# Patient Record
Sex: Male | Born: 1950
Health system: Southern US, Community
[De-identification: ages and names within clinical notes are randomized; demographics above are authoritative.]

## PROBLEM LIST (undated history)

## (undated) DIAGNOSIS — I25110 Atherosclerotic heart disease of native coronary artery with unstable angina pectoris: Secondary | ICD-10-CM

## (undated) DIAGNOSIS — E119 Type 2 diabetes mellitus without complications: Secondary | ICD-10-CM

## (undated) DIAGNOSIS — Z9289 Personal history of other medical treatment: Secondary | ICD-10-CM

## (undated) DIAGNOSIS — E782 Mixed hyperlipidemia: Secondary | ICD-10-CM

## (undated) DIAGNOSIS — E034 Atrophy of thyroid (acquired): Secondary | ICD-10-CM

## (undated) DIAGNOSIS — Z87448 Personal history of other diseases of urinary system: Secondary | ICD-10-CM

## (undated) DIAGNOSIS — F32A Depression, unspecified: Secondary | ICD-10-CM

## (undated) DIAGNOSIS — E039 Hypothyroidism, unspecified: Secondary | ICD-10-CM

## (undated) DIAGNOSIS — M1990 Unspecified osteoarthritis, unspecified site: Secondary | ICD-10-CM

## (undated) DIAGNOSIS — E1122 Type 2 diabetes mellitus with diabetic chronic kidney disease: Secondary | ICD-10-CM

## (undated) DIAGNOSIS — I1311 Hypertensive heart and chronic kidney disease without heart failure, with stage 5 chronic kidney disease, or end stage renal disease: Secondary | ICD-10-CM

## (undated) DIAGNOSIS — Z794 Long term (current) use of insulin: Secondary | ICD-10-CM

## (undated) DIAGNOSIS — C801 Malignant (primary) neoplasm, unspecified: Secondary | ICD-10-CM

## (undated) DIAGNOSIS — Z940 Kidney transplant status: Secondary | ICD-10-CM

## (undated) HISTORY — DX: Long term (current) use of insulin: Z79.4

## (undated) HISTORY — DX: Hypertensive heart and chronic kidney disease without heart failure, with stage 5 chronic kidney disease, or end stage renal disease: I13.11

## (undated) HISTORY — DX: Mixed hyperlipidemia: E78.2

## (undated) HISTORY — PX: KIDNEY TRANSPLANT: SHX239

## (undated) HISTORY — PX: CATARACT EXTRACTION: SUR2

## (undated) HISTORY — DX: Atherosclerotic heart disease of native coronary artery with unstable angina pectoris: I25.110

## (undated) HISTORY — PX: CORONARY ARTERY BYPASS GRAFT: SHX141

---
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
## 2010-10-09 DIAGNOSIS — Z85828 Personal history of other malignant neoplasm of skin: Secondary | ICD-10-CM

## 2010-10-09 HISTORY — DX: Personal history of other malignant neoplasm of skin: Z85.828

## 2010-11-16 DIAGNOSIS — C44221 Squamous cell carcinoma of skin of unspecified ear and external auricular canal: Secondary | ICD-10-CM

## 2010-11-16 HISTORY — DX: Squamous cell carcinoma of skin of unspecified ear and external auricular canal: C44.221

## 2015-05-24 DIAGNOSIS — I1311 Hypertensive heart and chronic kidney disease without heart failure, with stage 5 chronic kidney disease, or end stage renal disease: Secondary | ICD-10-CM | POA: Diagnosis not present

## 2015-05-24 DIAGNOSIS — Z940 Kidney transplant status: Secondary | ICD-10-CM | POA: Diagnosis not present

## 2015-05-24 DIAGNOSIS — E034 Atrophy of thyroid (acquired): Secondary | ICD-10-CM | POA: Diagnosis not present

## 2015-05-24 DIAGNOSIS — E782 Mixed hyperlipidemia: Secondary | ICD-10-CM | POA: Diagnosis not present

## 2015-05-24 DIAGNOSIS — E1122 Type 2 diabetes mellitus with diabetic chronic kidney disease: Secondary | ICD-10-CM | POA: Diagnosis not present

## 2015-05-24 DIAGNOSIS — N186 End stage renal disease: Secondary | ICD-10-CM | POA: Diagnosis not present

## 2015-05-24 DIAGNOSIS — Z794 Long term (current) use of insulin: Secondary | ICD-10-CM | POA: Diagnosis not present

## 2015-05-24 DIAGNOSIS — I25110 Atherosclerotic heart disease of native coronary artery with unstable angina pectoris: Secondary | ICD-10-CM | POA: Diagnosis not present

## 2015-06-22 DIAGNOSIS — N186 End stage renal disease: Secondary | ICD-10-CM | POA: Diagnosis not present

## 2015-06-22 DIAGNOSIS — D649 Anemia, unspecified: Secondary | ICD-10-CM | POA: Diagnosis not present

## 2015-06-22 DIAGNOSIS — Z79899 Other long term (current) drug therapy: Secondary | ICD-10-CM | POA: Diagnosis not present

## 2015-06-22 DIAGNOSIS — N250 Renal osteodystrophy: Secondary | ICD-10-CM | POA: Diagnosis not present

## 2015-06-22 DIAGNOSIS — Z940 Kidney transplant status: Secondary | ICD-10-CM | POA: Diagnosis not present

## 2015-06-22 DIAGNOSIS — R309 Painful micturition, unspecified: Secondary | ICD-10-CM | POA: Diagnosis not present

## 2015-06-22 DIAGNOSIS — E872 Acidosis: Secondary | ICD-10-CM | POA: Diagnosis not present

## 2015-06-22 DIAGNOSIS — E211 Secondary hyperparathyroidism, not elsewhere classified: Secondary | ICD-10-CM | POA: Diagnosis not present

## 2015-06-22 DIAGNOSIS — E785 Hyperlipidemia, unspecified: Secondary | ICD-10-CM | POA: Diagnosis not present

## 2015-06-22 DIAGNOSIS — E1169 Type 2 diabetes mellitus with other specified complication: Secondary | ICD-10-CM | POA: Diagnosis not present

## 2015-06-22 DIAGNOSIS — R809 Proteinuria, unspecified: Secondary | ICD-10-CM | POA: Diagnosis not present

## 2015-10-24 DIAGNOSIS — E1169 Type 2 diabetes mellitus with other specified complication: Secondary | ICD-10-CM | POA: Diagnosis not present

## 2015-10-24 DIAGNOSIS — E211 Secondary hyperparathyroidism, not elsewhere classified: Secondary | ICD-10-CM | POA: Diagnosis not present

## 2015-10-24 DIAGNOSIS — N186 End stage renal disease: Secondary | ICD-10-CM | POA: Diagnosis not present

## 2015-10-24 DIAGNOSIS — E785 Hyperlipidemia, unspecified: Secondary | ICD-10-CM | POA: Diagnosis not present

## 2015-10-24 DIAGNOSIS — D649 Anemia, unspecified: Secondary | ICD-10-CM | POA: Diagnosis not present

## 2015-10-24 DIAGNOSIS — E559 Vitamin D deficiency, unspecified: Secondary | ICD-10-CM | POA: Diagnosis not present

## 2015-10-24 DIAGNOSIS — E872 Acidosis: Secondary | ICD-10-CM | POA: Diagnosis not present

## 2015-10-24 DIAGNOSIS — R809 Proteinuria, unspecified: Secondary | ICD-10-CM | POA: Diagnosis not present

## 2015-10-24 DIAGNOSIS — N189 Chronic kidney disease, unspecified: Secondary | ICD-10-CM | POA: Diagnosis not present

## 2015-10-24 DIAGNOSIS — Z940 Kidney transplant status: Secondary | ICD-10-CM | POA: Diagnosis not present

## 2015-10-24 DIAGNOSIS — E119 Type 2 diabetes mellitus without complications: Secondary | ICD-10-CM | POA: Diagnosis not present

## 2015-10-24 DIAGNOSIS — N250 Renal osteodystrophy: Secondary | ICD-10-CM | POA: Diagnosis not present

## 2015-11-24 DIAGNOSIS — I1311 Hypertensive heart and chronic kidney disease without heart failure, with stage 5 chronic kidney disease, or end stage renal disease: Secondary | ICD-10-CM | POA: Diagnosis not present

## 2015-11-24 DIAGNOSIS — I25110 Atherosclerotic heart disease of native coronary artery with unstable angina pectoris: Secondary | ICD-10-CM | POA: Diagnosis not present

## 2015-11-24 DIAGNOSIS — Z940 Kidney transplant status: Secondary | ICD-10-CM | POA: Diagnosis not present

## 2015-11-24 DIAGNOSIS — N186 End stage renal disease: Secondary | ICD-10-CM | POA: Diagnosis not present

## 2015-11-24 DIAGNOSIS — Z794 Long term (current) use of insulin: Secondary | ICD-10-CM | POA: Diagnosis not present

## 2015-11-24 DIAGNOSIS — E1122 Type 2 diabetes mellitus with diabetic chronic kidney disease: Secondary | ICD-10-CM | POA: Diagnosis not present

## 2015-11-24 DIAGNOSIS — E782 Mixed hyperlipidemia: Secondary | ICD-10-CM | POA: Diagnosis not present

## 2015-11-24 DIAGNOSIS — E034 Atrophy of thyroid (acquired): Secondary | ICD-10-CM | POA: Diagnosis not present

## 2015-12-08 DIAGNOSIS — L821 Other seborrheic keratosis: Secondary | ICD-10-CM | POA: Diagnosis not present

## 2015-12-08 DIAGNOSIS — L578 Other skin changes due to chronic exposure to nonionizing radiation: Secondary | ICD-10-CM | POA: Diagnosis not present

## 2015-12-08 DIAGNOSIS — L570 Actinic keratosis: Secondary | ICD-10-CM | POA: Diagnosis not present

## 2015-12-08 DIAGNOSIS — C44622 Squamous cell carcinoma of skin of right upper limb, including shoulder: Secondary | ICD-10-CM | POA: Diagnosis not present

## 2016-01-31 DIAGNOSIS — Q605 Renal hypoplasia, unspecified: Secondary | ICD-10-CM

## 2016-01-31 HISTORY — DX: Renal hypoplasia, unspecified: Q60.5

## 2016-02-08 DIAGNOSIS — Z951 Presence of aortocoronary bypass graft: Secondary | ICD-10-CM | POA: Insufficient documentation

## 2016-02-08 DIAGNOSIS — F17200 Nicotine dependence, unspecified, uncomplicated: Secondary | ICD-10-CM | POA: Insufficient documentation

## 2016-02-08 DIAGNOSIS — E1169 Type 2 diabetes mellitus with other specified complication: Secondary | ICD-10-CM | POA: Diagnosis not present

## 2016-02-08 DIAGNOSIS — E039 Hypothyroidism, unspecified: Secondary | ICD-10-CM | POA: Diagnosis not present

## 2016-02-08 DIAGNOSIS — Z940 Kidney transplant status: Secondary | ICD-10-CM | POA: Diagnosis not present

## 2016-02-08 DIAGNOSIS — E559 Vitamin D deficiency, unspecified: Secondary | ICD-10-CM

## 2016-02-08 DIAGNOSIS — E099 Drug or chemical induced diabetes mellitus without complications: Secondary | ICD-10-CM | POA: Diagnosis not present

## 2016-02-08 DIAGNOSIS — E785 Hyperlipidemia, unspecified: Secondary | ICD-10-CM | POA: Diagnosis not present

## 2016-02-08 DIAGNOSIS — I2510 Atherosclerotic heart disease of native coronary artery without angina pectoris: Secondary | ICD-10-CM | POA: Insufficient documentation

## 2016-02-08 HISTORY — DX: Presence of aortocoronary bypass graft: Z95.1

## 2016-02-08 HISTORY — DX: Atherosclerotic heart disease of native coronary artery without angina pectoris: I25.10

## 2016-02-08 HISTORY — DX: Vitamin D deficiency, unspecified: E55.9

## 2016-02-27 DIAGNOSIS — E785 Hyperlipidemia, unspecified: Secondary | ICD-10-CM | POA: Diagnosis not present

## 2016-02-27 DIAGNOSIS — E7800 Pure hypercholesterolemia, unspecified: Secondary | ICD-10-CM | POA: Diagnosis not present

## 2016-02-27 DIAGNOSIS — Z940 Kidney transplant status: Secondary | ICD-10-CM | POA: Diagnosis not present

## 2016-02-27 DIAGNOSIS — E1169 Type 2 diabetes mellitus with other specified complication: Secondary | ICD-10-CM | POA: Diagnosis not present

## 2016-02-27 DIAGNOSIS — N186 End stage renal disease: Secondary | ICD-10-CM | POA: Diagnosis not present

## 2016-02-27 DIAGNOSIS — Z79899 Other long term (current) drug therapy: Secondary | ICD-10-CM | POA: Diagnosis not present

## 2016-02-27 DIAGNOSIS — E872 Acidosis: Secondary | ICD-10-CM | POA: Diagnosis not present

## 2016-02-27 DIAGNOSIS — D649 Anemia, unspecified: Secondary | ICD-10-CM | POA: Diagnosis not present

## 2016-02-27 DIAGNOSIS — N250 Renal osteodystrophy: Secondary | ICD-10-CM | POA: Diagnosis not present

## 2016-02-27 DIAGNOSIS — E559 Vitamin D deficiency, unspecified: Secondary | ICD-10-CM | POA: Diagnosis not present

## 2016-02-27 DIAGNOSIS — N189 Chronic kidney disease, unspecified: Secondary | ICD-10-CM | POA: Diagnosis not present

## 2016-02-27 DIAGNOSIS — R809 Proteinuria, unspecified: Secondary | ICD-10-CM | POA: Diagnosis not present

## 2016-02-27 DIAGNOSIS — R309 Painful micturition, unspecified: Secondary | ICD-10-CM | POA: Diagnosis not present

## 2016-03-28 DIAGNOSIS — Z9225 Personal history of immunosupression therapy: Secondary | ICD-10-CM | POA: Diagnosis not present

## 2016-03-28 DIAGNOSIS — Z79899 Other long term (current) drug therapy: Secondary | ICD-10-CM | POA: Diagnosis not present

## 2016-03-28 DIAGNOSIS — L578 Other skin changes due to chronic exposure to nonionizing radiation: Secondary | ICD-10-CM | POA: Diagnosis not present

## 2016-03-28 DIAGNOSIS — L570 Actinic keratosis: Secondary | ICD-10-CM | POA: Diagnosis not present

## 2016-03-28 DIAGNOSIS — L905 Scar conditions and fibrosis of skin: Secondary | ICD-10-CM | POA: Diagnosis not present

## 2016-05-23 DIAGNOSIS — E782 Mixed hyperlipidemia: Secondary | ICD-10-CM | POA: Diagnosis not present

## 2016-05-23 DIAGNOSIS — N186 End stage renal disease: Secondary | ICD-10-CM | POA: Diagnosis not present

## 2016-05-23 DIAGNOSIS — E034 Atrophy of thyroid (acquired): Secondary | ICD-10-CM | POA: Diagnosis not present

## 2016-05-23 DIAGNOSIS — E1122 Type 2 diabetes mellitus with diabetic chronic kidney disease: Secondary | ICD-10-CM | POA: Diagnosis not present

## 2016-05-23 DIAGNOSIS — I1311 Hypertensive heart and chronic kidney disease without heart failure, with stage 5 chronic kidney disease, or end stage renal disease: Secondary | ICD-10-CM | POA: Diagnosis not present

## 2016-05-23 DIAGNOSIS — I25110 Atherosclerotic heart disease of native coronary artery with unstable angina pectoris: Secondary | ICD-10-CM | POA: Diagnosis not present

## 2016-05-23 DIAGNOSIS — Z794 Long term (current) use of insulin: Secondary | ICD-10-CM | POA: Diagnosis not present

## 2016-05-23 DIAGNOSIS — Z940 Kidney transplant status: Secondary | ICD-10-CM | POA: Diagnosis not present

## 2016-06-11 DIAGNOSIS — I2510 Atherosclerotic heart disease of native coronary artery without angina pectoris: Secondary | ICD-10-CM | POA: Diagnosis not present

## 2016-06-11 DIAGNOSIS — E039 Hypothyroidism, unspecified: Secondary | ICD-10-CM | POA: Diagnosis not present

## 2016-06-11 DIAGNOSIS — Z951 Presence of aortocoronary bypass graft: Secondary | ICD-10-CM | POA: Diagnosis not present

## 2016-06-11 DIAGNOSIS — Z940 Kidney transplant status: Secondary | ICD-10-CM | POA: Diagnosis not present

## 2016-06-11 DIAGNOSIS — E785 Hyperlipidemia, unspecified: Secondary | ICD-10-CM | POA: Diagnosis not present

## 2016-06-11 DIAGNOSIS — E1169 Type 2 diabetes mellitus with other specified complication: Secondary | ICD-10-CM | POA: Diagnosis not present

## 2016-06-11 DIAGNOSIS — F17200 Nicotine dependence, unspecified, uncomplicated: Secondary | ICD-10-CM | POA: Diagnosis not present

## 2016-06-11 DIAGNOSIS — E099 Drug or chemical induced diabetes mellitus without complications: Secondary | ICD-10-CM | POA: Diagnosis not present

## 2016-07-02 DIAGNOSIS — R309 Painful micturition, unspecified: Secondary | ICD-10-CM | POA: Diagnosis not present

## 2016-07-02 DIAGNOSIS — Z79899 Other long term (current) drug therapy: Secondary | ICD-10-CM | POA: Diagnosis not present

## 2016-07-02 DIAGNOSIS — E785 Hyperlipidemia, unspecified: Secondary | ICD-10-CM | POA: Diagnosis not present

## 2016-07-02 DIAGNOSIS — R809 Proteinuria, unspecified: Secondary | ICD-10-CM | POA: Diagnosis not present

## 2016-07-02 DIAGNOSIS — N250 Renal osteodystrophy: Secondary | ICD-10-CM | POA: Diagnosis not present

## 2016-07-02 DIAGNOSIS — N186 End stage renal disease: Secondary | ICD-10-CM | POA: Diagnosis not present

## 2016-07-02 DIAGNOSIS — Z940 Kidney transplant status: Secondary | ICD-10-CM | POA: Diagnosis not present

## 2016-07-02 DIAGNOSIS — E559 Vitamin D deficiency, unspecified: Secondary | ICD-10-CM | POA: Diagnosis not present

## 2016-07-02 DIAGNOSIS — E1169 Type 2 diabetes mellitus with other specified complication: Secondary | ICD-10-CM | POA: Diagnosis not present

## 2016-07-02 DIAGNOSIS — D649 Anemia, unspecified: Secondary | ICD-10-CM | POA: Diagnosis not present

## 2016-07-02 DIAGNOSIS — E872 Acidosis: Secondary | ICD-10-CM | POA: Diagnosis not present

## 2016-07-30 DIAGNOSIS — Z9225 Personal history of immunosupression therapy: Secondary | ICD-10-CM | POA: Diagnosis not present

## 2016-07-30 DIAGNOSIS — L578 Other skin changes due to chronic exposure to nonionizing radiation: Secondary | ICD-10-CM | POA: Diagnosis not present

## 2016-07-30 DIAGNOSIS — Z85828 Personal history of other malignant neoplasm of skin: Secondary | ICD-10-CM | POA: Diagnosis not present

## 2016-07-30 DIAGNOSIS — L570 Actinic keratosis: Secondary | ICD-10-CM | POA: Diagnosis not present

## 2016-09-10 DIAGNOSIS — L570 Actinic keratosis: Secondary | ICD-10-CM | POA: Diagnosis not present

## 2016-10-15 DIAGNOSIS — Z85828 Personal history of other malignant neoplasm of skin: Secondary | ICD-10-CM | POA: Diagnosis not present

## 2016-10-15 DIAGNOSIS — Z940 Kidney transplant status: Secondary | ICD-10-CM | POA: Diagnosis not present

## 2016-10-15 DIAGNOSIS — E039 Hypothyroidism, unspecified: Secondary | ICD-10-CM | POA: Diagnosis not present

## 2016-10-15 DIAGNOSIS — L574 Cutis laxa senilis: Secondary | ICD-10-CM | POA: Diagnosis not present

## 2016-10-15 DIAGNOSIS — I2510 Atherosclerotic heart disease of native coronary artery without angina pectoris: Secondary | ICD-10-CM | POA: Diagnosis not present

## 2016-10-15 DIAGNOSIS — F17200 Nicotine dependence, unspecified, uncomplicated: Secondary | ICD-10-CM | POA: Diagnosis not present

## 2016-10-15 DIAGNOSIS — E785 Hyperlipidemia, unspecified: Secondary | ICD-10-CM | POA: Diagnosis not present

## 2016-10-15 DIAGNOSIS — E1169 Type 2 diabetes mellitus with other specified complication: Secondary | ICD-10-CM | POA: Diagnosis not present

## 2016-10-15 DIAGNOSIS — E1165 Type 2 diabetes mellitus with hyperglycemia: Secondary | ICD-10-CM | POA: Diagnosis not present

## 2016-10-15 DIAGNOSIS — Z08 Encounter for follow-up examination after completed treatment for malignant neoplasm: Secondary | ICD-10-CM | POA: Diagnosis not present

## 2016-10-15 DIAGNOSIS — L905 Scar conditions and fibrosis of skin: Secondary | ICD-10-CM | POA: Diagnosis not present

## 2016-11-25 DIAGNOSIS — I1311 Hypertensive heart and chronic kidney disease without heart failure, with stage 5 chronic kidney disease, or end stage renal disease: Secondary | ICD-10-CM | POA: Diagnosis not present

## 2016-11-25 DIAGNOSIS — Z940 Kidney transplant status: Secondary | ICD-10-CM | POA: Diagnosis not present

## 2016-11-25 DIAGNOSIS — I25110 Atherosclerotic heart disease of native coronary artery with unstable angina pectoris: Secondary | ICD-10-CM | POA: Diagnosis not present

## 2016-11-25 DIAGNOSIS — N186 End stage renal disease: Secondary | ICD-10-CM | POA: Diagnosis not present

## 2016-11-25 DIAGNOSIS — Z794 Long term (current) use of insulin: Secondary | ICD-10-CM | POA: Diagnosis not present

## 2016-11-25 DIAGNOSIS — E1122 Type 2 diabetes mellitus with diabetic chronic kidney disease: Secondary | ICD-10-CM | POA: Diagnosis not present

## 2016-11-25 DIAGNOSIS — E034 Atrophy of thyroid (acquired): Secondary | ICD-10-CM | POA: Diagnosis not present

## 2016-11-25 DIAGNOSIS — E782 Mixed hyperlipidemia: Secondary | ICD-10-CM | POA: Diagnosis not present

## 2016-12-17 DIAGNOSIS — E1169 Type 2 diabetes mellitus with other specified complication: Secondary | ICD-10-CM | POA: Diagnosis not present

## 2016-12-17 DIAGNOSIS — Z940 Kidney transplant status: Secondary | ICD-10-CM | POA: Diagnosis not present

## 2016-12-17 DIAGNOSIS — N186 End stage renal disease: Secondary | ICD-10-CM | POA: Diagnosis not present

## 2016-12-17 DIAGNOSIS — E872 Acidosis: Secondary | ICD-10-CM | POA: Diagnosis not present

## 2016-12-17 DIAGNOSIS — N250 Renal osteodystrophy: Secondary | ICD-10-CM | POA: Diagnosis not present

## 2016-12-17 DIAGNOSIS — E785 Hyperlipidemia, unspecified: Secondary | ICD-10-CM | POA: Diagnosis not present

## 2017-01-06 DIAGNOSIS — Z0000 Encounter for general adult medical examination without abnormal findings: Secondary | ICD-10-CM | POA: Diagnosis not present

## 2017-01-06 DIAGNOSIS — Z6828 Body mass index (BMI) 28.0-28.9, adult: Secondary | ICD-10-CM | POA: Diagnosis not present

## 2017-01-13 DIAGNOSIS — Z1211 Encounter for screening for malignant neoplasm of colon: Secondary | ICD-10-CM | POA: Diagnosis not present

## 2017-01-13 DIAGNOSIS — Z1212 Encounter for screening for malignant neoplasm of rectum: Secondary | ICD-10-CM | POA: Diagnosis not present

## 2017-01-14 DIAGNOSIS — F17200 Nicotine dependence, unspecified, uncomplicated: Secondary | ICD-10-CM | POA: Diagnosis not present

## 2017-01-14 DIAGNOSIS — I2510 Atherosclerotic heart disease of native coronary artery without angina pectoris: Secondary | ICD-10-CM | POA: Diagnosis not present

## 2017-01-14 DIAGNOSIS — C44629 Squamous cell carcinoma of skin of left upper limb, including shoulder: Secondary | ICD-10-CM | POA: Diagnosis not present

## 2017-01-14 DIAGNOSIS — L578 Other skin changes due to chronic exposure to nonionizing radiation: Secondary | ICD-10-CM | POA: Diagnosis not present

## 2017-01-14 DIAGNOSIS — E1169 Type 2 diabetes mellitus with other specified complication: Secondary | ICD-10-CM | POA: Diagnosis not present

## 2017-01-14 DIAGNOSIS — Z940 Kidney transplant status: Secondary | ICD-10-CM | POA: Diagnosis not present

## 2017-01-14 DIAGNOSIS — Z951 Presence of aortocoronary bypass graft: Secondary | ICD-10-CM | POA: Diagnosis not present

## 2017-01-14 DIAGNOSIS — E039 Hypothyroidism, unspecified: Secondary | ICD-10-CM | POA: Diagnosis not present

## 2017-01-14 DIAGNOSIS — E1165 Type 2 diabetes mellitus with hyperglycemia: Secondary | ICD-10-CM | POA: Diagnosis not present

## 2017-01-14 DIAGNOSIS — D485 Neoplasm of uncertain behavior of skin: Secondary | ICD-10-CM | POA: Diagnosis not present

## 2017-01-14 DIAGNOSIS — L570 Actinic keratosis: Secondary | ICD-10-CM | POA: Diagnosis not present

## 2017-01-14 DIAGNOSIS — E785 Hyperlipidemia, unspecified: Secondary | ICD-10-CM | POA: Diagnosis not present

## 2017-01-31 DIAGNOSIS — E1165 Type 2 diabetes mellitus with hyperglycemia: Secondary | ICD-10-CM | POA: Diagnosis not present

## 2017-03-12 DIAGNOSIS — E1165 Type 2 diabetes mellitus with hyperglycemia: Secondary | ICD-10-CM | POA: Diagnosis not present

## 2017-04-17 DIAGNOSIS — E1165 Type 2 diabetes mellitus with hyperglycemia: Secondary | ICD-10-CM | POA: Diagnosis not present

## 2017-04-17 DIAGNOSIS — E1169 Type 2 diabetes mellitus with other specified complication: Secondary | ICD-10-CM | POA: Diagnosis not present

## 2017-04-17 DIAGNOSIS — E785 Hyperlipidemia, unspecified: Secondary | ICD-10-CM | POA: Diagnosis not present

## 2017-04-17 DIAGNOSIS — Z940 Kidney transplant status: Secondary | ICD-10-CM | POA: Diagnosis not present

## 2017-04-17 DIAGNOSIS — E039 Hypothyroidism, unspecified: Secondary | ICD-10-CM | POA: Diagnosis not present

## 2017-04-17 DIAGNOSIS — I2510 Atherosclerotic heart disease of native coronary artery without angina pectoris: Secondary | ICD-10-CM | POA: Diagnosis not present

## 2017-04-17 DIAGNOSIS — F17200 Nicotine dependence, unspecified, uncomplicated: Secondary | ICD-10-CM | POA: Diagnosis not present

## 2017-05-26 DIAGNOSIS — E034 Atrophy of thyroid (acquired): Secondary | ICD-10-CM | POA: Diagnosis not present

## 2017-05-26 DIAGNOSIS — Z940 Kidney transplant status: Secondary | ICD-10-CM | POA: Diagnosis not present

## 2017-05-26 DIAGNOSIS — E782 Mixed hyperlipidemia: Secondary | ICD-10-CM | POA: Diagnosis not present

## 2017-05-26 DIAGNOSIS — I1311 Hypertensive heart and chronic kidney disease without heart failure, with stage 5 chronic kidney disease, or end stage renal disease: Secondary | ICD-10-CM | POA: Diagnosis not present

## 2017-05-26 DIAGNOSIS — I25110 Atherosclerotic heart disease of native coronary artery with unstable angina pectoris: Secondary | ICD-10-CM | POA: Diagnosis not present

## 2017-05-26 DIAGNOSIS — E1122 Type 2 diabetes mellitus with diabetic chronic kidney disease: Secondary | ICD-10-CM | POA: Diagnosis not present

## 2017-05-26 DIAGNOSIS — Z125 Encounter for screening for malignant neoplasm of prostate: Secondary | ICD-10-CM | POA: Diagnosis not present

## 2017-05-26 DIAGNOSIS — Z794 Long term (current) use of insulin: Secondary | ICD-10-CM | POA: Diagnosis not present

## 2017-05-26 DIAGNOSIS — N186 End stage renal disease: Secondary | ICD-10-CM | POA: Diagnosis not present

## 2017-07-08 DIAGNOSIS — Z940 Kidney transplant status: Secondary | ICD-10-CM

## 2017-07-08 DIAGNOSIS — R079 Chest pain, unspecified: Secondary | ICD-10-CM | POA: Diagnosis not present

## 2017-07-08 HISTORY — DX: Kidney transplant status: Z94.0

## 2017-07-16 DIAGNOSIS — E211 Secondary hyperparathyroidism, not elsewhere classified: Secondary | ICD-10-CM | POA: Diagnosis not present

## 2017-07-16 DIAGNOSIS — E872 Acidosis: Secondary | ICD-10-CM | POA: Diagnosis not present

## 2017-07-16 DIAGNOSIS — D649 Anemia, unspecified: Secondary | ICD-10-CM | POA: Diagnosis not present

## 2017-07-16 DIAGNOSIS — N189 Chronic kidney disease, unspecified: Secondary | ICD-10-CM | POA: Diagnosis not present

## 2017-07-16 DIAGNOSIS — E785 Hyperlipidemia, unspecified: Secondary | ICD-10-CM | POA: Diagnosis not present

## 2017-07-16 DIAGNOSIS — R309 Painful micturition, unspecified: Secondary | ICD-10-CM | POA: Diagnosis not present

## 2017-07-16 DIAGNOSIS — E1169 Type 2 diabetes mellitus with other specified complication: Secondary | ICD-10-CM | POA: Diagnosis not present

## 2017-07-16 DIAGNOSIS — R809 Proteinuria, unspecified: Secondary | ICD-10-CM | POA: Diagnosis not present

## 2017-07-16 DIAGNOSIS — Z940 Kidney transplant status: Secondary | ICD-10-CM | POA: Diagnosis not present

## 2017-07-16 DIAGNOSIS — N250 Renal osteodystrophy: Secondary | ICD-10-CM | POA: Diagnosis not present

## 2017-07-16 DIAGNOSIS — E559 Vitamin D deficiency, unspecified: Secondary | ICD-10-CM | POA: Diagnosis not present

## 2017-07-16 DIAGNOSIS — N186 End stage renal disease: Secondary | ICD-10-CM | POA: Diagnosis not present

## 2017-07-17 DIAGNOSIS — C44629 Squamous cell carcinoma of skin of left upper limb, including shoulder: Secondary | ICD-10-CM | POA: Diagnosis not present

## 2017-07-17 DIAGNOSIS — L570 Actinic keratosis: Secondary | ICD-10-CM | POA: Diagnosis not present

## 2017-07-17 DIAGNOSIS — Z85828 Personal history of other malignant neoplasm of skin: Secondary | ICD-10-CM | POA: Diagnosis not present

## 2017-07-17 DIAGNOSIS — Z08 Encounter for follow-up examination after completed treatment for malignant neoplasm: Secondary | ICD-10-CM | POA: Diagnosis not present

## 2017-07-17 DIAGNOSIS — L578 Other skin changes due to chronic exposure to nonionizing radiation: Secondary | ICD-10-CM | POA: Diagnosis not present

## 2017-07-17 DIAGNOSIS — D0462 Carcinoma in situ of skin of left upper limb, including shoulder: Secondary | ICD-10-CM | POA: Diagnosis not present

## 2017-07-17 DIAGNOSIS — L905 Scar conditions and fibrosis of skin: Secondary | ICD-10-CM | POA: Diagnosis not present

## 2017-08-19 DIAGNOSIS — F17210 Nicotine dependence, cigarettes, uncomplicated: Secondary | ICD-10-CM | POA: Diagnosis not present

## 2017-08-19 DIAGNOSIS — E039 Hypothyroidism, unspecified: Secondary | ICD-10-CM | POA: Diagnosis not present

## 2017-08-19 DIAGNOSIS — E1122 Type 2 diabetes mellitus with diabetic chronic kidney disease: Secondary | ICD-10-CM | POA: Diagnosis not present

## 2017-08-19 DIAGNOSIS — E785 Hyperlipidemia, unspecified: Secondary | ICD-10-CM | POA: Diagnosis not present

## 2017-08-19 DIAGNOSIS — Z95820 Peripheral vascular angioplasty status with implants and grafts: Secondary | ICD-10-CM | POA: Diagnosis not present

## 2017-08-19 DIAGNOSIS — N184 Chronic kidney disease, stage 4 (severe): Secondary | ICD-10-CM | POA: Diagnosis not present

## 2017-08-19 DIAGNOSIS — Z794 Long term (current) use of insulin: Secondary | ICD-10-CM | POA: Diagnosis not present

## 2017-10-17 DIAGNOSIS — C44629 Squamous cell carcinoma of skin of left upper limb, including shoulder: Secondary | ICD-10-CM | POA: Diagnosis not present

## 2017-10-17 DIAGNOSIS — D485 Neoplasm of uncertain behavior of skin: Secondary | ICD-10-CM | POA: Diagnosis not present

## 2017-10-17 DIAGNOSIS — C44529 Squamous cell carcinoma of skin of other part of trunk: Secondary | ICD-10-CM | POA: Diagnosis not present

## 2017-10-17 DIAGNOSIS — Z08 Encounter for follow-up examination after completed treatment for malignant neoplasm: Secondary | ICD-10-CM | POA: Diagnosis not present

## 2017-10-17 DIAGNOSIS — L820 Inflamed seborrheic keratosis: Secondary | ICD-10-CM | POA: Diagnosis not present

## 2017-10-17 DIAGNOSIS — Z859 Personal history of malignant neoplasm, unspecified: Secondary | ICD-10-CM | POA: Diagnosis not present

## 2017-10-17 DIAGNOSIS — L570 Actinic keratosis: Secondary | ICD-10-CM | POA: Diagnosis not present

## 2017-10-30 DIAGNOSIS — D0462 Carcinoma in situ of skin of left upper limb, including shoulder: Secondary | ICD-10-CM | POA: Diagnosis not present

## 2017-10-30 DIAGNOSIS — D045 Carcinoma in situ of skin of trunk: Secondary | ICD-10-CM | POA: Diagnosis not present

## 2017-10-30 DIAGNOSIS — C44529 Squamous cell carcinoma of skin of other part of trunk: Secondary | ICD-10-CM | POA: Diagnosis not present

## 2017-10-30 DIAGNOSIS — C44629 Squamous cell carcinoma of skin of left upper limb, including shoulder: Secondary | ICD-10-CM | POA: Diagnosis not present

## 2017-11-26 DIAGNOSIS — Z940 Kidney transplant status: Secondary | ICD-10-CM | POA: Diagnosis not present

## 2017-11-26 DIAGNOSIS — E782 Mixed hyperlipidemia: Secondary | ICD-10-CM | POA: Diagnosis not present

## 2017-11-26 DIAGNOSIS — M65352 Trigger finger, left little finger: Secondary | ICD-10-CM | POA: Diagnosis not present

## 2017-11-26 DIAGNOSIS — I1311 Hypertensive heart and chronic kidney disease without heart failure, with stage 5 chronic kidney disease, or end stage renal disease: Secondary | ICD-10-CM | POA: Diagnosis not present

## 2017-11-26 DIAGNOSIS — M65342 Trigger finger, left ring finger: Secondary | ICD-10-CM | POA: Diagnosis not present

## 2017-11-26 DIAGNOSIS — N186 End stage renal disease: Secondary | ICD-10-CM | POA: Diagnosis not present

## 2017-11-26 DIAGNOSIS — Z794 Long term (current) use of insulin: Secondary | ICD-10-CM | POA: Diagnosis not present

## 2017-11-26 DIAGNOSIS — E1122 Type 2 diabetes mellitus with diabetic chronic kidney disease: Secondary | ICD-10-CM | POA: Diagnosis not present

## 2017-11-26 DIAGNOSIS — I25110 Atherosclerotic heart disease of native coronary artery with unstable angina pectoris: Secondary | ICD-10-CM | POA: Diagnosis not present

## 2017-11-26 DIAGNOSIS — E034 Atrophy of thyroid (acquired): Secondary | ICD-10-CM | POA: Diagnosis not present

## 2017-12-17 DIAGNOSIS — Z794 Long term (current) use of insulin: Secondary | ICD-10-CM | POA: Diagnosis not present

## 2017-12-17 DIAGNOSIS — E1122 Type 2 diabetes mellitus with diabetic chronic kidney disease: Secondary | ICD-10-CM | POA: Diagnosis not present

## 2017-12-17 DIAGNOSIS — N184 Chronic kidney disease, stage 4 (severe): Secondary | ICD-10-CM | POA: Diagnosis not present

## 2017-12-17 DIAGNOSIS — E039 Hypothyroidism, unspecified: Secondary | ICD-10-CM | POA: Diagnosis not present

## 2017-12-17 DIAGNOSIS — F17210 Nicotine dependence, cigarettes, uncomplicated: Secondary | ICD-10-CM | POA: Diagnosis not present

## 2017-12-17 DIAGNOSIS — Z951 Presence of aortocoronary bypass graft: Secondary | ICD-10-CM | POA: Diagnosis not present

## 2017-12-17 DIAGNOSIS — E785 Hyperlipidemia, unspecified: Secondary | ICD-10-CM | POA: Diagnosis not present

## 2017-12-31 DIAGNOSIS — M65352 Trigger finger, left little finger: Secondary | ICD-10-CM | POA: Diagnosis not present

## 2018-01-15 DIAGNOSIS — E559 Vitamin D deficiency, unspecified: Secondary | ICD-10-CM | POA: Diagnosis not present

## 2018-01-15 DIAGNOSIS — N250 Renal osteodystrophy: Secondary | ICD-10-CM | POA: Diagnosis not present

## 2018-01-15 DIAGNOSIS — E785 Hyperlipidemia, unspecified: Secondary | ICD-10-CM | POA: Diagnosis not present

## 2018-01-15 DIAGNOSIS — N186 End stage renal disease: Secondary | ICD-10-CM | POA: Diagnosis not present

## 2018-01-15 DIAGNOSIS — E211 Secondary hyperparathyroidism, not elsewhere classified: Secondary | ICD-10-CM | POA: Diagnosis not present

## 2018-01-15 DIAGNOSIS — E1169 Type 2 diabetes mellitus with other specified complication: Secondary | ICD-10-CM | POA: Diagnosis not present

## 2018-01-15 DIAGNOSIS — Z79899 Other long term (current) drug therapy: Secondary | ICD-10-CM | POA: Diagnosis not present

## 2018-01-15 DIAGNOSIS — E872 Acidosis: Secondary | ICD-10-CM | POA: Diagnosis not present

## 2018-01-15 DIAGNOSIS — Z940 Kidney transplant status: Secondary | ICD-10-CM | POA: Diagnosis not present

## 2018-03-13 DIAGNOSIS — Z79899 Other long term (current) drug therapy: Secondary | ICD-10-CM | POA: Diagnosis not present

## 2018-03-13 DIAGNOSIS — N186 End stage renal disease: Secondary | ICD-10-CM | POA: Diagnosis not present

## 2018-03-13 DIAGNOSIS — Z940 Kidney transplant status: Secondary | ICD-10-CM | POA: Diagnosis not present

## 2018-03-23 DIAGNOSIS — N189 Chronic kidney disease, unspecified: Secondary | ICD-10-CM | POA: Diagnosis not present

## 2018-03-23 DIAGNOSIS — Z940 Kidney transplant status: Secondary | ICD-10-CM | POA: Diagnosis not present

## 2018-04-29 DIAGNOSIS — N184 Chronic kidney disease, stage 4 (severe): Secondary | ICD-10-CM | POA: Diagnosis not present

## 2018-04-29 DIAGNOSIS — Z940 Kidney transplant status: Secondary | ICD-10-CM | POA: Diagnosis not present

## 2018-04-29 DIAGNOSIS — E559 Vitamin D deficiency, unspecified: Secondary | ICD-10-CM | POA: Diagnosis not present

## 2018-04-29 DIAGNOSIS — E119 Type 2 diabetes mellitus without complications: Secondary | ICD-10-CM | POA: Diagnosis not present

## 2018-05-26 DIAGNOSIS — I25110 Atherosclerotic heart disease of native coronary artery with unstable angina pectoris: Secondary | ICD-10-CM | POA: Diagnosis not present

## 2018-05-26 DIAGNOSIS — E034 Atrophy of thyroid (acquired): Secondary | ICD-10-CM | POA: Diagnosis not present

## 2018-05-26 DIAGNOSIS — Z794 Long term (current) use of insulin: Secondary | ICD-10-CM | POA: Diagnosis not present

## 2018-05-26 DIAGNOSIS — E782 Mixed hyperlipidemia: Secondary | ICD-10-CM | POA: Diagnosis not present

## 2018-05-26 DIAGNOSIS — Z940 Kidney transplant status: Secondary | ICD-10-CM | POA: Diagnosis not present

## 2018-05-26 DIAGNOSIS — I1311 Hypertensive heart and chronic kidney disease without heart failure, with stage 5 chronic kidney disease, or end stage renal disease: Secondary | ICD-10-CM | POA: Diagnosis not present

## 2018-05-26 DIAGNOSIS — N186 End stage renal disease: Secondary | ICD-10-CM | POA: Diagnosis not present

## 2018-05-26 DIAGNOSIS — E1122 Type 2 diabetes mellitus with diabetic chronic kidney disease: Secondary | ICD-10-CM | POA: Diagnosis not present

## 2018-07-07 DIAGNOSIS — N186 End stage renal disease: Secondary | ICD-10-CM | POA: Diagnosis not present

## 2018-07-07 DIAGNOSIS — N183 Chronic kidney disease, stage 3 (moderate): Secondary | ICD-10-CM | POA: Diagnosis not present

## 2018-07-07 DIAGNOSIS — Z01818 Encounter for other preprocedural examination: Secondary | ICD-10-CM | POA: Diagnosis not present

## 2018-07-07 DIAGNOSIS — Z940 Kidney transplant status: Secondary | ICD-10-CM | POA: Diagnosis not present

## 2018-07-23 DIAGNOSIS — E785 Hyperlipidemia, unspecified: Secondary | ICD-10-CM | POA: Diagnosis not present

## 2018-07-23 DIAGNOSIS — E1169 Type 2 diabetes mellitus with other specified complication: Secondary | ICD-10-CM | POA: Diagnosis not present

## 2018-07-23 DIAGNOSIS — Z79899 Other long term (current) drug therapy: Secondary | ICD-10-CM | POA: Diagnosis not present

## 2018-07-23 DIAGNOSIS — E872 Acidosis: Secondary | ICD-10-CM | POA: Diagnosis not present

## 2018-07-23 DIAGNOSIS — E559 Vitamin D deficiency, unspecified: Secondary | ICD-10-CM | POA: Diagnosis not present

## 2018-07-23 DIAGNOSIS — E211 Secondary hyperparathyroidism, not elsewhere classified: Secondary | ICD-10-CM | POA: Diagnosis not present

## 2018-07-23 DIAGNOSIS — N186 End stage renal disease: Secondary | ICD-10-CM | POA: Diagnosis not present

## 2018-07-23 DIAGNOSIS — Z940 Kidney transplant status: Secondary | ICD-10-CM | POA: Diagnosis not present

## 2018-07-23 DIAGNOSIS — N250 Renal osteodystrophy: Secondary | ICD-10-CM | POA: Diagnosis not present

## 2018-07-23 DIAGNOSIS — D649 Anemia, unspecified: Secondary | ICD-10-CM | POA: Diagnosis not present

## 2018-08-04 DIAGNOSIS — F17200 Nicotine dependence, unspecified, uncomplicated: Secondary | ICD-10-CM | POA: Diagnosis not present

## 2018-08-04 DIAGNOSIS — Z951 Presence of aortocoronary bypass graft: Secondary | ICD-10-CM | POA: Diagnosis not present

## 2018-08-04 DIAGNOSIS — E119 Type 2 diabetes mellitus without complications: Secondary | ICD-10-CM | POA: Diagnosis not present

## 2018-08-04 DIAGNOSIS — E785 Hyperlipidemia, unspecified: Secondary | ICD-10-CM | POA: Diagnosis not present

## 2018-08-04 DIAGNOSIS — Z940 Kidney transplant status: Secondary | ICD-10-CM | POA: Diagnosis not present

## 2018-08-04 DIAGNOSIS — N184 Chronic kidney disease, stage 4 (severe): Secondary | ICD-10-CM | POA: Diagnosis not present

## 2018-08-04 DIAGNOSIS — E1169 Type 2 diabetes mellitus with other specified complication: Secondary | ICD-10-CM | POA: Diagnosis not present

## 2018-08-04 DIAGNOSIS — I2510 Atherosclerotic heart disease of native coronary artery without angina pectoris: Secondary | ICD-10-CM | POA: Diagnosis not present

## 2018-11-26 DIAGNOSIS — E782 Mixed hyperlipidemia: Secondary | ICD-10-CM | POA: Diagnosis not present

## 2018-11-26 DIAGNOSIS — Z940 Kidney transplant status: Secondary | ICD-10-CM | POA: Diagnosis not present

## 2018-11-26 DIAGNOSIS — I1311 Hypertensive heart and chronic kidney disease without heart failure, with stage 5 chronic kidney disease, or end stage renal disease: Secondary | ICD-10-CM | POA: Diagnosis not present

## 2018-11-26 DIAGNOSIS — E1122 Type 2 diabetes mellitus with diabetic chronic kidney disease: Secondary | ICD-10-CM | POA: Diagnosis not present

## 2018-11-26 DIAGNOSIS — Z794 Long term (current) use of insulin: Secondary | ICD-10-CM | POA: Diagnosis not present

## 2018-11-26 DIAGNOSIS — E034 Atrophy of thyroid (acquired): Secondary | ICD-10-CM | POA: Diagnosis not present

## 2018-11-26 DIAGNOSIS — I25110 Atherosclerotic heart disease of native coronary artery with unstable angina pectoris: Secondary | ICD-10-CM | POA: Diagnosis not present

## 2018-11-26 DIAGNOSIS — N186 End stage renal disease: Secondary | ICD-10-CM | POA: Diagnosis not present

## 2019-01-27 DIAGNOSIS — N186 End stage renal disease: Secondary | ICD-10-CM | POA: Diagnosis not present

## 2019-01-27 DIAGNOSIS — E039 Hypothyroidism, unspecified: Secondary | ICD-10-CM | POA: Diagnosis not present

## 2019-01-27 DIAGNOSIS — E559 Vitamin D deficiency, unspecified: Secondary | ICD-10-CM | POA: Diagnosis not present

## 2019-01-27 DIAGNOSIS — Z940 Kidney transplant status: Secondary | ICD-10-CM | POA: Diagnosis not present

## 2019-01-27 DIAGNOSIS — E785 Hyperlipidemia, unspecified: Secondary | ICD-10-CM | POA: Diagnosis not present

## 2019-01-27 DIAGNOSIS — E1169 Type 2 diabetes mellitus with other specified complication: Secondary | ICD-10-CM | POA: Diagnosis not present

## 2019-01-27 DIAGNOSIS — N250 Renal osteodystrophy: Secondary | ICD-10-CM | POA: Diagnosis not present

## 2019-01-27 DIAGNOSIS — R309 Painful micturition, unspecified: Secondary | ICD-10-CM | POA: Diagnosis not present

## 2019-01-27 DIAGNOSIS — E118 Type 2 diabetes mellitus with unspecified complications: Secondary | ICD-10-CM | POA: Diagnosis not present

## 2019-01-27 DIAGNOSIS — E872 Acidosis: Secondary | ICD-10-CM | POA: Diagnosis not present

## 2019-01-27 DIAGNOSIS — D649 Anemia, unspecified: Secondary | ICD-10-CM | POA: Diagnosis not present

## 2019-01-27 DIAGNOSIS — R809 Proteinuria, unspecified: Secondary | ICD-10-CM | POA: Diagnosis not present

## 2019-01-27 DIAGNOSIS — N189 Chronic kidney disease, unspecified: Secondary | ICD-10-CM | POA: Diagnosis not present

## 2019-01-27 DIAGNOSIS — E211 Secondary hyperparathyroidism, not elsewhere classified: Secondary | ICD-10-CM | POA: Diagnosis not present

## 2019-01-27 DIAGNOSIS — N184 Chronic kidney disease, stage 4 (severe): Secondary | ICD-10-CM | POA: Diagnosis not present

## 2019-04-28 DIAGNOSIS — E039 Hypothyroidism, unspecified: Secondary | ICD-10-CM | POA: Diagnosis not present

## 2019-04-28 DIAGNOSIS — E785 Hyperlipidemia, unspecified: Secondary | ICD-10-CM | POA: Diagnosis not present

## 2019-04-28 DIAGNOSIS — E118 Type 2 diabetes mellitus with unspecified complications: Secondary | ICD-10-CM | POA: Diagnosis not present

## 2019-04-28 DIAGNOSIS — N184 Chronic kidney disease, stage 4 (severe): Secondary | ICD-10-CM | POA: Diagnosis not present

## 2019-06-04 DIAGNOSIS — E1122 Type 2 diabetes mellitus with diabetic chronic kidney disease: Secondary | ICD-10-CM | POA: Diagnosis not present

## 2019-06-04 DIAGNOSIS — I1311 Hypertensive heart and chronic kidney disease without heart failure, with stage 5 chronic kidney disease, or end stage renal disease: Secondary | ICD-10-CM | POA: Diagnosis not present

## 2019-06-04 DIAGNOSIS — E782 Mixed hyperlipidemia: Secondary | ICD-10-CM | POA: Diagnosis not present

## 2019-06-04 DIAGNOSIS — E034 Atrophy of thyroid (acquired): Secondary | ICD-10-CM | POA: Diagnosis not present

## 2019-06-04 DIAGNOSIS — Z125 Encounter for screening for malignant neoplasm of prostate: Secondary | ICD-10-CM | POA: Diagnosis not present

## 2019-06-04 DIAGNOSIS — N186 End stage renal disease: Secondary | ICD-10-CM | POA: Diagnosis not present

## 2019-06-04 DIAGNOSIS — Z13820 Encounter for screening for osteoporosis: Secondary | ICD-10-CM | POA: Diagnosis not present

## 2019-06-04 DIAGNOSIS — Z0000 Encounter for general adult medical examination without abnormal findings: Secondary | ICD-10-CM | POA: Diagnosis not present

## 2019-06-04 DIAGNOSIS — Z940 Kidney transplant status: Secondary | ICD-10-CM | POA: Diagnosis not present

## 2019-06-04 DIAGNOSIS — Z6829 Body mass index (BMI) 29.0-29.9, adult: Secondary | ICD-10-CM | POA: Diagnosis not present

## 2019-07-14 DIAGNOSIS — N250 Renal osteodystrophy: Secondary | ICD-10-CM | POA: Diagnosis not present

## 2019-07-14 DIAGNOSIS — R309 Painful micturition, unspecified: Secondary | ICD-10-CM | POA: Diagnosis not present

## 2019-07-14 DIAGNOSIS — E211 Secondary hyperparathyroidism, not elsewhere classified: Secondary | ICD-10-CM | POA: Diagnosis not present

## 2019-07-14 DIAGNOSIS — E559 Vitamin D deficiency, unspecified: Secondary | ICD-10-CM | POA: Diagnosis not present

## 2019-07-14 DIAGNOSIS — N186 End stage renal disease: Secondary | ICD-10-CM | POA: Diagnosis not present

## 2019-07-14 DIAGNOSIS — E785 Hyperlipidemia, unspecified: Secondary | ICD-10-CM | POA: Diagnosis not present

## 2019-07-14 DIAGNOSIS — Z940 Kidney transplant status: Secondary | ICD-10-CM | POA: Diagnosis not present

## 2019-07-14 DIAGNOSIS — E1169 Type 2 diabetes mellitus with other specified complication: Secondary | ICD-10-CM | POA: Diagnosis not present

## 2019-07-14 DIAGNOSIS — D649 Anemia, unspecified: Secondary | ICD-10-CM | POA: Diagnosis not present

## 2019-07-14 DIAGNOSIS — N189 Chronic kidney disease, unspecified: Secondary | ICD-10-CM | POA: Diagnosis not present

## 2019-07-14 DIAGNOSIS — Z79899 Other long term (current) drug therapy: Secondary | ICD-10-CM | POA: Diagnosis not present

## 2019-07-14 DIAGNOSIS — E872 Acidosis: Secondary | ICD-10-CM | POA: Diagnosis not present

## 2019-07-14 DIAGNOSIS — R809 Proteinuria, unspecified: Secondary | ICD-10-CM | POA: Diagnosis not present

## 2019-10-08 HISTORY — PX: EXTERNAL EAR SURGERY: SHX627

## 2019-11-01 DIAGNOSIS — E785 Hyperlipidemia, unspecified: Secondary | ICD-10-CM | POA: Diagnosis not present

## 2019-11-01 DIAGNOSIS — N184 Chronic kidney disease, stage 4 (severe): Secondary | ICD-10-CM | POA: Diagnosis not present

## 2019-11-01 DIAGNOSIS — E039 Hypothyroidism, unspecified: Secondary | ICD-10-CM | POA: Diagnosis not present

## 2019-11-01 DIAGNOSIS — E118 Type 2 diabetes mellitus with unspecified complications: Secondary | ICD-10-CM | POA: Diagnosis not present

## 2019-11-30 ENCOUNTER — Other Ambulatory Visit: Payer: Self-pay

## 2019-11-30 MED ORDER — AZATHIOPRINE 50 MG PO TABS: 125.0000 mg | ORAL_TABLET | Freq: Every day | ORAL | 0 refills | Status: DC

## 2019-12-06 ENCOUNTER — Other Ambulatory Visit: Payer: Self-pay

## 2019-12-06 ENCOUNTER — Ambulatory Visit (INDEPENDENT_AMBULATORY_CARE_PROVIDER_SITE_OTHER): Payer: Medicare Other | Admitting: Nurse Practitioner

## 2019-12-06 ENCOUNTER — Encounter: Payer: Self-pay | Admitting: Nurse Practitioner

## 2019-12-06 VITALS — BP 122/62 | HR 100 | Temp 97.5°F | Resp 18 | Ht 64.0 in | Wt 166.0 lb

## 2019-12-06 DIAGNOSIS — Z940 Kidney transplant status: Secondary | ICD-10-CM | POA: Diagnosis not present

## 2019-12-06 DIAGNOSIS — E782 Mixed hyperlipidemia: Secondary | ICD-10-CM | POA: Diagnosis not present

## 2019-12-06 DIAGNOSIS — K219 Gastro-esophageal reflux disease without esophagitis: Secondary | ICD-10-CM

## 2019-12-06 DIAGNOSIS — E1122 Type 2 diabetes mellitus with diabetic chronic kidney disease: Secondary | ICD-10-CM | POA: Diagnosis not present

## 2019-12-06 DIAGNOSIS — N184 Chronic kidney disease, stage 4 (severe): Secondary | ICD-10-CM | POA: Diagnosis not present

## 2019-12-06 DIAGNOSIS — R10816 Epigastric abdominal tenderness: Secondary | ICD-10-CM

## 2019-12-06 DIAGNOSIS — I1310 Hypertensive heart and chronic kidney disease without heart failure, with stage 1 through stage 4 chronic kidney disease, or unspecified chronic kidney disease: Secondary | ICD-10-CM

## 2019-12-06 HISTORY — DX: Gastro-esophageal reflux disease without esophagitis: K21.9

## 2019-12-06 HISTORY — DX: Type 2 diabetes mellitus with diabetic chronic kidney disease: E11.22

## 2019-12-06 LAB — POCT UA - MICROALBUMIN: Microalbumin Ur, POC: 150 mg/L

## 2019-12-06 MED ORDER — PANTOPRAZOLE SODIUM 40 MG PO TBEC: 40.0000 mg | DELAYED_RELEASE_TABLET | Freq: Every day | ORAL | 0 refills | Status: DC

## 2019-12-06 NOTE — Assessment & Plan Note (Signed)
New complain for patient. PPI started 1 tablet by mouth daily before breakfast.  Apropriate  Labs collected to rule out H. Pylori

## 2019-12-06 NOTE — Progress Notes (Signed)
Established Patient Office Visit  Subjective:  Patient ID: Victor Schultz, male    DOB: 1951/06/24  Age: 69 y.o. MRN: [id number]  CC: Patient is a 69 year old male. He is here for 6 months f/u diabetes, Mixed hyperlipidemia, hypothyroidism hypertensive heart and chronic kidney disease stage 5. The patient's medications were reviewed and reconciled since the patient's last visit.  History details were provided by the patient. The history appears to be reliable.   Chief Complaint  Patient presents with  . Follow-up    6 months fasting    HPI Victor Schultz presents with type 2 diabetes mellitus with diabetic chronic kidney disease. This is type 2, non-insulin requiring diabetes, complicated by nephropathy.  Compliance with treatment has been good; patient takes his medication as directed, maintains his diet and exercise regimen, follows up as directed, and is keeping a glucose diary.  Patient's diabetes was first diagnosed one year ago.  He specifically denies associated symptoms, including blurred vision, fatigue, headache, hypoglycemia, peripheral neuropathy, excessive thirst and frequent urination.  Current medication include insulin/injectable Lantus 40 units at bed time.  He reports home blood glucose with average fasting readings in the 130-140mg /dL range.  Most recent lab results include glycohemoglobin 7.2%.  His most recent systolic blood pressure was 122/62 mmHg. In regard to preventative care, his last ophthalmology exam was in 2019.  Concurrent health problems include hypercholesterolemia and hypothyroidism.     Concerning end stage renal disease, status: Stable CRF.   Victor Schultz was first diagnosed with renal insufficiency > 20 years ago.  Patient has generally felt well without fatigue or other symptoms.  The patient underwent renal transplant.  Patient is taking the immunosuppressants and rednisone.  Compliance has been good according to the patient.  GFR has been generally stable.  Patient now  being followed by [name] [facility] in [facility].   Diet: Victor Schultz notes close adherence to sodium, fluid and potassium restrictions as required by their diet.  Associated clinical conditions include insulin dependent diabetes, hypertension, and liver disease.      Pt presents with hyperlipidemia. Known CAD s/p CABG. Date of first diagnosis 09/2012.  Current treatment includes Lipitor.  Compliance with treatment has been good; he takes his medication as directed, maintains his low cholesterol diet, and follows up as directed.  He denies experiencing any hypercholesterolemia related symptoms.  Most recent lab tests include total cholesterol level (169  triglycerides (275 HDL) , and LDL cholesterol (74 mg/dl ).  Concurrent health problems include diabetes.    GERD: Paitent complains of heartburn. This has been associated with food intake.  He denies vomiting and nausea Symptoms have been present for about 4 months and getting progressively worse,   He currently weights 166 lb.   Medical therapy in the past has included over the counter Tums. patient reports sleeping in His recliner and not eating pass 7 pm.     Past Medical History:  Diagnosis Date  . Atherosclerotic heart disease of native coronary artery with unstable angina pectoris ([name])   . Atrophy of thyroid (acquired)   . History of end stage renal disease   . Hypertensive heart and chronic kidney disease without heart failure, with stage 5 chronic kidney disease, or end stage renal disease ([name])   . Kidney transplant status   . Long term (current) use of insulin ([name])   . Mixed hyperlipidemia   . Type 2 diabetes mellitus with diabetic chronic kidney disease [name])     Past Surgical  History:  Procedure Laterality Date  . CATARACT EXTRACTION    . CORONARY ARTERY BYPASS GRAFT    . ESOPHAGOGASTRODUODENOSCOPY (EGD) WITH PROPOFOL Left 12/23/2019   Procedure: ESOPHAGOGASTRODUODENOSCOPY (EGD) WITH PROPOFOL;  Surgeon: [name], MD;   Location: [place];  Service: Endoscopy;  Laterality: Left;  . KIDNEY TRANSPLANT     1974, 1982    Family History  Problem Relation Age of Onset  . Liver disease Mother   . Alzheimer's disease Father     Social History   Socioeconomic History  . Marital status: Divorced    Spouse name: Not on file  . Number of children: 1  . Years of education: Not on file  . Highest education level: Not on file  Occupational History  . Occupation: retired  Tobacco Use  . Smoking status: Current Every Day Smoker    Packs/day: 0.75    Years: 39.00    Pack years: 29.25    Types: Cigarettes  . Smokeless tobacco: Never Used  Substance and Sexual Activity  . Alcohol use: Never  . Drug use: Never  . Sexual activity: Not on file  Other Topics Concern  . Not on file  Social History Narrative  . Not on file   Social Determinants of Health   Financial Resource Strain:   . Difficulty of Paying Living Expenses:   Food Insecurity:   . Worried About Charity fundraiser in the Last Year:   . Arboriculturist in the Last Year:   Transportation Needs:   . Film/video editor (Medical):   [name] Lack of Transportation (Non-Medical):   Physical Activity:   . Days of Exercise per Week:   . Minutes of Exercise per Session:   Stress:   . Feeling of Stress :   Social Connections:   . Frequency of Communication with Friends and Family:   . Frequency of Social Gatherings with Friends and Family:   . Attends Religious Services:   . Active Member of Clubs or Organizations:   . Attends Archivist Meetings:   [name] Marital Status:   Intimate Partner Violence:   . Fear of Current or Ex-Partner:   . Emotionally Abused:   [name] Physically Abused:   . Sexually Abused:     Outpatient Medications Prior to Visit  Medication Sig Dispense Refill  . azaTHIOprine (IMURAN) 50 MG tablet Take 125 mg by mouth daily. Taking 2.5 tablets    . B-D UF III MINI PEN NEEDLES 31G X 5 MM MISC USE WITH LANTUS AS  DIRECTED    . LANTUS SOLOSTAR 100 UNIT/ML Solostar Pen Inject 40 Units into the skin at bedtime.     [name] levothyroxine (SYNTHROID) 50 MCG tablet Take 50 mcg by mouth daily.    . metoprolol tartrate (LOPRESSOR) 25 MG tablet Take 12.5 mg by mouth 2 (two) times daily.    . predniSONE (DELTASONE) 5 MG tablet Take 5 mg by mouth every other day.    . sodium bicarbonate 650 MG tablet Take 650 mg by mouth 2 (two) times daily.    . Vitamin D, Ergocalciferol, (DRISDOL) 1.25 MG (50000 UNIT) CAPS capsule Take 50,000 Units by mouth once a week.    [name] aspirin 81 MG EC tablet Take 81 mg by mouth daily.    [name] atorvastatin (LIPITOR) 10 MG tablet Take 10 mg by mouth daily.    . fluorouracil (EFUDEX) 5 % cream      No facility-administered medications prior to visit.  No Known Allergies  ROS Review of Systems  Constitutional: Negative for activity change, appetite change, fatigue and fever.  HENT: Negative for congestion, ear pain, sinus pressure and sinus pain.   Eyes: Negative for pain and redness.  Respiratory: Negative for cough, chest tightness and shortness of breath.   Cardiovascular: Negative for chest pain and palpitations.  Gastrointestinal: Positive for abdominal pain. Negative for abdominal distention and constipation.  Genitourinary: Negative for difficulty urinating.  Musculoskeletal: Negative for arthralgias, gait problem and myalgias.  Skin: Negative for rash.  Neurological: Negative for dizziness and weakness.  Psychiatric/Behavioral: Negative for agitation. The patient is not nervous/anxious.        Objective:    Physical Exam  Constitutional: He appears well-developed and well-nourished.  HENT:  Head: Normocephalic.  Right Ear: External ear normal.  Left Ear: External ear normal.  Nose: Nose normal.  Mouth/Throat: Oropharynx is clear and moist.  Eyes: Pupils are equal, round, and reactive to light. EOM are normal. Right eye exhibits no discharge. Left eye exhibits no  discharge.  Cardiovascular: Normal rate, regular rhythm and normal heart sounds.  Pulmonary/Chest: Effort normal and breath sounds normal. No respiratory distress. He exhibits no tenderness.  Abdominal: Soft. Bowel sounds are normal. There is no rebound.  epigastric pain  Musculoskeletal:     Cervical back: Normal range of motion and neck supple.  Skin: Skin is warm. No rash noted.  Psychiatric: He has a normal mood and affect.       BP 122/62 (BP Location: Left Arm, Patient Position: Sitting)   Pulse 100   Temp (!) 97.5 F (36.4 C) (Temporal)   Resp 18   Ht 5\' 4"  (1.626 m)   Wt 166 lb (75.3 kg)   BMI 28.49 kg/m  Wt Readings from Last 3 Encounters:  12/26/19 163 lb 5.8 oz (74.1 kg)  12/06/19 166 lb (75.3 kg)     Health Maintenance Due  Topic Date Due  . Hepatitis C Screening  Never done  . FOOT EXAM  Never done  . OPHTHALMOLOGY EXAM  Never done  . TETANUS/TDAP  Never done  . COLONOSCOPY  Never done  . PNA vac Low Risk Adult (1 of 2 - PCV13) Never done  . INFLUENZA VACCINE  05/08/2019    There are no preventive care reminders to display for this patient.  Lab Results  Component Value Date   TSH 3.310 12/06/2019   Lab Results  Component Value Date   WBC 5.3 12/27/2019   HGB 8.7 (L) 12/27/2019   HCT 26.8 (L) 12/27/2019   MCV 100.8 (H) 12/27/2019   PLT 183 12/27/2019   Lab Results  Component Value Date   NA 141 12/27/2019   K 4.7 12/27/2019   CO2 20 (L) 12/27/2019   GLUCOSE 90 12/27/2019   BUN 63 (H) 12/27/2019   CREATININE 3.66 (H) 12/27/2019   BILITOT 0.7 12/27/2019   ALKPHOS 53 12/27/2019   AST 35 12/27/2019   ALT 31 12/27/2019   PROT 5.0 (L) 12/27/2019   ALBUMIN 1.7 (L) 12/27/2019   CALCIUM 7.9 (L) 12/27/2019   ANIONGAP 9 12/27/2019   Lab Results  Component Value Date   CHOL 196 12/06/2019   Lab Results  Component Value Date   HDL 46 12/06/2019   Lab Results  Component Value Date   LDLCALC 106 (H) 12/06/2019   Lab Results  Component  Value Date   TRIG 258 (H) 12/06/2019   Lab Results  Component Value Date   CHOLHDL  4.3 12/06/2019   Lab Results  Component Value Date   HGBA1C 6.5 (H) 12/06/2019      Assessment & Plan:   Problem List Items Addressed This Visit      Cardiovascular and Mediastinum   Hypertensive heart and renal disease     Digestive   Gastroesophageal reflux disease without esophagitis    New complain for patient. PPI started 1 tablet by mouth daily before breakfast.  Apropriate  Labs collected to rule out H. Pylori       Relevant Orders   H Pylori, IGM, IGG, IGA AB (Completed)     Endocrine   Type 2 diabetes mellitus with chronic kidney disease, without long-term current use of insulin ([place]) - Primary    Patient well managed on current medication, labs collected. Will continue to follow up      Relevant Orders   POCT UA - Microalbumin (Completed)   Comprehensive metabolic panel (Completed)   CBC with Differential/Platelet (Completed)   Hemoglobin A1c (Completed)   Urinalysis - Glucose/Protein   TSH (Completed)     Genitourinary   Chronic kidney disease, stage 4 (severe) ([facility]) - mgmt by nephrology.      Other   Mixed hyperlipidemia    Pt well managed on current medication, apropriate labs collected      Relevant Orders   Lipid Panel (Completed)   Cardiovascular Risk Assessment (Completed)   Epigastric abdominal tenderness without rebound tenderness   Relevant Orders   H Pylori, IGM, IGG, IGA AB (Completed)      Meds ordered this encounter  Medications  . DISCONTD: pantoprazole (PROTONIX) 40 MG tablet    Sig: Take 1 tablet (40 mg total) by mouth daily.    Dispense:  30 tablet    Refill:  0    Order Specific Question:   Supervising Provider    AnswerShelton Silvas    Follow-up: Return in about 6 months (around 06/07/2020).    Rochel Brome, MD

## 2019-12-06 NOTE — Assessment & Plan Note (Signed)
Pt well managed on current medication, apropriate labs collected

## 2019-12-06 NOTE — Assessment & Plan Note (Signed)
Patient well managed on current medication, labs collected. Will continue to follow up

## 2019-12-06 NOTE — Patient Instructions (Addendum)
Patient well managed on current diabetic medication and cholesterol lowering agents. Pt is followed by a Nephrologist for end stage renal disease. CMP, CBC, A1c, lipids and UA (albumin). Follow up in 6 months. Follow up 1 week after starting Protonix    Gastroesophageal Reflux Disease, Adult Gastroesophageal reflux (GER) happens when acid from the stomach flows up into the tube that connects the mouth and the stomach (esophagus). Normally, food travels down the esophagus and stays in the stomach to be digested. With GER, food and stomach acid sometimes move back up into the esophagus. You may have a disease called gastroesophageal reflux disease (GERD) if the reflux:  Happens often.  Causes frequent or very bad symptoms.  Causes problems such as damage to the esophagus. When this happens, the esophagus becomes sore and swollen (inflamed). Over time, GERD can make small holes (ulcers) in the lining of the esophagus. What are the causes? This condition is caused by a problem with the muscle between the esophagus and the stomach. When this muscle is weak or not normal, it does not close properly to keep food and acid from coming back up from the stomach. The muscle can be weak because of:  Tobacco use.  Pregnancy.  Having a certain type of hernia (hiatal hernia).  Alcohol use.  Certain foods and drinks, such as coffee, chocolate, onions, and peppermint. What increases the risk? You are more likely to develop this condition if you:  Are overweight.  Have a disease that affects your connective tissue.  Use NSAID medicines. What are the signs or symptoms? Symptoms of this condition include:  Heartburn.  Difficult or painful swallowing.  The feeling of having a lump in the throat.  A bitter taste in the mouth.  Bad breath.  Having a lot of saliva.  Having an upset or bloated stomach.  Belching.  Chest pain. Different conditions can cause chest pain. Make sure you see your  doctor if you have chest pain.  Shortness of breath or noisy breathing (wheezing).  Ongoing (chronic) cough or a cough at night.  Wearing away of the surface of teeth (tooth enamel).  Weight loss. How is this treated? Treatment will depend on how bad your symptoms are. Your doctor may suggest:  Changes to your diet.  Medicine.  Surgery. Follow these instructions at home: Eating and drinking   Follow a diet as told by your doctor. You may need to avoid foods and drinks such as: ? Coffee and tea (with or without caffeine). ? Drinks that contain alcohol. ? Energy drinks and sports drinks. ? Bubbly (carbonated) drinks or sodas. ? Chocolate and cocoa. ? Peppermint and mint flavorings. ? Garlic and onions. ? Horseradish. ? Spicy and acidic foods. These include peppers, chili powder, curry powder, vinegar, hot sauces, and BBQ sauce. ? Citrus fruit juices and citrus fruits, such as oranges, lemons, and limes. ? Tomato-based foods. These include red sauce, chili, salsa, and pizza with red sauce. ? Fried and fatty foods. These include donuts, french fries, potato chips, and high-fat dressings. ? High-fat meats. These include hot dogs, rib eye steak, sausage, ham, and bacon. ? High-fat dairy items, such as whole milk, butter, and cream cheese.  Eat small meals often. Avoid eating large meals.  Avoid drinking large amounts of liquid with your meals.  Avoid eating meals during the 2-3 hours before bedtime.  Avoid lying down right after you eat.  Do not exercise right after you eat. Lifestyle   Do not use any  products that contain nicotine or tobacco. These include cigarettes, e-cigarettes, and chewing tobacco. If you need help quitting, ask your doctor.  Try to lower your stress. If you need help doing this, ask your doctor.  If you are overweight, lose an amount of weight that is healthy for you. Ask your doctor about a safe weight loss goal. General instructions  Pay  attention to any changes in your symptoms.  Take over-the-counter and prescription medicines only as told by your doctor. Do not take aspirin, ibuprofen, or other NSAIDs unless your doctor says it is okay.  Wear loose clothes. Do not wear anything tight around your waist.  Raise (elevate) the head of your bed about 6 inches (15 cm).  Avoid bending over if this makes your symptoms worse.  Keep all follow-up visits as told by your doctor. This is important. Contact a doctor if:  You have new symptoms.  You lose weight and you do not know why.  You have trouble swallowing or it hurts to swallow.  You have wheezing or a cough that keeps happening.  Your symptoms do not get better with treatment.  You have a hoarse voice. Get help right away if:  You have pain in your arms, neck, jaw, teeth, or back.  You feel sweaty, dizzy, or light-headed.  You have chest pain or shortness of breath.  You throw up (vomit) and your throw-up looks like blood or coffee grounds.  You pass out (faint).  Your poop (stool) is bloody or black.  You cannot swallow, drink, or eat. Summary  If a person has gastroesophageal reflux disease (GERD), food and stomach acid move back up into the esophagus and cause symptoms or problems such as damage to the esophagus.  Treatment will depend on how bad your symptoms are.  Follow a diet as told by your doctor.  Take all medicines only as told by your doctor. This information is not intended to replace advice given to you by your health care provider. Make sure you discuss any questions you have with your health care provider. Document Revised: 04/01/2018 Document Reviewed: 04/01/2018 Elsevier Patient Education  [place].    High Cholesterol  High cholesterol is a condition in which the blood has high levels of a white, waxy, fat-like substance (cholesterol). The human body needs small amounts of cholesterol. The liver makes all the  cholesterol that the body needs. Extra (excess) cholesterol comes from the food that we eat. Cholesterol is carried from the liver by the blood through the blood vessels. If you have high cholesterol, deposits (plaques) may build up on the walls of your blood vessels (arteries). Plaques make the arteries narrower and stiffer. Cholesterol plaques increase your risk for heart attack and stroke. Work with your health care provider to keep your cholesterol levels in a healthy range. What increases the risk? This condition is more likely to develop in people who:  Eat foods that are high in animal fat (saturated fat) or cholesterol.  Are overweight.  Are not getting enough exercise.  Have a family history of high cholesterol. What are the signs or symptoms? There are no symptoms of this condition. How is this diagnosed? This condition may be diagnosed from the results of a blood test.  If you are older than age 14, your health care provider may check your cholesterol every 4-6 years.  You may be checked more often if you already have high cholesterol or other risk factors for heart disease. The  blood test for cholesterol measures:  "Bad" cholesterol (LDL cholesterol). This is the main type of cholesterol that causes heart disease. The desired level for LDL is less than 100.  "Good" cholesterol (HDL cholesterol). This type helps to protect against heart disease by cleaning the arteries and carrying the LDL away. The desired level for HDL is 60 or higher.  Triglycerides. These are fats that the body can store or burn for energy. The desired number for triglycerides is lower than 150.  Total cholesterol. This is a measure of the total amount of cholesterol in your blood, including LDL cholesterol, HDL cholesterol, and triglycerides. A healthy number is less than 200. How is this treated? This condition is treated with diet changes, lifestyle changes, and medicines. Diet changes  This may  include eating more whole grains, fruits, vegetables, nuts, and fish.  This may also include cutting back on red meat and foods that have a lot of added sugar. Lifestyle changes  Changes may include getting at least 40 minutes of aerobic exercise 3 times a week. Aerobic exercises include walking, biking, and swimming. Aerobic exercise along with a healthy diet can help you maintain a healthy weight.  Changes may also include quitting smoking. Medicines  Medicines are usually given if diet and lifestyle changes have failed to reduce your cholesterol to healthy levels.  Your health care provider may prescribe a statin medicine. Statin medicines have been shown to reduce cholesterol, which can reduce the risk of heart disease. Follow these instructions at home: Eating and drinking If told by your health care provider:  Eat chicken (without skin), fish, veal, shellfish, ground [place] breast, and round or loin cuts of red meat.  Do not eat fried foods or fatty meats, such as hot dogs and salami.  Eat plenty of fruits, such as apples.  Eat plenty of vegetables, such as broccoli, potatoes, and carrots.  Eat beans, peas, and lentils.  Eat grains such as barley, rice, couscous, and bulgur wheat.  Eat pasta without cream sauces.  Use skim or nonfat milk, and eat low-fat or nonfat yogurt and cheeses.  Do not eat or drink whole milk, cream, ice cream, egg yolks, or hard cheeses.  Do not eat stick margarine or tub margarines that contain trans fats (also called partially hydrogenated oils).  Do not eat saturated tropical oils, such as coconut oil and palm oil.  Do not eat cakes, cookies, crackers, or other baked goods that contain trans fats.  General instructions  Exercise as directed by your health care provider. Increase your activity level with activities such as gardening, walking, and taking the stairs.  Take over-the-counter and prescription medicines only as told by your  health care provider.  Do not use any products that contain nicotine or tobacco, such as cigarettes and e-cigarettes. If you need help quitting, ask your health care provider.  Keep all follow-up visits as told by your health care provider. This is important. Contact a health care provider if:  You are struggling to maintain a healthy diet or weight.  You need help to start on an exercise program.  You need help to stop smoking. Get help right away if:  You have chest pain.  You have trouble breathing. This information is not intended to replace advice given to you by your health care provider. Make sure you discuss any questions you have with your health care provider. Document Revised: 09/26/2017 Document Reviewed: 03/23/2016 Elsevier Patient Education  [place].

## 2019-12-07 LAB — COMPREHENSIVE METABOLIC PANEL
ALT: 33 IU/L (ref 0–44)
AST: 40 IU/L (ref 0–40)
Albumin/Globulin Ratio: 0.7 — ABNORMAL LOW (ref 1.2–2.2)
Albumin: 2.9 g/dL — ABNORMAL LOW (ref 3.8–4.8)
Alkaline Phosphatase: 91 IU/L (ref 39–117)
BUN/Creatinine Ratio: 14 (ref 10–24)
BUN: 41 mg/dL — ABNORMAL HIGH (ref 8–27)
Bilirubin Total: 0.5 mg/dL (ref 0.0–1.2)
CO2: 15 mmol/L — ABNORMAL LOW (ref 20–29)
Calcium: 8.8 mg/dL (ref 8.6–10.2)
Chloride: 108 mmol/L — ABNORMAL HIGH (ref 96–106)
Creatinine, Ser: 2.97 mg/dL — ABNORMAL HIGH (ref 0.76–1.27)
GFR calc Af Amer: 24 mL/min/{1.73_m2} — ABNORMAL LOW (ref >59)
GFR calc non Af Amer: 21 mL/min/{1.73_m2} — ABNORMAL LOW (ref >59)
Globulin, Total: 3.9 g/dL (ref 1.5–4.5)
Glucose: 121 mg/dL — ABNORMAL HIGH (ref 65–99)
Potassium: 5.5 mmol/L — ABNORMAL HIGH (ref 3.5–5.2)
Sodium: 141 mmol/L (ref 134–144)
Total Protein: 6.8 g/dL (ref 6.0–8.5)

## 2019-12-07 LAB — LIPID PANEL
Chol/HDL Ratio: 4.3 ratio (ref 0.0–5.0)
Cholesterol, Total: 196 mg/dL (ref 100–199)
HDL: 46 mg/dL (ref >39)
LDL Chol Calc (NIH): 106 mg/dL — ABNORMAL HIGH (ref 0–99)
Triglycerides: 258 mg/dL — ABNORMAL HIGH (ref 0–149)
VLDL Cholesterol Cal: 44 mg/dL — ABNORMAL HIGH (ref 5–40)

## 2019-12-07 LAB — CBC WITH DIFFERENTIAL/PLATELET
Basophils Absolute: 0 10*3/uL (ref 0.0–0.2)
Basos: 0 %
EOS (ABSOLUTE): 0.1 10*3/uL (ref 0.0–0.4)
Eos: 1 %
Hematocrit: 33.1 % — ABNORMAL LOW (ref 37.5–51.0)
Hemoglobin: 11.1 g/dL — ABNORMAL LOW (ref 13.0–17.7)
Immature Grans (Abs): 0.1 10*3/uL (ref 0.0–0.1)
Immature Granulocytes: 2 %
Lymphocytes Absolute: 2.7 10*3/uL (ref 0.7–3.1)
Lymphs: 30 %
MCH: 34.8 pg — ABNORMAL HIGH (ref 26.6–33.0)
MCHC: 33.5 g/dL (ref 31.5–35.7)
MCV: 104 fL — ABNORMAL HIGH (ref 79–97)
Monocytes Absolute: 0.8 10*3/uL (ref 0.1–0.9)
Monocytes: 9 %
Neutrophils Absolute: 5.2 10*3/uL (ref 1.4–7.0)
Neutrophils: 58 %
Platelets: 272 10*3/uL (ref 150–450)
RBC: 3.19 x10E6/uL — ABNORMAL LOW (ref 4.14–5.80)
RDW: 13.7 % (ref 11.6–15.4)
WBC: 8.9 10*3/uL (ref 3.4–10.8)

## 2019-12-07 LAB — HEMOGLOBIN A1C
Est. average glucose Bld gHb Est-mCnc: 140 mg/dL
Hgb A1c MFr Bld: 6.5 % — ABNORMAL HIGH (ref 4.8–5.6)

## 2019-12-07 LAB — TSH: TSH: 3.31 u[IU]/mL (ref 0.450–4.500)

## 2019-12-07 LAB — H PYLORI, IGM, IGG, IGA AB
H pylori, IgM Abs: <9 units (ref 0.0–8.9)
H. pylori, IgA Abs: <9 units (ref 0.0–8.9)
H. pylori, IgG AbS: 0.34 Index Value (ref 0.00–0.79)

## 2019-12-07 LAB — CARDIOVASCULAR RISK ASSESSMENT

## 2019-12-08 ENCOUNTER — Other Ambulatory Visit: Payer: Self-pay | Admitting: Nurse Practitioner

## 2019-12-08 ENCOUNTER — Ambulatory Visit: Payer: Medicare Other

## 2019-12-08 ENCOUNTER — Other Ambulatory Visit: Payer: Self-pay

## 2019-12-08 ENCOUNTER — Encounter: Payer: Self-pay | Admitting: Nurse Practitioner

## 2019-12-08 DIAGNOSIS — E1169 Type 2 diabetes mellitus with other specified complication: Secondary | ICD-10-CM

## 2019-12-08 DIAGNOSIS — D582 Other hemoglobinopathies: Secondary | ICD-10-CM

## 2019-12-08 DIAGNOSIS — E875 Hyperkalemia: Secondary | ICD-10-CM

## 2019-12-08 DIAGNOSIS — D539 Nutritional anemia, unspecified: Secondary | ICD-10-CM | POA: Diagnosis not present

## 2019-12-08 DIAGNOSIS — Z23 Encounter for immunization: Secondary | ICD-10-CM | POA: Diagnosis not present

## 2019-12-08 HISTORY — DX: Hyperkalemia: E87.5

## 2019-12-08 HISTORY — DX: Other hemoglobinopathies: D58.2

## 2019-12-08 MED ORDER — ATORVASTATIN CALCIUM 20 MG PO TABS: 20.0000 mg | ORAL_TABLET | Freq: Every day | ORAL | 3 refills | Status: DC

## 2019-12-08 MED ORDER — ICOSAPENT ETHYL 1 G PO CAPS: 2.0000 g | ORAL_CAPSULE | Freq: Two times a day (BID) | ORAL | 0 refills | Status: DC

## 2019-12-09 LAB — COMPREHENSIVE METABOLIC PANEL
ALT: 26 IU/L (ref 0–44)
AST: 29 IU/L (ref 0–40)
Albumin/Globulin Ratio: 0.8 — ABNORMAL LOW (ref 1.2–2.2)
Albumin: 3 g/dL — ABNORMAL LOW (ref 3.8–4.8)
Alkaline Phosphatase: 94 IU/L (ref 39–117)
BUN/Creatinine Ratio: 14 (ref 10–24)
BUN: 36 mg/dL — ABNORMAL HIGH (ref 8–27)
Bilirubin Total: 0.5 mg/dL (ref 0.0–1.2)
CO2: 16 mmol/L — ABNORMAL LOW (ref 20–29)
Calcium: 8.3 mg/dL — ABNORMAL LOW (ref 8.6–10.2)
Chloride: 107 mmol/L — ABNORMAL HIGH (ref 96–106)
Creatinine, Ser: 2.54 mg/dL — ABNORMAL HIGH (ref 0.76–1.27)
GFR calc Af Amer: 29 mL/min/{1.73_m2} — ABNORMAL LOW (ref >59)
GFR calc non Af Amer: 25 mL/min/{1.73_m2} — ABNORMAL LOW (ref >59)
Globulin, Total: 3.9 g/dL (ref 1.5–4.5)
Glucose: 268 mg/dL — ABNORMAL HIGH (ref 65–99)
Potassium: 5.4 mmol/L — ABNORMAL HIGH (ref 3.5–5.2)
Sodium: 138 mmol/L (ref 134–144)
Total Protein: 6.9 g/dL (ref 6.0–8.5)

## 2019-12-09 LAB — B12 AND FOLATE PANEL
Folate: 11 ng/mL (ref >3.0)
Vitamin B-12: 434 pg/mL (ref 232–1245)

## 2019-12-22 ENCOUNTER — Encounter (HOSPITAL_COMMUNITY): Payer: Self-pay | Admitting: Emergency Medicine

## 2019-12-22 ENCOUNTER — Inpatient Hospital Stay (HOSPITAL_COMMUNITY)
Admission: EM | Admit: 2019-12-22 | Discharge: 2019-12-27 | DRG: 381 | Disposition: A | Discharge status: Home / Self Care | Payer: Medicare Other | Attending: Internal Medicine | Admitting: Internal Medicine

## 2019-12-22 ENCOUNTER — Emergency Department (HOSPITAL_COMMUNITY): Payer: Medicare Other

## 2019-12-22 ENCOUNTER — Other Ambulatory Visit: Payer: Self-pay

## 2019-12-22 DIAGNOSIS — T8619 Other complication of kidney transplant: Secondary | ICD-10-CM | POA: Diagnosis present

## 2019-12-22 DIAGNOSIS — Z794 Long term (current) use of insulin: Secondary | ICD-10-CM | POA: Diagnosis not present

## 2019-12-22 DIAGNOSIS — K922 Gastrointestinal hemorrhage, unspecified: Secondary | ICD-10-CM

## 2019-12-22 DIAGNOSIS — K2210 Ulcer of esophagus without bleeding: Secondary | ICD-10-CM | POA: Diagnosis not present

## 2019-12-22 DIAGNOSIS — Z66 Do not resuscitate: Secondary | ICD-10-CM | POA: Diagnosis present

## 2019-12-22 DIAGNOSIS — R0602 Shortness of breath: Secondary | ICD-10-CM | POA: Diagnosis not present

## 2019-12-22 DIAGNOSIS — E034 Atrophy of thyroid (acquired): Secondary | ICD-10-CM | POA: Diagnosis present

## 2019-12-22 DIAGNOSIS — Z940 Kidney transplant status: Secondary | ICD-10-CM | POA: Diagnosis not present

## 2019-12-22 DIAGNOSIS — U071 COVID-19: Secondary | ICD-10-CM | POA: Diagnosis not present

## 2019-12-22 DIAGNOSIS — N179 Acute kidney failure, unspecified: Secondary | ICD-10-CM | POA: Diagnosis present

## 2019-12-22 DIAGNOSIS — Y830 Surgical operation with transplant of whole organ as the cause of abnormal reaction of the patient, or of later complication, without mention of misadventure at the time of the procedure: Secondary | ICD-10-CM | POA: Diagnosis present

## 2019-12-22 DIAGNOSIS — Q604 Renal hypoplasia, bilateral: Secondary | ICD-10-CM | POA: Diagnosis not present

## 2019-12-22 DIAGNOSIS — R1111 Vomiting without nausea: Secondary | ICD-10-CM | POA: Diagnosis not present

## 2019-12-22 DIAGNOSIS — I10 Essential (primary) hypertension: Secondary | ICD-10-CM | POA: Diagnosis present

## 2019-12-22 DIAGNOSIS — R0689 Other abnormalities of breathing: Secondary | ICD-10-CM | POA: Diagnosis not present

## 2019-12-22 DIAGNOSIS — K921 Melena: Secondary | ICD-10-CM | POA: Diagnosis not present

## 2019-12-22 DIAGNOSIS — E875 Hyperkalemia: Secondary | ICD-10-CM | POA: Diagnosis present

## 2019-12-22 DIAGNOSIS — E1129 Type 2 diabetes mellitus with other diabetic kidney complication: Secondary | ICD-10-CM | POA: Diagnosis not present

## 2019-12-22 DIAGNOSIS — Z951 Presence of aortocoronary bypass graft: Secondary | ICD-10-CM

## 2019-12-22 DIAGNOSIS — E782 Mixed hyperlipidemia: Secondary | ICD-10-CM | POA: Diagnosis present

## 2019-12-22 DIAGNOSIS — Z7982 Long term (current) use of aspirin: Secondary | ICD-10-CM | POA: Diagnosis not present

## 2019-12-22 DIAGNOSIS — F17210 Nicotine dependence, cigarettes, uncomplicated: Secondary | ICD-10-CM | POA: Diagnosis present

## 2019-12-22 DIAGNOSIS — I129 Hypertensive chronic kidney disease with stage 1 through stage 4 chronic kidney disease, or unspecified chronic kidney disease: Secondary | ICD-10-CM | POA: Diagnosis not present

## 2019-12-22 DIAGNOSIS — R42 Dizziness and giddiness: Secondary | ICD-10-CM | POA: Diagnosis not present

## 2019-12-22 DIAGNOSIS — D62 Acute posthemorrhagic anemia: Secondary | ICD-10-CM | POA: Diagnosis present

## 2019-12-22 DIAGNOSIS — D649 Anemia, unspecified: Secondary | ICD-10-CM | POA: Diagnosis not present

## 2019-12-22 DIAGNOSIS — Z820 Family history of epilepsy and other diseases of the nervous system: Secondary | ICD-10-CM | POA: Diagnosis not present

## 2019-12-22 DIAGNOSIS — Z20822 Contact with and (suspected) exposure to covid-19: Secondary | ICD-10-CM | POA: Diagnosis present

## 2019-12-22 DIAGNOSIS — K2211 Ulcer of esophagus with bleeding: Principal | ICD-10-CM | POA: Diagnosis present

## 2019-12-22 DIAGNOSIS — Z7952 Long term (current) use of systemic steroids: Secondary | ICD-10-CM | POA: Diagnosis not present

## 2019-12-22 DIAGNOSIS — R112 Nausea with vomiting, unspecified: Secondary | ICD-10-CM | POA: Diagnosis not present

## 2019-12-22 DIAGNOSIS — K920 Hematemesis: Secondary | ICD-10-CM | POA: Diagnosis not present

## 2019-12-22 DIAGNOSIS — N184 Chronic kidney disease, stage 4 (severe): Secondary | ICD-10-CM | POA: Diagnosis present

## 2019-12-22 DIAGNOSIS — I1311 Hypertensive heart and chronic kidney disease without heart failure, with stage 5 chronic kidney disease, or end stage renal disease: Secondary | ICD-10-CM | POA: Diagnosis present

## 2019-12-22 DIAGNOSIS — Z79890 Hormone replacement therapy (postmenopausal): Secondary | ICD-10-CM | POA: Diagnosis not present

## 2019-12-22 DIAGNOSIS — Z79899 Other long term (current) drug therapy: Secondary | ICD-10-CM | POA: Diagnosis not present

## 2019-12-22 DIAGNOSIS — E1122 Type 2 diabetes mellitus with diabetic chronic kidney disease: Secondary | ICD-10-CM | POA: Diagnosis present

## 2019-12-22 DIAGNOSIS — F17200 Nicotine dependence, unspecified, uncomplicated: Secondary | ICD-10-CM | POA: Diagnosis present

## 2019-12-22 DIAGNOSIS — I959 Hypotension, unspecified: Secondary | ICD-10-CM | POA: Diagnosis not present

## 2019-12-22 DIAGNOSIS — I2510 Atherosclerotic heart disease of native coronary artery without angina pectoris: Secondary | ICD-10-CM | POA: Diagnosis present

## 2019-12-22 DIAGNOSIS — E872 Acidosis: Secondary | ICD-10-CM | POA: Diagnosis present

## 2019-12-22 HISTORY — DX: Gastrointestinal hemorrhage, unspecified: K92.2

## 2019-12-22 HISTORY — DX: Essential (primary) hypertension: I10

## 2019-12-22 LAB — CBC
HCT: 26 % — ABNORMAL LOW (ref 39.0–52.0)
HCT: 30.4 % — ABNORMAL LOW (ref 39.0–52.0)
Hemoglobin: 7.9 g/dL — ABNORMAL LOW (ref 13.0–17.0)
Hemoglobin: 10 g/dL — ABNORMAL LOW (ref 13.0–17.0)
MCH: 34.3 pg — ABNORMAL HIGH (ref 26.0–34.0)
MCH: 32.8 pg (ref 26.0–34.0)
MCHC: 30.4 g/dL (ref 30.0–36.0)
MCHC: 32.9 g/dL (ref 30.0–36.0)
MCV: 113 fL — ABNORMAL HIGH (ref 80.0–100.0)
MCV: 99.7 fL (ref 80.0–100.0)
Platelets: 329 10*3/uL (ref 150–400)
Platelets: 238 10*3/uL (ref 150–400)
RBC: 2.3 MIL/uL — ABNORMAL LOW (ref 4.22–5.81)
RBC: 3.05 MIL/uL — ABNORMAL LOW (ref 4.22–5.81)
RDW: 14.9 % (ref 11.5–15.5)
RDW: 20.5 % — ABNORMAL HIGH (ref 11.5–15.5)
WBC: 11.6 10*3/uL — ABNORMAL HIGH (ref 4.0–10.5)
WBC: 12.6 10*3/uL — ABNORMAL HIGH (ref 4.0–10.5)
nRBC: 0.5 % — ABNORMAL HIGH (ref 0.0–0.2)
nRBC: 0 % (ref 0.0–0.2)

## 2019-12-22 LAB — POC OCCULT BLOOD, ED: Fecal Occult Bld: POSITIVE — AB

## 2019-12-22 LAB — BASIC METABOLIC PANEL
Anion gap: 15 (ref 5–15)
BUN: 82 mg/dL — ABNORMAL HIGH (ref 8–23)
CO2: 17 mmol/L — ABNORMAL LOW (ref 22–32)
Calcium: 7.9 mg/dL — ABNORMAL LOW (ref 8.9–10.3)
Chloride: 110 mmol/L (ref 98–111)
Creatinine, Ser: 3.73 mg/dL — ABNORMAL HIGH (ref 0.61–1.24)
GFR calc Af Amer: 18 mL/min — ABNORMAL LOW (ref >60)
GFR calc non Af Amer: 16 mL/min — ABNORMAL LOW (ref >60)
Glucose, Bld: 213 mg/dL — ABNORMAL HIGH (ref 70–99)
Potassium: 4.9 mmol/L (ref 3.5–5.1)
Sodium: 142 mmol/L (ref 135–145)

## 2019-12-22 LAB — COMPREHENSIVE METABOLIC PANEL
ALT: 27 U/L (ref 0–44)
AST: 32 U/L (ref 15–41)
Albumin: 1.7 g/dL — ABNORMAL LOW (ref 3.5–5.0)
Alkaline Phosphatase: 51 U/L (ref 38–126)
Anion gap: 13 (ref 5–15)
BUN: 76 mg/dL — ABNORMAL HIGH (ref 8–23)
CO2: 18 mmol/L — ABNORMAL LOW (ref 22–32)
Calcium: 7.7 mg/dL — ABNORMAL LOW (ref 8.9–10.3)
Chloride: 109 mmol/L (ref 98–111)
Creatinine, Ser: 3.56 mg/dL — ABNORMAL HIGH (ref 0.61–1.24)
GFR calc Af Amer: 19 mL/min — ABNORMAL LOW (ref >60)
GFR calc non Af Amer: 16 mL/min — ABNORMAL LOW (ref >60)
Glucose, Bld: 131 mg/dL — ABNORMAL HIGH (ref 70–99)
Potassium: 6.1 mmol/L — ABNORMAL HIGH (ref 3.5–5.1)
Sodium: 140 mmol/L (ref 135–145)
Total Bilirubin: 0.7 mg/dL (ref 0.3–1.2)
Total Protein: 5.4 g/dL — ABNORMAL LOW (ref 6.5–8.1)

## 2019-12-22 LAB — HEMOGLOBIN AND HEMATOCRIT, BLOOD
HCT: 30.7 % — ABNORMAL LOW (ref 39.0–52.0)
Hemoglobin: 9.9 g/dL — ABNORMAL LOW (ref 13.0–17.0)

## 2019-12-22 LAB — SARS CORONAVIRUS 2 (TAT 6-24 HRS): SARS Coronavirus 2: NEGATIVE

## 2019-12-22 LAB — CBG MONITORING, ED
Glucose-Capillary: 96 mg/dL (ref 70–99)
Glucose-Capillary: 116 mg/dL — ABNORMAL HIGH (ref 70–99)

## 2019-12-22 LAB — ABO/RH: ABO/RH(D): O POS

## 2019-12-22 LAB — PREPARE RBC (CROSSMATCH)

## 2019-12-22 LAB — POC SARS CORONAVIRUS 2 AG -  ED: SARS Coronavirus 2 Ag: POSITIVE — AB

## 2019-12-22 MED ORDER — SODIUM CHLORIDE 0.9% FLUSH
3.0000 mL | Freq: Two times a day (BID) | INTRAVENOUS | Status: DC
  Administered 2019-12-22 – 2019-12-27 (×10): 3 mL via INTRAVENOUS

## 2019-12-22 MED ORDER — DEXTROSE 50 % IV SOLN
1.0000 | Freq: Once | INTRAVENOUS | Status: AC
  Administered 2019-12-22: 50 mL via INTRAVENOUS
  Filled 2019-12-22: qty 50

## 2019-12-22 MED ORDER — ATORVASTATIN CALCIUM 10 MG PO TABS
10.0000 mg | ORAL_TABLET | Freq: Every day | ORAL | Status: DC
  Administered 2019-12-23 – 2019-12-27 (×5): 10 mg via ORAL
  Filled 2019-12-22 (×5): qty 1

## 2019-12-22 MED ORDER — SODIUM ZIRCONIUM CYCLOSILICATE 10 G PO PACK
10.0000 g | PACK | Freq: Once | ORAL | Status: AC
  Administered 2019-12-22: 10 g via ORAL
  Filled 2019-12-22: qty 1

## 2019-12-22 MED ORDER — ONDANSETRON HCL 4 MG/2ML IJ SOLN
4.0000 mg | Freq: Once | INTRAMUSCULAR | Status: AC
  Administered 2019-12-22: 4 mg via INTRAVENOUS
  Filled 2019-12-22: qty 2

## 2019-12-22 MED ORDER — SODIUM CHLORIDE 0.9 % IV SOLN
1.0000 g | Freq: Once | INTRAVENOUS | Status: AC
  Administered 2019-12-22: 1 g via INTRAVENOUS
  Filled 2019-12-22: qty 10

## 2019-12-22 MED ORDER — ACETAMINOPHEN 325 MG PO TABS: 650.0000 mg | ORAL_TABLET | Freq: Four times a day (QID) | ORAL | Status: DC | PRN

## 2019-12-22 MED ORDER — METOPROLOL TARTRATE 12.5 MG HALF TABLET
12.5000 mg | ORAL_TABLET | Freq: Two times a day (BID) | ORAL | Status: DC
  Administered 2019-12-22 – 2019-12-25 (×6): 12.5 mg via ORAL
  Filled 2019-12-22 (×6): qty 1

## 2019-12-22 MED ORDER — ONDANSETRON HCL 4 MG PO TABS: 4.0000 mg | ORAL_TABLET | Freq: Four times a day (QID) | ORAL | Status: DC | PRN

## 2019-12-22 MED ORDER — SODIUM BICARBONATE 650 MG PO TABS
650.0000 mg | ORAL_TABLET | Freq: Two times a day (BID) | ORAL | Status: DC
  Administered 2019-12-22 – 2019-12-27 (×10): 650 mg via ORAL
  Filled 2019-12-22 (×10): qty 1

## 2019-12-22 MED ORDER — INSULIN ASPART 100 UNIT/ML IV SOLN
5.0000 [IU] | Freq: Once | INTRAVENOUS | Status: AC
  Administered 2019-12-22: 5 [IU] via INTRAVENOUS

## 2019-12-22 MED ORDER — LACTATED RINGERS IV SOLN: INTRAVENOUS | Status: DC

## 2019-12-22 MED ORDER — AZATHIOPRINE 50 MG PO TABS
125.0000 mg | ORAL_TABLET | Freq: Every day | ORAL | Status: DC
  Administered 2019-12-23 – 2019-12-27 (×5): 125 mg via ORAL
  Filled 2019-12-22 (×6): qty 3

## 2019-12-22 MED ORDER — INSULIN ASPART 100 UNIT/ML ~~LOC~~ SOLN: 0.0000 [IU] | Freq: Three times a day (TID) | SUBCUTANEOUS | Status: DC

## 2019-12-22 MED ORDER — LEVOTHYROXINE SODIUM 50 MCG PO TABS
50.0000 ug | ORAL_TABLET | Freq: Every day | ORAL | Status: DC
  Administered 2019-12-24 – 2019-12-27 (×4): 50 ug via ORAL
  Filled 2019-12-22 (×4): qty 1

## 2019-12-22 MED ORDER — INSULIN GLARGINE 100 UNIT/ML ~~LOC~~ SOLN
40.0000 [IU] | Freq: Every day | SUBCUTANEOUS | Status: DC
  Administered 2019-12-22: 40 [IU] via SUBCUTANEOUS
  Filled 2019-12-22 (×2): qty 0.4

## 2019-12-22 MED ORDER — PANTOPRAZOLE SODIUM 40 MG IV SOLR
40.0000 mg | Freq: Once | INTRAVENOUS | Status: AC
  Administered 2019-12-22: 40 mg via INTRAVENOUS
  Filled 2019-12-22: qty 40

## 2019-12-22 MED ORDER — INSULIN ASPART 100 UNIT/ML ~~LOC~~ SOLN: 0.0000 [IU] | Freq: Every day | SUBCUTANEOUS | Status: DC

## 2019-12-22 MED ORDER — ONDANSETRON HCL 4 MG/2ML IJ SOLN
4.0000 mg | Freq: Four times a day (QID) | INTRAMUSCULAR | Status: DC | PRN
  Administered 2019-12-23: 4 mg via INTRAVENOUS
  Filled 2019-12-22: qty 2

## 2019-12-22 MED ORDER — PREDNISONE 5 MG PO TABS
5.0000 mg | ORAL_TABLET | ORAL | Status: DC
  Administered 2019-12-23 – 2019-12-27 (×3): 5 mg via ORAL
  Filled 2019-12-22 (×4): qty 1

## 2019-12-22 MED ORDER — NICOTINE 14 MG/24HR TD PT24
14.0000 mg | MEDICATED_PATCH | Freq: Every day | TRANSDERMAL | Status: DC
  Administered 2019-12-22 – 2019-12-27 (×6): 14 mg via TRANSDERMAL
  Filled 2019-12-22 (×6): qty 1

## 2019-12-22 MED ORDER — ACETAMINOPHEN 650 MG RE SUPP: 650.0000 mg | Freq: Four times a day (QID) | RECTAL | Status: DC | PRN

## 2019-12-22 MED ORDER — SODIUM CHLORIDE 0.9 % IV SOLN: 10.0000 mL/h | Freq: Once | INTRAVENOUS | Status: DC

## 2019-12-22 MED ORDER — SODIUM CHLORIDE 0.9 % IV SOLN
8.0000 mg/h | INTRAVENOUS | Status: DC
  Administered 2019-12-22: 8 mg/h via INTRAVENOUS
  Filled 2019-12-22 (×4): qty 80

## 2019-12-22 MED ORDER — PANTOPRAZOLE SODIUM 40 MG IV SOLR: 40.0000 mg | Freq: Two times a day (BID) | INTRAVENOUS | Status: DC

## 2019-12-22 MED ORDER — SODIUM CHLORIDE 0.9 % IV BOLUS
500.0000 mL | Freq: Once | INTRAVENOUS | Status: AC
  Administered 2019-12-22: 500 mL via INTRAVENOUS

## 2019-12-22 NOTE — H&P (View-Only) (Signed)
[facility] Consultation Note  Referring Provider: [facility] Primary Care Physician:  Ivy Lynn, NP  Reason for Consultation:  GI bleeding  HPI: Victor Schultz is a 69 y.o. male with multiple medical problems including diabetes, kidney transplantation.  He presents with couple day history of coffee ground emesis, black emesis, melena.  No abdominal pain.  Aspirin but no other blood thinners.  No abdominal pain or unintentional weight loss.  No prior history of GI bleeding.  Does not recall having previously had endoscopy.   Past Medical History:  Diagnosis Date  . Atherosclerotic heart disease of native coronary artery with unstable angina pectoris ([place])   . Atrophy of thyroid (acquired)   . History of end stage renal disease   . Hypertensive heart and chronic kidney disease without heart failure, with stage 5 chronic kidney disease, or end stage renal disease ([place])   . Kidney transplant status   . Long term (current) use of insulin ([place])   . Mixed hyperlipidemia   . Type 2 diabetes mellitus with diabetic chronic kidney disease [facility])     Past Surgical History:  Procedure Laterality Date  . CATARACT EXTRACTION    . CORONARY ARTERY BYPASS GRAFT    . [place]    Prior to Admission medications   Medication Sig Start Date End Date Taking? Authorizing Provider  aspirin 81 MG EC tablet Take 81 mg by mouth daily.   Yes [provider]  atorvastatin (LIPITOR) 20 MG tablet Take 1 tablet (20 mg total) by mouth daily. Patient taking differently: Take 10 mg by mouth daily.  12/08/19  Yes Ivy Lynn, NP  azaTHIOprine (IMURAN) 50 MG tablet Take 125 mg by mouth daily. Taking 2.5 tablets 11/30/19  Yes [provider]  LANTUS SOLOSTAR 100 UNIT/ML Solostar Pen Inject 40 Units into the skin at bedtime.  10/11/19  Yes [provider]  levothyroxine (SYNTHROID) 50 MCG tablet Take 50 mcg by mouth daily. 10/30/19  Yes [provider]  metoprolol tartrate (LOPRESSOR) 25 MG tablet Take 12.5 mg by mouth 2 (two) times daily. 10/30/19  Yes [provider]  predniSONE (DELTASONE) 5 MG tablet Take 5 mg by mouth every other day. 10/30/19  Yes [provider]  sodium bicarbonate 650 MG tablet Take 650 mg by mouth 2 (two) times daily. 10/05/19  Yes [provider]  Vitamin D, Ergocalciferol, (DRISDOL) 1.25 MG (50000 UNIT) CAPS capsule Take 50,000 Units by mouth once a week. 07/25/19  Yes [provider]  B-D UF III MINI PEN NEEDLES 31G X 5 MM MISC USE WITH LANTUS AS DIRECTED 09/17/19   [provider]  icosapent Ethyl (VASCEPA) 1 g capsule Take 2 capsules (2 g total) by mouth 2 (two) times daily. Patient not taking: Reported on 12/22/2019 12/08/19   Ivy Lynn, NP  pantoprazole (PROTONIX) 40 MG tablet Take 1 tablet (40 mg total) by mouth daily. Patient not taking: Reported on 12/22/2019 12/06/19   Ivy Lynn, NP    No current facility-administered medications for this encounter.   Current Outpatient Medications  Medication Sig Dispense Refill  . aspirin 81 MG EC tablet Take 81 mg by mouth daily.    [name] atorvastatin (LIPITOR) 20 MG tablet Take 1 tablet (20 mg total) by mouth daily. (Patient taking differently: Take 10 mg by mouth daily. ) 90 tablet 3  . azaTHIOprine (IMURAN) 50 MG tablet Take 125 mg by mouth daily. Taking 2.5  tablets    . LANTUS SOLOSTAR 100 UNIT/ML Solostar Pen Inject 40 Units into the skin at bedtime.     [name] levothyroxine (SYNTHROID) 50 MCG tablet Take 50 mcg by mouth daily.    . metoprolol tartrate (LOPRESSOR) 25 MG tablet Take 12.5 mg by mouth 2 (two) times daily.    . predniSONE (DELTASONE) 5 MG tablet Take 5 mg by mouth every other day.    . sodium bicarbonate 650 MG tablet Take 650 mg by mouth 2 (two) times daily.    . Vitamin D, Ergocalciferol, (DRISDOL) 1.25 MG (50000 UNIT) CAPS capsule Take 50,000 Units by mouth once a week.    . B-D UF III MINI  PEN NEEDLES 31G X 5 MM MISC USE WITH LANTUS AS DIRECTED    . icosapent Ethyl (VASCEPA) 1 g capsule Take 2 capsules (2 g total) by mouth 2 (two) times daily. (Patient not taking: Reported on 12/22/2019) 360 capsule 0  . pantoprazole (PROTONIX) 40 MG tablet Take 1 tablet (40 mg total) by mouth daily. (Patient not taking: Reported on 12/22/2019) 30 tablet 0    Allergies as of 12/22/2019  . (No Known Allergies)    Family History  Problem Relation Age of Onset  . Liver disease Mother   . Alzheimer's disease Father     Social History   Socioeconomic History  . Marital status: Divorced    Spouse name: Not on file  . Number of children: 1  . Years of education: Not on file  . Highest education level: Not on file  Occupational History  . Occupation: retired  Tobacco Use  . Smoking status: Current Every Day Smoker    Packs/day: 0.75    Years: 39.00    Pack years: 29.25    Types: Cigarettes  . Smokeless tobacco: Never Used  Substance and Sexual Activity  . Alcohol use: Never  . Drug use: Never  . Sexual activity: Not on file  Other Topics Concern  . Not on file  Social History Narrative  . Not on file   Social Determinants of Health   Financial Resource Strain:   . Difficulty of Paying Living Expenses:   Food Insecurity:   . Worried About Charity fundraiser in the Last Year:   . Arboriculturist in the Last Year:   Transportation Needs:   . Film/video editor (Medical):   [name] Lack of Transportation (Non-Medical):   Physical Activity:   . Days of Exercise per Week:   . Minutes of Exercise per Session:   Stress:   . Feeling of Stress :   Social Connections:   . Frequency of Communication with Friends and Family:   . Frequency of Social Gatherings with Friends and Family:   . Attends Religious Services:   . Active Member of Clubs or Organizations:   . Attends Archivist Meetings:   [name] Marital Status:   Intimate Partner Violence:   . Fear of Current or  Ex-Partner:   . Emotionally Abused:   [name] Physically Abused:   . Sexually Abused:     Review of Systems: As per HPI, all others negative.  Physical Exam: Vital signs in last 24 hours: Temp:  [97.7 F (36.5 C)-97.9 F (36.6 C)] 97.9 F (36.6 C) (03/17 1535) Pulse Rate:  [61-117] 117 (03/17 1535) Resp:  [18-30] 24 (03/17 1535) BP: (93-140)/(44-70) 109/55 (03/17 1535) SpO2:  [96 %-100 %] 100 % (03/17 1535) Weight:  [75 kg] 75 kg (03/17  1236)   General:   Alert,  Well-developed, well-nourished, pleasant and cooperative in NAD, chronically ill-appearing Head:  Normocephalic and atraumatic. Eyes:  Sclera clear, no icterus.   Conjunctiva pink. Ears:  Normal auditory acuity. Nose:  No deformity, discharge,  or lesions. Mouth:  No deformity or lesions.  Oropharynx pink & moist. Neck:  Supple; no masses or thyromegaly. Lungs:  Clear throughout to auscultation.   No wheezes, crackles, or rhonchi. No acute distress. Heart:  Regular rate and rhythm; no murmurs, clicks, rubs,  or gallops. Abdomen:  Soft, nontender and nondistended. No masses, hepatosplenomegaly or hernias noted. Normal bowel sounds, without guarding, and without rebound.     Msk:  Symmetrical without gross deformities. Normal posture. Pulses:  Normal pulses noted. Extremities:  Without clubbing or edema. Neurologic:  Alert and  oriented x4;  grossly normal neurologically. Skin:  Scattered ecchymoses and telangiectasias, otherwise intact without significant lesions or rashes. Psych:  Alert and cooperative. Normal mood and affect.   Lab Results: Recent Labs    12/22/19 1240  WBC 11.6*  HGB 7.9*  HCT 26.0*  PLT 329   BMET Recent Labs    12/22/19 1240  NA 140  K 6.1*  CL 109  CO2 18*  GLUCOSE 131*  BUN 76*  CREATININE 3.56*  CALCIUM 7.7*   LFT Recent Labs    12/22/19 1240  PROT 5.4*  ALBUMIN 1.7*  AST 32  ALT 27  ALKPHOS 51  BILITOT 0.7   PT/INR No results for input(s): LABPROT, INR in the last 72  hours.  Studies/Results: DG Chest Port 1 View  Result Date: 12/22/2019 CLINICAL DATA:  Weakness, shortness of breath EXAM: PORTABLE CHEST 1 VIEW COMPARISON:  None. FINDINGS: Prior CABG. Cardiomegaly. Low lung volumes. Bibasilar scarring or atelectasis. No effusions or acute bony abnormality. IMPRESSION: Cardiomegaly.  Bibasilar scarring or atelectasis. Electronically Signed   By: Rolm Baptise M.D.   On: 12/22/2019 14:33    Impression:  1.  Melena and coffee ground emesis. 2.  Acute on chronic anemia, with some acute blood loss component. 3.  Multiple medical problems including diabetes and kidney transplantation. 4.  Hyperkalemia (6.1).  Plan:  1.  Serial CBCs, transfuse as needed, IV fluids. 2.  PPI. 3.  Correct hyperkalemia. 4.  Endoscopy tomorrow. 5.  Next step in management pending endoscopy findings. 6.  Risks (bleeding, infection, bowel perforation that could require surgery, sedation-related changes in cardiopulmonary systems), benefits (identification and possible treatment of source of symptoms, exclusion of certain causes of symptoms), and alternatives (watchful waiting, radiographic imaging studies, empiric medical treatment) of upper endoscopy (EGD) were explained to patient/family in detail and patient wishes to proceed.   LOS: 0 days   Danaiya Steadman M  12/22/2019, 4:03 PM  Cell [phone] If no answer or after 5 PM call [phone]

## 2019-12-22 NOTE — ED Notes (Signed)
Spoke with attending on call, Baltazar Najjar, aware of pts repeat H&H. Hold 2nd transfusion at this time. If pt has more episodes of bloody BM/vomiting repage. Aware of 120-130 afib HR. Metoprolol to be administered tonight. Will make floor nurse aware.

## 2019-12-22 NOTE — ED Provider Notes (Addendum)
[facility] Provider Note   CSN: [id number] Arrival date & time: 12/22/19  1229     History Chief Complaint  Patient presents with  . Hematemesis    Victor Schultz is a 69 y.o. male.  69 year old male with prior medical history as detailed below presents for evaluation of suspected GI bleed.  Patient ports dark tarry stool over the last 48 hours.  Patient reports 2 emesis episodes this morning with production of dark blood.  He reports feeling dizzy and lightheaded this afternoon.  Patient denies associated abdominal pain, chest pain, shortness of breath, fevers.  He denies prior history of GI bleed.  The history is provided by the patient and medical records.  Illness Location:  Suspected GI bleed Severity:  Moderate Onset quality:  Gradual Duration:  2 days Timing:  Rare Progression:  Unchanged Chronicity:  New Associated symptoms: no abdominal pain, no chest pain and no shortness of breath        Past Medical History:  Diagnosis Date  . Atherosclerotic heart disease of native coronary artery with unstable angina pectoris ([place])   . Atrophy of thyroid (acquired)   . History of end stage renal disease   . Hypertensive heart and chronic kidney disease without heart failure, with stage 5 chronic kidney disease, or end stage renal disease ([place])   . Kidney transplant status   . Long term (current) use of insulin ([place])   . Mixed hyperlipidemia   . Type 2 diabetes mellitus with diabetic chronic kidney disease [facility])     Patient Active Problem List   Diagnosis Date Noted  . Abnormal hemoglobin (Hgb) ([place]) 12/08/2019  . Serum potassium elevated 12/08/2019  . Type 2 diabetes mellitus with chronic kidney disease, without long-term current use of insulin ([place]) 12/06/2019  . Mixed hyperlipidemia 12/06/2019  . Gastroesophageal reflux disease without esophagitis 12/06/2019  . CAD in native artery 02/08/2016  . Current every day smoker  02/08/2016  . S/P CABG x 3 02/08/2016  . Vitamin D deficiency 02/08/2016  . Hypoplastic kidney 01/31/2016  . Squamous cell carcinoma of skin of ear and external auditory canal 11/16/2010  . Personal history of malignant neoplasm of skin 10/09/2010    Past Surgical History:  Procedure Laterality Date  . CATARACT EXTRACTION    . CORONARY ARTERY BYPASS GRAFT    . KIDNEY TRANSPLANT         Family History  Problem Relation Age of Onset  . Liver disease Mother   . Alzheimer's disease Father     Social History   Tobacco Use  . Smoking status: Former Smoker    Types: Cigarettes    Quit date: 06/2012    Years since quitting: 7.5  . Smokeless tobacco: Never Used  Substance Use Topics  . Alcohol use: Never  . Drug use: Never    Home Medications Prior to Admission medications   Medication Sig Start Date End Date Taking? Authorizing Provider  aspirin 81 MG EC tablet Take 81 mg by mouth daily.   Yes [provider]  atorvastatin (LIPITOR) 20 MG tablet Take 1 tablet (20 mg total) by mouth daily. Patient taking differently: Take 10 mg by mouth daily.  12/08/19  Yes Ivy Lynn, NP  azaTHIOprine (IMURAN) 50 MG tablet Take 125 mg by mouth daily. Taking 2.5 tablets 11/30/19  Yes [provider]  LANTUS SOLOSTAR 100 UNIT/ML Solostar Pen Inject 40 Units into the skin at bedtime.  10/11/19  Yes [provider]  levothyroxine (SYNTHROID) 50 MCG tablet Take 50 mcg by mouth daily. 10/30/19  Yes [provider]  metoprolol tartrate (LOPRESSOR) 25 MG tablet Take 12.5 mg by mouth 2 (two) times daily. 10/30/19  Yes [provider]  predniSONE (DELTASONE) 5 MG tablet Take 5 mg by mouth every other day. 10/30/19  Yes [provider]  sodium bicarbonate 650 MG tablet Take 650 mg by mouth 2 (two) times daily. 10/05/19  Yes [provider]  Vitamin D, Ergocalciferol, (DRISDOL) 1.25 MG (50000 UNIT) CAPS capsule Take 50,000 Units by mouth once a  week. 07/25/19  Yes [provider]  B-D UF III MINI PEN NEEDLES 31G X 5 MM MISC USE WITH LANTUS AS DIRECTED 09/17/19   [provider]  icosapent Ethyl (VASCEPA) 1 g capsule Take 2 capsules (2 g total) by mouth 2 (two) times daily. Patient not taking: Reported on 12/22/2019 12/08/19   Ivy Lynn, NP  pantoprazole (PROTONIX) 40 MG tablet Take 1 tablet (40 mg total) by mouth daily. Patient not taking: Reported on 12/22/2019 12/06/19   Ivy Lynn, NP    Allergies    Patient has no known allergies.  Review of Systems   Review of Systems  Respiratory: Negative for shortness of breath.   Cardiovascular: Negative for chest pain.  Gastrointestinal: Negative for abdominal pain.  All other systems reviewed and are negative.   Physical Exam Updated Vital Signs BP (!) 117/52   Pulse (!) 115   Temp 97.7 F (36.5 C) (Oral)   Resp (!) 27   Ht 5\' 4"  (1.626 m)   Wt 75 kg   SpO2 100%   BMI 28.38 kg/m   Physical Exam Vitals and nursing note reviewed.  Constitutional:      General: He is not in acute distress.    Appearance: Normal appearance. He is well-developed.  HENT:     Head: Normocephalic and atraumatic.  Eyes:     Conjunctiva/sclera: Conjunctivae normal.     Pupils: Pupils are equal, round, and reactive to light.  Cardiovascular:     Rate and Rhythm: Normal rate and regular rhythm.     Heart sounds: Normal heart sounds.  Pulmonary:     Effort: Pulmonary effort is normal. No respiratory distress.     Breath sounds: Normal breath sounds.  Abdominal:     General: There is no distension.     Palpations: Abdomen is soft.     Tenderness: There is no abdominal tenderness.  Genitourinary:    Comments: Dark melanotic stool present on exam Musculoskeletal:        General: No deformity. Normal range of motion.     Cervical back: Normal range of motion and neck supple.  Skin:    General: Skin is warm and dry.  Neurological:     Mental Status: He is alert  and oriented to person, place, and time.     ED Results / Procedures / Treatments   Labs (all labs ordered are listed, but only abnormal results are displayed) Labs Reviewed  COMPREHENSIVE METABOLIC PANEL - Abnormal; Notable for the following components:      Result Value   Potassium 6.1 (*)    CO2 18 (*)    Glucose, Bld 131 (*)    BUN 76 (*)    Creatinine, Ser 3.56 (*)    Calcium 7.7 (*)    Total Protein 5.4 (*)    Albumin 1.7 (*)    GFR calc non Af Victor Schultz  16 (*)    GFR calc Af Amer 19 (*)    All other components within normal limits  CBC - Abnormal; Notable for the following components:   WBC 11.6 (*)    RBC 2.30 (*)    Hemoglobin 7.9 (*)    HCT 26.0 (*)    MCV 113.0 (*)    MCH 34.3 (*)    nRBC 0.5 (*)    All other components within normal limits  POC OCCULT BLOOD, ED - Abnormal; Notable for the following components:   Fecal Occult Bld POSITIVE (*)    All other components within normal limits  POC SARS CORONAVIRUS 2 AG -  ED - Abnormal; Notable for the following components:   SARS Coronavirus 2 Ag POSITIVE (*)    All other components within normal limits  CBG MONITORING, ED  TYPE AND SCREEN  ABO/RH  PREPARE RBC (CROSSMATCH)    EKG EKG Interpretation  Date/Time:  Wednesday December 22 2019 12:34:26 EDT Ventricular Rate:  110 PR Interval:    QRS Duration: 76 QT Interval:  338 QTC Calculation: 458 R Axis:   12 Text Interpretation: Sinus tachycardia Probable LVH with secondary repol abnrm Anterior Q waves, possibly due to LVH Confirmed by Victor Schultz [phone]) on 12/22/2019 12:56:42 PM   Radiology DG Chest Port 1 View  Result Date: 12/22/2019 CLINICAL DATA:  Weakness, shortness of breath EXAM: PORTABLE CHEST 1 VIEW COMPARISON:  None. FINDINGS: Prior CABG. Cardiomegaly. Low lung volumes. Bibasilar scarring or atelectasis. No effusions or acute bony abnormality. IMPRESSION: Cardiomegaly.  Bibasilar scarring or atelectasis. Electronically Signed   By: Rolm Baptise M.D.    On: 12/22/2019 14:33    Procedures Procedures (including critical care time) CRITICAL CARE Performed by: Valarie Merino   Total critical care time: 30  minutes  Critical care time was exclusive of separately billable procedures and treating other patients.  Critical care was necessary to treat or prevent imminent or life-threatening deterioration.  Critical care was time spent personally by me on the following activities: development of treatment plan with patient and/or surrogate as well as nursing, discussions with consultants, evaluation of patient's response to treatment, examination of patient, obtaining history from patient or surrogate, ordering and performing treatments and interventions, ordering and review of laboratory studies, ordering and review of radiographic studies, pulse oximetry and re-evaluation of patient's condition.    Medications Ordered in ED Medications  pantoprazole (PROTONIX) injection 40 mg (40 mg Intravenous Given 12/22/19 1317)  ondansetron (ZOFRAN) injection 4 mg (4 mg Intravenous Given 12/22/19 1318)  sodium chloride 0.9 % bolus 500 mL (0 mLs Intravenous Stopped 12/22/19 1451)  cefTRIAXone (ROCEPHIN) 1 g in sodium chloride 0.9 % 100 mL IVPB (0 g Intravenous Stopped 12/22/19 1439)  insulin aspart (novoLOG) injection 5 Units (5 Units Intravenous Given 12/22/19 1458)    And  dextrose 50 % solution 50 mL (50 mLs Intravenous Given 12/22/19 1458)    ED Course  I have reviewed the triage vital signs and the nursing notes.  Pertinent labs & imaging results that were available during my care of the patient were reviewed by me and considered in my medical decision making (see chart for details).    MDM Rules/Calculators/A&P                      MDM  Screen complete  Nachman Sundt was evaluated in Emergency Department on 12/22/2019 for the symptoms described in the history of present illness. He was evaluated  in the context of the global COVID-19 pandemic,  which necessitated consideration that the patient might be at risk for infection with the SARS-CoV-2 virus that causes COVID-19. Institutional protocols and algorithms that pertain to the evaluation of patients at risk for COVID-19 are in a state of rapid change based on information released by regulatory bodies including the CDC and federal and state organizations. These policies and algorithms were followed during the patient's care in the ED.  69 year old male is presenting with suspected GI bleed.  Labs new anemia and melanotic stool that is guaiac positive found on exam.  Hemoglobin appears to be 7.9.  Patient given Protonix, antibiotics, and transfusion initiated.  Dr. Paulita Schultz from GI will evaluate patient.  Hospitalist service is aware of case and will evaluate for admission.     Final Clinical Impression(s) / ED Diagnoses Final diagnoses:  Gastrointestinal hemorrhage, unspecified gastrointestinal hemorrhage type    Rx / DC Orders ED Discharge Orders    None       Valarie Merino, MD 12/22/19 1511    Valarie Merino, MD 12/22/19 1616

## 2019-12-22 NOTE — Plan of Care (Signed)

## 2019-12-22 NOTE — ED Notes (Signed)
Page out to admitting regarding repeat H&H and determine if pt needs 2nd unit of blood

## 2019-12-22 NOTE — ED Notes (Signed)
One episode of very small amount of black loose stool.

## 2019-12-22 NOTE — ED Notes (Signed)
Covid positive result from mini lab was an error. Pt was not POC covid swabbed.

## 2019-12-22 NOTE — Consult Note (Signed)
[facility] Consultation Note  Referring Provider: [facility] Primary Care Physician:  Ivy Lynn, NP  Reason for Consultation:  GI bleeding  HPI: Victor Schultz is a 69 y.o. male with multiple medical problems including diabetes, kidney transplantation.  He presents with couple day history of coffee ground emesis, black emesis, melena.  No abdominal pain.  Aspirin but no other blood thinners.  No abdominal pain or unintentional weight loss.  No prior history of GI bleeding.  Does not recall having previously had endoscopy.   Past Medical History:  Diagnosis Date  . Atherosclerotic heart disease of native coronary artery with unstable angina pectoris ([place])   . Atrophy of thyroid (acquired)   . History of end stage renal disease   . Hypertensive heart and chronic kidney disease without heart failure, with stage 5 chronic kidney disease, or end stage renal disease ([place])   . Kidney transplant status   . Long term (current) use of insulin ([place])   . Mixed hyperlipidemia   . Type 2 diabetes mellitus with diabetic chronic kidney disease [facility])     Past Surgical History:  Procedure Laterality Date  . CATARACT EXTRACTION    . CORONARY ARTERY BYPASS GRAFT    . [place]    Prior to Admission medications   Medication Sig Start Date End Date Taking? Authorizing Provider  aspirin 81 MG EC tablet Take 81 mg by mouth daily.   Yes [provider]  atorvastatin (LIPITOR) 20 MG tablet Take 1 tablet (20 mg total) by mouth daily. Patient taking differently: Take 10 mg by mouth daily.  12/08/19  Yes Ivy Lynn, NP  azaTHIOprine (IMURAN) 50 MG tablet Take 125 mg by mouth daily. Taking 2.5 tablets 11/30/19  Yes [provider]  LANTUS SOLOSTAR 100 UNIT/ML Solostar Pen Inject 40 Units into the skin at bedtime.  10/11/19  Yes [provider]  levothyroxine (SYNTHROID) 50 MCG tablet Take 50 mcg by mouth daily. 10/30/19  Yes [provider]  metoprolol tartrate (LOPRESSOR) 25 MG tablet Take 12.5 mg by mouth 2 (two) times daily. 10/30/19  Yes [provider]  predniSONE (DELTASONE) 5 MG tablet Take 5 mg by mouth every other day. 10/30/19  Yes [provider]  sodium bicarbonate 650 MG tablet Take 650 mg by mouth 2 (two) times daily. 10/05/19  Yes [provider]  Vitamin D, Ergocalciferol, (DRISDOL) 1.25 MG (50000 UNIT) CAPS capsule Take 50,000 Units by mouth once a week. 07/25/19  Yes [provider]  B-D UF III MINI PEN NEEDLES 31G X 5 MM MISC USE WITH LANTUS AS DIRECTED 09/17/19   [provider]  icosapent Ethyl (VASCEPA) 1 g capsule Take 2 capsules (2 g total) by mouth 2 (two) times daily. Patient not taking: Reported on 12/22/2019 12/08/19   Ivy Lynn, NP  pantoprazole (PROTONIX) 40 MG tablet Take 1 tablet (40 mg total) by mouth daily. Patient not taking: Reported on 12/22/2019 12/06/19   Ivy Lynn, NP    No current facility-administered medications for this encounter.   Current Outpatient Medications  Medication Sig Dispense Refill  . aspirin 81 MG EC tablet Take 81 mg by mouth daily.    [name] atorvastatin (LIPITOR) 20 MG tablet Take 1 tablet (20 mg total) by mouth daily. (Patient taking differently: Take 10 mg by mouth daily. ) 90 tablet 3  . azaTHIOprine (IMURAN) 50 MG tablet Take 125 mg by mouth daily. Taking 2.5  tablets    . LANTUS SOLOSTAR 100 UNIT/ML Solostar Pen Inject 40 Units into the skin at bedtime.     [name] levothyroxine (SYNTHROID) 50 MCG tablet Take 50 mcg by mouth daily.    . metoprolol tartrate (LOPRESSOR) 25 MG tablet Take 12.5 mg by mouth 2 (two) times daily.    . predniSONE (DELTASONE) 5 MG tablet Take 5 mg by mouth every other day.    . sodium bicarbonate 650 MG tablet Take 650 mg by mouth 2 (two) times daily.    . Vitamin D, Ergocalciferol, (DRISDOL) 1.25 MG (50000 UNIT) CAPS capsule Take 50,000 Units by mouth once a week.    . B-D UF III MINI  PEN NEEDLES 31G X 5 MM MISC USE WITH LANTUS AS DIRECTED    . icosapent Ethyl (VASCEPA) 1 g capsule Take 2 capsules (2 g total) by mouth 2 (two) times daily. (Patient not taking: Reported on 12/22/2019) 360 capsule 0  . pantoprazole (PROTONIX) 40 MG tablet Take 1 tablet (40 mg total) by mouth daily. (Patient not taking: Reported on 12/22/2019) 30 tablet 0    Allergies as of 12/22/2019  . (No Known Allergies)    Family History  Problem Relation Age of Onset  . Liver disease Mother   . Alzheimer's disease Father     Social History   Socioeconomic History  . Marital status: Divorced    Spouse name: Not on file  . Number of children: 1  . Years of education: Not on file  . Highest education level: Not on file  Occupational History  . Occupation: retired  Tobacco Use  . Smoking status: Current Every Day Smoker    Packs/day: 0.75    Years: 39.00    Pack years: 29.25    Types: Cigarettes  . Smokeless tobacco: Never Used  Substance and Sexual Activity  . Alcohol use: Never  . Drug use: Never  . Sexual activity: Not on file  Other Topics Concern  . Not on file  Social History Narrative  . Not on file   Social Determinants of Health   Financial Resource Strain:   . Difficulty of Paying Living Expenses:   Food Insecurity:   . Worried About Charity fundraiser in the Last Year:   . Arboriculturist in the Last Year:   Transportation Needs:   . Film/video editor (Medical):   [name] Lack of Transportation (Non-Medical):   Physical Activity:   . Days of Exercise per Week:   . Minutes of Exercise per Session:   Stress:   . Feeling of Stress :   Social Connections:   . Frequency of Communication with Friends and Family:   . Frequency of Social Gatherings with Friends and Family:   . Attends Religious Services:   . Active Member of Clubs or Organizations:   . Attends Archivist Meetings:   [name] Marital Status:   Intimate Partner Violence:   . Fear of Current or  Ex-Partner:   . Emotionally Abused:   [name] Physically Abused:   . Sexually Abused:     Review of Systems: As per HPI, all others negative.  Physical Exam: Vital signs in last 24 hours: Temp:  [97.7 F (36.5 C)-97.9 F (36.6 C)] 97.9 F (36.6 C) (03/17 1535) Pulse Rate:  [61-117] 117 (03/17 1535) Resp:  [18-30] 24 (03/17 1535) BP: (93-140)/(44-70) 109/55 (03/17 1535) SpO2:  [96 %-100 %] 100 % (03/17 1535) Weight:  [75 kg] 75 kg (03/17  1236)   General:   Alert,  Well-developed, well-nourished, pleasant and cooperative in NAD, chronically ill-appearing Head:  Normocephalic and atraumatic. Eyes:  Sclera clear, no icterus.   Conjunctiva pink. Ears:  Normal auditory acuity. Nose:  No deformity, discharge,  or lesions. Mouth:  No deformity or lesions.  Oropharynx pink & moist. Neck:  Supple; no masses or thyromegaly. Lungs:  Clear throughout to auscultation.   No wheezes, crackles, or rhonchi. No acute distress. Heart:  Regular rate and rhythm; no murmurs, clicks, rubs,  or gallops. Abdomen:  Soft, nontender and nondistended. No masses, hepatosplenomegaly or hernias noted. Normal bowel sounds, without guarding, and without rebound.     Msk:  Symmetrical without gross deformities. Normal posture. Pulses:  Normal pulses noted. Extremities:  Without clubbing or edema. Neurologic:  Alert and  oriented x4;  grossly normal neurologically. Skin:  Scattered ecchymoses and telangiectasias, otherwise intact without significant lesions or rashes. Psych:  Alert and cooperative. Normal mood and affect.   Lab Results: Recent Labs    12/22/19 1240  WBC 11.6*  HGB 7.9*  HCT 26.0*  PLT 329   BMET Recent Labs    12/22/19 1240  NA 140  K 6.1*  CL 109  CO2 18*  GLUCOSE 131*  BUN 76*  CREATININE 3.56*  CALCIUM 7.7*   LFT Recent Labs    12/22/19 1240  PROT 5.4*  ALBUMIN 1.7*  AST 32  ALT 27  ALKPHOS 51  BILITOT 0.7   PT/INR No results for input(s): LABPROT, INR in the last 72  hours.  Studies/Results: DG Chest Port 1 View  Result Date: 12/22/2019 CLINICAL DATA:  Weakness, shortness of breath EXAM: PORTABLE CHEST 1 VIEW COMPARISON:  None. FINDINGS: Prior CABG. Cardiomegaly. Low lung volumes. Bibasilar scarring or atelectasis. No effusions or acute bony abnormality. IMPRESSION: Cardiomegaly.  Bibasilar scarring or atelectasis. Electronically Signed   By: Rolm Baptise M.D.   On: 12/22/2019 14:33    Impression:  1.  Melena and coffee ground emesis. 2.  Acute on chronic anemia, with some acute blood loss component. 3.  Multiple medical problems including diabetes and kidney transplantation. 4.  Hyperkalemia (6.1).  Plan:  1.  Serial CBCs, transfuse as needed, IV fluids. 2.  PPI. 3.  Correct hyperkalemia. 4.  Endoscopy tomorrow. 5.  Next step in management pending endoscopy findings. 6.  Risks (bleeding, infection, bowel perforation that could require surgery, sedation-related changes in cardiopulmonary systems), benefits (identification and possible treatment of source of symptoms, exclusion of certain causes of symptoms), and alternatives (watchful waiting, radiographic imaging studies, empiric medical treatment) of upper endoscopy (EGD) were explained to patient/family in detail and patient wishes to proceed.   LOS: 0 days   Dameka Younker M  12/22/2019, 4:03 PM  Cell [phone] If no answer or after 5 PM call [phone]

## 2019-12-22 NOTE — ED Triage Notes (Signed)
Pt arrives via EMS from home with reports of coffee ground emesis today. Endorses lightheadedness when standing. Per EMS, BP in recliner was 110/62 and 72/40 in truck. Takes 81 mg ASA daily.

## 2019-12-22 NOTE — H&P (Signed)
History and Physical    Takuya Lariccia DJM:[id number] DOB: 1951-01-02 DOA: 12/22/2019  PCP: Ivy Lynn, NP Consultants:  A[facility] - nephrology; Donnetta Hutching - cardiology Patient coming from:  Home - lives alone; NOK: Step-mother, Elfego Giammarino, [phone]  Chief Complaint: Coffee ground emesis  HPI: Victor Schultz is a 69 y.o. male with medical history significant of DM; HLD: renal transplant with recurrent advanced CKD; HTN; and CAD presenting with coffee ground emesis.  He reports that he was fine yesterday.  He awoke overnight, but fell against the wall.  He was able to make it to the bathroom but had to crawl back to bed.  He was unable to go back to sleep after about 0130.  About 0700. He up to go to the living room.  He called his sister and she came to check on him.  He is too weak to stand.  BP was 72/40 with EMS.  He had 3 dark BMs last night.  Then he had dark emesis x 2 this AM.  He has never had a colonoscopy or EGD in his life.  Renal transplant in 1974 after childhood renal problems.  He started on HD in 1980 and then got his second transplant in 1982.  He has done great until DM and CABG and his kidneys have been failing since then.  He chronically lacks energy, which he thinks may be related to his CKD.   ED Course:   GI bleeding - dark tarry stools x 48 hours, 2 episodes of dark vomiting.  Hgb 7.9, down from 11.1 2 weeks ago.  BUN 76.  Renal transplant, not yet on HD.  Heme positive.  COVID NOT positive - falsely entered.  Given Protonix, Rocephin, IVF.  Transfusing 2 units.  Gi is aware.  Review of Systems: As per HPI; otherwise review of systems reviewed and negative.   Ambulatory Status:  Ambulates without assistance  Past Medical History:  Diagnosis Date  . Atherosclerotic heart disease of native coronary artery with unstable angina pectoris ([place])   . Atrophy of thyroid (acquired)   . History of end stage renal disease   . Hypertensive heart and chronic kidney disease without  heart failure, with stage 5 chronic kidney disease, or end stage renal disease ([place])   . Kidney transplant status   . Long term (current) use of insulin ([place])   . Mixed hyperlipidemia   . Type 2 diabetes mellitus with diabetic chronic kidney disease [facility])     Past Surgical History:  Procedure Laterality Date  . CATARACT EXTRACTION    . CORONARY ARTERY BYPASS GRAFT    . KIDNEY TRANSPLANT     1974, 1982    Social History   Socioeconomic History  . Marital status: Divorced    Spouse name: Not on file  . Number of children: 1  . Years of education: Not on file  . Highest education level: Not on file  Occupational History  . Occupation: retired  Tobacco Use  . Smoking status: Current Every Day Smoker    Packs/day: 0.75    Years: 39.00    Pack years: 29.25    Types: Cigarettes  . Smokeless tobacco: Never Used  Substance and Sexual Activity  . Alcohol use: Never  . Drug use: Never  . Sexual activity: Not on file  Other Topics Concern  . Not on file  Social History Narrative  . Not on file   Social Determinants of Health   Financial Resource Strain:   .  Difficulty of Paying Living Expenses:   Food Insecurity:   . Worried About Charity fundraiser in the Last Year:   . Arboriculturist in the Last Year:   Transportation Needs:   . Film/video editor (Medical):   [name] Lack of Transportation (Non-Medical):   Physical Activity:   . Days of Exercise per Week:   . Minutes of Exercise per Session:   Stress:   . Feeling of Stress :   Social Connections:   . Frequency of Communication with Friends and Family:   . Frequency of Social Gatherings with Friends and Family:   . Attends Religious Services:   . Active Member of Clubs or Organizations:   . Attends Archivist Meetings:   [name] Marital Status:   Intimate Partner Violence:   . Fear of Current or Ex-Partner:   . Emotionally Abused:   [name] Physically Abused:   . Sexually Abused:     No Known  Allergies  Family History  Problem Relation Age of Onset  . Liver disease Mother   . Alzheimer's disease Father     Prior to Admission medications   Medication Sig Start Date End Date Taking? Authorizing Provider  aspirin 81 MG EC tablet Take 81 mg by mouth daily.    [provider]  atorvastatin (LIPITOR) 20 MG tablet Take 1 tablet (20 mg total) by mouth daily. 12/08/19   Ivy Lynn, NP  azaTHIOprine (IMURAN) 50 MG tablet Take 125 mg by mouth daily.  11/30/19   [provider]  B-D UF III MINI PEN NEEDLES 31G X 5 MM MISC USE WITH LANTUS AS DIRECTED 09/17/19   [provider]  icosapent Ethyl (VASCEPA) 1 g capsule Take 2 capsules (2 g total) by mouth 2 (two) times daily. 12/08/19   Ivy Lynn, NP  LANTUS SOLOSTAR 100 UNIT/ML Solostar Pen Inject 40 Units into the skin at bedtime.  10/11/19   [provider]  levothyroxine (SYNTHROID) 50 MCG tablet Take 50 mcg by mouth daily. 10/30/19   [provider]  metoprolol tartrate (LOPRESSOR) 25 MG tablet Take 12.5 mg by mouth 2 (two) times daily. 10/30/19   [provider]  pantoprazole (PROTONIX) 40 MG tablet Take 1 tablet (40 mg total) by mouth daily. 12/06/19   Ivy Lynn, NP  predniSONE (DELTASONE) 5 MG tablet Take 5 mg by mouth every other day. 10/30/19   [provider]  sodium bicarbonate 650 MG tablet Take 650 mg by mouth 2 (two) times daily. 10/05/19   [provider]  Vitamin D, Ergocalciferol, (DRISDOL) 1.25 MG (50000 UNIT) CAPS capsule Take 50,000 Units by mouth once a week. 07/25/19   [provider]    Physical Exam: Vitals:   12/22/19 1530 12/22/19 1535 12/22/19 1545 12/22/19 1600  BP: 120/61 (!) 109/55 (!) 128/54 (!) 154/58  Pulse: (!) 115 (!) 117 (!) 123 (!) 123  Resp: (!) 25 (!) 24 (!) 29 (!) 32  Temp:  97.9 F (36.6 C)    TempSrc:  Oral    SpO2: 100% 100% 100% 100%  Weight:      Height:         . General:  Appears calm and  comfortable and is NAD . Eyes:  PERRL, EOMI, normal lids, iris . ENT:  grossly normal hearing, lips & tongue, mmm . Neck:  no LAD, masses or thyromegaly . Cardiovascular:  RR with tachycardia, hyperdynamic heart sounds. No LE edema.  [name]  Respiratory:   CTA bilaterally with no wheezes/rales/rhonchi.  Normal respiratory effort. . Abdomen:  soft, NT, ND, NABS . Skin:  no rash or induration seen on limited exam . Musculoskeletal:  grossly normal tone BUE/BLE, good ROM, no bony abnormality . Psychiatric:  grossly normal mood and affect, speech fluent and appropriate, AOx3 . Neurologic:  CN 2-12 grossly intact, moves all extremities in coordinated fashion    Radiological Exams on Admission: DG Chest Port 1 View  Result Date: 12/22/2019 CLINICAL DATA:  Weakness, shortness of breath EXAM: PORTABLE CHEST 1 VIEW COMPARISON:  None. FINDINGS: Prior CABG. Cardiomegaly. Low lung volumes. Bibasilar scarring or atelectasis. No effusions or acute bony abnormality. IMPRESSION: Cardiomegaly.  Bibasilar scarring or atelectasis. Electronically Signed   By: Rolm Baptise M.D.   On: 12/22/2019 14:33    EKG: Independently reviewed.  Sinus tachcyardia with rate 110; LVH with no evidence of acute ischemia   Labs on Admission: I have personally reviewed the available labs and imaging studies at the time of the admission.  Pertinent labs:   K+ 6.1 CO2 18 Glucose 131 BUN 76/Creatinine 3.56/GFR 16; 36/2.54/25 on 3/3 Calcium 7.7 Albumin 1.7 WBC 11.6 Hgb 7.9; 11.1 on 3/1 BD COVID POSITIVE Heme positive A1c 6.5 on 3/1 TSH 3.310 on 3/1   Assessment/Plan Principal Problem:   Acute upper GI bleeding Active Problems:   CAD in native artery   Current every day smoker   Type 2 diabetes mellitus with chronic kidney disease, without long-term current use of insulin (HCC)   Mixed hyperlipidemia   Acute kidney injury superimposed on CKD ([place])   Renal transplant recipient   Essential hypertension    Acute  upper GI bleeding -Patient's overnight lightheadedness and fatigue are most likely caused by anemia secondary to upper GI bleeding.  -Patient has no prior h/o GI bleeding and has not had abdominal pain but reports dark tarry stools x 3 yesterday and 2 episodes of dark hematemesis today - likely a duodenal ulcer or less likely gastritis or a gastric ulcer as the cause -His Hgb decreased from 11.1 on 3/1 to 7.9 today. -Type and screen were done in ED.  -Two units of blood were ordered by ED.  -will admit to tele bed -GI consulted by ED, will follow up recommendations -NPO for EGD tomorrow, clear liquids tonight -LR at 75 mL/hr when not receiving blood products -Start IV pantoprazole drip -Zofran IV for nausea -Avoid NSAIDs and SQ heparin -Maintain IV access (2 large bore IVs if possible). -Monitor closely and follow cbc tonight and again in AM, transfuse as necessary for Hgb <7  AKI on stage 4 CKD -Patient with prior h/o transplantation but stable stage 4 CKD -Currently with AKI on stage 4 CKD -IVF repletion as above -Anticipate improvement once hemodynamically stable and Gi bleeding is controlled -Continue bicarbonate -Hyperkalemia is likely related to this issue and is anticipated to improve with IVF; will also give Lokelma -Will recheck BMP with CBC tonight and in AM  S/p renal transplantation -Continue Imuran and prednisone -Current issues appear to be unrelated to this problem  CAD -Hold ASA in the setting of GI bleed  HTN -Continue Lopressor  DM -Recent A1c showed good control -Continue Lantus -Cover with moderate-scale SSI  HLD -Continue Lipitor  Tobacco dependence -Encourage cessation.   -This was discussed with the patient and should be reviewed on an ongoing basis.   -Patch ordered   LAB ERROR -Patient is currently listed as having had a POSITIVE POC test  for COVID-19 -However, the patient DOES NOT currently have a positive COVID test -This lab intended to  enter a positive hemoccult and inadvertently recorded it as a BD COVID test -The patient has not actually been tested for COVID at this time -Patient Placement has been notified -Will perform Hologic testing in anticipation of his procedure tomorrow    Note: This patient has been tested and is pending for the novel coronavirus COVID-19; BD COVID testing was NOT performed in this patient and the positive result is an entry error.  DVT prophylaxis:  SCDs Code Status:  DNR - confirmed with patient Family Communication: None present; he did not request that I speak with his family at the time of admission. Disposition Plan: He is anticipated to d/c to home without [facility] services once his GI issues have been resolved. Consults called: GI Admission status: Admit - It is my clinical opinion that admission to INPATIENT is reasonable and necessary because of the expectation that this patient will require hospital care that crosses at least 2 midnights to treat this condition based on the medical complexity of the problems presented.  Given the aforementioned information, the predictability of an adverse outcome is felt to be significant.     Karmen Bongo MD [facility]   How to contact the [facility] Attending or Consulting provider [place] or covering provider during after hours [place], for this patient?  1. Check the care team in [facility] and look for a) attending/consulting [facility] provider listed and b) the [facility] team listed 2. Log into [email or web address] and use [place]'s universal password to access. If you do not have the password, please contact the hospital operator. 3. Locate the [facility] provider you are looking for under [facility] and page to a number that you can be directly reached. 4. If you still have difficulty reaching the provider, please page the [facility] (Director on Call) for the Hospitalists listed on amion for assistance.   12/22/2019, 4:34 PM

## 2019-12-23 ENCOUNTER — Inpatient Hospital Stay (HOSPITAL_COMMUNITY): Payer: Medicare Other | Admitting: Anesthesiology

## 2019-12-23 ENCOUNTER — Inpatient Hospital Stay (HOSPITAL_COMMUNITY): Payer: Medicare Other

## 2019-12-23 ENCOUNTER — Encounter (HOSPITAL_COMMUNITY): Payer: Self-pay | Admitting: Internal Medicine

## 2019-12-23 ENCOUNTER — Encounter (HOSPITAL_COMMUNITY)
Admission: EM | Disposition: A | Discharge status: Home / Self Care | Payer: Self-pay | Source: Home / Self Care | Attending: Internal Medicine

## 2019-12-23 HISTORY — PX: ESOPHAGOGASTRODUODENOSCOPY (EGD) WITH PROPOFOL: SHX5813

## 2019-12-23 LAB — BASIC METABOLIC PANEL WITH GFR
Anion gap: 13 (ref 5–15)
BUN: 80 mg/dL — ABNORMAL HIGH (ref 8–23)
CO2: 20 mmol/L — ABNORMAL LOW (ref 22–32)
Calcium: 8 mg/dL — ABNORMAL LOW (ref 8.9–10.3)
Chloride: 111 mmol/L (ref 98–111)
Creatinine, Ser: 3.68 mg/dL — ABNORMAL HIGH (ref 0.61–1.24)
GFR calc Af Amer: 18 mL/min — ABNORMAL LOW
GFR calc non Af Amer: 16 mL/min — ABNORMAL LOW
Glucose, Bld: 77 mg/dL (ref 70–99)
Potassium: 4.8 mmol/L (ref 3.5–5.1)
Sodium: 144 mmol/L (ref 135–145)

## 2019-12-23 LAB — GLUCOSE, CAPILLARY
Glucose-Capillary: 167 mg/dL — ABNORMAL HIGH (ref 70–99)
Glucose-Capillary: 91 mg/dL (ref 70–99)
Glucose-Capillary: 63 mg/dL — ABNORMAL LOW (ref 70–99)
Glucose-Capillary: 92 mg/dL (ref 70–99)
Glucose-Capillary: 107 mg/dL — ABNORMAL HIGH (ref 70–99)
Glucose-Capillary: 122 mg/dL — ABNORMAL HIGH (ref 70–99)
Glucose-Capillary: 273 mg/dL — ABNORMAL HIGH (ref 70–99)
Glucose-Capillary: 99 mg/dL (ref 70–99)

## 2019-12-23 LAB — CBC
HCT: 27.7 % — ABNORMAL LOW (ref 39.0–52.0)
HCT: 27.6 % — ABNORMAL LOW (ref 39.0–52.0)
Hemoglobin: 9.2 g/dL — ABNORMAL LOW (ref 13.0–17.0)
Hemoglobin: 9.1 g/dL — ABNORMAL LOW (ref 13.0–17.0)
MCH: 33 pg (ref 26.0–34.0)
MCH: 32.9 pg (ref 26.0–34.0)
MCHC: 33.2 g/dL (ref 30.0–36.0)
MCHC: 33 g/dL (ref 30.0–36.0)
MCV: 99.3 fL (ref 80.0–100.0)
MCV: 99.6 fL (ref 80.0–100.0)
Platelets: 212 10*3/uL (ref 150–400)
Platelets: 193 10*3/uL (ref 150–400)
RBC: 2.79 MIL/uL — ABNORMAL LOW (ref 4.22–5.81)
RBC: 2.77 MIL/uL — ABNORMAL LOW (ref 4.22–5.81)
RDW: 21.4 % — ABNORMAL HIGH (ref 11.5–15.5)
RDW: 21.6 % — ABNORMAL HIGH (ref 11.5–15.5)
WBC: 9.1 10*3/uL (ref 4.0–10.5)
WBC: 7.6 10*3/uL (ref 4.0–10.5)
nRBC: 0.2 % (ref 0.0–0.2)
nRBC: 0.3 % — ABNORMAL HIGH (ref 0.0–0.2)

## 2019-12-23 LAB — URINALYSIS, ROUTINE W REFLEX MICROSCOPIC
Bilirubin Urine: NEGATIVE
Glucose, UA: 150 mg/dL — AB
Ketones, ur: NEGATIVE mg/dL
Leukocytes,Ua: NEGATIVE
Nitrite: NEGATIVE
Protein, ur: >=300 mg/dL — AB
Specific Gravity, Urine: 1.012 (ref 1.005–1.030)
pH: 6 (ref 5.0–8.0)

## 2019-12-23 LAB — PROTIME-INR
INR: 1.2 (ref 0.8–1.2)
Prothrombin Time: 14.8 s (ref 11.4–15.2)

## 2019-12-23 LAB — BRAIN NATRIURETIC PEPTIDE: B Natriuretic Peptide: 65.8 pg/mL (ref 0.0–100.0)

## 2019-12-23 LAB — MAGNESIUM: Magnesium: 1.7 mg/dL (ref 1.7–2.4)

## 2019-12-23 LAB — HIV ANTIBODY (ROUTINE TESTING W REFLEX): HIV Screen 4th Generation wRfx: NONREACTIVE

## 2019-12-23 LAB — POC SARS CORONAVIRUS 2 AG

## 2019-12-23 SURGERY — ESOPHAGOGASTRODUODENOSCOPY (EGD) WITH PROPOFOL
Anesthesia: Monitor Anesthesia Care | Laterality: Left

## 2019-12-23 MED ORDER — DEXTROSE-NACL 5-0.45 % IV SOLN: INTRAVENOUS | Status: AC

## 2019-12-23 MED ORDER — DEXTROSE-NACL 5-0.45 % IV SOLN: INTRAVENOUS | Status: DC

## 2019-12-23 MED ORDER — PROPOFOL 500 MG/50ML IV EMUL
INTRAVENOUS | Status: DC | PRN
  Administered 2019-12-23: 120 ug/kg/min via INTRAVENOUS

## 2019-12-23 MED ORDER — PROPOFOL 10 MG/ML IV BOLUS
INTRAVENOUS | Status: DC | PRN
  Administered 2019-12-23 (×2): 30 mg via INTRAVENOUS

## 2019-12-23 MED ORDER — LIDOCAINE 2% (20 MG/ML) 5 ML SYRINGE
INTRAMUSCULAR | Status: DC | PRN
  Administered 2019-12-23: 30 mg via INTRAVENOUS

## 2019-12-23 MED ORDER — PANTOPRAZOLE SODIUM 40 MG IV SOLR
40.0000 mg | Freq: Two times a day (BID) | INTRAVENOUS | Status: DC
  Administered 2019-12-23 – 2019-12-27 (×8): 40 mg via INTRAVENOUS
  Filled 2019-12-23 (×8): qty 40

## 2019-12-23 MED ORDER — SODIUM CHLORIDE 0.9 % IV SOLN: INTRAVENOUS | Status: DC

## 2019-12-23 MED ORDER — ALBUMIN HUMAN 5 % IV SOLN
INTRAVENOUS | Status: AC
  Filled 2019-12-23: qty 250

## 2019-12-23 MED ORDER — CHLORHEXIDINE GLUCONATE CLOTH 2 % EX PADS
6.0000 | MEDICATED_PAD | Freq: Every day | CUTANEOUS | Status: DC
  Administered 2019-12-23 – 2019-12-26 (×4): 6 via TOPICAL

## 2019-12-23 MED ORDER — PHENYLEPHRINE HCL (PRESSORS) 10 MG/ML IV SOLN
INTRAVENOUS | Status: DC | PRN
  Administered 2019-12-23 (×2): 120 ug via INTRAVENOUS

## 2019-12-23 MED ORDER — ALBUMIN HUMAN 5 % IV SOLN
12.5000 g | Freq: Once | INTRAVENOUS | Status: AC
  Administered 2019-12-23: 12.5 g via INTRAVENOUS

## 2019-12-23 MED ORDER — TAMSULOSIN HCL 0.4 MG PO CAPS
0.4000 mg | ORAL_CAPSULE | Freq: Every day | ORAL | Status: DC
  Administered 2019-12-23 – 2019-12-27 (×5): 0.4 mg via ORAL
  Filled 2019-12-23 (×5): qty 1

## 2019-12-23 MED ORDER — LACTATED RINGERS IV SOLN: INTRAVENOUS | Status: DC | PRN

## 2019-12-23 MED ORDER — INSULIN GLARGINE 100 UNIT/ML ~~LOC~~ SOLN
20.0000 [IU] | Freq: Every day | SUBCUTANEOUS | Status: DC
  Administered 2019-12-23: 20 [IU] via SUBCUTANEOUS
  Filled 2019-12-23 (×3): qty 0.2

## 2019-12-23 MED ORDER — FERROUS SULFATE 325 (65 FE) MG PO TABS
325.0000 mg | ORAL_TABLET | Freq: Three times a day (TID) | ORAL | Status: DC
  Administered 2019-12-23 – 2019-12-27 (×11): 325 mg via ORAL
  Filled 2019-12-23 (×12): qty 1

## 2019-12-23 MED ORDER — LACTATED RINGERS IV SOLN: INTRAVENOUS | Status: DC

## 2019-12-23 MED ORDER — INSULIN ASPART 100 UNIT/ML ~~LOC~~ SOLN
0.0000 [IU] | Freq: Four times a day (QID) | SUBCUTANEOUS | Status: DC
  Administered 2019-12-23: 5 [IU] via SUBCUTANEOUS
  Administered 2019-12-24: 2 [IU] via SUBCUTANEOUS
  Administered 2019-12-24: 1 [IU] via SUBCUTANEOUS
  Administered 2019-12-25 (×2): 3 [IU] via SUBCUTANEOUS
  Administered 2019-12-26: 2 [IU] via SUBCUTANEOUS
  Administered 2019-12-26: 1 [IU] via SUBCUTANEOUS
  Administered 2019-12-26 – 2019-12-27 (×2): 2 [IU] via SUBCUTANEOUS

## 2019-12-23 SURGICAL SUPPLY — 15 items

## 2019-12-23 NOTE — Evaluation (Signed)
Physical Therapy Evaluation Patient Details Name: Victor Schultz MRN: [id number] DOB: [birth year] Today's Date: 12/23/2019   History of Present Illness  69 y.o. male with medical history significant of DM; HLD: renal transplant with recurrent advanced CKD; HTN; and CAD presenting with coffee ground emesis. able to make it to the bathroom but had to crawl back to bed. Admitted 12/22/19 for Acute upper GI bleeding. and AKI on stage 4CKD  Clinical Impression  PTA pt living alone in single story home with 3 steps to enter. Pt reports independence with ambulation and iADLs. Pt currently is limited in safe mobility, by fatigue (which pt reports is due to low Creatine) as well as decreased strength and endurance. Pt is mod I for bed mobility, and min guard for transfers and ambulation of 100 feet without AD, however pt fatigues with distance and increasing instability. PT recommending HHPT to improve strength and endurance. PT will continue to follow acutely.     Follow Up Recommendations Home health PT;Supervision - Intermittent    Equipment Recommendations  None recommended by PT       Precautions / Restrictions Precautions Precautions: Fall Restrictions Weight Bearing Restrictions: No      Mobility  Bed Mobility Overal bed mobility: Modified Independent             General bed mobility comments: With flat bed, pt Modified Independent using bed rails   Transfers Overall transfer level: Needs assistance Equipment used: None Transfers: Sit to/from [employer] Transfers Sit to Stand: Supervision Stand pivot transfers: Supervision;Min guard       General transfer comment: Supervision for toilet transfer, min guard for pivot to recliner without AD  Ambulation/Gait Ambulation/Gait assistance: Min guard Gait Distance (Feet): 100 Feet Assistive device: None Gait Pattern/deviations: Step-through pattern;Decreased step length - right;Decreased step length - left;Shuffle Gait  velocity: slowed Gait velocity interpretation: <1.8 ft/sec, indicate of risk for recurrent falls General Gait Details: min guard for safety with slow, shuffling gait, progressing from stable to mild instability as he fatigues         Balance Overall balance assessment: Needs assistance Sitting-balance support: No upper extremity supported Sitting balance-Leahy Scale: Good     Standing balance support: No upper extremity supported;During functional activity Standing balance-Leahy Scale: Fair                               Pertinent Vitals/Pain Pain Assessment: No/denies pain    Home Living Family/patient expects to be discharged to:: Private residence Living Arrangements: Alone Available Help at Discharge: Family(sister ) Type of Home: House Home Access: Stairs to enter Entrance Stairs-Rails: Can reach both Entrance Stairs-Number of Steps: 3 Home Layout: One level Home Equipment: Shower seat - built in(pt reports built in shower seat too far to reach water)      Prior Function Level of Independence: Independent               Hand Dominance   Dominant Hand: Left    Extremity/Trunk Assessment   Upper Extremity Assessment Upper Extremity Assessment: Defer to OT evaluation    Lower Extremity Assessment Lower Extremity Assessment: Overall WFL for tasks assessed    Cervical / Trunk Assessment Cervical / Trunk Assessment: Normal  Communication   Communication: No difficulties  Cognition Arousal/Alertness: Awake/alert Behavior During Therapy: WFL for tasks assessed/performed Overall Cognitive Status: Within Functional Limits for tasks assessed  General Comments General comments (skin integrity, edema, etc.): BP WFL at 139/72        Assessment/Plan    PT Assessment Patient needs continued PT services  PT Problem List Decreased strength;Decreased activity tolerance;Decreased  balance;Decreased mobility       PT Treatment Interventions DME instruction;Gait training;Stair training;Functional mobility training;Therapeutic activities;Therapeutic exercise;Balance training;Cognitive remediation;Patient/family education    PT Goals (Current goals can be found in the Care Plan section)  Acute Rehab PT Goals Patient Stated Goal: go home PT Goal Formulation: With patient Time For Goal Achievement: 01/06/20 Potential to Achieve Goals: Good    Frequency Min 3X/week   Barriers to discharge Decreased caregiver support      Co-evaluation PT/OT/SLP Co-Evaluation/Treatment: Yes Reason for Co-Treatment: For patient/therapist safety PT goals addressed during session: Mobility/safety with mobility         AM-PAC PT "6 Clicks" Mobility  Outcome Measure Help needed turning from your back to your side while in a flat bed without using bedrails?: None Help needed moving from lying on your back to sitting on the side of a flat bed without using bedrails?: None Help needed moving to and from a bed to a chair (including a wheelchair)?: None Help needed standing up from a chair using your arms (e.g., wheelchair or bedside chair)?: None Help needed to walk in hospital room?: A Little Help needed climbing 3-5 steps with a railing? : A Little 6 Click Score: 22    End of Session Equipment Utilized During Treatment: Gait belt Activity Tolerance: Patient tolerated treatment well Patient left: in chair;with call bell/phone within reach;with chair alarm set Nurse Communication: Mobility status PT Visit Diagnosis: Unsteadiness on feet (R26.81);Other abnormalities of gait and mobility (R26.89);Muscle weakness (generalized) (M62.81);Difficulty in walking, not elsewhere classified (R26.2)    Time: 8938-1017 PT Time Calculation (min) (ACUTE ONLY): 26 min   Charges:   PT Evaluation $PT Eval Moderate Complexity: 1 Mod          Talon Regala B. Migdalia Dk PT, DPT Acute  Rehabilitation Services Pager [phone] Office [phone]    [place] 12/23/2019, 5:26 PM

## 2019-12-23 NOTE — Transfer of Care (Signed)
Immediate Anesthesia Transfer of Care Note  Patient: Victor Schultz  Procedure(s) Performed: ESOPHAGOGASTRODUODENOSCOPY (EGD) WITH PROPOFOL (Left )  Patient Location: Endoscopy Unit  Anesthesia Type:MAC  Level of Consciousness: drowsy  Airway & Oxygen Therapy: Patient Spontanous Breathing and Patient connected to nasal cannula oxygen  Post-op Assessment: Report given to RN and Post -op Vital signs reviewed and stable  Post vital signs: Reviewed and stable  Last Vitals:  Vitals Value Taken Time  BP 77/37 12/23/19 1236  Temp    Pulse 91 12/23/19 1236  Resp 20 12/23/19 1236  SpO2 97 % 12/23/19 1236  Vitals shown include unvalidated device data.  Last Pain:  Vitals:   12/23/19 1158  TempSrc: Temporal  PainSc: 0-No pain         Complications: No apparent anesthesia complications

## 2019-12-23 NOTE — Progress Notes (Signed)
PT Cancellation Note  Patient Details Name: Victor Schultz MRN: [id number] DOB: 1950-10-11   Cancelled Treatment:    Reason Eval/Treat Not Completed: (P) Patient at procedure or test/unavailable PT will follow back this afternoon for Evaluation as able.   Glendell Fouse B. Migdalia Dk PT, DPT Acute Rehabilitation Services Pager [phone] Office [phone]   [place] 12/23/2019, 12:02 PM

## 2019-12-23 NOTE — Anesthesia Preprocedure Evaluation (Addendum)
Anesthesia Evaluation  Patient identified by MRN, date of birth, ID band Patient awake    Reviewed: Allergy & Precautions, H&P , NPO status , Patient's Chart, lab work & pertinent test results, reviewed documented beta blocker date and time   Airway Mallampati: III  TM Distance: >3 FB Neck ROM: Full    Dental no notable dental hx. (+) Edentulous Upper, Edentulous Lower, Dental Advisory Given   Pulmonary Current Smoker and Patient abstained from smoking.,    Pulmonary exam normal breath sounds clear to auscultation       Cardiovascular hypertension, Pt. on medications and Pt. on home beta blockers + CAD   Rhythm:Regular Rate:Normal     Neuro/Psych negative neurological ROS  negative psych ROS   GI/Hepatic Neg liver ROS, GERD  Medicated,  Endo/Other  diabetes, Insulin Dependent  Renal/GU ESRF and DialysisRenal disease  negative genitourinary   Musculoskeletal   Abdominal   Peds  Hematology negative hematology ROS (+)   Anesthesia Other Findings   Reproductive/Obstetrics negative OB ROS                            Anesthesia Physical Anesthesia Plan  ASA: III  Anesthesia Plan: MAC   Post-op Pain Management:    Induction: Intravenous  PONV Risk Score and Plan: 0 and Propofol infusion  Airway Management Planned: Nasal Cannula  Additional Equipment:   Intra-op Plan:   Post-operative Plan:   Informed Consent: I have reviewed the patients History and Physical, chart, labs and discussed the procedure including the risks, benefits and alternatives for the proposed anesthesia with the patient or authorized representative who has indicated his/her understanding and acceptance.   Patient has DNR.  Discussed DNR with patient and Continue DNR.   Dental advisory given  Plan Discussed with: CRNA  Anesthesia Plan Comments:        Anesthesia Quick Evaluation

## 2019-12-23 NOTE — Progress Notes (Signed)
Dr. Therisa Doyne at bedside in endo recovery to see patient. Reviewed blood pressures post procedure. Verbal order receive for albumin 5% IV X1 now. Patient may return to inpatient room follow administration.

## 2019-12-23 NOTE — Interval H&P Note (Signed)
History and Physical Interval Note:  12/23/2019 12:07 PM  Victor Schultz  has presented today for surgery, with the diagnosis of melena, hematemesis, anemia.  The various methods of treatment have been discussed with the patient and family. After consideration of risks, benefits and other options for treatment, the patient has consented to  Procedure(s): ESOPHAGOGASTRODUODENOSCOPY (EGD) WITH PROPOFOL (Left) as a surgical intervention.  The patient's history has been reviewed, patient examined, no change in status, stable for surgery.  I have reviewed the patient's chart and labs.  Questions were answered to the patient's satisfaction.     Landry Dyke

## 2019-12-23 NOTE — Progress Notes (Signed)
PROGRESS NOTE                                                                                                                                                                                                             Patient Demographics:    Victor Schultz, is a 69 y.o. male, DOB - 07-25-1951, LPF:[id number]  Admit date - 12/22/2019   Admitting Physician Karmen Bongo, MD  Outpatient Primary MD for the patient is Ivy Lynn, NP, [facility]; Donnetta Hutching - cardiology  LOS - 1  Chief Complaint  Patient presents with  . Hematemesis       Brief Narrative    Victor Schultz is a 69 y.o. male with medical history significant of DM; HLD: renal transplant with recurrent advanced CKD; HTN; and CAD presenting with coffee ground emesis.  Was diagnosed with active upper GI bleed, ordered 1 unit of packed RBC on IV PPI, GI was consulted and he was admitted for further treatment.  Of note patient never has had EGD or colonoscopy before this admission.   Subjective:    Victor Schultz today has, No headache, No chest pain, No abdominal pain - No Nausea, No new weakness tingling or numbness, No Cough - SOB.     Assessment  & Plan :     1.  Acute blood loss anemia caused by acute upper GI bleed.  Received 1 unit of packed RBC on 12/23/2019, n.p.o. on IV PPI, GI on board going for EGD soon.  EGD for now seems to have stabilized will continue to monitor.  Oral iron supplementation for now.  2.  History of CKD 4 status post 2 kidney transplants.  Follow back [facility], baseline creatinine in chart review appears to be around 2.2, 1-year ago, he certainly could have progressed since then, for now could have had some AKI due to anemia, H&H currently stable, gentle hydration, will monitor bladder scans make sure he is not retaining, will also check UA and renal ultrasound and monitor renal function if needed nephrology will be involved his home medications which are steroid and  azathioprine will be continued.  Home dose sodium bicarbonate and vitamin D supplementation will also be continued.  3.  Dyslipidemia.  On statin.  4.  Hypothyroidism .  Home dose Synthroid.  5.  Hypertension.  Continue home dose beta-blocker.  6.  CAD.  Chest pain-free on combination of aspirin beta-blocker and statin for secondary prevention, aspirin on hold for GI bleed.  7.  Smoking.  Counseled to quit.   8. DM type II.  On Lantus and sliding scale.  Dose adjusted (reduced).  Gentle D5 while he is n.p.o. to avoid hypoglycemia, will monitor closely.  Lab Results  Component Value Date   HGBA1C 6.5 (H) 12/06/2019   CBG (last 3)  Recent Labs    12/23/19 0430 12/23/19 0831 12/23/19 1137  GLUCAP 91 63* 92     Family Communication  :  Judieth Keens - [phone] message left on cell phone 12/23/2019 at 11:52 AM  Code Status :  Full  Disposition Plan  : Stay in the hospital, getting treatment for upper GI bleed requiring blood transfusion, being monitored closely.  Still on IV PPI and H&H being monitored closely.  Consults  :  GI  Procedures  :    EGD -   DVT Prophylaxis  :   SCDs    Lab Results  Component Value Date   PLT 212 12/23/2019    Diet :  Diet Order            Diet NPO time specified Except for: Sips with Meds  Diet effective now        Diet NPO time specified Except for: Sips with Meds  Diet effective now               Inpatient Medications Scheduled Meds: . [MAR Hold] atorvastatin  10 mg Oral Daily  . [MAR Hold] azaTHIOprine  125 mg Oral Daily  . [MAR Hold] ferrous sulfate  325 mg Oral TID WC  . [MAR Hold] insulin aspart  0-9 Units Subcutaneous Q6H  . [MAR Hold] insulin glargine  20 Units Subcutaneous QHS  . [MAR Hold] levothyroxine  50 mcg Oral Q0600  . [MAR Hold] metoprolol tartrate  12.5 mg Oral BID  . [MAR Hold] nicotine  14 mg Transdermal Daily  . [MAR Hold] pantoprazole  40 mg Intravenous Q12H  . [MAR Hold] predniSONE  5 mg Oral QODAY   . [MAR Hold] sodium bicarbonate  650 mg Oral BID  . [MAR Hold] sodium chloride flush  3 mL Intravenous Q12H   Continuous Infusions: . lactated ringers    . pantoprozole (PROTONIX) infusion 8 mg/hr (12/22/19 1809)   PRN Meds:.[MAR Hold] acetaminophen **OR** [DISCONTINUED] acetaminophen, [DISCONTINUED] ondansetron **OR** [MAR Hold] ondansetron (ZOFRAN) IV  Antibiotics  :   Anti-infectives (From admission, onward)   Start     Dose/Rate Route Frequency Ordered Stop   12/22/19 1330  cefTRIAXone (ROCEPHIN) 1 g in sodium chloride 0.9 % 100 mL IVPB     1 g 200 mL/hr over 30 Minutes Intravenous  Once 12/22/19 1320 12/22/19 1439          Objective:   Vitals:   12/22/19 2222 12/22/19 2225 12/23/19 0800 12/23/19 1158  BP:   (!) 154/76 (!) 159/73  Pulse: (!) 101 100 (!) 106   Resp: (!) 23 (!) 23 (!) 22 (!) 25  Temp:    (!) 97.3 F (36.3 C)  TempSrc:    Temporal  SpO2: 100% 100% 100% 100%  Weight:    75.3 kg  Height:    5\' 4"  (1.626 m)    SpO2: 100 %  Wt Readings from Last 3 Encounters:  12/23/19 75.3 kg  12/06/19 75.3 kg     Intake/Output Summary (Last 24 hours) at 12/23/2019 1202 Last data filed at 12/23/2019 0300 Gross per 24 hour  Intake 1586.67 ml  Output --  Net 1586.67 ml  Physical Exam  Awake Alert, No new F.N deficits, Normal affect [place].AT,PERRAL Supple Neck,No JVD, No cervical lymphadenopathy appriciated.  Symmetrical Chest wall movement, Good air movement bilaterally, CTAB RRR,No Gallops,Rubs or new Murmurs, No Parasternal Heave +ve B.Sounds, Abd Soft, No tenderness, No organomegaly appriciated, No rebound - guarding or rigidity. No Cyanosis, Clubbing or edema, No new Rash or bruise       Data Review:    Recent Labs  Lab 12/22/19 1240 12/22/19 1924 12/22/19 2200 12/23/19 0712  WBC 11.6*  --  12.6* 9.1  HGB 7.9* 9.9* 10.0* 9.2*  HCT 26.0* 30.7* 30.4* 27.7*  PLT 329  --  238 212  MCV 113.0*  --  99.7 99.3  MCH 34.3*  --  32.8 33.0  MCHC 30.4   --  32.9 33.2  RDW 14.9  --  20.5* 21.4*    Recent Labs  Lab 12/22/19 1240 12/22/19 2200 12/23/19 0712 12/23/19 0816  NA 140 142 144  --   K 6.1* 4.9 4.8  --   CL 109 110 111  --   CO2 18* 17* 20*  --   GLUCOSE 131* 213* 77  --   BUN 76* 82* 80*  --   CREATININE 3.56* 3.73* 3.68*  --   CALCIUM 7.7* 7.9* 8.0*  --   AST 32  --   --   --   ALT 27  --   --   --   ALKPHOS 51  --   --   --   BILITOT 0.7  --   --   --   ALBUMIN 1.7*  --   --   --   MG  --   --  1.7  --   INR  --   --   --  1.2  BNP  --   --  65.8  --     Recent Labs  Lab 12/22/19 1640 12/23/19 0712  BNP  --  65.8  SARSCOV2NAA NEGATIVE  --     ------------------------------------------------------------------------------------------------------------------ No results for input(s): CHOL, HDL, LDLCALC, TRIG, CHOLHDL, LDLDIRECT in the last 72 hours.  Lab Results  Component Value Date   HGBA1C 6.5 (H) 12/06/2019   ------------------------------------------------------------------------------------------------------------------ No results for input(s): TSH, T4TOTAL, T3FREE, THYROIDAB in the last 72 hours.  Invalid input(s): FREET3 ------------------------------------------------------------------------------------------------------------------ No results for input(s): VITAMINB12, FOLATE, FERRITIN, TIBC, IRON, RETICCTPCT in the last 72 hours.  Coagulation profile Recent Labs  Lab 12/23/19 0816  INR 1.2    No results for input(s): DDIMER in the last 72 hours.  Cardiac Enzymes No results for input(s): CKMB, TROPONINI, MYOGLOBIN in the last 168 hours.  Invalid input(s): CK ------------------------------------------------------------------------------------------------------------------    Component Value Date/Time   BNP 65.8 12/23/2019 6294    Micro Results Recent Results (from the past 240 hour(s))  SARS CORONAVIRUS 2 (TAT 6-24 HRS) Nasopharyngeal Nasopharyngeal Swab     Status: None    Collection Time: 12/22/19  4:40 PM   Specimen: Nasopharyngeal Swab  Result Value Ref Range Status   SARS Coronavirus 2 NEGATIVE NEGATIVE Final    Comment: (NOTE) SARS-CoV-2 target nucleic acids are NOT DETECTED. The SARS-CoV-2 RNA is generally detectable in upper and lower respiratory specimens during the acute phase of infection. Negative results do not preclude SARS-CoV-2 infection, do not rule out co-infections with other pathogens, and should not be used as the sole basis for treatment or other patient management decisions. Negative results must be combined with clinical observations, patient history, and epidemiological information. The  expected result is Negative. Fact Sheet for Patients: SugarRoll.be Fact Sheet for Healthcare Providers: https://www.woods-mathews.com/ This test is not yet approved or cleared by the [place] FDA and  has been authorized for detection and/or diagnosis of SARS-CoV-2 by FDA under an Emergency Use Authorization (EUA). This EUA will remain  in effect (meaning this test can be used) for the duration of the COVID-19 declaration under Section 56 4(b)(1) of the Act, 21 U.S.C. section 360bbb-3(b)(1), unless the authorization is terminated or revoked sooner. Performed at [facility], [place] [address] [place], [place] [address]     Radiology Reports DG Chest [place] 1 View  Result Date: 12/22/2019 CLINICAL DATA:  Weakness, shortness of breath EXAM: PORTABLE CHEST 1 VIEW COMPARISON:  None. FINDINGS: Prior CABG. Cardiomegaly. Low lung volumes. Bibasilar scarring or atelectasis. No effusions or acute bony abnormality. IMPRESSION: Cardiomegaly.  Bibasilar scarring or atelectasis. Electronically Signed   By: Rolm Baptise M.D.   On: 12/22/2019 14:33    Time Spent in minutes  30   Lala Lund M.D on 12/23/2019 at 12:02 PM  To page go to [email or web address] - password [email or web address]

## 2019-12-23 NOTE — Op Note (Signed)
[facility] Patient Name: Victor Schultz Procedure Date : 12/23/2019 MRN: [id number] Attending MD: [name] , MD Date of Birth: [birth year] CSN: [id number] Age: 69 Admit Type: Inpatient Procedure:                Upper GI endoscopy Indications:              Acute post hemorrhagic anemia, Hematemesis, Melena Providers:                [name], MD, Josie Dixon, RN, Marguerita Merles, Technician Referring MD:              Medicines:                Monitored Anesthesia Care Complications:            No immediate complications. Estimated Blood Loss:     Estimated blood loss: none. Procedure:                Pre-Anesthesia Assessment:                           - Prior to the procedure, a History and Physical                            was performed, and patient medications and                            allergies were reviewed. The patient's tolerance of                            previous anesthesia was also reviewed. The risks                            and benefits of the procedure and the sedation                            options and risks were discussed with the patient.                            All questions were answered, and informed consent                            was obtained. Prior Anticoagulants: The patient has                            taken no previous anticoagulant or antiplatelet                            agents except for aspirin. ASA Grade Assessment:                            III - A patient with severe systemic disease. After  reviewing the risks and benefits, the patient was                            deemed in satisfactory condition to undergo the                            procedure.                           After obtaining informed consent, the endoscope was                            passed under direct vision. Throughout the                            procedure, the patient's  blood pressure, pulse, and                            oxygen saturations were monitored continuously. The                            GIF-H190 ([id number] Olympus gastroscope was                            introduced through the mouth, and advanced to the                            second part of duodenum. The upper GI endoscopy was                            accomplished without difficulty. The patient                            tolerated the procedure well. Scope In: Scope Out: Findings:      LA Grade D (one or more mucosal breaks involving at least 75% of       esophageal circumference) esophagitis was found.      Two cratered esophageal ulcers were found 1-2 cm proximal from GE       junction. No stigmata of recent bleeding.      Salmon-colored mucosa was present, suspected Barrett's, in majority of       patient's esophagus.      The exam of the esophagus was otherwise normal.      The entire examined stomach was normal.      The duodenal bulb, first portion of the duodenum and second portion of       the duodenum were normal.      No old or fresh blood seen to the extent of our examination. Impression:               - LA Grade D esophagitis.                           - Esophageal ulcers.                           - Salmon-colored mucosa  suspicious for long-segment                            Barrett's esophagus.                           - Normal stomach.                           - Normal duodenal bulb, first portion of the                            duodenum and second portion of the duodenum.                           - No specimens collected. Moderate Sedation:      None Recommendation:           - Return patient to hospital ward for ongoing care.                           - Clear liquid diet today.                           - Continue present medications.                           [facility] will follow. Procedure Code(s):        --- Professional ---                            [phone], Esophagogastroduodenoscopy, flexible,                            transoral; diagnostic, including collection of                            specimen(s) by brushing or washing, when performed                            (separate procedure) Diagnosis Code(s):        --- Professional ---                           K20.90, Esophagitis, unspecified without bleeding                           K22.10, Ulcer of esophagus without bleeding                           K22.8, Other specified diseases of esophagus                           D62, Acute posthemorrhagic anemia                           K92.0, Hematemesis  K92.1, Melena (includes Hematochezia) CPT copyright 2019 [employer]. All rights reserved. The codes documented in this report are preliminary and upon coder review may  be revised to meet current compliance requirements. [name], MD 12/23/2019 12:47:00 PM This report has been signed electronically. Number of Addenda: 0

## 2019-12-23 NOTE — Anesthesia Postprocedure Evaluation (Signed)
Anesthesia Post Note  Patient: [place] Ndiaye  Procedure(s) Performed: ESOPHAGOGASTRODUODENOSCOPY (EGD) WITH PROPOFOL (Left )     Patient location during evaluation: Endoscopy Anesthesia Type: MAC Level of consciousness: awake and alert Pain management: pain level controlled Vital Signs Assessment: post-procedure vital signs reviewed and stable Respiratory status: spontaneous breathing, nonlabored ventilation and respiratory function stable Cardiovascular status: stable and blood pressure returned to baseline Postop Assessment: no apparent nausea or vomiting Anesthetic complications: no    Last Vitals:  Vitals:   12/23/19 1315 12/23/19 1330  BP: 137/67 (!) 158/70  Pulse: 87 87  Resp: 20 (!) 21  Temp:    SpO2: 100% 99%    Last Pain:  Vitals:   12/23/19 1330  TempSrc:   PainSc: 0-No pain                 Legrand Lasser,W. EDMOND

## 2019-12-23 NOTE — Progress Notes (Signed)
Occupational Therapy Evaluation Patient Details Name: Victor Schultz MRN: [id number] DOB: [birth year] Today's Date: 12/23/2019    History of Present Illness 69 y.o. male with medical history significant of DM; HLD: renal transplant with recurrent advanced CKD; HTN; and CAD presenting with coffee ground emesis. able to make it to the bathroom but had to crawl back to bed. Admitted 12/22/19 for Acute upper GI bleeding. and AKI on stage 4CKD   Clinical Impression   PTA, pt lives alone and was Independent in ADLs, IADLs, and mobility without AD. Pt Modified Independent for bed mobility to sit EOB with HOB lowered. Pt used bed rails to advance trunk, though advised not to use bed rails for best home environment simulation. Pt Supervision for initial sit to stand without AD and toilet transfer. Pt Supervision for hand hygiene at sink and donning sock seated in recliner chair. Pt min guard with progression of mobility hallway distances without AD with increasing unsteadiness and fatigue. Recommend 3 in 1 commode to use in walk-in shower due to pt reporting hx of fatigue after showering tasks. Pt also reports hx of walking to mailbox and back to house (about 244ft), requiring rest breaks. Due to these deficits and living home alone, recommend HHOT to follow up at DC to ensure safety within the home. Will continue to follow acutely.     Follow Up Recommendations  Home health OT;Supervision - Intermittent    Equipment Recommendations  3 in 1 bedside commode    Recommendations for Other Services       Precautions / Restrictions Precautions Precautions: Fall Restrictions Weight Bearing Restrictions: No      Mobility Bed Mobility Overal bed mobility: Modified Independent             General bed mobility comments: With flat bed, pt Modified Independent using bed rails   Transfers Overall transfer level: Needs assistance Equipment used: None Transfers: Sit to/from [employer]  Transfers Sit to Stand: Supervision Stand pivot transfers: Supervision;Min guard       General transfer comment: Supervision for toilet transfer, min guard for pivot to recliner without AD    Balance Overall balance assessment: Needs assistance Sitting-balance support: No upper extremity supported Sitting balance-Leahy Scale: Good     Standing balance support: No upper extremity supported;During functional activity Standing balance-Leahy Scale: Fair                             ADL either performed or assessed with clinical judgement   ADL Overall ADL's : Needs assistance/impaired Eating/Feeding: Independent;Sitting   Grooming: Supervision/safety;Standing;Wash/dry hands Grooming Details (indicate cue type and reason): Supervision for hand hygiene at sink  Upper Body Bathing: Supervision/ safety;Sitting;Standing   Lower Body Bathing: Min guard;Sit to/from stand   Upper Body Dressing : Set up;Sitting   Lower Body Dressing: Min guard;Sit to/from stand;Supervision/safety;Sitting/lateral leans Lower Body Dressing Details (indicate cue type and reason): Supervision to don socks sitting in recliner with increased time Toilet Transfer: Supervision/safety;Min guard;Ambulation;Regular Toilet;Cueing for safety Toilet Transfer Details (indicate cue type and reason): without RW Toileting- Clothing Manipulation and Hygiene: Supervision/safety;Sitting/lateral lean;Sit to/from stand       Functional mobility during ADLs: Min guard General ADL Comments: min guard due to progression of fatigue and unsteadiness      Vision         Perception     Praxis      Pertinent Vitals/Pain Pain Assessment: No/denies pain     Hand  Dominance Left   Extremity/Trunk Assessment Upper Extremity Assessment Upper Extremity Assessment: Overall WFL for tasks assessed   Lower Extremity Assessment Lower Extremity Assessment: Defer to PT evaluation   Cervical / Trunk  Assessment Cervical / Trunk Assessment: Normal   Communication Communication Communication: No difficulties   Cognition Arousal/Alertness: Awake/alert Behavior During Therapy: WFL for tasks assessed/performed Overall Cognitive Status: Within Functional Limits for tasks assessed                                     General Comments  BP WFL at 139/72    Exercises     Shoulder Instructions      Home Living Family/patient expects to be discharged to:: Private residence Living Arrangements: Alone Available Help at Discharge: Family(sister ) Type of Home: House Home Access: Stairs to enter [employer] of Steps: 3 Entrance Stairs-Rails: Can reach both Home Layout: One level     Bathroom Shower/Tub: Occupational psychologist: Standard     Home Equipment: Shower seat - built in(pt reports built in shower seat too far to reach water)          Prior Functioning/Environment Level of Independence: Independent                 OT Problem List: Impaired balance (sitting and/or standing);Decreased activity tolerance;Decreased safety awareness;Decreased knowledge of use of DME or AE      OT Treatment/Interventions: Self-care/ADL training;Therapeutic exercise;Energy conservation;DME and/or AE instruction;Therapeutic activities;Patient/family education    OT Goals(Current goals can be found in the care plan section) Acute Rehab OT Goals Patient Stated Goal: go home OT Goal Formulation: With patient Time For Goal Achievement: 01/06/20 Potential to Achieve Goals: Good  OT Frequency: Min 3X/week   Barriers to D/C:            Co-evaluation PT/OT/SLP Co-Evaluation/Treatment: Yes Reason for Co-Treatment: For patient/therapist safety;To address functional/ADL transfers          AM-PAC OT "6 Clicks" Daily Activity     Outcome Measure Help from another person eating meals?: None Help from another person taking care of personal grooming?:  A Little Help from another person toileting, which includes using toliet, bedpan, or urinal?: A Little Help from another person bathing (including washing, rinsing, drying)?: A Little Help from another person to put on and taking off regular upper body clothing?: A Little Help from another person to put on and taking off regular lower body clothing?: A Little 6 Click Score: 19   End of Session Equipment Utilized During Treatment: Gait belt Nurse Communication: Mobility status  Activity Tolerance: Patient tolerated treatment well Patient left: in chair;with call bell/phone within reach  OT Visit Diagnosis: Unsteadiness on feet (R26.81);Other abnormalities of gait and mobility (R26.89)                Time: 9924-2683 OT Time Calculation (min): 26 min Charges:  OT General Charges $OT Visit: 1 Visit OT Evaluation $OT Eval Moderate Complexity: 1 Mod  Layla Maw, OTR/L  Layla Maw 12/23/2019, 4:32 PM

## 2019-12-24 LAB — CBC WITH DIFFERENTIAL/PLATELET
Abs Immature Granulocytes: 0.06 10*3/uL (ref 0.00–0.07)
Basophils Absolute: 0 10*3/uL (ref 0.0–0.1)
Basophils Relative: 0 %
Eosinophils Absolute: 0.1 10*3/uL (ref 0.0–0.5)
Eosinophils Relative: 2 %
HCT: 21.4 % — ABNORMAL LOW (ref 39.0–52.0)
Hemoglobin: 7.1 g/dL — ABNORMAL LOW (ref 13.0–17.0)
Immature Granulocytes: 1 %
Lymphocytes Relative: 29 %
Lymphs Abs: 1.5 10*3/uL (ref 0.7–4.0)
MCH: 33.8 pg (ref 26.0–34.0)
MCHC: 33.2 g/dL (ref 30.0–36.0)
MCV: 101.9 fL — ABNORMAL HIGH (ref 80.0–100.0)
Monocytes Absolute: 0.5 10*3/uL (ref 0.1–1.0)
Monocytes Relative: 10 %
Neutro Abs: 3 10*3/uL (ref 1.7–7.7)
Neutrophils Relative %: 58 %
Platelets: 163 10*3/uL (ref 150–400)
RBC: 2.1 MIL/uL — ABNORMAL LOW (ref 4.22–5.81)
RDW: 20.3 % — ABNORMAL HIGH (ref 11.5–15.5)
WBC: 5.3 10*3/uL (ref 4.0–10.5)
nRBC: 0.4 % — ABNORMAL HIGH (ref 0.0–0.2)

## 2019-12-24 LAB — GLUCOSE, CAPILLARY
Glucose-Capillary: 75 mg/dL (ref 70–99)
Glucose-Capillary: 89 mg/dL (ref 70–99)
Glucose-Capillary: 73 mg/dL (ref 70–99)
Glucose-Capillary: 171 mg/dL — ABNORMAL HIGH (ref 70–99)
Glucose-Capillary: 144 mg/dL — ABNORMAL HIGH (ref 70–99)
Glucose-Capillary: 246 mg/dL — ABNORMAL HIGH (ref 70–99)

## 2019-12-24 LAB — COMPREHENSIVE METABOLIC PANEL
ALT: 31 U/L (ref 0–44)
AST: 45 U/L — ABNORMAL HIGH (ref 15–41)
Albumin: 1.8 g/dL — ABNORMAL LOW (ref 3.5–5.0)
Alkaline Phosphatase: 43 U/L (ref 38–126)
Anion gap: 10 (ref 5–15)
BUN: 69 mg/dL — ABNORMAL HIGH (ref 8–23)
CO2: 20 mmol/L — ABNORMAL LOW (ref 22–32)
Calcium: 7.9 mg/dL — ABNORMAL LOW (ref 8.9–10.3)
Chloride: 112 mmol/L — ABNORMAL HIGH (ref 98–111)
Creatinine, Ser: 3.62 mg/dL — ABNORMAL HIGH (ref 0.61–1.24)
GFR calc Af Amer: 19 mL/min — ABNORMAL LOW (ref >60)
GFR calc non Af Amer: 16 mL/min — ABNORMAL LOW (ref >60)
Glucose, Bld: 78 mg/dL (ref 70–99)
Potassium: 4.2 mmol/L (ref 3.5–5.1)
Sodium: 142 mmol/L (ref 135–145)
Total Bilirubin: 0.5 mg/dL (ref 0.3–1.2)
Total Protein: 4.6 g/dL — ABNORMAL LOW (ref 6.5–8.1)

## 2019-12-24 LAB — HEMOGLOBIN AND HEMATOCRIT, BLOOD
HCT: 30 % — ABNORMAL LOW (ref 39.0–52.0)
Hemoglobin: 9.8 g/dL — ABNORMAL LOW (ref 13.0–17.0)

## 2019-12-24 LAB — PREPARE RBC (CROSSMATCH)

## 2019-12-24 LAB — CBC
HCT: 21.7 % — ABNORMAL LOW (ref 39.0–52.0)
Hemoglobin: 7.1 g/dL — ABNORMAL LOW (ref 13.0–17.0)
MCH: 32.9 pg (ref 26.0–34.0)
MCHC: 32.7 g/dL (ref 30.0–36.0)
MCV: 100.5 fL — ABNORMAL HIGH (ref 80.0–100.0)
Platelets: 166 10*3/uL (ref 150–400)
RBC: 2.16 MIL/uL — ABNORMAL LOW (ref 4.22–5.81)
RDW: 19.8 % — ABNORMAL HIGH (ref 11.5–15.5)
WBC: 4.7 10*3/uL (ref 4.0–10.5)
nRBC: 0.4 % — ABNORMAL HIGH (ref 0.0–0.2)

## 2019-12-24 LAB — MAGNESIUM: Magnesium: 1.7 mg/dL (ref 1.7–2.4)

## 2019-12-24 LAB — BRAIN NATRIURETIC PEPTIDE: B Natriuretic Peptide: 118.1 pg/mL — ABNORMAL HIGH (ref 0.0–100.0)

## 2019-12-24 MED ORDER — FUROSEMIDE 10 MG/ML IJ SOLN
20.0000 mg | Freq: Once | INTRAMUSCULAR | Status: AC
  Administered 2019-12-24: 20 mg via INTRAVENOUS
  Filled 2019-12-24: qty 2

## 2019-12-24 MED ORDER — SODIUM CHLORIDE 0.9% IV SOLUTION: Freq: Once | INTRAVENOUS | Status: AC

## 2019-12-24 MED ORDER — INSULIN GLARGINE 100 UNIT/ML ~~LOC~~ SOLN
14.0000 [IU] | Freq: Every day | SUBCUTANEOUS | Status: DC
  Administered 2019-12-24 – 2019-12-26 (×3): 14 [IU] via SUBCUTANEOUS
  Filled 2019-12-24 (×4): qty 0.14

## 2019-12-24 MED ORDER — HYDRALAZINE HCL 20 MG/ML IJ SOLN
5.0000 mg | Freq: Once | INTRAMUSCULAR | Status: AC
  Administered 2019-12-24: 5 mg via INTRAVENOUS
  Filled 2019-12-24: qty 1

## 2019-12-24 NOTE — TOC Initial Note (Signed)
Transition of Care [facility]) - Initial/Assessment Note    Patient Details  Name: Victor Schultz MRN: [id number] Date of Birth: 23-Nov-1950  [facility] CM/SW Contact:    Maryclare Labrador, RN Phone Number: 12/24/2019, 4:25 PM  Clinical Narrative:   CM spoke with pt via phone.  Pt informed CM that he is independent from home alone however his sister will come and stay with him at discharge.  Pt declined HH and DME as recommended.  Pt informed CM that he has a PCP and denied barriers with paying for medications                Expected Discharge Plan: [place] Barriers to Discharge: Continued Medical Work up   Patient Goals and CMS Choice        Expected Discharge Plan and Services Expected Discharge Plan: [place]       Living arrangements for the past 2 months: Single Family Home                           HH Arranged: Refused HH          Prior Living Arrangements/Services Living arrangements for the past 2 months: Single Family Home Lives with:: Self   Do you feel safe going back to the place where you live?: Yes      Need for Family Participation in Patient Care: Yes (Comment) Care giver support system in place?: Yes (comment)   Criminal Activity/Legal Involvement Pertinent to Current Situation/Hospitalization: No - Comment as needed  Activities of Daily Living Home Assistive Devices/Equipment: None ADL Screening (condition at time of admission) Patient's cognitive ability adequate to safely complete daily activities?: Yes Is the patient deaf or have difficulty hearing?: No Does the patient have difficulty seeing, even when wearing glasses/contacts?: No Does the patient have difficulty concentrating, remembering, or making decisions?: No Patient able to express need for assistance with ADLs?: Yes Does the patient have difficulty dressing or bathing?: No Independently performs ADLs?: Yes (appropriate for  developmental age) Does the patient have difficulty walking or climbing stairs?: No Weakness of Legs: Both Weakness of Arms/Hands: None  Permission Sought/Granted                  Emotional Assessment   Attitude/Demeanor/Rapport: Engaged, Apprehensive   Orientation: : Oriented to Self, Oriented to Place, Oriented to  Time, Oriented to Situation   Psych Involvement: No (comment)  Admission diagnosis:  Acute upper GI bleeding [K92.2] Gastrointestinal hemorrhage, unspecified gastrointestinal hemorrhage type [K92.2] Patient Active Problem List   Diagnosis Date Noted  . Acute upper GI bleeding 12/22/2019  . Acute kidney injury superimposed on CKD ([place]) 12/22/2019  . Essential hypertension 12/22/2019  . Abnormal hemoglobin (Hgb) ([place]) 12/08/2019  . Serum potassium elevated 12/08/2019  . Type 2 diabetes mellitus with chronic kidney disease, without long-term current use of insulin ([place]) 12/06/2019  . Mixed hyperlipidemia 12/06/2019  . Gastroesophageal reflux disease without esophagitis 12/06/2019  . Kidney transplant status, cadaveric 07/08/2017  . CAD in native artery 02/08/2016  . Current every day smoker 02/08/2016  . S/P CABG x 3 02/08/2016  . Vitamin D deficiency 02/08/2016  . Hypoplastic kidney 01/31/2016  . Squamous cell carcinoma of skin of ear and external auditory canal 11/16/2010  . Personal history of malignant neoplasm of skin 10/09/2010   PCP:  Ivy Lynn, NP Pharmacy:  No Pharmacies Listed  Social Determinants of Health (SDOH) Interventions    Readmission Risk Interventions No flowsheet data found.

## 2019-12-24 NOTE — Consult Note (Signed)
Victor Schultz  HISTORY AND PHYSICAL  Victor Schultz is an 69 y.o. male.    Chief Complaint: coffee ground emesis and melena   HPI: pt is a 42M with a PMH for HTN, HLD, DM II and ESRD s/p kidney transplantation (once in 1974, next in 1982) with functioning graft, baseline Cr 2.5 (12/2019) followed by Victor Schultz in [facility] who is now seen in consultation at the request of Dr. Candiss Norse for eval and recs re: AKI on CKD.  Pt is maintained on prednisone and Imuran.  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    Presented to the hospital 3/17 with coffee ground emesis. hgb 10.0 on admission and then nadired at 7.1.  He underwent an EGD showing erosive gastritis and gastric ulcers along with Barrett's esophagus.  Placed on PPI BID.  Noted also some urinary retention- Foley placed, Flomax started.  He had a transfusion today and Hgb is now 9.8.  Cr was 3.5 on admission 3/17 and has stayed around there.  In this setting we are asked to see.   Currently pt has no complaints.  Was feeling very dizzy and lightheaded when he stood up today prior to the blood transfusion; now he is feeling much stronger.  No f/c, n/v, SOB, CP, LE edema.  He is adherent to his transplant meds.   PMH: Past Medical History:  Diagnosis Date  . Atherosclerotic heart disease of native coronary artery with unstable angina pectoris ([place])   . Atrophy of thyroid (acquired)   . History of end stage renal disease   . Hypertensive heart and chronic kidney disease without heart failure, with stage 5 chronic kidney disease, or end stage renal disease ([place])   . Kidney transplant status   . Long term (current) use of insulin ([place])   . Mixed hyperlipidemia   . Type 2 diabetes mellitus with diabetic chronic kidney disease (HCC)    PSH: Past Surgical History:  Procedure Laterality Date  . CATARACT EXTRACTION    . CORONARY ARTERY BYPASS GRAFT    . KIDNEY TRANSPLANT     1974, 1982    Past Medical History:  Diagnosis Date   . Atherosclerotic heart disease of native coronary artery with unstable angina pectoris ([place])   . Atrophy of thyroid (acquired)   . History of end stage renal disease   . Hypertensive heart and chronic kidney disease without heart failure, with stage 5 chronic kidney disease, or end stage renal disease ([place])   . Kidney transplant status   . Long term (current) use of insulin ([place])   . Mixed hyperlipidemia   . Type 2 diabetes mellitus with diabetic chronic kidney disease (HCC)     Medications:   Scheduled: . atorvastatin  10 mg Oral Daily  . azaTHIOprine  125 mg Oral Daily  . Chlorhexidine Gluconate Cloth  6 each Topical Daily  . ferrous sulfate  325 mg Oral TID WC  . insulin aspart  0-9 Units Subcutaneous Q6H  . insulin glargine  14 Units Subcutaneous QHS  . levothyroxine  50 mcg Oral Q0600  . metoprolol tartrate  12.5 mg Oral BID  . nicotine  14 mg Transdermal Daily  . pantoprazole (PROTONIX) IV  40 mg Intravenous Q12H  . predniSONE  5 mg Oral QODAY  . sodium bicarbonate  650 mg Oral BID  . sodium chloride flush  3 mL Intravenous Q12H  . tamsulosin  0.4 mg Oral Daily    Medications Prior to Admission  Medication Sig Dispense  Refill  . aspirin 81 MG EC tablet Take 81 mg by mouth daily.    [name] atorvastatin (LIPITOR) 20 MG tablet Take 1 tablet (20 mg total) by mouth daily. (Patient taking differently: Take 10 mg by mouth daily. ) 90 tablet 3  . azaTHIOprine (IMURAN) 50 MG tablet Take 125 mg by mouth daily. Taking 2.5 tablets    . LANTUS SOLOSTAR 100 UNIT/ML Solostar Pen Inject 40 Units into the skin at bedtime.     [name] levothyroxine (SYNTHROID) 50 MCG tablet Take 50 mcg by mouth daily.    . metoprolol tartrate (LOPRESSOR) 25 MG tablet Take 12.5 mg by mouth 2 (two) times daily.    . predniSONE (DELTASONE) 5 MG tablet Take 5 mg by mouth every other day.    . sodium bicarbonate 650 MG tablet Take 650 mg by mouth 2 (two) times daily.    . Vitamin D, Ergocalciferol, (DRISDOL) 1.25 MG  (50000 UNIT) CAPS capsule Take 50,000 Units by mouth once a week.    . B-D UF III MINI PEN NEEDLES 31G X 5 MM MISC USE WITH LANTUS AS DIRECTED      ALLERGIES:  No Known Allergies  FAM HX: Family History  Problem Relation Age of Onset  . Liver disease Mother   . Alzheimer's disease Father     Social History:   reports that he has been smoking cigarettes. He has a 29.25 pack-year smoking history. He has never used smokeless tobacco. He reports that he does not drink alcohol or use drugs.  ROS: ROS: all other systems reviewed and are negative except as per HPI  Blood pressure (!) 156/70, pulse 85, temperature (!) 97.5 F (36.4 C), temperature source Oral, resp. rate 18, height 5\' 4"  (1.626 m), weight 75.3 kg, SpO2 100 %. PHYSICAL EXAM: Physical Exam  GEN NAD, lying flat in bed HEENT EOMI PERRL NECK no JVD PULM clear bilaterally CV RRR ABD obese, allograft without tenderness EXT no LE edema NEURO AAO x 3 SKIN multiple AK MSK no effusions    Results for orders placed or performed during the hospital encounter of 12/22/19 (from the past 48 hour(s))  Hemoglobin and hematocrit, blood     Status: Abnormal   Collection Time: 12/22/19  7:24 PM  Result Value Ref Range   Hemoglobin 9.9 (L) 13.0 - 17.0 g/dL    Comment: REPEATED TO VERIFY POST TRANSFUSION SPECIMEN    HCT 30.7 (L) 39.0 - 52.0 %    Comment: Performed at [facility], [place] [address] [place], [place] [address]  CBC     Status: Abnormal   Collection Time: 12/22/19 10:00 PM  Result Value Ref Range   WBC 12.6 (H) 4.0 - 10.5 K/uL   RBC 3.05 (L) 4.22 - 5.81 MIL/uL   Hemoglobin 10.0 (L) 13.0 - 17.0 g/dL   HCT 30.4 (L) 39.0 - 52.0 %   MCV 99.7 80.0 - 100.0 fL    Comment: POST TRANSFUSION SPECIMEN REPEATED TO VERIFY DELTA CHECK NOTED    MCH 32.8 26.0 - 34.0 pg   MCHC 32.9 30.0 - 36.0 g/dL   RDW 20.5 (H) 11.5 - 15.5 %   Platelets 238 150 - 400 K/uL   nRBC 0.0 0.0 - 0.2 %    Comment: Performed at [facility], [place] [address] [place],  [address]  Basic metabolic panel     Status: Abnormal   Collection Time: 12/22/19 10:00 PM  Result Value Ref Range   Sodium  142 135 - 145 mmol/L   Potassium 4.9 3.5 - 5.1 mmol/L   Chloride 110 98 - 111 mmol/L   CO2 17 (L) 22 - 32 mmol/L   Glucose, Bld 213 (H) 70 - 99 mg/dL    Comment: Glucose reference range applies only to samples taken after fasting for at least 8 hours.   BUN 82 (H) 8 - 23 mg/dL   Creatinine, Ser 3.73 (H) 0.61 - 1.24 mg/dL   Calcium 7.9 (L) 8.9 - 10.3 mg/dL   GFR calc non Af Amer 16 (L) >60 mL/min   GFR calc Af Amer 18 (L) >60 mL/min   Anion gap 15 5 - 15    Comment: Performed at [facility] [address], [place], [place] [address]  Glucose, capillary     Status: Abnormal   Collection Time: 12/22/19 10:17 PM  Result Value Ref Range   Glucose-Capillary 167 (H) 70 - 99 mg/dL    Comment: Glucose reference range applies only to samples taken after fasting for at least 8 hours.  Glucose, capillary     Status: None   Collection Time: 12/23/19  4:30 AM  Result Value Ref Range   Glucose-Capillary 91 70 - 99 mg/dL    Comment: Glucose reference range applies only to samples taken after fasting for at least 8 hours.  Basic metabolic panel     Status: Abnormal   Collection Time: 12/23/19  7:12 AM  Result Value Ref Range   Sodium 144 135 - 145 mmol/L   Potassium 4.8 3.5 - 5.1 mmol/L   Chloride 111 98 - 111 mmol/L   CO2 20 (L) 22 - 32 mmol/L   Glucose, Bld 77 70 - 99 mg/dL    Comment: Glucose reference range applies only to samples taken after fasting for at least 8 hours.   BUN 80 (H) 8 - 23 mg/dL   Creatinine, Ser 3.68 (H) 0.61 - 1.24 mg/dL   Calcium 8.0 (L) 8.9 - 10.3 mg/dL   GFR calc non Af Amer 16 (L) >60 mL/min   GFR calc Af Amer 18 (L) >60 mL/min   Anion gap 13 5 - 15    Comment: Performed at [facility] [address], [place], [place] [address]  CBC     Status: Abnormal   Collection Time: 12/23/19   7:12 AM  Result Value Ref Range   WBC 9.1 4.0 - 10.5 K/uL   RBC 2.79 (L) 4.22 - 5.81 MIL/uL   Hemoglobin 9.2 (L) 13.0 - 17.0 g/dL   HCT 27.7 (L) 39.0 - 52.0 %   MCV 99.3 80.0 - 100.0 fL   MCH 33.0 26.0 - 34.0 pg   MCHC 33.2 30.0 - 36.0 g/dL   RDW 21.4 (H) 11.5 - 15.5 %   Platelets 212 150 - 400 K/uL   nRBC 0.2 0.0 - 0.2 %    Comment: Performed at [facility], [place] [address], [place], [place] [address]  HIV Antibody (routine testing w rflx)     Status: None   Collection Time: 12/23/19  7:12 AM  Result Value Ref Range   HIV Screen 4th Generation wRfx NON REACTIVE NON REACTIVE    Comment: Performed at [facility] [address] [place], [place] [address]  Brain natriuretic peptide     Status: None   Collection Time: 12/23/19  7:12 AM  Result Value Ref Range   B Natriuretic Peptide 65.8 0.0 - 100.0 pg/mL  Comment: Performed at [facility], [place] [address], [place], [place] [address]  Magnesium     Status: None   Collection Time: 12/23/19  7:12 AM  Result Value Ref Range   Magnesium 1.7 1.7 - 2.4 mg/dL    Comment: Performed at [facility] [address], [place], [place] [address]  Protime-INR     Status: None   Collection Time: 12/23/19  8:16 AM  Result Value Ref Range   Prothrombin Time 14.8 11.4 - 15.2 seconds   INR 1.2 0.8 - 1.2    Comment: (NOTE) INR goal varies based on device and disease states. Performed at [facility], [place] [address], [place], [place] [address]   Glucose, capillary     Status: Abnormal   Collection Time: 12/23/19  8:31 AM  Result Value Ref Range   Glucose-Capillary 63 (L) 70 - 99 mg/dL    Comment: Glucose reference range applies only to samples taken after fasting for at least 8 hours.  Glucose, capillary     Status: None   Collection Time: 12/23/19 11:37 AM  Result Value Ref Range   Glucose-Capillary 92 70 - 99 mg/dL    Comment: Glucose reference range applies only to samples taken after fasting for at least 8  hours.  Glucose, capillary     Status: Abnormal   Collection Time: 12/23/19  1:16 PM  Result Value Ref Range   Glucose-Capillary 107 (H) 70 - 99 mg/dL    Comment: Glucose reference range applies only to samples taken after fasting for at least 8 hours.  Glucose, capillary     Status: Abnormal   Collection Time: 12/23/19  2:29 PM  Result Value Ref Range   Glucose-Capillary 122 (H) 70 - 99 mg/dL    Comment: Glucose reference range applies only to samples taken after fasting for at least 8 hours.  CBC     Status: Abnormal   Collection Time: 12/23/19  2:31 PM  Result Value Ref Range   WBC 7.6 4.0 - 10.5 K/uL   RBC 2.77 (L) 4.22 - 5.81 MIL/uL   Hemoglobin 9.1 (L) 13.0 - 17.0 g/dL   HCT 27.6 (L) 39.0 - 52.0 %   MCV 99.6 80.0 - 100.0 fL   MCH 32.9 26.0 - 34.0 pg   MCHC 33.0 30.0 - 36.0 g/dL   RDW 21.6 (H) 11.5 - 15.5 %   Platelets 193 150 - 400 K/uL   nRBC 0.3 (H) 0.0 - 0.2 %    Comment: Performed at [facility] [address], [place], [place] [address]  Glucose, capillary     Status: Abnormal   Collection Time: 12/23/19  4:29 PM  Result Value Ref Range   Glucose-Capillary 273 (H) 70 - 99 mg/dL    Comment: Glucose reference range applies only to samples taken after fasting for at least 8 hours.  Urinalysis, Routine w reflex microscopic     Status: Abnormal   Collection Time: 12/23/19  5:40 PM  Result Value Ref Range   Color, Urine YELLOW YELLOW   APPearance CLEAR CLEAR   Specific Gravity, Urine 1.012 1.005 - 1.030   pH 6.0 5.0 - 8.0   Glucose, UA 150 (A) NEGATIVE mg/dL   Hgb urine dipstick SMALL (A) NEGATIVE   Bilirubin Urine NEGATIVE NEGATIVE   Ketones, ur NEGATIVE NEGATIVE mg/dL   Protein, ur >=300 (A) NEGATIVE mg/dL   Nitrite NEGATIVE NEGATIVE   Leukocytes,Ua NEGATIVE NEGATIVE   WBC, UA  0-5 0 - 5 WBC/hpf   Bacteria, UA RARE (A) NONE SEEN   Squamous Epithelial / LPF 0-5 0 - 5    Comment: Performed at [facility], [place] [address], [place], [place] [address]   Glucose, capillary     Status: None   Collection Time: 12/23/19  9:58 PM  Result Value Ref Range   Glucose-Capillary 99 70 - 99 mg/dL    Comment: Glucose reference range applies only to samples taken after fasting for at least 8 hours.  Glucose, capillary     Status: None   Collection Time: 12/24/19 12:15 AM  Result Value Ref Range   Glucose-Capillary 75 70 - 99 mg/dL    Comment: Glucose reference range applies only to samples taken after fasting for at least 8 hours.  Glucose, capillary     Status: None   Collection Time: 12/24/19  2:21 AM  Result Value Ref Range   Glucose-Capillary 89 70 - 99 mg/dL    Comment: Glucose reference range applies only to samples taken after fasting for at least 8 hours.  CBC with Differential/Platelet     Status: Abnormal   Collection Time: 12/24/19  4:30 AM  Result Value Ref Range   WBC 5.3 4.0 - 10.5 K/uL   RBC 2.10 (L) 4.22 - 5.81 MIL/uL   Hemoglobin 7.1 (L) 13.0 - 17.0 g/dL    Comment: REPEATED TO VERIFY   HCT 21.4 (L) 39.0 - 52.0 %   MCV 101.9 (H) 80.0 - 100.0 fL   MCH 33.8 26.0 - 34.0 pg   MCHC 33.2 30.0 - 36.0 g/dL   RDW 20.3 (H) 11.5 - 15.5 %   Platelets 163 150 - 400 K/uL   nRBC 0.4 (H) 0.0 - 0.2 %   Neutrophils Relative % 58 %   Neutro Abs 3.0 1.7 - 7.7 K/uL   Lymphocytes Relative 29 %   Lymphs Abs 1.5 0.7 - 4.0 K/uL   Monocytes Relative 10 %   Monocytes Absolute 0.5 0.1 - 1.0 K/uL   Eosinophils Relative 2 %   Eosinophils Absolute 0.1 0.0 - 0.5 K/uL   Basophils Relative 0 %   Basophils Absolute 0.0 0.0 - 0.1 K/uL   Immature Granulocytes 1 %   Abs Immature Granulocytes 0.06 0.00 - 0.07 K/uL    Comment: Performed at [facility], [address], [place], [place] [address]  Brain natriuretic peptide     Status: Abnormal   Collection Time: 12/24/19  4:30 AM  Result Value Ref Range   B Natriuretic Peptide 118.1 (H) 0.0 - 100.0 pg/mL    Comment: Performed at [facility] [address], [place],  [address]   Comprehensive metabolic panel     Status: Abnormal   Collection Time: 12/24/19  4:30 AM  Result Value Ref Range   Sodium 142 135 - 145 mmol/L   Potassium 4.2 3.5 - 5.1 mmol/L   Chloride 112 (H) 98 - 111 mmol/L   CO2 20 (L) 22 - 32 mmol/L   Glucose, Bld 78 70 - 99 mg/dL    Comment: Glucose reference range applies only to samples taken after fasting for at least 8 hours.   BUN 69 (H) 8 - 23 mg/dL   Creatinine, Ser 3.62 (H) 0.61 - 1.24 mg/dL   Calcium 7.9 (L) 8.9 - 10.3 mg/dL   Total Protein 4.6 (L) 6.5 - 8.1 g/dL   Albumin 1.8 (L) 3.5 - 5.0 g/dL   AST 45 (  H) 15 - 41 U/L   ALT 31 0 - 44 U/L   Alkaline Phosphatase 43 38 - 126 U/L   Total Bilirubin 0.5 0.3 - 1.2 mg/dL   GFR calc non Af Amer 16 (L) >60 mL/min   GFR calc Af Amer 19 (L) >60 mL/min   Anion gap 10 5 - 15    Comment: Performed at [facility] [address], [place], [place] [address]  Magnesium     Status: None   Collection Time: 12/24/19  4:30 AM  Result Value Ref Range   Magnesium 1.7 1.7 - 2.4 mg/dL    Comment: Performed at [facility] [address] [place], [place] [address]  Glucose, capillary     Status: None   Collection Time: 12/24/19  5:17 AM  Result Value Ref Range   Glucose-Capillary 73 70 - 99 mg/dL    Comment: Glucose reference range applies only to samples taken after fasting for at least 8 hours.  CBC     Status: Abnormal   Collection Time: 12/24/19  8:13 AM  Result Value Ref Range   WBC 4.7 4.0 - 10.5 K/uL   RBC 2.16 (L) 4.22 - 5.81 MIL/uL   Hemoglobin 7.1 (L) 13.0 - 17.0 g/dL   HCT 21.7 (L) 39.0 - 52.0 %   MCV 100.5 (H) 80.0 - 100.0 fL   MCH 32.9 26.0 - 34.0 pg   MCHC 32.7 30.0 - 36.0 g/dL   RDW 19.8 (H) 11.5 - 15.5 %   Platelets 166 150 - 400 K/uL   nRBC 0.4 (H) 0.0 - 0.2 %    Comment: Performed at [facility] [address], [place], [place] [address]  Prepare RBC (crossmatch)     Status: None   Collection Time: 12/24/19 11:30 AM  Result Value Ref Range   Order  Confirmation      ORDER PROCESSED BY BLOOD BANK Performed at [facility], [place] [address], [place], [place] [address]   Glucose, capillary     Status: Abnormal   Collection Time: 12/24/19 12:32 PM  Result Value Ref Range   Glucose-Capillary 171 (H) 70 - 99 mg/dL    Comment: Glucose reference range applies only to samples taken after fasting for at least 8 hours.  Hemoglobin and hematocrit, blood     Status: Abnormal   Collection Time: 12/24/19  4:00 PM  Result Value Ref Range   Hemoglobin 9.8 (L) 13.0 - 17.0 g/dL    Comment: REPEATED TO VERIFY POST TRANSFUSION SPECIMEN    HCT 30.0 (L) 39.0 - 52.0 %    Comment: Performed at [facility] [address], [place],  [address]    US RENAL  Result Date: 12/23/2019 CLINICAL DATA:  Acute kidney injury history of bilateral renal transplants EXAM: RENAL / URINARY TRACT ULTRASOUND COMPLETE COMPARISON:  03/23/2018 FINDINGS: Right Kidney: Native right renal measurements: 8.9 x 6.1 x 5.3 cm. = volume: 145 mL Increased cortical echogenicity with decreased corticomedullary differentiation. Diffuse cortical thinning. No hydronephrosis. Cyst at the lower pole measuring 2 x 1.6 x 2.4 cm. Right lower quadrant renal transplant measurements: 12.4 x 6.4 x 6.8 cm = volume: 280.1 mL. Cortical echogenicity is normal. No hydronephrosis. No perinephric fluid collections. Left Kidney: Native left renal measurements: 9.2 by 4.9 x 5.1 cm = volume: 119 mL. Increased cortical echogenicity with decreased corticomedullary differentiation. Diffuse cortical thinning. Central anechoic area either representing parapelvic cyst or mild hydronephrosis. Targeted  ultrasound of the left lower quadrant was performed. The left lower quadrant transplant kidney could not be identified. Bladder: Appears normal for degree of bladder distention. Other: None. IMPRESSION: 1. Status post bilateral lower quadrant renal transplants. The right lower quadrant renal transplant is  within normal limits with no evidence for hydronephrosis or perinephric fluid collection. The left lower quadrant transplant could not be visualized. 2. Native kidneys are echogenic and atrophic. There is a cyst in the native right kidney. There is a parapelvic cysts or mild hydronephrosis of the left native kidney. Electronically Signed   By: Donavan Foil M.D.   On: 12/23/2019 18:19    Assessment/Plan  1. AKI on CKD IV T: transplant since 1982.  Continue current IS.  AKI likely multifactorial and is hemodynamically mediated with possible obstructive uropathy too.  Given vintage of renal transplant would expect ischemic insults to take longer to recover and I think blood transfusion will help. Don't suspect rejection.  UA bland except > 300 mg protein, will get UP/C  2.  GI bleed: EGD showing erosive esophagitis and gastric ulcers, Barrett's esophagus.  On PPI.  Will follow with GI as OP. S/p pRBCs today  3.  DM II: per primary  4.  HTN: low dose metoprolol  5.  Metabolic acidosis: sodium bicarb  6. Dispo: if Cr better in the AM could go with OP followup  Adore Kithcart, [place] 12/24/2019, 5:28 PM

## 2019-12-24 NOTE — Plan of Care (Signed)
  Problem: Education: Goal: Knowledge of General Education information will improve Description: Including pain rating scale, medication(s)/side effects and non-pharmacologic comfort measures Outcome: Progressing   Problem: Health Behavior/Discharge Planning: Goal: Ability to manage health-related needs will improve Outcome: Progressing   Problem: Clinical Measurements: Goal: Ability to maintain clinical measurements within normal limits will improve Outcome: Progressing Goal: Will remain free from infection Outcome: Progressing Goal: Diagnostic test results will improve Outcome: Progressing Goal: Respiratory complications will improve Outcome: Progressing Goal: Cardiovascular complication will be avoided Outcome: Progressing   Problem: Activity: Goal: Risk for activity intolerance will decrease Outcome: Progressing   Problem: Nutrition: Goal: Adequate nutrition will be maintained Outcome: Progressing   Problem: Elimination: Goal: Will not experience complications related to bowel motility Outcome: Progressing Goal: Will not experience complications related to urinary retention Outcome: Progressing   Problem: Pain Managment: Goal: General experience of comfort will improve Outcome: Progressing   Problem: Safety: Goal: Ability to remain free from injury will improve Outcome: Progressing   Problem: Education: Goal: Ability to identify signs and symptoms of gastrointestinal bleeding will improve Outcome: Progressing   Problem: Bowel/Gastric: Goal: Will show no signs and symptoms of gastrointestinal bleeding Outcome: Progressing   Problem: Fluid Volume: Goal: Will show no signs and symptoms of excessive bleeding Outcome: Progressing   Problem: Clinical Measurements: Goal: Complications related to the disease process, condition or treatment will be avoided or minimized Outcome: Progressing

## 2019-12-24 NOTE — Progress Notes (Signed)
Subjective: No hematemesis or melena. No abdominal pain.  Objective: Vital signs in last 24 hours: Temp:  [97.8 F (36.6 C)-98.3 F (36.8 C)] 97.8 F (36.6 C) (03/19 1127) Pulse Rate:  [81-105] 81 (03/19 1127) Resp:  [19-25] 21 (03/19 1127) BP: (115-164)/(56-74) 140/71 (03/19 1127) SpO2:  [97 %-100 %] 97 % (03/19 0513) Weight change: 0.297 kg Last BM Date: 12/23/19  PE: GEN:  NAD ABD:  Soft, protuberant, non-tender  Lab Results: CBC    Component Value Date/Time   WBC 4.7 12/24/2019 0813   RBC 2.16 (L) 12/24/2019 0813   HGB 7.1 (L) 12/24/2019 0813   HGB 11.1 (L) 12/06/2019 0917   HCT 21.7 (L) 12/24/2019 0813   HCT 33.1 (L) 12/06/2019 0917   PLT 166 12/24/2019 0813   PLT 272 12/06/2019 0917   MCV 100.5 (H) 12/24/2019 0813   MCV 104 (H) 12/06/2019 0917   MCH 32.9 12/24/2019 0813   MCHC 32.7 12/24/2019 0813   RDW 19.8 (H) 12/24/2019 0813   RDW 13.7 12/06/2019 0917   LYMPHSABS 1.5 12/24/2019 0430   LYMPHSABS 2.7 12/06/2019 0917   MONOABS 0.5 12/24/2019 0430   EOSABS 0.1 12/24/2019 0430   EOSABS 0.1 12/06/2019 0917   BASOSABS 0.0 12/24/2019 0430   BASOSABS 0.0 12/06/2019 0917   CMP     Component Value Date/Time   NA 142 12/24/2019 0430   NA 138 12/08/2019 1446   K 4.2 12/24/2019 0430   CL 112 (H) 12/24/2019 0430   CO2 20 (L) 12/24/2019 0430   GLUCOSE 78 12/24/2019 0430   BUN 69 (H) 12/24/2019 0430   BUN 36 (H) 12/08/2019 1446   CREATININE 3.62 (H) 12/24/2019 0430   CALCIUM 7.9 (L) 12/24/2019 0430   PROT 4.6 (L) 12/24/2019 0430   PROT 6.9 12/08/2019 1446   ALBUMIN 1.8 (L) 12/24/2019 0430   ALBUMIN 3.0 (L) 12/08/2019 1446   AST 45 (H) 12/24/2019 0430   ALT 31 12/24/2019 0430   ALKPHOS 43 12/24/2019 0430   BILITOT 0.5 12/24/2019 0430   BILITOT 0.5 12/08/2019 1446   GFRNONAA 16 (L) 12/24/2019 0430   GFRAA 19 (L) 12/24/2019 0430   Assessment:  1.  Melena and coffee ground emesis.  Likely from severe esophagitis and esophageal ulcers seen yesterday. 2.   Acute on chronic anemia, with some acute blood loss component. 3.  Multiple medical problems including diabetes and kidney transplantation.  Plan:  1.  Given patient's recent bleeding and severe esophagitis, I did not elect to biopsy the patient's esophageal ulcers (which, mindful of patient's immunosuppression, did not have typical appearance, and were not nearly as widespread as that is typically seen with either candida, CMV or HSV process. 2.  Would transition to oral PPI, pantoprazole 40 mg po bid, now and indefinitely upon discharge. 3.  Advance diet as tolerated. 4.  If no further bleeding, ok to discharge tomorrow from GI perspective. 5.  We will arrange outpatient follow-up; patient will also need repeat endoscopy in 3 months to reassess for esophageal healing. 6.  [facility] will sign-off; please call with questions; thank you for the consultation.   Landry Dyke 12/24/2019, 12:55 PM   Cell [phone] If no answer or after 5 PM call [phone]

## 2019-12-24 NOTE — Progress Notes (Signed)
Pt BP 170/75. Notified Jeannette Corpus, NP. Will continue to monitor the patient

## 2019-12-24 NOTE — Progress Notes (Signed)
   Vital Signs MEWS/VS Documentation      12/23/2019 1348 12/23/2019 2020 12/23/2019 2155 12/24/2019 0016   MEWS Score:  1  3  2   0   MEWS Score Color:  Green  Yellow  Yellow  Green   Resp:  --  --  (!) 23  20   Pulse:  95  --  (!) 105  --   BP:  (!) 164/74  --  139/68  138/70   Temp:  --  --  98.2 F (36.8 C)  98 F (36.7 C)   O2 Device:  --  --  Room Air  --   Level of Consciousness:  --  --  Alert  --           Harl Bowie 12/24/2019,3:22 AM

## 2019-12-24 NOTE — Progress Notes (Signed)
PROGRESS NOTE                                                                                                                                                                                                             Patient Demographics:    Victor Schultz, is a 69 y.o. male, DOB - 1951-05-09, YHC:[id number]  Admit date - 12/22/2019   Admitting Physician Karmen Bongo, MD  Outpatient Primary MD for the patient is Ivy Lynn, NP, [place] [facility]; Donnetta Hutching - cardiology  LOS - 2  Chief Complaint  Patient presents with  . Hematemesis       Brief Narrative    Victor Schultz is a 69 y.o. male with medical history significant of DM; HLD: renal transplant with recurrent advanced CKD; HTN; and CAD presenting with coffee ground emesis.  Was diagnosed with active upper GI bleed, ordered 1 unit of packed RBC on IV PPI, GI was consulted and he was admitted for further treatment.  Of note patient never has had EGD or colonoscopy before this admission.   Subjective:    [name] today has, No headache, No chest pain, No abdominal pain - No Nausea, No new weakness tingling or numbness, No Cough - SOB.     Assessment  & Plan :     1.  Acute blood loss anemia caused by acute upper GI bleed.  Received 1 unit of packed RBC on 12/23/2019, on IV PPI with clear liquids, underwent EGD on 12/23/2019 by [facility] showing esophagitis and a nonbleeding esophageal ulcer, unfortunately H&H has dropped again on 12/24/2019, no nausea or upper GI discomfort, continue IV PPI and clear liquids, transfuse another unit on 12/24/2019 and monitor.  2.  History of CKD 4 status post 2 kidney transplants.  Follow back [facility], baseline creatinine in chart review appears to be around 2.2, 1-year ago, he certainly could have progressed since then, for now could have had some AKI due to anemia, H&H currently stable, will transfuse and try to keep his hemoglobin around 8, avoid nephrotoxins, he did have  some urinary retention on 12/23/2019 with about 400 cc of residual urine in the bladder hence he was started on Flomax and Foley was placed. However his creatinine seems to have plateaued around 3.5, will request nephrology to evaluate considering his underlying 2 kidney transplants, renal ultrasound for the transplant kidneys does not show any edema or signs of rejection. Will defer further management to nephrology.  3.  Dyslipidemia.  On statin.  4.  Hypothyroidism .  Home dose Synthroid.  5.  Hypertension.  Continue home dose beta-blocker.  6.  CAD.  Chest pain-free on combination of aspirin beta-blocker and statin for secondary prevention, aspirin on hold for GI bleed.   7.  Smoking.  Counseled to quit.   8. DM type II.  On Lantus and sliding scale.  Dose adjusted (reduced).  Gentle D5 while he is n.p.o. to avoid hypoglycemia, will monitor closely.  Lab Results  Component Value Date   HGBA1C 6.5 (H) 12/06/2019   CBG (last 3)  Recent Labs    12/24/19 0015 12/24/19 0221 12/24/19 0517  GLUCAP 75 89 73     Family Communication  :  Judieth Keens - [phone] message left on cell phone 12/23/2019 at 11:52 AM, 12/24/19 @ 10.51  Code Status :  Full  Disposition Plan  : Stay in the hospital, getting treatment for upper GI bleed requiring blood transfusion, being monitored closely.  Still on IV PPI and H&H being monitored closely.  Consults  :  GI  Procedures  :    EGD -   DVT Prophylaxis  :   SCDs    Lab Results  Component Value Date   PLT 166 12/24/2019    Diet :  Diet Order            Diet clear liquid Room service appropriate? Yes; Fluid consistency: Thin  Diet effective now               Inpatient Medications Scheduled Meds: . sodium chloride   Intravenous Once  . atorvastatin  10 mg Oral Daily  . azaTHIOprine  125 mg Oral Daily  . Chlorhexidine Gluconate Cloth  6 each Topical Daily  . ferrous sulfate  325 mg Oral TID WC  . furosemide  20 mg Intravenous  Once  . insulin aspart  0-9 Units Subcutaneous Q6H  . insulin glargine  20 Units Subcutaneous QHS  . levothyroxine  50 mcg Oral Q0600  . metoprolol tartrate  12.5 mg Oral BID  . nicotine  14 mg Transdermal Daily  . pantoprazole (PROTONIX) IV  40 mg Intravenous Q12H  . predniSONE  5 mg Oral QODAY  . sodium bicarbonate  650 mg Oral BID  . sodium chloride flush  3 mL Intravenous Q12H  . tamsulosin  0.4 mg Oral Daily   Continuous Infusions:  PRN Meds:.acetaminophen **OR** [DISCONTINUED] acetaminophen, [DISCONTINUED] ondansetron **OR** ondansetron (ZOFRAN) IV  Antibiotics  :   Anti-infectives (From admission, onward)   Start     Dose/Rate Route Frequency Ordered Stop   12/22/19 1330  cefTRIAXone (ROCEPHIN) 1 g in sodium chloride 0.9 % 100 mL IVPB     1 g 200 mL/hr over 30 Minutes Intravenous  Once 12/22/19 1320 12/22/19 1439          Objective:   Vitals:   12/23/19 2155 12/24/19 0016 12/24/19 0350 12/24/19 0513  BP: 139/68 138/70 131/70 (!) 124/59  Pulse: (!) 105  (!) 103 97  Resp: (!) 23 20 19 19   Temp: 98.2 F (36.8 C) 98 F (36.7 C) 98.3 F (36.8 C) 98.2 F (36.8 C)  TempSrc: Oral Oral Oral Oral  SpO2:   100% 97%  Weight:      Height:        SpO2: 97 % O2 Flow Rate (L/min): 2 L/min  Wt Readings from Last 3 Encounters:  12/23/19 75.3 kg  12/06/19 75.3 kg     Intake/Output Summary (  Last 24 hours) at 12/24/2019 1044 Last data filed at 12/24/2019 0406 Gross per 24 hour  Intake 931.13 ml  Output 1000 ml  Net -68.87 ml     Physical Exam  Awake Alert, No new F.N deficits, Normal affect [place].AT,PERRAL Supple Neck,No JVD, No cervical lymphadenopathy appriciated.  Symmetrical Chest wall movement, Good air movement bilaterally, CTAB RRR,No Gallops, Rubs or new Murmurs, No Parasternal Heave +ve B.Sounds, Abd Soft, No tenderness, No organomegaly appriciated, No rebound - guarding or rigidity. No Cyanosis, Clubbing or edema, No new Rash or bruise     Data  Review:    Recent Labs  Lab 12/22/19 2200 12/23/19 0712 12/23/19 1431 12/24/19 0430 12/24/19 0813  WBC 12.6* 9.1 7.6 5.3 4.7  HGB 10.0* 9.2* 9.1* 7.1* 7.1*  HCT 30.4* 27.7* 27.6* 21.4* 21.7*  PLT 238 212 193 163 166  MCV 99.7 99.3 99.6 101.9* 100.5*  MCH 32.8 33.0 32.9 33.8 32.9  MCHC 32.9 33.2 33.0 33.2 32.7  RDW 20.5* 21.4* 21.6* 20.3* 19.8*  LYMPHSABS  --   --   --  1.5  --   MONOABS  --   --   --  0.5  --   EOSABS  --   --   --  0.1  --   BASOSABS  --   --   --  0.0  --     Recent Labs  Lab 12/22/19 1240 12/22/19 2200 12/23/19 0712 12/23/19 0816 12/24/19 0430  NA 140 142 144  --  142  K 6.1* 4.9 4.8  --  4.2  CL 109 110 111  --  112*  CO2 18* 17* 20*  --  20*  GLUCOSE 131* 213* 77  --  78  BUN 76* 82* 80*  --  69*  CREATININE 3.56* 3.73* 3.68*  --  3.62*  CALCIUM 7.7* 7.9* 8.0*  --  7.9*  AST 32  --   --   --  45*  ALT 27  --   --   --  31  ALKPHOS 51  --   --   --  43  BILITOT 0.7  --   --   --  0.5  ALBUMIN 1.7*  --   --   --  1.8*  MG  --   --  1.7  --  1.7  INR  --   --   --  1.2  --   BNP  --   --  65.8  --  118.1*    Recent Labs  Lab 12/22/19 1640 12/23/19 0712 12/24/19 0430  BNP  --  65.8 118.1*  SARSCOV2NAA NEGATIVE  --   --     ------------------------------------------------------------------------------------------------------------------ No results for input(s): CHOL, HDL, LDLCALC, TRIG, CHOLHDL, LDLDIRECT in the last 72 hours.  Lab Results  Component Value Date   HGBA1C 6.5 (H) 12/06/2019   ------------------------------------------------------------------------------------------------------------------ No results for input(s): TSH, T4TOTAL, T3FREE, THYROIDAB in the last 72 hours.  Invalid input(s): FREET3 ------------------------------------------------------------------------------------------------------------------ No results for input(s): VITAMINB12, FOLATE, FERRITIN, TIBC, IRON, RETICCTPCT in the last 72 hours.  Coagulation  profile Recent Labs  Lab 12/23/19 0816  INR 1.2    No results for input(s): DDIMER in the last 72 hours.  Cardiac Enzymes No results for input(s): CKMB, TROPONINI, MYOGLOBIN in the last 168 hours.  Invalid input(s): CK ------------------------------------------------------------------------------------------------------------------    Component Value Date/Time   BNP 118.1 (H) 12/24/2019 0430    Micro Results Recent Results (from the past 240 hour(s))  SARS  CORONAVIRUS 2 (TAT 6-24 HRS) Nasopharyngeal Nasopharyngeal Swab     Status: None   Collection Time: 12/22/19  4:40 PM   Specimen: Nasopharyngeal Swab  Result Value Ref Range Status   SARS Coronavirus 2 NEGATIVE NEGATIVE Final    Comment: (NOTE) SARS-CoV-2 target nucleic acids are NOT DETECTED. The SARS-CoV-2 RNA is generally detectable in upper and lower respiratory specimens during the acute phase of infection. Negative results do not preclude SARS-CoV-2 infection, do not rule out co-infections with other pathogens, and should not be used as the sole basis for treatment or other patient management decisions. Negative results must be combined with clinical observations, patient history, and epidemiological information. The expected result is Negative. Fact Sheet for Patients: SugarRoll.be Fact Sheet for Healthcare Providers: https://www.woods-mathews.com/ This test is not yet approved or cleared by the [place] FDA and  has been authorized for detection and/or diagnosis of SARS-CoV-2 by FDA under an Emergency Use Authorization (EUA). This EUA will remain  in effect (meaning this test can be used) for the duration of the COVID-19 declaration under Section 56 4(b)(1) of the Act, 21 U.S.C. section 360bbb-3(b)(1), unless the authorization is terminated or revoked sooner. Performed at [facility], [place] [address], [place], [place] [address]     Radiology Reports US  RENAL  Result Date: 12/23/2019 CLINICAL DATA:  Acute kidney injury history of bilateral renal transplants EXAM: RENAL / URINARY TRACT ULTRASOUND COMPLETE COMPARISON:  03/23/2018 FINDINGS: Right Kidney: Native right renal measurements: 8.9 x 6.1 x 5.3 cm. = volume: 145 mL Increased cortical echogenicity with decreased corticomedullary differentiation. Diffuse cortical thinning. No hydronephrosis. Cyst at the lower pole measuring 2 x 1.6 x 2.4 cm. Right lower quadrant renal transplant measurements: 12.4 x 6.4 x 6.8 cm = volume: 280.1 mL. Cortical echogenicity is normal. No hydronephrosis. No perinephric fluid collections. Left Kidney: Native left renal measurements: 9.2 by 4.9 x 5.1 cm = volume: 119 mL. Increased cortical echogenicity with decreased corticomedullary differentiation. Diffuse cortical thinning. Central anechoic area either representing parapelvic cyst or mild hydronephrosis. Targeted ultrasound of the left lower quadrant was performed. The left lower quadrant transplant kidney could not be identified. Bladder: Appears normal for degree of bladder distention. Other: None. IMPRESSION: 1. Status post bilateral lower quadrant renal transplants. The right lower quadrant renal transplant is within normal limits with no evidence for hydronephrosis or perinephric fluid collection. The left lower quadrant transplant could not be visualized. 2. Native kidneys are echogenic and atrophic. There is a cyst in the native right kidney. There is a parapelvic cysts or mild hydronephrosis of the left native kidney. Electronically Signed   By: Donavan Foil M.D.   On: 12/23/2019 18:19   DG Chest Port 1 View  Result Date: 12/22/2019 CLINICAL DATA:  Weakness, shortness of breath EXAM: PORTABLE CHEST 1 VIEW COMPARISON:  None. FINDINGS: Prior CABG. Cardiomegaly. Low lung volumes. Bibasilar scarring or atelectasis. No effusions or acute bony abnormality. IMPRESSION: Cardiomegaly.  Bibasilar scarring or atelectasis.  Electronically Signed   By: Rolm Baptise M.D.   On: 12/22/2019 14:33    Time Spent in minutes  30   Lala Lund M.D on 12/24/2019 at 10:44 AM  To page go to [email or web address] - password [facility]

## 2019-12-25 LAB — TYPE AND SCREEN
ABO/RH(D): O POS
Antibody Screen: NEGATIVE
Unit division: 0
Unit division: 0

## 2019-12-25 LAB — BPAM RBC
Blood Product Expiration Date: 202103242359
Blood Product Expiration Date: 202104232359
ISSUE DATE / TIME: 202103171515
ISSUE DATE / TIME: 202103191105
Unit Type and Rh: 5100
Unit Type and Rh: 5100

## 2019-12-25 LAB — CBC WITH DIFFERENTIAL/PLATELET
Abs Immature Granulocytes: 0.04 10*3/uL (ref 0.00–0.07)
Basophils Absolute: 0 10*3/uL (ref 0.0–0.1)
Basophils Relative: 0 %
Eosinophils Absolute: 0.1 10*3/uL (ref 0.0–0.5)
Eosinophils Relative: 2 %
HCT: 26.8 % — ABNORMAL LOW (ref 39.0–52.0)
Hemoglobin: 8.8 g/dL — ABNORMAL LOW (ref 13.0–17.0)
Immature Granulocytes: 1 %
Lymphocytes Relative: 32 %
Lymphs Abs: 1.5 10*3/uL (ref 0.7–4.0)
MCH: 32.2 pg (ref 26.0–34.0)
MCHC: 32.8 g/dL (ref 30.0–36.0)
MCV: 98.2 fL (ref 80.0–100.0)
Monocytes Absolute: 0.5 10*3/uL (ref 0.1–1.0)
Monocytes Relative: 12 %
Neutro Abs: 2.5 10*3/uL (ref 1.7–7.7)
Neutrophils Relative %: 53 %
Platelets: 158 10*3/uL (ref 150–400)
RBC: 2.73 MIL/uL — ABNORMAL LOW (ref 4.22–5.81)
RDW: 17.6 % — ABNORMAL HIGH (ref 11.5–15.5)
WBC: 4.7 10*3/uL (ref 4.0–10.5)
nRBC: 0 % (ref 0.0–0.2)

## 2019-12-25 LAB — GLUCOSE, CAPILLARY
Glucose-Capillary: 119 mg/dL — ABNORMAL HIGH (ref 70–99)
Glucose-Capillary: 198 mg/dL — ABNORMAL HIGH (ref 70–99)
Glucose-Capillary: 201 mg/dL — ABNORMAL HIGH (ref 70–99)
Glucose-Capillary: 214 mg/dL — ABNORMAL HIGH (ref 70–99)
Glucose-Capillary: 215 mg/dL — ABNORMAL HIGH (ref 70–99)
Glucose-Capillary: 80 mg/dL (ref 70–99)

## 2019-12-25 LAB — COMPREHENSIVE METABOLIC PANEL
ALT: 32 U/L (ref 0–44)
AST: 41 U/L (ref 15–41)
Albumin: 1.8 g/dL — ABNORMAL LOW (ref 3.5–5.0)
Alkaline Phosphatase: 51 U/L (ref 38–126)
Anion gap: 10 (ref 5–15)
BUN: 53 mg/dL — ABNORMAL HIGH (ref 8–23)
CO2: 22 mmol/L (ref 22–32)
Calcium: 8 mg/dL — ABNORMAL LOW (ref 8.9–10.3)
Chloride: 109 mmol/L (ref 98–111)
Creatinine, Ser: 3.4 mg/dL — ABNORMAL HIGH (ref 0.61–1.24)
GFR calc Af Amer: 20 mL/min — ABNORMAL LOW (ref >60)
GFR calc non Af Amer: 17 mL/min — ABNORMAL LOW (ref >60)
Glucose, Bld: 80 mg/dL (ref 70–99)
Potassium: 4.3 mmol/L (ref 3.5–5.1)
Sodium: 141 mmol/L (ref 135–145)
Total Bilirubin: 0.9 mg/dL (ref 0.3–1.2)
Total Protein: 4.7 g/dL — ABNORMAL LOW (ref 6.5–8.1)

## 2019-12-25 LAB — BRAIN NATRIURETIC PEPTIDE: B Natriuretic Peptide: 165.9 pg/mL — ABNORMAL HIGH (ref 0.0–100.0)

## 2019-12-25 LAB — MAGNESIUM: Magnesium: 1.5 mg/dL — ABNORMAL LOW (ref 1.7–2.4)

## 2019-12-25 MED ORDER — MAGNESIUM SULFATE 2 GM/50ML IV SOLN
2.0000 g | Freq: Once | INTRAVENOUS | Status: AC
  Administered 2019-12-25: 2 g via INTRAVENOUS
  Filled 2019-12-25: qty 50

## 2019-12-25 MED ORDER — HYDRALAZINE HCL 20 MG/ML IJ SOLN
5.0000 mg | Freq: Once | INTRAMUSCULAR | Status: AC
  Administered 2019-12-25: 5 mg via INTRAVENOUS
  Filled 2019-12-25: qty 1

## 2019-12-25 MED ORDER — METOPROLOL TARTRATE 25 MG PO TABS
25.0000 mg | ORAL_TABLET | Freq: Two times a day (BID) | ORAL | Status: DC
  Administered 2019-12-25 – 2019-12-27 (×4): 25 mg via ORAL
  Filled 2019-12-25 (×4): qty 1

## 2019-12-25 MED ORDER — CARVEDILOL 3.125 MG PO TABS: 3.1250 mg | ORAL_TABLET | Freq: Two times a day (BID) | ORAL | Status: DC

## 2019-12-25 NOTE — Progress Notes (Signed)
[facility] Progress Note    Assessment/ Plan:   1. AKI on CKD IV T: transplant since 1982.  Continue current IS.  AKI likely multifactorial and is hemodynamically mediated with possible obstructive uropathy too--> has Foley and Flomax.  Given vintage of renal transplant would expect ischemic insults to take longer to recover and I think blood transfusion will help. Don't suspect rejection.  UA bland except > 300 mg protein, will get UP/C, pending, this can be followed as an OP.      2.  GI bleed: EGD showing erosive esophagitis and gastric ulcers, Barrett's esophagus.  On PPI.  Will follow with GI as OP. S/p pRBCs 3/19.    3.  DM II: per primary  4.  HTN: low dose metoprolol  5.  Metabolic acidosis: sodium bicarb, improving  6. Dispo: Nothing further to add.  Will need close followup with primary nephrologist on discharge, [name]     Subjective:    Cr slightly better at 3.4 after blood yesterday.     Objective:   BP (!) 152/70 (BP Location: Right Arm)   Pulse 84   Temp 98.4 F (36.9 C) (Oral)   Resp 20   Ht 5\' 4"  (1.626 m)   Wt 75.3 kg   SpO2 100%   BMI 28.49 kg/m   Intake/Output Summary (Last 24 hours) at 12/25/2019 1108 Last data filed at 12/25/2019 0175 Gross per 24 hour  Intake 933 ml  Output 3101 ml  Net -2168 ml   Weight change:   Physical Exam: GEN NAD, lying flat in bed HEENT EOMI PERRL NECK no JVD PULM clear bilaterally CV RRR ABD obese, allograft without tenderness EXT no LE edema NEURO AAO x 3 SKIN multiple AK MSK no effusions  Imaging: US RENAL  Result Date: 12/23/2019 CLINICAL DATA:  Acute kidney injury history of bilateral renal transplants EXAM: RENAL / URINARY TRACT ULTRASOUND COMPLETE COMPARISON:  03/23/2018 FINDINGS: Right Kidney: Native right renal measurements: 8.9 x 6.1 x 5.3 cm. = volume: 145 mL Increased cortical echogenicity with decreased corticomedullary differentiation. Diffuse cortical thinning. No  hydronephrosis. Cyst at the lower pole measuring 2 x 1.6 x 2.4 cm. Right lower quadrant renal transplant measurements: 12.4 x 6.4 x 6.8 cm = volume: 280.1 mL. Cortical echogenicity is normal. No hydronephrosis. No perinephric fluid collections. Left Kidney: Native left renal measurements: 9.2 by 4.9 x 5.1 cm = volume: 119 mL. Increased cortical echogenicity with decreased corticomedullary differentiation. Diffuse cortical thinning. Central anechoic area either representing parapelvic cyst or mild hydronephrosis. Targeted ultrasound of the left lower quadrant was performed. The left lower quadrant transplant kidney could not be identified. Bladder: Appears normal for degree of bladder distention. Other: None. IMPRESSION: 1. Status post bilateral lower quadrant renal transplants. The right lower quadrant renal transplant is within normal limits with no evidence for hydronephrosis or perinephric fluid collection. The left lower quadrant transplant could not be visualized. 2. Native kidneys are echogenic and atrophic. There is a cyst in the native right kidney. There is a parapelvic cysts or mild hydronephrosis of the left native kidney. Electronically Signed   By: Donavan Foil M.D.   On: 12/23/2019 18:19    Labs: BMET Recent Labs  Lab 12/22/19 1240 12/22/19 2200 12/23/19 0712 12/24/19 0430 12/25/19 0503  NA 140 142 144 142 141  K 6.1* 4.9 4.8 4.2 4.3  CL 109 110 111 112* 109  CO2 18* 17* 20* 20* 22  GLUCOSE 131* 213* 77 78 80  BUN 76* 82* 80* 69* 53*  CREATININE 3.56* 3.73* 3.68* 3.62* 3.40*  CALCIUM 7.7* 7.9* 8.0* 7.9* 8.0*   CBC Recent Labs  Lab 12/23/19 1431 12/23/19 1431 12/24/19 0430 12/24/19 0813 12/24/19 1600 12/25/19 0503  WBC 7.6  --  5.3 4.7  --  4.7  NEUTROABS  --   --  3.0  --   --  2.5  HGB 9.1*   < > 7.1* 7.1* 9.8* 8.8*  HCT 27.6*   < > 21.4* 21.7* 30.0* 26.8*  MCV 99.6  --  101.9* 100.5*  --  98.2  PLT 193  --  163 166  --  158   < > = values in this interval not  displayed.    Medications:    . atorvastatin  10 mg Oral Daily  . azaTHIOprine  125 mg Oral Daily  . Chlorhexidine Gluconate Cloth  6 each Topical Daily  . ferrous sulfate  325 mg Oral TID WC  . insulin aspart  0-9 Units Subcutaneous Q6H  . insulin glargine  14 Units Subcutaneous QHS  . levothyroxine  50 mcg Oral Q0600  . metoprolol tartrate  25 mg Oral BID  . nicotine  14 mg Transdermal Daily  . pantoprazole (PROTONIX) IV  40 mg Intravenous Q12H  . predniSONE  5 mg Oral QODAY  . sodium bicarbonate  650 mg Oral BID  . sodium chloride flush  3 mL Intravenous Q12H  . tamsulosin  0.4 mg Oral Daily      Madelon Lips MD 12/25/2019, 11:08 AM

## 2019-12-25 NOTE — Progress Notes (Signed)
Patient BP 180/77. Notified Jeannette Corpus, NP.  Will continue to monitor the patient and notify as needed

## 2019-12-25 NOTE — Progress Notes (Signed)
Patient voided 200 mL post removal of foley catheter.  Bladder scanned patient afterwards and found 90 mL.  Will continue to monitor the patient and notify as needed

## 2019-12-25 NOTE — Progress Notes (Signed)
PROGRESS NOTE                                                                                                                                                                                                             Patient Demographics:    Victor Schultz, is a 69 y.o. male, DOB - [birth year], GYK:[id number]  Admit date - 12/22/2019   Admitting Physician Karmen Bongo, MD  Outpatient Primary MD for the patient is Ivy Lynn, NP, [place] - nephrology; Donnetta Hutching - cardiology  LOS - 3  Chief Complaint  Patient presents with  . Hematemesis       Brief Narrative    Victor Schultz is a 69 y.o. male with medical history significant of DM; HLD: renal transplant with recurrent advanced CKD; HTN; and CAD presenting with coffee ground emesis.  Was diagnosed with active upper GI bleed, ordered 1 unit of packed RBC on IV PPI, GI was consulted and he was admitted for further treatment.  Of note patient never has had EGD or colonoscopy before this admission.   Subjective:    Victor Schultz today has, No headache, No chest pain, No abdominal pain - No Nausea, No new weakness tingling or numbness, No Cough - SOB.     Assessment  & Plan :     1.  Acute blood loss anemia caused by acute upper GI bleed.  Received 1 unit of packed RBC on 12/23/2019, and another unit on December 24, 2019, H&H now stable, continue on IV PPI with clear liquids, underwent EGD on 12/23/2019 by [facility] showing esophagitis and a nonbleeding esophageal ulcer, continue to monitor H&H, advance diet and monitor.  2.  History of CKD 4 status post 2 kidney transplants.  Follow back [facility], baseline creatinine in chart review appears to be around 2.2 to 2.5, 1-year ago, he certainly could have progressed since then, for now could have had some AKI due to anemia, H&H currently stable, will transfuse PRN and try to keep his hemoglobin around 8, avoid nephrotoxins, he did have some urinary retention on 12/23/2019 with  about 400 cc of residual urine in the bladder hence he was started on Flomax and Foley was placed. However his creatinine seems to have plateaued around 3.5, nephrology following.  Renal ultrasound nonacute.  Continue to monitor.  3.  Dyslipidemia.  On statin.  4.  Hypothyroidism .  Home dose Synthroid.  5.  Hypertension.  Continue home dose beta-blocker.  6.  CAD.  Chest pain-free on combination of aspirin beta-blocker and statin for secondary prevention, aspirin on hold for GI bleed.   7.  Smoking.  Counseled to quit.   8. DM type II.  On Lantus and sliding scale.  Dose adjusted (reduced).  Gentle D5 while he is n.p.o. to avoid hypoglycemia, will monitor closely.  Lab Results  Component Value Date   HGBA1C 6.5 (H) 12/06/2019   CBG (last 3)  Recent Labs    12/24/19 2158 12/25/19 0055 12/25/19 0521  GLUCAP 246* 119* 80     Family Communication  :  Judieth Keens - [phone] message left on cell phone 12/23/2019 at 11:52 AM, 12/24/19 @ 10.51  Code Status :  Full  Disposition Plan  : Stay in the hospital, getting treatment for upper GI bleed requiring blood transfusion, being monitored closely.  Still on IV PPI and H&H being monitored closely.  Consults  :  GI  Procedures  :    EGD -   DVT Prophylaxis  :   SCDs    Lab Results  Component Value Date   PLT 158 12/25/2019    Diet :  Diet Order            DIET SOFT Room service appropriate? Yes; Fluid consistency: Thin  Diet effective now               Inpatient Medications Scheduled Meds: . atorvastatin  10 mg Oral Daily  . azaTHIOprine  125 mg Oral Daily  . Chlorhexidine Gluconate Cloth  6 each Topical Daily  . ferrous sulfate  325 mg Oral TID WC  . insulin aspart  0-9 Units Subcutaneous Q6H  . insulin glargine  14 Units Subcutaneous QHS  . levothyroxine  50 mcg Oral Q0600  . metoprolol tartrate  12.5 mg Oral BID  . nicotine  14 mg Transdermal Daily  . pantoprazole (PROTONIX) IV  40 mg Intravenous Q12H  .  predniSONE  5 mg Oral QODAY  . sodium bicarbonate  650 mg Oral BID  . sodium chloride flush  3 mL Intravenous Q12H  . tamsulosin  0.4 mg Oral Daily   Continuous Infusions: . magnesium sulfate bolus IVPB 2 g (12/25/19 0901)   PRN Meds:.acetaminophen **OR** [DISCONTINUED] acetaminophen, [DISCONTINUED] ondansetron **OR** ondansetron (ZOFRAN) IV  Antibiotics  :   Anti-infectives (From admission, onward)   Start     Dose/Rate Route Frequency Ordered Stop   12/22/19 1330  cefTRIAXone (ROCEPHIN) 1 g in sodium chloride 0.9 % 100 mL IVPB     1 g 200 mL/hr over 30 Minutes Intravenous  Once 12/22/19 1320 12/22/19 1439          Objective:   Vitals:   12/24/19 2126 12/25/19 0000 12/25/19 0523 12/25/19 0800  BP: (!) 168/77 (!) 141/72 (!) 153/67 (!) 152/70  Pulse: 95  89 84  Resp: 20     Temp: 98.4 F (36.9 C)  98.4 F (36.9 C)   TempSrc: Oral  Oral   SpO2: 100%  100% 100%  Weight:      Height:        SpO2: 100 % O2 Flow Rate (L/min): 2 L/min  Wt Readings from Last 3 Encounters:  12/23/19 75.3 kg  12/06/19 75.3 kg     Intake/Output Summary (Last 24 hours) at 12/25/2019 0921 Last data filed at 12/25/2019 0905 Gross per 24 hour  Intake 933 ml  Output 3101 ml  Net -2168 ml     Physical Exam  Awake Alert, No  new F.N deficits, Normal affect [place].AT,PERRAL Supple Neck,No JVD, No cervical lymphadenopathy appriciated.  Symmetrical Chest wall movement, Good air movement bilaterally, CTAB RRR,No Gallops, Rubs or new Murmurs, No Parasternal Heave +ve B.Sounds, Abd Soft, No tenderness, No organomegaly appriciated, No rebound - guarding or rigidity. No Cyanosis, Clubbing or edema, No new Rash or bruise    Data Review:    Recent Labs  Lab 12/23/19 0712 12/23/19 0712 12/23/19 1431 12/24/19 0430 12/24/19 0813 12/24/19 1600 12/25/19 0503  WBC 9.1  --  7.6 5.3 4.7  --  4.7  HGB 9.2*   < > 9.1* 7.1* 7.1* 9.8* 8.8*  HCT 27.7*   < > 27.6* 21.4* 21.7* 30.0* 26.8*  PLT 212  --   193 163 166  --  158  MCV 99.3  --  99.6 101.9* 100.5*  --  98.2  MCH 33.0  --  32.9 33.8 32.9  --  32.2  MCHC 33.2  --  33.0 33.2 32.7  --  32.8  RDW 21.4*  --  21.6* 20.3* 19.8*  --  17.6*  LYMPHSABS  --   --   --  1.5  --   --  1.5  MONOABS  --   --   --  0.5  --   --  0.5  EOSABS  --   --   --  0.1  --   --  0.1  BASOSABS  --   --   --  0.0  --   --  0.0   < > = values in this interval not displayed.    Recent Labs  Lab 12/22/19 1240 12/22/19 2200 12/23/19 0712 12/23/19 0816 12/24/19 0430 12/25/19 0503  NA 140 142 144  --  142 141  K 6.1* 4.9 4.8  --  4.2 4.3  CL 109 110 111  --  112* 109  CO2 18* 17* 20*  --  20* 22  GLUCOSE 131* 213* 77  --  78 80  BUN 76* 82* 80*  --  69* 53*  CREATININE 3.56* 3.73* 3.68*  --  3.62* 3.40*  CALCIUM 7.7* 7.9* 8.0*  --  7.9* 8.0*  AST 32  --   --   --  45* 41  ALT 27  --   --   --  31 32  ALKPHOS 51  --   --   --  43 51  BILITOT 0.7  --   --   --  0.5 0.9  ALBUMIN 1.7*  --   --   --  1.8* 1.8*  MG  --   --  1.7  --  1.7 1.5*  INR  --   --   --  1.2  --   --   BNP  --   --  65.8  --  118.1* 165.9*    Recent Labs  Lab 12/22/19 1640 12/23/19 0712 12/24/19 0430 12/25/19 0503  BNP  --  65.8 118.1* 165.9*  SARSCOV2NAA NEGATIVE  --   --   --     ------------------------------------------------------------------------------------------------------------------ No results for input(s): CHOL, HDL, LDLCALC, TRIG, CHOLHDL, LDLDIRECT in the last 72 hours.  Lab Results  Component Value Date   HGBA1C 6.5 (H) 12/06/2019   ------------------------------------------------------------------------------------------------------------------ No results for input(s): TSH, T4TOTAL, T3FREE, THYROIDAB in the last 72 hours.  Invalid input(s): FREET3 ------------------------------------------------------------------------------------------------------------------ No results for input(s): VITAMINB12, FOLATE, FERRITIN, TIBC, IRON, RETICCTPCT in the  last 72 hours.  Coagulation profile Recent Labs  Lab 12/23/19  5631  INR 1.2    No results for input(s): DDIMER in the last 72 hours.  Cardiac Enzymes No results for input(s): CKMB, TROPONINI, MYOGLOBIN in the last 168 hours.  Invalid input(s): CK ------------------------------------------------------------------------------------------------------------------    Component Value Date/Time   BNP 165.9 (H) 12/25/2019 0503    Micro Results Recent Results (from the past 240 hour(s))  SARS CORONAVIRUS 2 (TAT 6-24 HRS) Nasopharyngeal Nasopharyngeal Swab     Status: None   Collection Time: 12/22/19  4:40 PM   Specimen: Nasopharyngeal Swab  Result Value Ref Range Status   SARS Coronavirus 2 NEGATIVE NEGATIVE Final    Comment: (NOTE) SARS-CoV-2 target nucleic acids are NOT DETECTED. The SARS-CoV-2 RNA is generally detectable in upper and lower respiratory specimens during the acute phase of infection. Negative results do not preclude SARS-CoV-2 infection, do not rule out co-infections with other pathogens, and should not be used as the sole basis for treatment or other patient management decisions. Negative results must be combined with clinical observations, patient history, and epidemiological information. The expected result is Negative. Fact Sheet for Patients: SugarRoll.be Fact Sheet for Healthcare Providers: https://www.woods-mathews.com/ This test is not yet approved or cleared by the [place] FDA and  has been authorized for detection and/or diagnosis of SARS-CoV-2 by FDA under an Emergency Use Authorization (EUA). This EUA will remain  in effect (meaning this test can be used) for the duration of the COVID-19 declaration under Section 56 4(b)(1) of the Act, 21 U.S.C. section 360bbb-3(b)(1), unless the authorization is terminated or revoked sooner. Performed at [facility], [place] [address] [place], [place] [address]      Radiology Reports US RENAL  Result Date: 12/23/2019 CLINICAL DATA:  Acute kidney injury history of bilateral renal transplants EXAM: RENAL / URINARY TRACT ULTRASOUND COMPLETE COMPARISON:  03/23/2018 FINDINGS: Right Kidney: Native right renal measurements: 8.9 x 6.1 x 5.3 cm. = volume: 145 mL Increased cortical echogenicity with decreased corticomedullary differentiation. Diffuse cortical thinning. No hydronephrosis. Cyst at the lower pole measuring 2 x 1.6 x 2.4 cm. Right lower quadrant renal transplant measurements: 12.4 x 6.4 x 6.8 cm = volume: 280.1 mL. Cortical echogenicity is normal. No hydronephrosis. No perinephric fluid collections. Left Kidney: Native left renal measurements: 9.2 by 4.9 x 5.1 cm = volume: 119 mL. Increased cortical echogenicity with decreased corticomedullary differentiation. Diffuse cortical thinning. Central anechoic area either representing parapelvic cyst or mild hydronephrosis. Targeted ultrasound of the left lower quadrant was performed. The left lower quadrant transplant kidney could not be identified. Bladder: Appears normal for degree of bladder distention. Other: None. IMPRESSION: 1. Status post bilateral lower quadrant renal transplants. The right lower quadrant renal transplant is within normal limits with no evidence for hydronephrosis or perinephric fluid collection. The left lower quadrant transplant could not be visualized. 2. Native kidneys are echogenic and atrophic. There is a cyst in the native right kidney. There is a parapelvic cysts or mild hydronephrosis of the left native kidney. Electronically Signed   By: Donavan Foil M.D.   On: 12/23/2019 18:19   DG Chest Port 1 View  Result Date: 12/22/2019 CLINICAL DATA:  Weakness, shortness of breath EXAM: PORTABLE CHEST 1 VIEW COMPARISON:  None. FINDINGS: Prior CABG. Cardiomegaly. Low lung volumes. Bibasilar scarring or atelectasis. No effusions or acute bony abnormality. IMPRESSION: Cardiomegaly.  Bibasilar  scarring or atelectasis. Electronically Signed   By: Rolm Baptise M.D.   On: 12/22/2019 14:33    Time Spent in minutes  30  Lala Lund M.D on 12/25/2019 at 9:21 AM  To page go to [email or web address] - password [email or web address]

## 2019-12-26 LAB — CBC WITH DIFFERENTIAL/PLATELET
Abs Immature Granulocytes: 0.03 10*3/uL (ref 0.00–0.07)
Basophils Absolute: 0 10*3/uL (ref 0.0–0.1)
Basophils Relative: 0 %
Eosinophils Absolute: 0.1 10*3/uL (ref 0.0–0.5)
Eosinophils Relative: 1 %
HCT: 26.3 % — ABNORMAL LOW (ref 39.0–52.0)
Hemoglobin: 8.5 g/dL — ABNORMAL LOW (ref 13.0–17.0)
Immature Granulocytes: 1 %
Lymphocytes Relative: 35 %
Lymphs Abs: 1.8 10*3/uL (ref 0.7–4.0)
MCH: 32.1 pg (ref 26.0–34.0)
MCHC: 32.3 g/dL (ref 30.0–36.0)
MCV: 99.2 fL (ref 80.0–100.0)
Monocytes Absolute: 0.6 10*3/uL (ref 0.1–1.0)
Monocytes Relative: 11 %
Neutro Abs: 2.7 10*3/uL (ref 1.7–7.7)
Neutrophils Relative %: 52 %
Platelets: 161 10*3/uL (ref 150–400)
RBC: 2.65 MIL/uL — ABNORMAL LOW (ref 4.22–5.81)
RDW: 17.2 % — ABNORMAL HIGH (ref 11.5–15.5)
WBC: 5.1 10*3/uL (ref 4.0–10.5)
nRBC: 0 % (ref 0.0–0.2)

## 2019-12-26 LAB — MAGNESIUM: Magnesium: 2.2 mg/dL (ref 1.7–2.4)

## 2019-12-26 LAB — COMPREHENSIVE METABOLIC PANEL
ALT: 31 U/L (ref 0–44)
AST: 33 U/L (ref 15–41)
Albumin: 1.8 g/dL — ABNORMAL LOW (ref 3.5–5.0)
Alkaline Phosphatase: 56 U/L (ref 38–126)
Anion gap: 9 (ref 5–15)
BUN: 61 mg/dL — ABNORMAL HIGH (ref 8–23)
CO2: 20 mmol/L — ABNORMAL LOW (ref 22–32)
Calcium: 7.9 mg/dL — ABNORMAL LOW (ref 8.9–10.3)
Chloride: 110 mmol/L (ref 98–111)
Creatinine, Ser: 4.09 mg/dL — ABNORMAL HIGH (ref 0.61–1.24)
GFR calc Af Amer: 16 mL/min — ABNORMAL LOW (ref >60)
GFR calc non Af Amer: 14 mL/min — ABNORMAL LOW (ref >60)
Glucose, Bld: 139 mg/dL — ABNORMAL HIGH (ref 70–99)
Potassium: 4.6 mmol/L (ref 3.5–5.1)
Sodium: 139 mmol/L (ref 135–145)
Total Bilirubin: 0.7 mg/dL (ref 0.3–1.2)
Total Protein: 5.1 g/dL — ABNORMAL LOW (ref 6.5–8.1)

## 2019-12-26 LAB — GLUCOSE, CAPILLARY
Glucose-Capillary: 101 mg/dL — ABNORMAL HIGH (ref 70–99)
Glucose-Capillary: 104 mg/dL — ABNORMAL HIGH (ref 70–99)
Glucose-Capillary: 142 mg/dL — ABNORMAL HIGH (ref 70–99)
Glucose-Capillary: 182 mg/dL — ABNORMAL HIGH (ref 70–99)
Glucose-Capillary: 200 mg/dL — ABNORMAL HIGH (ref 70–99)
Glucose-Capillary: 218 mg/dL — ABNORMAL HIGH (ref 70–99)

## 2019-12-26 LAB — BRAIN NATRIURETIC PEPTIDE: B Natriuretic Peptide: 153.3 pg/mL — ABNORMAL HIGH (ref 0.0–100.0)

## 2019-12-26 MED ORDER — HYDRALAZINE HCL 20 MG/ML IJ SOLN
5.0000 mg | Freq: Four times a day (QID) | INTRAMUSCULAR | Status: DC | PRN
  Administered 2019-12-26: 5 mg via INTRAVENOUS
  Filled 2019-12-26: qty 1

## 2019-12-26 NOTE — Progress Notes (Signed)
PROGRESS NOTE                                                                                                                                                                                                             Patient Demographics:    Victor Schultz, is a 69 y.o. male, DOB - 01-05-1951, KGM:[id number]  Admit date - 12/22/2019   Admitting Physician Karmen Bongo, MD  Outpatient Primary MD for the patient is Ivy Lynn, NP, [place] - nephrology; Donnetta Hutching - cardiology  LOS - 4  Chief Complaint  Patient presents with  . Hematemesis       Brief Narrative    Victor Schultz is a 69 y.o. male with medical history significant of DM; HLD: renal transplant with recurrent advanced CKD; HTN; and CAD presenting with coffee ground emesis.  Was diagnosed with active upper GI bleed, ordered 1 unit of packed RBC on IV PPI, GI was consulted and he was admitted for further treatment.  Of note patient never has had EGD or colonoscopy before this admission.   Subjective:   Patient in bed, appears comfortable, denies any headache, no fever, no chest pain or pressure, no shortness of breath , no abdominal pain. No focal weakness.   Assessment  & Plan :     1.  Acute blood loss anemia caused by acute upper GI bleed.  Received 1 unit of packed RBC on 12/23/2019, and another unit on December 24, 2019, H&H now stable, continue on IV PPI with clear liquids, underwent EGD on 12/23/2019 by [facility] showing esophagitis and a nonbleeding esophageal ulcer, continue to monitor H&H, advance diet and monitor.  2.  History of CKD 4 status post 2 kidney transplants.  Follow back [facility], baseline creatinine in chart review appears to be around 2.2 to 2.5, 1-year ago, he certainly could have progressed since then, for now could have had some AKI due to anemia, H&H currently stable, will transfuse PRN and try to keep his hemoglobin around 8, avoid nephrotoxins, he did have some urinary retention on  12/23/2019 with about 400 cc of residual urine in the bladder hence he was started on Flomax and Foley was placed.  Foley was removed post void residuals around 200 cc however with worsening renal function will replace Foley and leave it in this admission, nephrology on board will continue to monitor.  Currently no uremic symptoms with good urine output and stable potassium.  3.  Dyslipidemia.  On statin.  4.  Hypothyroidism .  Home dose Synthroid.  5.  Hypertension.  Continue home dose beta-blocker.  6.  CAD.  Chest pain-free on combination of aspirin beta-blocker and statin for secondary prevention, aspirin on hold for GI bleed.   7.  Smoking.  Counseled to quit.   8. DM type II.  On Lantus and sliding scale.  Dose adjusted (reduced).  Gentle D5 while he is n.p.o. to avoid hypoglycemia, will monitor closely.  Lab Results  Component Value Date   HGBA1C 6.5 (H) 12/06/2019   CBG (last 3)  Recent Labs    12/25/19 2305 12/26/19 0540 12/26/19 0804  GLUCAP 198* 104* 101*     Family Communication  :  Judieth Keens - [phone] message left on cell phone 12/23/2019 at 11:52 AM, 12/24/19 @ 10.51, 12/26/19 at 9:49 AM message left.  Code Status :  Full  Disposition Plan  : Stay in the hospital, getting treatment for upper GI bleed requiring blood transfusion, being monitored closely.  Still on IV PPI and H&H being monitored closely.  Consults  :  GI  Procedures  :    EGD -   DVT Prophylaxis  :   SCDs    Lab Results  Component Value Date   PLT 161 12/26/2019    Diet :  Diet Order            DIET SOFT Room service appropriate? Yes; Fluid consistency: Thin  Diet effective now               Inpatient Medications Scheduled Meds: . atorvastatin  10 mg Oral Daily  . azaTHIOprine  125 mg Oral Daily  . Chlorhexidine Gluconate Cloth  6 each Topical Daily  . ferrous sulfate  325 mg Oral TID WC  . insulin aspart  0-9 Units Subcutaneous Q6H  . insulin glargine  14 Units  Subcutaneous QHS  . levothyroxine  50 mcg Oral Q0600  . metoprolol tartrate  25 mg Oral BID  . nicotine  14 mg Transdermal Daily  . pantoprazole (PROTONIX) IV  40 mg Intravenous Q12H  . predniSONE  5 mg Oral QODAY  . sodium bicarbonate  650 mg Oral BID  . sodium chloride flush  3 mL Intravenous Q12H  . tamsulosin  0.4 mg Oral Daily   Continuous Infusions:  PRN Meds:.acetaminophen **OR** [DISCONTINUED] acetaminophen, [DISCONTINUED] ondansetron **OR** ondansetron (ZOFRAN) IV  Antibiotics  :   Anti-infectives (From admission, onward)   Start     Dose/Rate Route Frequency Ordered Stop   12/22/19 1330  cefTRIAXone (ROCEPHIN) 1 g in sodium chloride 0.9 % 100 mL IVPB     1 g 200 mL/hr over 30 Minutes Intravenous  Once 12/22/19 1320 12/22/19 1439          Objective:   Vitals:   12/25/19 2155 12/25/19 2303 12/26/19 0012 12/26/19 0538  BP: (!) 174/78 (!) 177/80 (!) 158/68 (!) 162/78  Pulse:  95  96  Resp:    15  Temp:    98 F (36.7 C)  TempSrc:    Oral  SpO2:    97%  Weight:    74.1 kg  Height:        SpO2: 97 % O2 Flow Rate (L/min): 2 L/min  Wt Readings from Last 3 Encounters:  12/26/19 74.1 kg  12/06/19 75.3 kg     Intake/Output Summary (Last 24 hours) at 12/26/2019 0946 Last data filed at 12/26/2019 0900 Gross per 24 hour  Intake 1850 ml  Output  1700 ml  Net 150 ml     Physical Exam  Awake Alert, No new F.N deficits, Normal affect [place].AT,PERRAL Supple Neck,No JVD, No cervical lymphadenopathy appriciated.  Symmetrical Chest wall movement, Good air movement bilaterally, CTAB RRR,No Gallops, Rubs or new Murmurs, No Parasternal Heave +ve B.Sounds, Abd Soft, No tenderness, No organomegaly appriciated, No rebound - guarding or rigidity. No Cyanosis, Clubbing or edema, No new Rash or bruise     Data Review:    Recent Labs  Lab 12/23/19 1431 12/23/19 1431 12/24/19 0430 12/24/19 0813 12/24/19 1600 12/25/19 0503 12/26/19 0210  WBC 7.6  --  5.3 4.7  --   4.7 5.1  HGB 9.1*   < > 7.1* 7.1* 9.8* 8.8* 8.5*  HCT 27.6*   < > 21.4* 21.7* 30.0* 26.8* 26.3*  PLT 193  --  163 166  --  158 161  MCV 99.6  --  101.9* 100.5*  --  98.2 99.2  MCH 32.9  --  33.8 32.9  --  32.2 32.1  MCHC 33.0  --  33.2 32.7  --  32.8 32.3  RDW 21.6*  --  20.3* 19.8*  --  17.6* 17.2*  LYMPHSABS  --   --  1.5  --   --  1.5 1.8  MONOABS  --   --  0.5  --   --  0.5 0.6  EOSABS  --   --  0.1  --   --  0.1 0.1  BASOSABS  --   --  0.0  --   --  0.0 0.0   < > = values in this interval not displayed.    Recent Labs  Lab 12/22/19 1240 12/22/19 1240 12/22/19 2200 12/23/19 0712 12/23/19 0816 12/24/19 0430 12/25/19 0503 12/26/19 0210  NA 140   < > 142 144  --  142 141 139  K 6.1*   < > 4.9 4.8  --  4.2 4.3 4.6  CL 109   < > 110 111  --  112* 109 110  CO2 18*   < > 17* 20*  --  20* 22 20*  GLUCOSE 131*   < > 213* 77  --  78 80 139*  BUN 76*   < > 82* 80*  --  69* 53* 61*  CREATININE 3.56*   < > 3.73* 3.68*  --  3.62* 3.40* 4.09*  CALCIUM 7.7*   < > 7.9* 8.0*  --  7.9* 8.0* 7.9*  AST 32  --   --   --   --  45* 41 33  ALT 27  --   --   --   --  31 32 31  ALKPHOS 51  --   --   --   --  43 51 56  BILITOT 0.7  --   --   --   --  0.5 0.9 0.7  ALBUMIN 1.7*  --   --   --   --  1.8* 1.8* 1.8*  MG  --   --   --  1.7  --  1.7 1.5* 2.2  INR  --   --   --   --  1.2  --   --   --   BNP  --   --   --  65.8  --  118.1* 165.9* 153.3*   < > = values in this interval not displayed.    Recent Labs  Lab 12/22/19 1640 12/23/19 0712 12/24/19 0430 12/25/19  0503 12/26/19 0210  BNP  --  65.8 118.1* 165.9* 153.3*  SARSCOV2NAA NEGATIVE  --   --   --   --     ------------------------------------------------------------------------------------------------------------------ No results for input(s): CHOL, HDL, LDLCALC, TRIG, CHOLHDL, LDLDIRECT in the last 72 hours.  Lab Results  Component Value Date   HGBA1C 6.5 (H) 12/06/2019    ------------------------------------------------------------------------------------------------------------------ No results for input(s): TSH, T4TOTAL, T3FREE, THYROIDAB in the last 72 hours.  Invalid input(s): FREET3 ------------------------------------------------------------------------------------------------------------------ No results for input(s): VITAMINB12, FOLATE, FERRITIN, TIBC, IRON, RETICCTPCT in the last 72 hours.  Coagulation profile Recent Labs  Lab 12/23/19 0816  INR 1.2    No results for input(s): DDIMER in the last 72 hours.  Cardiac Enzymes No results for input(s): CKMB, TROPONINI, MYOGLOBIN in the last 168 hours.  Invalid input(s): CK ------------------------------------------------------------------------------------------------------------------    Component Value Date/Time   BNP 153.3 (H) 12/26/2019 0210    Micro Results Recent Results (from the past 240 hour(s))  SARS CORONAVIRUS 2 (TAT 6-24 HRS) Nasopharyngeal Nasopharyngeal Swab     Status: None   Collection Time: 12/22/19  4:40 PM   Specimen: Nasopharyngeal Swab  Result Value Ref Range Status   SARS Coronavirus 2 NEGATIVE NEGATIVE Final    Comment: (NOTE) SARS-CoV-2 target nucleic acids are NOT DETECTED. The SARS-CoV-2 RNA is generally detectable in upper and lower respiratory specimens during the acute phase of infection. Negative results do not preclude SARS-CoV-2 infection, do not rule out co-infections with other pathogens, and should not be used as the sole basis for treatment or other patient management decisions. Negative results must be combined with clinical observations, patient history, and epidemiological information. The expected result is Negative. Fact Sheet for Patients: SugarRoll.be Fact Sheet for Healthcare Providers: https://www.woods-mathews.com/ This test is not yet approved or cleared by the [place] FDA and  has been  authorized for detection and/or diagnosis of SARS-CoV-2 by FDA under an Emergency Use Authorization (EUA). This EUA will remain  in effect (meaning this test can be used) for the duration of the COVID-19 declaration under Section 56 4(b)(1) of the Act, 21 U.S.C. section 360bbb-3(b)(1), unless the authorization is terminated or revoked sooner. Performed at [facility], [place] [address], [place], [place] [address]     Radiology Reports US RENAL  Result Date: 12/23/2019 CLINICAL DATA:  Acute kidney injury history of bilateral renal transplants EXAM: RENAL / URINARY TRACT ULTRASOUND COMPLETE COMPARISON:  03/23/2018 FINDINGS: Right Kidney: Native right renal measurements: 8.9 x 6.1 x 5.3 cm. = volume: 145 mL Increased cortical echogenicity with decreased corticomedullary differentiation. Diffuse cortical thinning. No hydronephrosis. Cyst at the lower pole measuring 2 x 1.6 x 2.4 cm. Right lower quadrant renal transplant measurements: 12.4 x 6.4 x 6.8 cm = volume: 280.1 mL. Cortical echogenicity is normal. No hydronephrosis. No perinephric fluid collections. Left Kidney: Native left renal measurements: 9.2 by 4.9 x 5.1 cm = volume: 119 mL. Increased cortical echogenicity with decreased corticomedullary differentiation. Diffuse cortical thinning. Central anechoic area either representing parapelvic cyst or mild hydronephrosis. Targeted ultrasound of the left lower quadrant was performed. The left lower quadrant transplant kidney could not be identified. Bladder: Appears normal for degree of bladder distention. Other: None. IMPRESSION: 1. Status post bilateral lower quadrant renal transplants. The right lower quadrant renal transplant is within normal limits with no evidence for hydronephrosis or perinephric fluid collection. The left lower quadrant transplant could not be visualized. 2. Native kidneys are echogenic and atrophic. There is a cyst in the native  right kidney. There is a parapelvic cysts or  mild hydronephrosis of the left native kidney. Electronically Signed   By: Donavan Foil M.D.   On: 12/23/2019 18:19   DG Chest Port 1 View  Result Date: 12/22/2019 CLINICAL DATA:  Weakness, shortness of breath EXAM: PORTABLE CHEST 1 VIEW COMPARISON:  None. FINDINGS: Prior CABG. Cardiomegaly. Low lung volumes. Bibasilar scarring or atelectasis. No effusions or acute bony abnormality. IMPRESSION: Cardiomegaly.  Bibasilar scarring or atelectasis. Electronically Signed   By: Rolm Baptise M.D.   On: 12/22/2019 14:33    Time Spent in minutes  30   Lala Lund M.D on 12/26/2019 at 9:46 AM  To page go to [email or web address] - password [email or web address]

## 2019-12-26 NOTE — Progress Notes (Signed)
Patient voided 400 mL.  Bladder scanned the patient and found 20 mL.  Will continue to monitor the patient and notify as needed

## 2019-12-26 NOTE — Progress Notes (Signed)
Patient BP 162/78 .  Notified Jeannette Corpus, NP. Will continue to monitor the patient

## 2019-12-26 NOTE — Progress Notes (Signed)
Patient BP 182/75. No PRN orders. Notified Jeannette Corpus, NP.  Will continue to monitor the patient

## 2019-12-27 LAB — CBC WITH DIFFERENTIAL/PLATELET
Abs Immature Granulocytes: 0.04 10*3/uL (ref 0.00–0.07)
Basophils Absolute: 0 10*3/uL (ref 0.0–0.1)
Basophils Relative: 0 %
Eosinophils Absolute: 0.1 10*3/uL (ref 0.0–0.5)
Eosinophils Relative: 2 %
HCT: 26.8 % — ABNORMAL LOW (ref 39.0–52.0)
Hemoglobin: 8.7 g/dL — ABNORMAL LOW (ref 13.0–17.0)
Immature Granulocytes: 1 %
Lymphocytes Relative: 36 %
Lymphs Abs: 1.9 10*3/uL (ref 0.7–4.0)
MCH: 32.7 pg (ref 26.0–34.0)
MCHC: 32.5 g/dL (ref 30.0–36.0)
MCV: 100.8 fL — ABNORMAL HIGH (ref 80.0–100.0)
Monocytes Absolute: 0.7 10*3/uL (ref 0.1–1.0)
Monocytes Relative: 13 %
Neutro Abs: 2.6 10*3/uL (ref 1.7–7.7)
Neutrophils Relative %: 48 %
Platelets: 183 10*3/uL (ref 150–400)
RBC: 2.66 MIL/uL — ABNORMAL LOW (ref 4.22–5.81)
RDW: 17.2 % — ABNORMAL HIGH (ref 11.5–15.5)
WBC: 5.3 10*3/uL (ref 4.0–10.5)
nRBC: 0 % (ref 0.0–0.2)

## 2019-12-27 LAB — COMPREHENSIVE METABOLIC PANEL
ALT: 31 U/L (ref 0–44)
AST: 35 U/L (ref 15–41)
Albumin: 1.7 g/dL — ABNORMAL LOW (ref 3.5–5.0)
Alkaline Phosphatase: 53 U/L (ref 38–126)
Anion gap: 9 (ref 5–15)
BUN: 63 mg/dL — ABNORMAL HIGH (ref 8–23)
CO2: 20 mmol/L — ABNORMAL LOW (ref 22–32)
Calcium: 7.9 mg/dL — ABNORMAL LOW (ref 8.9–10.3)
Chloride: 112 mmol/L — ABNORMAL HIGH (ref 98–111)
Creatinine, Ser: 3.66 mg/dL — ABNORMAL HIGH (ref 0.61–1.24)
GFR calc Af Amer: 18 mL/min — ABNORMAL LOW (ref >60)
GFR calc non Af Amer: 16 mL/min — ABNORMAL LOW (ref >60)
Glucose, Bld: 90 mg/dL (ref 70–99)
Potassium: 4.7 mmol/L (ref 3.5–5.1)
Sodium: 141 mmol/L (ref 135–145)
Total Bilirubin: 0.7 mg/dL (ref 0.3–1.2)
Total Protein: 5 g/dL — ABNORMAL LOW (ref 6.5–8.1)

## 2019-12-27 LAB — GLUCOSE, CAPILLARY
Glucose-Capillary: 88 mg/dL (ref 70–99)
Glucose-Capillary: 78 mg/dL (ref 70–99)

## 2019-12-27 LAB — MAGNESIUM: Magnesium: 2 mg/dL (ref 1.7–2.4)

## 2019-12-27 LAB — BRAIN NATRIURETIC PEPTIDE: B Natriuretic Peptide: 187.4 pg/mL — ABNORMAL HIGH (ref 0.0–100.0)

## 2019-12-27 MED ORDER — ASPIRIN 81 MG PO TBEC: 81.0000 mg | DELAYED_RELEASE_TABLET | Freq: Every day | ORAL | 12 refills | Status: DC

## 2019-12-27 MED ORDER — TAMSULOSIN HCL 0.4 MG PO CAPS: 0.4000 mg | ORAL_CAPSULE | Freq: Every day | ORAL | 0 refills | Status: DC

## 2019-12-27 MED ORDER — PANTOPRAZOLE SODIUM 40 MG PO TBEC: 40.0000 mg | DELAYED_RELEASE_TABLET | Freq: Two times a day (BID) | ORAL | 2 refills | Status: DC

## 2019-12-27 NOTE — Care Management Important Message (Signed)
Important Message  Patient Details  Name: Victor Schultz MRN: [id number] Date of Birth: 04-16-1951   Medicare Important Message Given:  Yes - Important Message mailed due to current National Emergency  Verbal consent obtained due to current National Emergency  Relationship to patient: Self Contact Name: Andre Swander Call Date: 12/27/19  Time: 9432 Phone: [phone] Outcome: Spoke with contact Important Message mailed to: Patient address on file    Delorse Lek 12/27/2019, 8:57 AM

## 2019-12-27 NOTE — Progress Notes (Signed)
Victor Schultz to be D/C'd Home per MD order.  Discussed with the patient and all questions fully answered.  VSS, Skin clean, dry and intact without evidence of skin break down, no evidence of skin tears noted. IV catheter discontinued intact. Site without signs and symptoms of complications. Dressing and pressure applied.  An After Visit Summary was printed and given to the patient. Patient received prescription.  D/c education completed with patient/family including follow up instructions, medication list, d/c activities limitations if indicated, with other d/c instructions as indicated by MD - patient able to verbalize understanding, all questions fully answered.   Patient instructed to return to ED, call 911, or call MD for any changes in condition.   Patient escorted via [place], and D/C home via private auto.  [name] 12/27/2019 11:38 AM

## 2019-12-27 NOTE — Discharge Instructions (Signed)
Follow with Primary MD Ivy Lynn, NP and your nephrologist in 7 days   Get CBC, CMP  checked next visit within 1 week by Primary MD   Activity: As tolerated with Full fall precautions use walker/cane & assistance as needed  Disposition Home    Diet: Heart Healthy  Low Carb  Special Instructions: If you have smoked or chewed Tobacco  in the last 2 yrs please stop smoking, stop any regular Alcohol  and or any Recreational drug use.  On your next visit with your primary care physician please Get Medicines reviewed and adjusted.  Please request your Prim.MD to go over all Hospital Tests and Procedure/Radiological results at the follow up, please get all Hospital records sent to your Prim MD by signing hospital release before you go home.  If you experience worsening of your admission symptoms, develop shortness of breath, life threatening emergency, suicidal or homicidal thoughts you must seek medical attention immediately by calling 911 or calling your MD immediately  if symptoms less severe.  You Must read complete instructions/literature along with all the possible adverse reactions/side effects for all the Medicines you take and that have been prescribed to you. Take any new Medicines after you have completely understood and accpet all the possible adverse reactions/side effects.

## 2019-12-27 NOTE — Discharge Summary (Signed)
Victor Schultz NOI:[id number] DOB: August 02, 1951 DOA: 12/22/2019  PCP: Ivy Lynn, NP  Admit date: 12/22/2019  Discharge date: 12/27/2019  Admitted From:  Home   Disposition:  Home   Recommendations for Outpatient Follow-up:   Follow up with PCP in 1-2 weeks  PCP Please obtain BMP/CBC, 2 view CXR in 1week,  (see Discharge instructions)   PCP Please follow up on the following pending results: Follow CBC and BMP closely, needs outpatient GI, urology and nephrology follow-up within 1 to 2 weeks.   Home Health: None Equipment/Devices: None Consultations: GI, renal Discharge Condition: Stable    CODE STATUS: Full    Diet Recommendation: Heart Healthy Low Carb    Chief Complaint  Patient presents with  . Hematemesis     Brief history of present illness from the day of admission and additional interim summary    Victor Schultz a 68 y.o.malewith medical history significant ofDM; HLD: renal transplant with recurrentadvanced CKD; HTN; and CAD presenting with coffee ground emesis. Was diagnosed with active upper GI bleed, ordered 1 unit of packed RBC on IV PPI, GI was consulted and he was admitted for further treatment.  Of note patient never has had EGD or colonoscopy before this admission.                                                                 Hospital Course       1.  Acute blood loss anemia caused by acute upper GI bleed.  Received 1 unit of packed RBC on 12/23/2019, and another unit on December 24, 2019, H&H now stable, continue on IV PPI with clear liquids, underwent EGD on 12/23/2019 by [facility] showing esophagitis and a nonbleeding esophageal ulcer, H&H is now stable tolerating soft diet, will be discharged home on twice daily PPI with outpatient GI and PCP follow-up, aspirin being held for 10 more  days.  2.  History of CKD 4 status post 2 kidney transplants.  Follow back [facility], baseline creatinine in chart review appears to be around 2.2 to 2.5, 1-year ago, he certainly could have progressed since then, there was also a question of urinary retention as his postvoid residuals were around 300 cc, he was seen by nephrology, renal ultrasound was nonacute, he underwent placement of Foley catheter and initiation of Flomax with good improvement in his renal function, his creatinine peaked around 4.2, he will be discharged on Foley Flomax with outpatient nephrology, PCP and urology follow-up..  3.  Dyslipidemia.  On statin.  4.  Hypothyroidism .  Home dose Synthroid.  5.  Hypertension.  Continue home dose beta-blocker.  6.  CAD.  Chest pain-free on combination of aspirin beta-blocker and statin for secondary prevention, aspirin on hold for 10 more days due to GI bleed.  7.  Smoking.  Counseled to quit.   8. DM type II.    Follow home regimen and follow with PCP for glycemic control and A1c monitoring.   Discharge diagnosis     Principal Problem:   Acute upper GI bleeding Active Problems:   CAD in native artery   Current every day smoker   Type 2 diabetes mellitus with chronic kidney disease, without long-term current use of insulin (HCC)   Mixed hyperlipidemia   Acute kidney injury superimposed on CKD [facility])   Kidney transplant status, cadaveric   Essential hypertension    Discharge instructions    Discharge Instructions    Discharge instructions   Complete by: As directed    Follow with Primary MD Ivy Lynn, NP and your nephrologist in 7 days   Get CBC, CMP  checked next visit within 1 week by Primary MD   Activity: As tolerated with Full fall precautions use walker/cane & assistance as needed  Disposition Home    Diet: Heart Healthy  Low Carb  Special Instructions: If you have smoked or chewed Tobacco  in the last 2 yrs please stop smoking,  stop any regular Alcohol  and or any Recreational drug use.  On your next visit with your primary care physician please Get Medicines reviewed and adjusted.  Please request your Prim.MD to go over all Hospital Tests and Procedure/Radiological results at the follow up, please get all Hospital records sent to your Prim MD by signing hospital release before you go home.  If you experience worsening of your admission symptoms, develop shortness of breath, life threatening emergency, suicidal or homicidal thoughts you must seek medical attention immediately by calling 911 or calling your MD immediately  if symptoms less severe.  You Must read complete instructions/literature along with all the possible adverse reactions/side effects for all the Medicines you take and that have been prescribed to you. Take any new Medicines after you have completely understood and accpet all the possible adverse reactions/side effects.   Increase activity slowly   Complete by: As directed       Discharge Medications   Allergies as of 12/27/2019   No Known Allergies     Medication List    TAKE these medications   aspirin 81 MG EC tablet Take 1 tablet (81 mg total) by mouth daily. Start taking on: January 04, 2020 What changed: These instructions start on January 04, 2020. If you are unsure what to do until then, ask your doctor or other care provider.   atorvastatin 20 MG tablet Commonly known as: LIPITOR Take 1 tablet (20 mg total) by mouth daily. What changed: how much to take   azaTHIOprine 50 MG tablet Commonly known as: IMURAN Take 125 mg by mouth daily. Taking 2.5 tablets   B-D UF III MINI PEN NEEDLES 31G X 5 MM Misc Generic drug: Insulin Pen Needle USE WITH LANTUS AS DIRECTED   Lantus SoloStar 100 UNIT/ML Solostar Pen Generic drug: insulin glargine Inject 40 Units into the skin at bedtime.   levothyroxine 50 MCG tablet Commonly known as: SYNTHROID Take 50 mcg by mouth daily.   metoprolol  tartrate 25 MG tablet Commonly known as: LOPRESSOR Take 12.5 mg by mouth 2 (two) times daily.   pantoprazole 40 MG tablet Commonly known as: Protonix Take 1 tablet (40 mg total) by mouth 2 (two) times daily.   predniSONE 5 MG tablet Commonly known as: DELTASONE Take 5 mg by mouth every other day.  sodium bicarbonate 650 MG tablet Take 650 mg by mouth 2 (two) times daily.   tamsulosin 0.4 MG Caps capsule Commonly known as: Flomax Take 1 capsule (0.4 mg total) by mouth daily after supper.   Vitamin D (Ergocalciferol) 1.25 MG (50000 UNIT) Caps capsule Commonly known as: DRISDOL Take 50,000 Units by mouth once a week.       Follow-up Information    Ivy Lynn, NP. Schedule an appointment as soon as possible for a visit in 1 week(s).   Specialty: Nurse Practitioner Why: and your nephrologist in 1 week Contact information: [place] [address] [phone]        [name], MD. Schedule an appointment as soon as possible for a visit in 1 week(s).   Specialty: Gastroenterology Contact information: [address] [place] [address] [phone]        Alexis Frock, MD. Schedule an appointment as soon as possible for a visit in 1 week(s).   Specialty: Urology Contact information: [place] [address] [phone]           Major procedures and Radiology Reports - PLEASE review detailed and final reports thoroughly  -       US RENAL  Result Date: 12/23/2019 CLINICAL DATA:  Acute kidney injury history of bilateral renal transplants EXAM: RENAL / URINARY TRACT ULTRASOUND COMPLETE COMPARISON:  03/23/2018 FINDINGS: Right Kidney: Native right renal measurements: 8.9 x 6.1 x 5.3 cm. = volume: 145 mL Increased cortical echogenicity with decreased corticomedullary differentiation. Diffuse cortical thinning. No hydronephrosis. Cyst at the lower pole measuring 2 x 1.6 x 2.4 cm. Right lower quadrant renal  transplant measurements: 12.4 x 6.4 x 6.8 cm = volume: 280.1 mL. Cortical echogenicity is normal. No hydronephrosis. No perinephric fluid collections. Left Kidney: Native left renal measurements: 9.2 by 4.9 x 5.1 cm = volume: 119 mL. Increased cortical echogenicity with decreased corticomedullary differentiation. Diffuse cortical thinning. Central anechoic area either representing parapelvic cyst or mild hydronephrosis. Targeted ultrasound of the left lower quadrant was performed. The left lower quadrant transplant kidney could not be identified. Bladder: Appears normal for degree of bladder distention. Other: None. IMPRESSION: 1. Status post bilateral lower quadrant renal transplants. The right lower quadrant renal transplant is within normal limits with no evidence for hydronephrosis or perinephric fluid collection. The left lower quadrant transplant could not be visualized. 2. Native kidneys are echogenic and atrophic. There is a cyst in the native right kidney. There is a parapelvic cysts or mild hydronephrosis of the left native kidney. Electronically Signed   By: Donavan Foil M.D.   On: 12/23/2019 18:19   DG Chest Port 1 View  Result Date: 12/22/2019 CLINICAL DATA:  Weakness, shortness of breath EXAM: PORTABLE CHEST 1 VIEW COMPARISON:  None. FINDINGS: Prior CABG. Cardiomegaly. Low lung volumes. Bibasilar scarring or atelectasis. No effusions or acute bony abnormality. IMPRESSION: Cardiomegaly.  Bibasilar scarring or atelectasis. Electronically Signed   By: Rolm Baptise M.D.   On: 12/22/2019 14:33    Micro Results     Recent Results (from the past 240 hour(s))  SARS CORONAVIRUS 2 (TAT 6-24 HRS) Nasopharyngeal Nasopharyngeal Swab     Status: None   Collection Time: 12/22/19  4:40 PM   Specimen: Nasopharyngeal Swab  Result Value Ref Range Status   SARS Coronavirus 2 NEGATIVE NEGATIVE Final    Comment: (NOTE) SARS-CoV-2 target nucleic acids are NOT DETECTED. The SARS-CoV-2 RNA is generally  detectable in upper  and lower respiratory specimens during the acute phase of infection. Negative results do not preclude SARS-CoV-2 infection, do not rule out co-infections with other pathogens, and should not be used as the sole basis for treatment or other patient management decisions. Negative results must be combined with clinical observations, patient history, and epidemiological information. The expected result is Negative. Fact Sheet for Patients: SugarRoll.be Fact Sheet for Healthcare Providers: https://www.woods-mathews.com/ This test is not yet approved or cleared by the [place] FDA and  has been authorized for detection and/or diagnosis of SARS-CoV-2 by FDA under an Emergency Use Authorization (EUA). This EUA will remain  in effect (meaning this test can be used) for the duration of the COVID-19 declaration under Section 56 4(b)(1) of the Act, 21 U.S.C. section 360bbb-3(b)(1), unless the authorization is terminated or revoked sooner. Performed at [facility], [place] [address], [place], [place] [address]     Today   Subjective    Victor Schultz today has no headache,no chest abdominal pain,no new weakness tingling or numbness, feels much better wants to go home today.     Objective   Blood pressure (!) 148/62, pulse 90, temperature 98.2 F (36.8 C), temperature source Oral, resp. rate 16, height 5\' 4"  (1.626 m), weight 74.1 kg, SpO2 98 %.   Intake/Output Summary (Last 24 hours) at 12/27/2019 0904 Last data filed at 12/27/2019 0646 Gross per 24 hour  Intake 723 ml  Output 2200 ml  Net -1477 ml    Exam  Awake Alert, Oriented x 3, No new F.N deficits, Normal affect [place].AT,PERRAL Supple Neck,No JVD, No cervical lymphadenopathy appriciated.  Symmetrical Chest wall movement, Good air movement bilaterally, CTAB RRR,No Gallops,Rubs or new Murmurs, No Parasternal Heave +ve B.Sounds, Abd Soft, Non tender, No  organomegaly appriciated, No rebound -guarding or rigidity. No Cyanosis, Clubbing or edema, No new Rash or bruise   Data Review   CBC w Diff:  Lab Results  Component Value Date   WBC 5.3 12/27/2019   HGB 8.7 (L) 12/27/2019   HGB 11.1 (L) 12/06/2019   HCT 26.8 (L) 12/27/2019   HCT 33.1 (L) 12/06/2019   PLT 183 12/27/2019   PLT 272 12/06/2019   LYMPHOPCT 36 12/27/2019   MONOPCT 13 12/27/2019   EOSPCT 2 12/27/2019   BASOPCT 0 12/27/2019    CMP:  Lab Results  Component Value Date   NA 141 12/27/2019   NA 138 12/08/2019   K 4.7 12/27/2019   CL 112 (H) 12/27/2019   CO2 20 (L) 12/27/2019   BUN 63 (H) 12/27/2019   BUN 36 (H) 12/08/2019   CREATININE 3.66 (H) 12/27/2019   PROT 5.0 (L) 12/27/2019   PROT 6.9 12/08/2019   ALBUMIN 1.7 (L) 12/27/2019   ALBUMIN 3.0 (L) 12/08/2019   BILITOT 0.7 12/27/2019   BILITOT 0.5 12/08/2019   ALKPHOS 53 12/27/2019   AST 35 12/27/2019   ALT 31 12/27/2019  .   Total Time in preparing paper work, data evaluation and todays exam - 28 minutes  Lala Lund M.D on 12/27/2019 at 9:04 AM  [facility]   Office  [phone]

## 2019-12-28 ENCOUNTER — Telehealth: Payer: Self-pay

## 2019-12-28 NOTE — Telephone Encounter (Signed)
Transition Care Management Follow-up Telephone Call  Jayren Cease 1951-04-17  Admit Date: 12/22/19 Discharge Date: 12/27/19 Diagnoses: Active upper GI bleed   2 day post discharge: 12/29/19 7 day post discharge: 01/03/20 14 day post discharge: 01/10/20  Jeren Dufrane was discharged from [facility] on 12/27/19 with the diagnoses listed above.  He was contacted today via telephone in regards to transition of care.    -While admitted he was given one unit of packed RBC on 3/18 then again on 3/19.  His HGB on discharge was 8.7. -Monitor kidney function -EGD done 12/23/19 showing nonbleeding esophageal ulcer--patient is to follow-up with GI -Taking Pantoprazole 40mg  BID  Discharge Instructions:  -Hold aspirin x 10 days -Recheck CBC & CMP    Items Reviewed:  Medications reviewed: yes  Allergies reviewed: yes  Dietary changes reviewed: yes  Referrals reviewed: yes   Functional Questionnaire:    Any patient concerns? no   Confirmed importance and date/time of follow-up visits scheduled yes  Provider Appointment booked with Je March 29  Confirmed with patient if condition begins to worsen call PCP or go to the ER.  Patient was given the office number and encouraged to call back with question or concerns.  : yes

## 2019-12-30 ENCOUNTER — Other Ambulatory Visit: Payer: Self-pay

## 2019-12-30 NOTE — Patient Outreach (Signed)
Red Emmi: Alert reason:  Questions about discharge: yes  Placed call to patient and reviewed reason for call.  Patient reports he was discharged home with catheter and wants to know when he can have it taken out.  Reviewed discharge plan with patient and encouraged patient to call his nephrologist as directed.   Patient reports he has follow up scheduled with primary MD.  Patient denied any bleeding since discharge. Reviewed importance of patient taking his protonix and avoiding aspirin as directed.   PLAN: no additional questions noted.   Tomasa Rand, RN, BSN, CEN [facility] [employer] [phone]

## 2020-01-02 ENCOUNTER — Encounter: Payer: Self-pay | Admitting: Nurse Practitioner

## 2020-01-02 DIAGNOSIS — N184 Chronic kidney disease, stage 4 (severe): Secondary | ICD-10-CM | POA: Insufficient documentation

## 2020-01-02 DIAGNOSIS — I1310 Hypertensive heart and chronic kidney disease without heart failure, with stage 1 through stage 4 chronic kidney disease, or unspecified chronic kidney disease: Secondary | ICD-10-CM

## 2020-01-02 DIAGNOSIS — R10816 Epigastric abdominal tenderness: Secondary | ICD-10-CM | POA: Insufficient documentation

## 2020-01-02 HISTORY — DX: Chronic kidney disease, stage 4 (severe): N18.4

## 2020-01-02 HISTORY — DX: Epigastric abdominal tenderness: R10.816

## 2020-01-02 HISTORY — DX: Hypertensive heart and chronic kidney disease without heart failure, with stage 1 through stage 4 chronic kidney disease, or unspecified chronic kidney disease: I13.10

## 2020-01-02 LAB — POCT UA - GLUCOSE/PROTEIN

## 2020-01-03 ENCOUNTER — Other Ambulatory Visit: Payer: Self-pay

## 2020-01-03 ENCOUNTER — Ambulatory Visit (INDEPENDENT_AMBULATORY_CARE_PROVIDER_SITE_OTHER): Payer: Medicare Other | Admitting: Legal Medicine

## 2020-01-03 ENCOUNTER — Encounter: Payer: Self-pay | Admitting: Legal Medicine

## 2020-01-03 VITALS — BP 154/70 | HR 96 | Temp 98.0°F | Resp 16 | Ht 64.0 in | Wt 169.0 lb

## 2020-01-03 DIAGNOSIS — K922 Gastrointestinal hemorrhage, unspecified: Secondary | ICD-10-CM

## 2020-01-03 DIAGNOSIS — E1169 Type 2 diabetes mellitus with other specified complication: Secondary | ICD-10-CM

## 2020-01-03 DIAGNOSIS — N189 Chronic kidney disease, unspecified: Secondary | ICD-10-CM

## 2020-01-03 DIAGNOSIS — N179 Acute kidney failure, unspecified: Secondary | ICD-10-CM | POA: Diagnosis not present

## 2020-01-03 LAB — CBC WITH DIFFERENTIAL/PLATELET
Basophils Absolute: 0 10*3/uL (ref 0.0–0.2)
Basos: 1 %
EOS (ABSOLUTE): 0.1 10*3/uL (ref 0.0–0.4)
Eos: 1 %
Hematocrit: 29.7 % — ABNORMAL LOW (ref 37.5–51.0)
Hemoglobin: 10 g/dL — ABNORMAL LOW (ref 13.0–17.7)
Immature Grans (Abs): 0.1 10*3/uL (ref 0.0–0.1)
Immature Granulocytes: 1 %
Lymphocytes Absolute: 1.3 10*3/uL (ref 0.7–3.1)
Lymphs: 15 %
MCH: 33.9 pg — ABNORMAL HIGH (ref 26.6–33.0)
MCHC: 33.7 g/dL (ref 31.5–35.7)
MCV: 101 fL — ABNORMAL HIGH (ref 79–97)
Monocytes Absolute: 0.6 10*3/uL (ref 0.1–0.9)
Monocytes: 7 %
Neutrophils Absolute: 6.7 10*3/uL (ref 1.4–7.0)
Neutrophils: 75 %
Platelets: 287 10*3/uL (ref 150–450)
RBC: 2.95 x10E6/uL — ABNORMAL LOW (ref 4.14–5.80)
RDW: 14.8 % (ref 11.6–15.4)
WBC: 8.8 10*3/uL (ref 3.4–10.8)

## 2020-01-03 LAB — BASIC METABOLIC PANEL
BUN/Creatinine Ratio: 11 (ref 10–24)
BUN: 36 mg/dL — ABNORMAL HIGH (ref 8–27)
CO2: 19 mmol/L — ABNORMAL LOW (ref 20–29)
Calcium: 8.5 mg/dL — ABNORMAL LOW (ref 8.6–10.2)
Chloride: 110 mmol/L — ABNORMAL HIGH (ref 96–106)
Creatinine, Ser: 3.19 mg/dL — ABNORMAL HIGH (ref 0.76–1.27)
GFR calc Af Amer: 22 mL/min/{1.73_m2} — ABNORMAL LOW (ref >59)
GFR calc non Af Amer: 19 mL/min/{1.73_m2} — ABNORMAL LOW (ref >59)
Glucose: 235 mg/dL — ABNORMAL HIGH (ref 65–99)
Potassium: 5.6 mmol/L — ABNORMAL HIGH (ref 3.5–5.2)
Sodium: 141 mmol/L (ref 134–144)

## 2020-01-03 MED ORDER — LEVOTHYROXINE SODIUM 50 MCG PO TABS: 50.0000 ug | ORAL_TABLET | Freq: Every day | ORAL | 0 refills | Status: DC

## 2020-01-03 MED ORDER — ATORVASTATIN CALCIUM 10 MG PO TABS: 10.0000 mg | ORAL_TABLET | Freq: Every day | ORAL | 0 refills | Status: DC

## 2020-01-03 MED ORDER — METOPROLOL TARTRATE 25 MG PO TABS: 12.5000 mg | ORAL_TABLET | Freq: Two times a day (BID) | ORAL | 0 refills | Status: DC

## 2020-01-03 NOTE — Progress Notes (Signed)
Established Patient Office Visit  Subjective:  Patient ID: Victor Schultz, male    DOB: 10-06-1951  Age: 69 y.o. MRN: [id number]  CC:  Chief Complaint  Patient presents with  . Transitions Of Care    [place] from 12/22/2019 to 12/27/2019  . Acute upper gastrointestinal bleeding  TOC with reconciliation of medicines.  HPI Victor Schultz presents for TOC.  He was admitted at [facility] on 12/22/2019 for Upper gi blees, transfused 2 units PRCS and feeling well.  He had EGD showing esophageal ulcer.  He put on pantoprazole and doing well. No melena.  Past Medical History:  Diagnosis Date  . Atherosclerotic heart disease of native coronary artery with unstable angina pectoris ([place])   . Atrophy of thyroid (acquired)   . History of end stage renal disease   . Hypertensive heart and chronic kidney disease without heart failure, with stage 5 chronic kidney disease, or end stage renal disease ([place])   . Kidney transplant status   . Long term (current) use of insulin ([place])   . Mixed hyperlipidemia   . Type 2 diabetes mellitus with diabetic chronic kidney disease [facility])     Past Surgical History:  Procedure Laterality Date  . CATARACT EXTRACTION    . CORONARY ARTERY BYPASS GRAFT    . ESOPHAGOGASTRODUODENOSCOPY (EGD) WITH PROPOFOL Left 12/23/2019   Procedure: ESOPHAGOGASTRODUODENOSCOPY (EGD) WITH PROPOFOL;  Surgeon: [name], MD;  Location: [place];  Service: Endoscopy;  Laterality: Left;  . KIDNEY TRANSPLANT     1974, 1982    Family History  Problem Relation Age of Onset  . Liver disease Mother   . Alzheimer's disease Father     Social History   Socioeconomic History  . Marital status: Divorced    Spouse name: Not on file  . Number of children: 1  . Years of education: Not on file  . Highest education level: Not on file  Occupational History  . Occupation: retired  Tobacco Use  . Smoking status: Current Every Day Smoker    Packs/day: 0.50    Years: 39.00     Pack years: 19.50    Types: Cigarettes  . Smokeless tobacco: Never Used  Substance and Sexual Activity  . Alcohol use: Never  . Drug use: Never  . Sexual activity: Not Currently  Other Topics Concern  . Not on file  Social History Narrative  . Not on file   Social Determinants of Health   Financial Resource Strain:   . Difficulty of Paying Living Expenses:   Food Insecurity:   . Worried About Charity fundraiser in the Last Year:   . Arboriculturist in the Last Year:   Transportation Needs:   . Film/video editor (Medical):   [name] Lack of Transportation (Non-Medical):   Physical Activity:   . Days of Exercise per Week:   . Minutes of Exercise per Session:   Stress:   . Feeling of Stress :   Social Connections:   . Frequency of Communication with Friends and Family:   . Frequency of Social Gatherings with Friends and Family:   . Attends Religious Services:   . Active Member of Clubs or Organizations:   . Attends Archivist Meetings:   [name] Marital Status:   Intimate Partner Violence:   . Fear of Current or Ex-Partner:   . Emotionally Abused:   [name] Physically Abused:   . Sexually Abused:     Outpatient Medications Prior to  Visit  Medication Sig Dispense Refill  . [START ON 01/04/2020] aspirin 81 MG EC tablet Take 1 tablet (81 mg total) by mouth daily. 30 tablet 12  . azaTHIOprine (IMURAN) 50 MG tablet Take 125 mg by mouth daily. Taking 2.5 tablets    . B-D UF III MINI PEN NEEDLES 31G X 5 MM MISC USE WITH LANTUS AS DIRECTED    . LANTUS SOLOSTAR 100 UNIT/ML Solostar Pen Inject 40 Units into the skin at bedtime.     . pantoprazole (PROTONIX) 40 MG tablet Take 1 tablet (40 mg total) by mouth 2 (two) times daily. 60 tablet 2  . predniSONE (DELTASONE) 5 MG tablet Take 5 mg by mouth every other day.    . sodium bicarbonate 650 MG tablet Take 650 mg by mouth 2 (two) times daily.     . tamsulosin (FLOMAX) 0.4 MG CAPS capsule Take 1 capsule (0.4 mg total) by mouth daily  after supper. 30 capsule 0  . Vitamin D, Ergocalciferol, (DRISDOL) 1.25 MG (50000 UNIT) CAPS capsule Take 50,000 Units by mouth once a week.    [name] atorvastatin (LIPITOR) 20 MG tablet Take 1 tablet (20 mg total) by mouth daily. (Patient taking differently: Take 10 mg by mouth daily. ) 90 tablet 3  . levothyroxine (SYNTHROID) 50 MCG tablet Take 50 mcg by mouth daily.    . metoprolol tartrate (LOPRESSOR) 25 MG tablet Take 12.5 mg by mouth 2 (two) times daily.     No facility-administered medications prior to visit.    No Known Allergies  ROS Review of Systems  Constitutional: Negative.   HENT: Negative.   Eyes: Negative.   Respiratory: Negative.   Cardiovascular: Negative.   Gastrointestinal: Negative.   Endocrine: Negative.   Genitourinary: Negative.   Musculoskeletal: Negative.   Skin: Negative.   Neurological: Negative.   Psychiatric/Behavioral: Negative.       Objective:    Physical Exam  Constitutional: He is oriented to person, place, and time. He appears well-developed and well-nourished.  HENT:  Head: Normocephalic and atraumatic.  Eyes: Pupils are equal, round, and reactive to light. Conjunctivae and EOM are normal.  Cardiovascular: Normal rate, regular rhythm and normal heart sounds.  Pulmonary/Chest: Effort normal and breath sounds normal.  Abdominal: Soft. Bowel sounds are normal.  Musculoskeletal:        General: Normal range of motion.     Cervical back: Normal range of motion and neck supple.  Neurological: He is alert and oriented to person, place, and time. He has normal reflexes.  Skin: Skin is warm.  Psychiatric: He has a normal mood and affect.  Vitals reviewed.   BP (!) 154/70 (BP Location: Right Arm, Patient Position: Sitting)   Pulse 96   Temp 98 F (36.7 C)   Resp 16   Ht 5\' 4"  (1.626 m)   Wt 169 lb (76.7 kg)   SpO2 97%   BMI 29.01 kg/m  Wt Readings from Last 3 Encounters:  01/03/20 169 lb (76.7 kg)  12/26/19 163 lb 5.8 oz (74.1 kg)   12/06/19 166 lb (75.3 kg)     Health Maintenance Due  Topic Date Due  . Hepatitis C Screening  Never done  . FOOT EXAM  Never done  . OPHTHALMOLOGY EXAM  Never done  . TETANUS/TDAP  Never done  . COLONOSCOPY  Never done  . PNA vac Low Risk Adult (1 of 2 - PCV13) Never done  . INFLUENZA VACCINE  05/08/2019    There are no  preventive care reminders to display for this patient.  Lab Results  Component Value Date   TSH 3.310 12/06/2019   Lab Results  Component Value Date   WBC 5.3 12/27/2019   HGB 8.7 (L) 12/27/2019   HCT 26.8 (L) 12/27/2019   MCV 100.8 (H) 12/27/2019   PLT 183 12/27/2019   Lab Results  Component Value Date   NA 141 12/27/2019   K 4.7 12/27/2019   CO2 20 (L) 12/27/2019   GLUCOSE 90 12/27/2019   BUN 63 (H) 12/27/2019   CREATININE 3.66 (H) 12/27/2019   BILITOT 0.7 12/27/2019   ALKPHOS 53 12/27/2019   AST 35 12/27/2019   ALT 31 12/27/2019   PROT 5.0 (L) 12/27/2019   ALBUMIN 1.7 (L) 12/27/2019   CALCIUM 7.9 (L) 12/27/2019   ANIONGAP 9 12/27/2019   Lab Results  Component Value Date   CHOL 196 12/06/2019   Lab Results  Component Value Date   HDL 46 12/06/2019   Lab Results  Component Value Date   LDLCALC 106 (H) 12/06/2019   Lab Results  Component Value Date   TRIG 258 (H) 12/06/2019   Lab Results  Component Value Date   CHOLHDL 4.3 12/06/2019   Lab Results  Component Value Date   HGBA1C 6.5 (H) 12/06/2019      Assessment & Plan:   Problem List Items Addressed This Visit      Digestive   Acute upper GI bleeding - Primary    He has no further bleeding on medicines.  He will see GI this week for follow up. He is to follow up at routine chronic visit.  CBC and CMP drawn.      Relevant Orders   Basic Metabolic Panel   CBC with Differential     Genitourinary   Acute kidney injury superimposed on CKD [facility])    Patient had rising creatinine levels with GI bleed.  He needs repeat to see if they are resolving.      Relevant  Orders   Basic Metabolic Panel      No orders of the defined types were placed in this encounter.   Follow-up: Return in about 1 month (around 02/03/2020).    Reinaldo Meeker, MD

## 2020-01-03 NOTE — Assessment & Plan Note (Signed)
He has no further bleeding on medicines.  He will see GI this week for follow up. He is to follow up at routine chronic visit.  CBC and CMP drawn.

## 2020-01-03 NOTE — Assessment & Plan Note (Signed)
Patient had rising creatinine levels with GI bleed.  He needs repeat to see if they are resolving.

## 2020-01-04 ENCOUNTER — Other Ambulatory Visit: Payer: Self-pay

## 2020-01-04 ENCOUNTER — Other Ambulatory Visit: Payer: Self-pay | Admitting: Legal Medicine

## 2020-01-04 DIAGNOSIS — E875 Hyperkalemia: Secondary | ICD-10-CM

## 2020-01-04 NOTE — Progress Notes (Signed)
Bun and creatninine are lower, potassium high at 5.6 repeat BMP in one week, hemoglobin increased to 10.0 lp

## 2020-01-05 ENCOUNTER — Telehealth: Payer: Self-pay | Admitting: Nurse Practitioner

## 2020-01-05 NOTE — Progress Notes (Signed)
  Chronic Care Management   Note  01/05/2020 Name: Bentlie Catanzaro MRN: [id number] DOB: [birth year]  Sayge Salvato is a 69 y.o. year old male who is a primary care patient of Ivy Lynn, NP. I reached out to [name] by phone today in response to a referral sent by Mr. Jaziah Nidiffer's PCP, Ivy Lynn, NP.   Mr. Tippen was given information about Chronic Care Management services today including:  1. CCM service includes personalized support from designated clinical staff supervised by his physician, including individualized plan of care and coordination with other care providers 2. 24/7 contact phone numbers for assistance for urgent and routine care needs. 3. Service will only be billed when office clinical staff spend 20 minutes or more in a month to coordinate care. 4. Only one practitioner may furnish and bill the service in a calendar month. 5. The patient may stop CCM services at any time (effective at the end of the month) by phone call to the office staff.   Patient agreed to services and verbal consent obtained.   Follow up plan:   Earney Hamburg Upstream Scheduler

## 2020-01-10 DIAGNOSIS — Z23 Encounter for immunization: Secondary | ICD-10-CM | POA: Diagnosis not present

## 2020-01-11 ENCOUNTER — Other Ambulatory Visit: Payer: Self-pay

## 2020-01-11 ENCOUNTER — Other Ambulatory Visit: Payer: Medicare Other

## 2020-01-11 DIAGNOSIS — R809 Proteinuria, unspecified: Secondary | ICD-10-CM | POA: Diagnosis not present

## 2020-01-11 DIAGNOSIS — E875 Hyperkalemia: Secondary | ICD-10-CM | POA: Diagnosis not present

## 2020-01-11 DIAGNOSIS — E1169 Type 2 diabetes mellitus with other specified complication: Secondary | ICD-10-CM | POA: Diagnosis not present

## 2020-01-11 DIAGNOSIS — R309 Painful micturition, unspecified: Secondary | ICD-10-CM | POA: Diagnosis not present

## 2020-01-11 DIAGNOSIS — Z940 Kidney transplant status: Secondary | ICD-10-CM | POA: Diagnosis not present

## 2020-01-11 DIAGNOSIS — I10 Essential (primary) hypertension: Secondary | ICD-10-CM | POA: Diagnosis not present

## 2020-01-11 DIAGNOSIS — N186 End stage renal disease: Secondary | ICD-10-CM | POA: Diagnosis not present

## 2020-01-11 DIAGNOSIS — N250 Renal osteodystrophy: Secondary | ICD-10-CM | POA: Diagnosis not present

## 2020-01-11 DIAGNOSIS — E872 Acidosis: Secondary | ICD-10-CM | POA: Diagnosis not present

## 2020-01-11 DIAGNOSIS — E785 Hyperlipidemia, unspecified: Secondary | ICD-10-CM | POA: Diagnosis not present

## 2020-01-11 NOTE — Chronic Care Management (AMB) (Signed)
Chronic Care Management Pharmacy  Name: Victor Schultz  MRN: [id number] DOB: December 28, 1950  Chief Complaint/ HPI  Victor Schultz,  69 y.o. , male presents for their Initial CCM visit with the clinical pharmacist via telephone due to COVID-19 Pandemic.  PCP : Ivy Lynn, NP  Their chronic conditions include: CAD, HTN, GERD, DM2, CKD, HLD.  Office Visits: 01/03/2020 - Transition of care visit. Acute GI bleed. Scr elevated.   12/06/2019 - Seen for chronic visit. Complaints of acid reflux. Patient has been on pantoprazole x21month. Lifestyle modifications.    Consult Visit: 12/23/2019 - EGD performed 12/22/2019 - admitted to hospital for vomiting blood and blood in stools. Given abx, blood transfusion and pantoprazole.  11/01/2019 - Endocrinology visit 07/14/2019 - Nephrology visit.   Medications: Outpatient Encounter Medications as of 01/12/2020  Medication Sig Note  . aspirin 81 MG EC tablet Take 1 tablet (81 mg total) by mouth daily.   [name] atorvastatin (LIPITOR) 10 MG tablet Take 1 tablet (10 mg total) by mouth daily. (Patient taking differently: Take 5 mg by mouth daily. )   . azaTHIOprine (IMURAN) 50 MG tablet Take 125 mg by mouth daily. Taking 1.5 tablets in the am and 1 tablets in the evening.   . B-D UF III MINI PEN NEEDLES 31G X 5 MM MISC USE WITH LANTUS AS DIRECTED   . LANTUS SOLOSTAR 100 UNIT/ML Solostar Pen Inject 40 Units into the skin at bedtime.    [name] levothyroxine (SYNTHROID) 50 MCG tablet Take 1 tablet (50 mcg total) by mouth daily.   . metoprolol tartrate (LOPRESSOR) 25 MG tablet Take 0.5 tablets (12.5 mg total) by mouth 2 (two) times daily. (Patient taking differently: Take 25 mg by mouth 2 (two) times daily. )   . pantoprazole (PROTONIX) 40 MG tablet Take 1 tablet (40 mg total) by mouth 2 (two) times daily.   . predniSONE (DELTASONE) 5 MG tablet Take 5 mg by mouth every other day.   . sodium bicarbonate 650 MG tablet Take 1,300 mg by mouth 2 (two) times daily.    .  tamsulosin (FLOMAX) 0.4 MG CAPS capsule Take 1 capsule (0.4 mg total) by mouth daily after supper.   . Vitamin D, Ergocalciferol, (DRISDOL) 1.25 MG (50000 UNIT) CAPS capsule Take 50,000 Units by mouth once a week. 12/22/2019: Sunday   No facility-administered encounter medications on file as of 01/12/2020.     Current Diagnosis/Assessment:  Goals Addressed            This Visit's Progress   . Pharmacy Care Plan       CARE PLAN ENTRY  Current Barriers:  . Chronic Disease Management support, education, and care coordination needs related to HTN, HLD, and DMII  Pharmacist Clinical Goal(s):  . Ensure safety, efficacy, and affordability of medications . Recommend maintaining BP <140/90 mmHg . Goal Cholesterol: TC <200, LDL <100, TG <150 HDL >50 mg/dL . Goal Hemoglobin A1C <7%.    Interventions: . Comprehensive medication review performed.   Patient Self Care Activities:  . Recommend engaging in 30 minutes of moderate intensity exercise 5 times each week.  . Recommend following the Dash diet guidance of incorporating fruits, vegetables, whole-grains, low-fat dairy products, lean meats, legumes and nuts for optimal health. . Recommend keeping sodium intake less than 1500 mg/day.  . Recommend receiving annual flu shot each Fall. . Recommend a Pneumonia shot at next visit with Dr. Henrene Pastor.  Initial goal documentation        Diabetes  Recent Relevant Labs: Lab Results  Component Value Date/Time   HGBA1C 6.5 (H) 12/06/2019 09:17 AM   MICROALBUR 150 12/06/2019 09:03 AM     Checking BG: Never   Patient has failed these meds in past: none listed Patient is currently controlled on the following medications:lantus solostar, bd pen needles  We discussed: smoking cessation and diet. Patient is limited on diet due to low potassium diet per Nephrology. Patient says he tries to do well and watch sugar. He does not check blood sugar but knows when it is low and how to treat.    Plan  Continue current medications,  Hypertension   BP today is:  <140/90  Office blood pressures are  BP Readings from Last 3 Encounters:  01/03/20 (!) 154/70  12/27/19 (!) 148/62  12/06/19 122/62    Patient has failed these meds in the past: none listed Patient is currently controlled on the following medications: metoprolol tartrate 25 mg bid  Patient checks BP at home: never  Patient home BP readings are ranging: have always been good. Says they have increased since recent GI bleed. His metoprolol has been increased. Patient is still weak and low HGB after 3 units of blood.   We discussed smoking cessation and the improvements he has made by decreasing to 1/2 pack/day. Patient will let PharmD know if he wants medication support to stop smoking.   Plan  Continue current medications   Hyperlipidemia/CAD   Lipid Panel     Component Value Date/Time   CHOL 196 12/06/2019 0917   TRIG 258 (H) 12/06/2019 0917   HDL 46 12/06/2019 0917   CHOLHDL 4.3 12/06/2019 0917   LDLCALC 106 (H) 12/06/2019 0917   LABVLDL 44 (H) 12/06/2019 0917     The 10-year ASCVD risk score Mikey Bussing DC Jr., et al., 2013) is: 53.4%   Values used to calculate the score:     Age: 28 years     Sex: Male     Is Non-Hispanic African American: No     Diabetic: Yes     Tobacco smoker: Yes     Systolic Blood Pressure: 793 mmHg     Is BP treated: Yes     HDL Cholesterol: 46 mg/dL     Total Cholesterol: 196 mg/dL   Patient has failed these meds in past: vascepa Patient is currently controlled on the following medications: aspirin ec 81 mg, atorvastatin 10 mg  We discussed:  diet and exercise extensively. Patient is still weak from blood loss. He is working to build up stamina by walking around his house and walking to his mailbox.  Plan  Continue current medications     and  GERD/GI bleed/Ulcer    Patient has failed these meds in past: Tums Patient is currently controlled on the following  medications:  pantoprazole 40 mg bid  We discussed:  Continuing current regimen. Following up with GI doctor. Patient denies any additional aspirin or NSAID use that could have contributed. Patient does not drink alcohol.   Plan  Continue current medications   Hypothyroidism   TSH  Date Value Ref Range Status  12/06/2019 3.310 0.450 - 4.500 uIU/mL Final     Patient has failed these meds in past: n/a Patient is currently controlled on the following medications: levothyroxine  We discussed:  Continue current medications and the proper administration of levothyroxine 30 mins before food and all other meds.   Plan  Continue current medications   S/P Kidney transplant/CKD stage 4  Lab Results  Component Value Date   CREATININE 3.20 (H) 01/11/2020   CREATININE 3.19 (H) 01/03/2020   CREATININE 3.66 (H) 12/27/2019    Patient has failed these meds in past: none  Patient is currently controlled on the following medications: Azathiprine 50 mg tablet, Prednisone 5, tamsulosin   We discussed:  Following up with Nephrology as recommended. Continuing to monitor diet as directed. Avoidign potatoes and potassium rich foods. We educated on why and what he takes for anti-rejection.   Plan  Continue current medications    Vaccines   Reviewed and discussed patient's vaccination history. Has completed both COVID vaccines.   Immunization History  Administered Date(s) Administered  . Influenza-Unspecified 10/07/2018    Plan  Recommended patient receive Pneumonia vaccine in office at next visit.   Health Maintenance   Patient is currently controlled on the following medications:  Vitamin D 50,000 unit - low Vitamin D Sodium Bicarbonate - metabolic acidosis  We discussed:  Patient says that he does not take any OTC without approval from his doctor due to kidneys.   Plan  Continue current medications  Medication Management   Pt uses [facility] for all  medications Does use pill box to keep organized.  Pt endorses good compliance - may have missed 5 evening med doses in his life.   We discussed: continuing good medication compliance.   Plan  Patient will reach out with any questions or concerns.      Follow up: 3 months.

## 2020-01-12 ENCOUNTER — Ambulatory Visit: Payer: Medicare Other

## 2020-01-12 ENCOUNTER — Other Ambulatory Visit: Payer: Self-pay

## 2020-01-12 ENCOUNTER — Other Ambulatory Visit: Payer: Self-pay | Admitting: Legal Medicine

## 2020-01-12 DIAGNOSIS — E782 Mixed hyperlipidemia: Secondary | ICD-10-CM

## 2020-01-12 DIAGNOSIS — I10 Essential (primary) hypertension: Secondary | ICD-10-CM

## 2020-01-12 DIAGNOSIS — E1122 Type 2 diabetes mellitus with diabetic chronic kidney disease: Secondary | ICD-10-CM

## 2020-01-12 LAB — COMPREHENSIVE METABOLIC PANEL
ALT: 24 IU/L (ref 0–44)
AST: 24 IU/L (ref 0–40)
Albumin/Globulin Ratio: 0.7 — ABNORMAL LOW (ref 1.2–2.2)
Albumin: 2.6 g/dL — ABNORMAL LOW (ref 3.8–4.8)
Alkaline Phosphatase: 89 IU/L (ref 39–117)
BUN/Creatinine Ratio: 13 (ref 10–24)
BUN: 40 mg/dL — ABNORMAL HIGH (ref 8–27)
Bilirubin Total: 0.5 mg/dL (ref 0.0–1.2)
CO2: 18 mmol/L — ABNORMAL LOW (ref 20–29)
Calcium: 8.3 mg/dL — ABNORMAL LOW (ref 8.6–10.2)
Chloride: 111 mmol/L — ABNORMAL HIGH (ref 96–106)
Creatinine, Ser: 3.2 mg/dL — ABNORMAL HIGH (ref 0.76–1.27)
GFR calc Af Amer: 22 mL/min/{1.73_m2} — ABNORMAL LOW (ref >59)
GFR calc non Af Amer: 19 mL/min/{1.73_m2} — ABNORMAL LOW (ref >59)
Globulin, Total: 3.7 g/dL (ref 1.5–4.5)
Glucose: 198 mg/dL — ABNORMAL HIGH (ref 65–99)
Potassium: 5.2 mmol/L (ref 3.5–5.2)
Sodium: 143 mmol/L (ref 134–144)
Total Protein: 6.3 g/dL (ref 6.0–8.5)

## 2020-01-12 NOTE — Progress Notes (Signed)
Glucose 198, kidney tests no changes, low albumin- take protein/calorie supplement to boost nutrition. Liver test normal, potassium normal lp

## 2020-01-12 NOTE — Patient Instructions (Signed)
Visit Information  Thank you for your time discussing your medications. I look forward to working with you to achieve your health care goals. Below is a summary of what we talked about during our visit.   Goals Addressed            This Visit's Progress   . Pharmacy Care Plan       CARE PLAN ENTRY  Current Barriers:  . Chronic Disease Management support, education, and care coordination needs related to HTN, HLD, and DMII  Pharmacist Clinical Goal(s):  . Ensure safety, efficacy, and affordability of medications . Recommend maintaining BP <140/90 mmHg . Goal Cholesterol: TC <200, LDL <100, TG <150 HDL >50 mg/dL . Goal Hemoglobin A1C <7%.    Interventions: . Comprehensive medication review performed.   Patient Self Care Activities:  . Recommend engaging in 30 minutes of moderate intensity exercise 5 times each week.  . Recommend following the Dash diet guidance of incorporating fruits, vegetables, whole-grains, low-fat dairy products, lean meats, legumes and nuts for optimal health. . Recommend keeping sodium intake less than 1500 mg/day.  . Recommend receiving annual flu shot each Fall. . Recommend a Pneumonia shot at next visit with Dr. Henrene Pastor.  Initial goal documentation        Mr. Faul was given information about Chronic Care Management services today including:  1. CCM service includes personalized support from designated clinical staff supervised by his physician, including individualized plan of care and coordination with other care providers 2. 24/7 contact phone numbers for assistance for urgent and routine care needs. 3. Standard insurance, coinsurance, copays and deductibles apply for chronic care management only during months in which we provide at least 20 minutes of these services. Most insurances cover these services at 100%, however patients may be responsible for any copay, coinsurance and/or deductible if applicable. This service may help you avoid the need  for more expensive face-to-face services. 4. Only one practitioner may furnish and bill the service in a calendar month. 5. The patient may stop CCM services at any time (effective at the end of the month) by phone call to the office staff.  Patient agreed to services and verbal consent obtained.   Print copy of patient instructions provided.  Telephone follow up appointment with pharmacy team member scheduled for: 04/07/2020 at 46 am  Sherre Poot, PharmD Clinical Pharmacist [facility] [phone]  DASH Eating Plan DASH stands for "Dietary Approaches to Stop Hypertension." The DASH eating plan is a healthy eating plan that has been shown to reduce high blood pressure (hypertension). It may also reduce your risk for type 2 diabetes, heart disease, and stroke. The DASH eating plan may also help with weight loss. What are tips for following this plan?  General guidelines  Avoid eating more than 2,300 mg (milligrams) of salt (sodium) a day. If you have hypertension, you may need to reduce your sodium intake to 1,500 mg a day.  Limit alcohol intake to no more than 1 drink a day for nonpregnant women and 2 drinks a day for men. One drink equals 12 oz of beer, 5 oz of wine, or 1 oz of hard liquor.  Work with your health care provider to maintain a healthy body weight or to lose weight. Ask what an ideal weight is for you.  Get at least 30 minutes of exercise that causes your heart to beat faster (aerobic exercise) most days of the week. Activities may include walking, swimming, or biking.  Work with your health care provider or diet and nutrition specialist (dietitian) to adjust your eating plan to your individual calorie needs. Reading food labels   Check food labels for the amount of sodium per serving. Choose foods with less than 5 percent of the Daily Value of sodium. Generally, foods with less than 300 mg of sodium per serving fit into this eating plan.  To find whole  grains, look for the word "whole" as the first word in the ingredient list. Shopping  Buy products labeled as "low-sodium" or "no salt added."  Buy fresh foods. Avoid canned foods and premade or frozen meals. Cooking  Avoid adding salt when cooking. Use salt-free seasonings or herbs instead of table salt or sea salt. Check with your health care provider or pharmacist before using salt substitutes.  Do not fry foods. Cook foods using healthy methods such as baking, boiling, grilling, and broiling instead.  Cook with heart-healthy oils, such as olive, canola, soybean, or sunflower oil. Meal planning  Eat a balanced diet that includes: ? 5 or more servings of fruits and vegetables each day. At each meal, try to fill half of your plate with fruits and vegetables. ? Up to 6-8 servings of whole grains each day. ? Less than 6 oz of lean meat, poultry, or fish each day. A 3-oz serving of meat is about the same size as a deck of cards. One egg equals 1 oz. ? 2 servings of low-fat dairy each day. ? A serving of nuts, seeds, or beans 5 times each week. ? Heart-healthy fats. Healthy fats called Omega-3 fatty acids are found in foods such as flaxseeds and coldwater fish, like sardines, salmon, and mackerel.  Limit how much you eat of the following: ? Canned or prepackaged foods. ? Food that is high in trans fat, such as fried foods. ? Food that is high in saturated fat, such as fatty meat. ? Sweets, desserts, sugary drinks, and other foods with added sugar. ? Full-fat dairy products.  Do not salt foods before eating.  Try to eat at least 2 vegetarian meals each week.  Eat more home-cooked food and less restaurant, buffet, and fast food.  When eating at a restaurant, ask that your food be prepared with less salt or no salt, if possible. What foods are recommended? The items listed may not be a complete list. Talk with your dietitian about what dietary choices are best for  you. Grains Whole-grain or whole-wheat bread. Whole-grain or whole-wheat pasta. Gaylin Bulthuis rice. Modena Morrow. Bulgur. Whole-grain and low-sodium cereals. Pita bread. Low-fat, low-sodium crackers. Whole-wheat flour tortillas. Vegetables Fresh or frozen vegetables (raw, steamed, roasted, or grilled). Low-sodium or reduced-sodium tomato and vegetable juice. Low-sodium or reduced-sodium tomato sauce and tomato paste. Low-sodium or reduced-sodium canned vegetables. Fruits All fresh, dried, or frozen fruit. Canned fruit in natural juice (without added sugar). Meat and other protein foods Skinless chicken or [place]. Ground chicken or [place]. Pork with fat trimmed off. Fish and seafood. Egg whites. Dried beans, peas, or lentils. Unsalted nuts, nut butters, and seeds. Unsalted canned beans. Lean cuts of beef with fat trimmed off. Low-sodium, lean deli meat. Dairy Low-fat (1%) or fat-free (skim) milk. Fat-free, low-fat, or reduced-fat cheeses. Nonfat, low-sodium ricotta or cottage cheese. Low-fat or nonfat yogurt. Low-fat, low-sodium cheese. Fats and oils Soft margarine without trans fats. Vegetable oil. Low-fat, reduced-fat, or light mayonnaise and salad dressings (reduced-sodium). Canola, safflower, olive, soybean, and sunflower oils. Avocado. Seasoning and other foods Herbs. Spices. Seasoning mixes  without salt. Unsalted popcorn and pretzels. Fat-free sweets. What foods are not recommended? The items listed may not be a complete list. Talk with your dietitian about what dietary choices are best for you. Grains Baked goods made with fat, such as croissants, muffins, or some breads. Dry pasta or rice meal packs. Vegetables Creamed or fried vegetables. Vegetables in a cheese sauce. Regular canned vegetables (not low-sodium or reduced-sodium). Regular canned tomato sauce and paste (not low-sodium or reduced-sodium). Regular tomato and vegetable juice (not low-sodium or reduced-sodium). Angie Fava.  Olives. Fruits Canned fruit in a light or heavy syrup. Fried fruit. Fruit in cream or butter sauce. Meat and other protein foods Fatty cuts of meat. Ribs. Fried meat. Berniece Salines. Sausage. Bologna and other processed lunch meats. Salami. Fatback. Hotdogs. Bratwurst. Salted nuts and seeds. Canned beans with added salt. Canned or smoked fish. Whole eggs or egg yolks. Chicken or [place] with skin. Dairy Whole or 2% milk, cream, and half-and-half. Whole or full-fat cream cheese. Whole-fat or sweetened yogurt. Full-fat cheese. Nondairy creamers. Whipped toppings. Processed cheese and cheese spreads. Fats and oils Butter. Stick margarine. Lard. Shortening. Ghee. Bacon fat. Tropical oils, such as coconut, palm kernel, or palm oil. Seasoning and other foods Salted popcorn and pretzels. Onion salt, garlic salt, seasoned salt, table salt, and sea salt. Worcestershire sauce. Tartar sauce. Barbecue sauce. Teriyaki sauce. Soy sauce, including reduced-sodium. Steak sauce. Canned and packaged gravies. Fish sauce. Oyster sauce. Cocktail sauce. Horseradish that you find on the shelf. Ketchup. Mustard. Meat flavorings and tenderizers. Bouillon cubes. Hot sauce and Tabasco sauce. Premade or packaged marinades. Premade or packaged taco seasonings. Relishes. Regular salad dressings. Where to find more information:  National Heart, Lung, and [place]: https://wilson-eaton.com/  American Heart Association: www.heart.org Summary  The DASH eating plan is a healthy eating plan that has been shown to reduce high blood pressure (hypertension). It may also reduce your risk for type 2 diabetes, heart disease, and stroke.  With the DASH eating plan, you should limit salt (sodium) intake to 2,300 mg a day. If you have hypertension, you may need to reduce your sodium intake to 1,500 mg a day.  When on the DASH eating plan, aim to eat more fresh fruits and vegetables, whole grains, lean proteins, low-fat dairy, and heart-healthy  fats.  Work with your health care provider or diet and nutrition specialist (dietitian) to adjust your eating plan to your individual calorie needs. This information is not intended to replace advice given to you by your health care provider. Make sure you discuss any questions you have with your health care provider. Document Revised: 09/05/2017 Document Reviewed: 09/16/2016 Elsevier Patient Education  2020 [employer].

## 2020-01-14 ENCOUNTER — Other Ambulatory Visit: Payer: Self-pay

## 2020-01-14 DIAGNOSIS — I10 Essential (primary) hypertension: Secondary | ICD-10-CM

## 2020-01-14 DIAGNOSIS — E782 Mixed hyperlipidemia: Secondary | ICD-10-CM

## 2020-01-14 DIAGNOSIS — E1122 Type 2 diabetes mellitus with diabetic chronic kidney disease: Secondary | ICD-10-CM

## 2020-01-14 NOTE — Progress Notes (Signed)
Appt was on 01/12/2020 with Sherre Poot, PharmD.

## 2020-01-18 LAB — COLOGUARD: Cologuard: NEGATIVE

## 2020-01-24 DIAGNOSIS — Z940 Kidney transplant status: Secondary | ICD-10-CM | POA: Diagnosis not present

## 2020-01-24 DIAGNOSIS — K209 Esophagitis, unspecified without bleeding: Secondary | ICD-10-CM | POA: Diagnosis not present

## 2020-01-24 DIAGNOSIS — Z1211 Encounter for screening for malignant neoplasm of colon: Secondary | ICD-10-CM | POA: Diagnosis not present

## 2020-02-01 ENCOUNTER — Encounter: Payer: Self-pay | Admitting: Family Medicine

## 2020-02-03 ENCOUNTER — Ambulatory Visit (INDEPENDENT_AMBULATORY_CARE_PROVIDER_SITE_OTHER): Payer: Medicare Other | Admitting: Legal Medicine

## 2020-02-03 ENCOUNTER — Other Ambulatory Visit: Payer: Self-pay

## 2020-02-03 ENCOUNTER — Encounter: Payer: Self-pay | Admitting: Legal Medicine

## 2020-02-03 VITALS — BP 130/78 | HR 101 | Temp 97.7°F | Resp 16 | Ht 64.0 in | Wt 168.8 lb

## 2020-02-03 DIAGNOSIS — N189 Chronic kidney disease, unspecified: Secondary | ICD-10-CM

## 2020-02-03 DIAGNOSIS — N179 Acute kidney failure, unspecified: Secondary | ICD-10-CM

## 2020-02-03 DIAGNOSIS — K922 Gastrointestinal hemorrhage, unspecified: Secondary | ICD-10-CM

## 2020-02-03 LAB — CBC WITH DIFFERENTIAL/PLATELET
Basophils Absolute: 0 10*3/uL (ref 0.0–0.2)
Basos: 0 %
EOS (ABSOLUTE): 0.1 10*3/uL (ref 0.0–0.4)
Eos: 1 %
Hematocrit: 29.5 % — ABNORMAL LOW (ref 37.5–51.0)
Hemoglobin: 9.9 g/dL — ABNORMAL LOW (ref 13.0–17.7)
Immature Grans (Abs): 0 10*3/uL (ref 0.0–0.1)
Immature Granulocytes: 0 %
Lymphocytes Absolute: 2.3 10*3/uL (ref 0.7–3.1)
Lymphs: 31 %
MCH: 33.4 pg — ABNORMAL HIGH (ref 26.6–33.0)
MCHC: 33.6 g/dL (ref 31.5–35.7)
MCV: 100 fL — ABNORMAL HIGH (ref 79–97)
Monocytes Absolute: 0.6 10*3/uL (ref 0.1–0.9)
Monocytes: 8 %
Neutrophils Absolute: 4.5 10*3/uL (ref 1.4–7.0)
Neutrophils: 60 %
Platelets: 214 10*3/uL (ref 150–450)
RBC: 2.96 x10E6/uL — ABNORMAL LOW (ref 4.14–5.80)
RDW: 13.8 % (ref 11.6–15.4)
WBC: 7.5 10*3/uL (ref 3.4–10.8)

## 2020-02-03 NOTE — Assessment & Plan Note (Signed)
Upper GI bleeding is stopped.  He had 2 esophageal ulcers that Gi is following.

## 2020-02-03 NOTE — Assessment & Plan Note (Signed)
AN INDIVIDUAL CARE PLAN was established and reinforced today.  The patient's status was assessed using clinical findings on exam, labs, and other diagnostic testing. Patient's success at meeting treatment goals based on disease specific evidence-bassed guidelines and found to be in fair control. RECOMMENDATIONS include maintaining present medicines and treatment. 

## 2020-02-03 NOTE — Progress Notes (Signed)
Established Patient Office Visit  Subjective:  Patient ID: Victor Schultz, male    DOB: 01-Jun-1951  Age: 69 y.o. MRN: [id number]  CC:  Chief Complaint  Patient presents with  . Upper GI bleeding    HPI Victor Schultz presents for GI bleed- he has seen Dr. Priscille Heidelberg and egd showed 2 esophageal ulcers not bleeding.  No further bleeding, he is on pantoprazole bid for one month , then decrease to one a day.  Past Medical History:  Diagnosis Date  . Atherosclerotic heart disease of native coronary artery with unstable angina pectoris ([place])   . Atrophy of thyroid (acquired)   . History of end stage renal disease   . Hypertensive heart and chronic kidney disease without heart failure, with stage 5 chronic kidney disease, or end stage renal disease ([place])   . Kidney transplant status   . Long term (current) use of insulin ([place])   . Mixed hyperlipidemia   . Type 2 diabetes mellitus with diabetic chronic kidney disease [facility])     Past Surgical History:  Procedure Laterality Date  . CATARACT EXTRACTION    . CORONARY ARTERY BYPASS GRAFT    . ESOPHAGOGASTRODUODENOSCOPY (EGD) WITH PROPOFOL Left 12/23/2019   Procedure: ESOPHAGOGASTRODUODENOSCOPY (EGD) WITH PROPOFOL;  Surgeon: [name], MD;  Location: [place];  Service: Endoscopy;  Laterality: Left;  . KIDNEY TRANSPLANT     1974, 1982    Family History  Problem Relation Age of Onset  . Liver disease Mother   . Alzheimer's disease Father     Social History   Socioeconomic History  . Marital status: Divorced    Spouse name: Not on file  . Number of children: 1  . Years of education: Not on file  . Highest education level: Not on file  Occupational History  . Occupation: retired  Tobacco Use  . Smoking status: Current Every Day Smoker    Packs/day: 0.50    Years: 39.00    Pack years: 19.50    Types: Cigarettes  . Smokeless tobacco: Never Used  Substance and Sexual Activity  . Alcohol use: Never  . Drug use: Never  .  Sexual activity: Not Currently  Other Topics Concern  . Not on file  Social History Narrative  . Not on file   Social Determinants of Health   Financial Resource Strain:   . Difficulty of Paying Living Expenses:   Food Insecurity: No Food Insecurity  . Worried About Charity fundraiser in the Last Year: Never true  . Ran Out of Food in the Last Year: Never true  Transportation Needs: No Transportation Needs  . Lack of Transportation (Medical): No  . Lack of Transportation (Non-Medical): No  Physical Activity:   . Days of Exercise per Week:   . Minutes of Exercise per Session:   Stress:   . Feeling of Stress :   Social Connections: Moderately Isolated  . Frequency of Communication with Friends and Family: More than three times a week  . Frequency of Social Gatherings with Friends and Family: Once a week  . Attends Religious Services: Never  . Active Member of Clubs or Organizations: No  . Attends Archivist Meetings: Never  . Marital Status: Divorced  Human resources officer Violence:   . Fear of Current or Ex-Partner:   . Emotionally Abused:   [name] Physically Abused:   . Sexually Abused:     Outpatient Medications Prior to Visit  Medication Sig Dispense Refill  . aspirin 81  MG EC tablet Take 1 tablet (81 mg total) by mouth daily. 30 tablet 12  . atorvastatin (LIPITOR) 10 MG tablet Take 1 tablet (10 mg total) by mouth daily. (Patient taking differently: Take 5 mg by mouth daily. ) 90 tablet 0  . azaTHIOprine (IMURAN) 50 MG tablet Take 125 mg by mouth daily. Taking 1.5 tablets in the am and 1 tablets in the evening.    . B-D UF III MINI PEN NEEDLES 31G X 5 MM MISC USE WITH LANTUS AS DIRECTED    . LANTUS SOLOSTAR 100 UNIT/ML Solostar Pen Inject 40 Units into the skin at bedtime.     [name] levothyroxine (SYNTHROID) 50 MCG tablet Take 1 tablet (50 mcg total) by mouth daily. 90 tablet 0  . metoprolol tartrate (LOPRESSOR) 25 MG tablet Take 0.5 tablets (12.5 mg total) by mouth 2 (two)  times daily. (Patient taking differently: Take 25 mg by mouth 2 (two) times daily. ) 90 tablet 0  . pantoprazole (PROTONIX) 40 MG tablet Take 1 tablet (40 mg total) by mouth 2 (two) times daily. 60 tablet 2  . predniSONE (DELTASONE) 5 MG tablet Take 5 mg by mouth every other day.    . sodium bicarbonate 650 MG tablet Take 1,300 mg by mouth 2 (two) times daily.     . tamsulosin (FLOMAX) 0.4 MG CAPS capsule Take 1 capsule (0.4 mg total) by mouth daily after supper. 30 capsule 0  . Vitamin D, Ergocalciferol, (DRISDOL) 1.25 MG (50000 UNIT) CAPS capsule Take 50,000 Units by mouth once a week.     No facility-administered medications prior to visit.    No Known Allergies  ROS Review of Systems  Constitutional: Negative.   HENT: Negative.   Eyes: Negative.   Respiratory: Negative.   Cardiovascular: Negative.   Gastrointestinal: Negative.   Endocrine: Negative.   Genitourinary: Negative.   Skin: Negative.   Neurological: Negative.   Hematological: Negative.   Psychiatric/Behavioral: Negative.       Objective:    Physical Exam  Constitutional: He is oriented to person, place, and time. He appears well-developed and well-nourished.  HENT:  Head: Normocephalic and atraumatic.  Right Ear: External ear normal.  Left Ear: External ear normal.  Nose: Nose normal.  Mouth/Throat: Oropharynx is clear and moist.  Eyes: Pupils are equal, round, and reactive to light. Conjunctivae and EOM are normal.  Cardiovascular: Normal rate, regular rhythm and normal heart sounds.  Pulmonary/Chest: Effort normal and breath sounds normal.  Abdominal: Soft. Bowel sounds are normal.  Musculoskeletal:        General: Normal range of motion.     Cervical back: Normal range of motion and neck supple.  Neurological: He is alert and oriented to person, place, and time. He has normal reflexes.  Vitals reviewed.   BP 130/78   Pulse (!) 101   Temp 97.7 F (36.5 C)   Resp 16   Ht 5\' 4"  (1.626 m)   Wt 168  lb 12.8 oz (76.6 kg)   SpO2 96%   BMI 28.97 kg/m  Wt Readings from Last 3 Encounters:  02/03/20 168 lb 12.8 oz (76.6 kg)  01/03/20 169 lb (76.7 kg)  12/26/19 163 lb 5.8 oz (74.1 kg)     Health Maintenance Due  Topic Date Due  . Hepatitis C Screening  Never done  . FOOT EXAM  Never done  . OPHTHALMOLOGY EXAM  Never done  . COVID-19 Vaccine (1) Never done  . TETANUS/TDAP  Never done  .  COLONOSCOPY  Never done  . PNA vac Low Risk Adult (1 of 2 - PCV13) Never done    There are no preventive care reminders to display for this patient.  Lab Results  Component Value Date   TSH 3.310 12/06/2019   Lab Results  Component Value Date   WBC 8.8 01/03/2020   HGB 10.0 (L) 01/03/2020   HCT 29.7 (L) 01/03/2020   MCV 101 (H) 01/03/2020   PLT 287 01/03/2020   Lab Results  Component Value Date   NA 143 01/11/2020   K 5.2 01/11/2020   CO2 18 (L) 01/11/2020   GLUCOSE 198 (H) 01/11/2020   BUN 40 (H) 01/11/2020   CREATININE 3.20 (H) 01/11/2020   BILITOT 0.5 01/11/2020   ALKPHOS 89 01/11/2020   AST 24 01/11/2020   ALT 24 01/11/2020   PROT 6.3 01/11/2020   ALBUMIN 2.6 (L) 01/11/2020   CALCIUM 8.3 (L) 01/11/2020   ANIONGAP 9 12/27/2019   Lab Results  Component Value Date   CHOL 196 12/06/2019   Lab Results  Component Value Date   HDL 46 12/06/2019   Lab Results  Component Value Date   LDLCALC 106 (H) 12/06/2019   Lab Results  Component Value Date   TRIG 258 (H) 12/06/2019   Lab Results  Component Value Date   CHOLHDL 4.3 12/06/2019   Lab Results  Component Value Date   HGBA1C 6.5 (H) 12/06/2019      Assessment & Plan:   Problem List Items Addressed This Visit      Digestive   Acute upper GI bleeding - Primary    Upper GI bleeding is stopped.  He had 2 esophageal ulcers that Gi is following.      Relevant Orders   CBC with Differential/Platelet     Genitourinary   Acute kidney injury superimposed on CKD ([place])    AN INDIVIDUAL CARE PLAN was established  and reinforced today.  The patient's status was assessed using clinical findings on exam, labs, and other diagnostic testing. Patient's success at meeting treatment goals based on disease specific evidence-bassed guidelines and found to be in fair control. RECOMMENDATIONS include maintaining present medicines and treatment.         No orders of the defined types were placed in this encounter.   Follow-up: Return in about 2 months (around 04/04/2020), or Chronic diabetes visit, for fasting.    Reinaldo Meeker, MD

## 2020-02-04 NOTE — Progress Notes (Signed)
Anemia remains- are you still bleeding?, try another 3 hemocults to monitor. lp

## 2020-02-15 ENCOUNTER — Ambulatory Visit (INDEPENDENT_AMBULATORY_CARE_PROVIDER_SITE_OTHER): Payer: Medicare Other

## 2020-02-15 ENCOUNTER — Other Ambulatory Visit: Payer: Self-pay | Admitting: Legal Medicine

## 2020-02-15 DIAGNOSIS — K922 Gastrointestinal hemorrhage, unspecified: Secondary | ICD-10-CM

## 2020-02-15 LAB — POC HEMOCCULT BLD/STL (HOME/3-CARD/SCREEN)
Card #2 Fecal Occult Blod, POC: POSITIVE
Fecal Occult Blood, POC: POSITIVE — AB

## 2020-02-15 MED ORDER — PANTOPRAZOLE SODIUM 40 MG PO TBEC: 40.0000 mg | DELAYED_RELEASE_TABLET | Freq: Two times a day (BID) | ORAL | 2 refills | Status: DC

## 2020-02-15 NOTE — Progress Notes (Signed)
He continues to lose blood, refer to Dr. Paulita Fujita  his gastroenterologist. Stop all aspirin and NSAIDS.  He needs to be on PPI if not already. lp

## 2020-02-17 ENCOUNTER — Encounter: Payer: Self-pay | Admitting: Legal Medicine

## 2020-02-17 DIAGNOSIS — N189 Chronic kidney disease, unspecified: Secondary | ICD-10-CM | POA: Diagnosis not present

## 2020-02-17 DIAGNOSIS — E559 Vitamin D deficiency, unspecified: Secondary | ICD-10-CM | POA: Diagnosis not present

## 2020-02-17 DIAGNOSIS — N250 Renal osteodystrophy: Secondary | ICD-10-CM | POA: Diagnosis not present

## 2020-02-17 DIAGNOSIS — E875 Hyperkalemia: Secondary | ICD-10-CM | POA: Diagnosis not present

## 2020-02-17 DIAGNOSIS — E785 Hyperlipidemia, unspecified: Secondary | ICD-10-CM | POA: Diagnosis not present

## 2020-02-17 DIAGNOSIS — I10 Essential (primary) hypertension: Secondary | ICD-10-CM | POA: Diagnosis not present

## 2020-02-17 DIAGNOSIS — D649 Anemia, unspecified: Secondary | ICD-10-CM | POA: Diagnosis not present

## 2020-02-17 DIAGNOSIS — E1169 Type 2 diabetes mellitus with other specified complication: Secondary | ICD-10-CM | POA: Diagnosis not present

## 2020-02-17 DIAGNOSIS — Z940 Kidney transplant status: Secondary | ICD-10-CM | POA: Diagnosis not present

## 2020-02-17 DIAGNOSIS — E211 Secondary hyperparathyroidism, not elsewhere classified: Secondary | ICD-10-CM | POA: Diagnosis not present

## 2020-02-17 DIAGNOSIS — E872 Acidosis: Secondary | ICD-10-CM | POA: Diagnosis not present

## 2020-02-17 DIAGNOSIS — N186 End stage renal disease: Secondary | ICD-10-CM | POA: Diagnosis not present

## 2020-02-21 DIAGNOSIS — R3914 Feeling of incomplete bladder emptying: Secondary | ICD-10-CM | POA: Diagnosis not present

## 2020-02-21 DIAGNOSIS — R3915 Urgency of urination: Secondary | ICD-10-CM | POA: Diagnosis not present

## 2020-02-21 DIAGNOSIS — N401 Enlarged prostate with lower urinary tract symptoms: Secondary | ICD-10-CM | POA: Diagnosis not present

## 2020-02-28 ENCOUNTER — Other Ambulatory Visit: Payer: Self-pay

## 2020-02-28 MED ORDER — PREDNISONE 5 MG PO TABS: 5.0000 mg | ORAL_TABLET | ORAL | 3 refills | Status: DC

## 2020-02-29 DIAGNOSIS — E118 Type 2 diabetes mellitus with unspecified complications: Secondary | ICD-10-CM | POA: Diagnosis not present

## 2020-02-29 DIAGNOSIS — E785 Hyperlipidemia, unspecified: Secondary | ICD-10-CM | POA: Diagnosis not present

## 2020-02-29 DIAGNOSIS — E039 Hypothyroidism, unspecified: Secondary | ICD-10-CM | POA: Diagnosis not present

## 2020-02-29 DIAGNOSIS — N184 Chronic kidney disease, stage 4 (severe): Secondary | ICD-10-CM | POA: Diagnosis not present

## 2020-03-03 ENCOUNTER — Other Ambulatory Visit: Payer: Self-pay

## 2020-03-03 MED ORDER — AZATHIOPRINE 50 MG PO TABS: 125.0000 mg | ORAL_TABLET | Freq: Every day | ORAL | 1 refills | Status: DC

## 2020-03-07 ENCOUNTER — Other Ambulatory Visit: Payer: Self-pay | Admitting: Legal Medicine

## 2020-03-07 ENCOUNTER — Other Ambulatory Visit: Payer: Self-pay

## 2020-03-07 MED ORDER — AZATHIOPRINE 50 MG PO TABS: 125.0000 mg | ORAL_TABLET | Freq: Every day | ORAL | 5 refills | Status: DC

## 2020-03-22 ENCOUNTER — Other Ambulatory Visit: Payer: Self-pay

## 2020-03-22 MED ORDER — ATORVASTATIN CALCIUM 10 MG PO TABS: 10.0000 mg | ORAL_TABLET | Freq: Every day | ORAL | 0 refills | Status: DC

## 2020-03-22 MED ORDER — LEVOTHYROXINE SODIUM 50 MCG PO TABS: 50.0000 ug | ORAL_TABLET | Freq: Every day | ORAL | 0 refills | Status: DC

## 2020-03-29 ENCOUNTER — Other Ambulatory Visit: Payer: Self-pay | Admitting: Gastroenterology

## 2020-04-05 ENCOUNTER — Encounter: Payer: Self-pay | Admitting: Legal Medicine

## 2020-04-05 ENCOUNTER — Ambulatory Visit (INDEPENDENT_AMBULATORY_CARE_PROVIDER_SITE_OTHER): Payer: Medicare Other | Admitting: Legal Medicine

## 2020-04-05 ENCOUNTER — Other Ambulatory Visit: Payer: Self-pay

## 2020-04-05 VITALS — BP 150/62 | HR 100 | Temp 97.4°F | Resp 20 | Ht 64.0 in | Wt 167.0 lb

## 2020-04-05 DIAGNOSIS — E1122 Type 2 diabetes mellitus with diabetic chronic kidney disease: Secondary | ICD-10-CM | POA: Diagnosis not present

## 2020-04-05 DIAGNOSIS — Z940 Kidney transplant status: Secondary | ICD-10-CM

## 2020-04-05 DIAGNOSIS — E559 Vitamin D deficiency, unspecified: Secondary | ICD-10-CM

## 2020-04-05 DIAGNOSIS — I2510 Atherosclerotic heart disease of native coronary artery without angina pectoris: Secondary | ICD-10-CM | POA: Diagnosis not present

## 2020-04-05 DIAGNOSIS — K219 Gastro-esophageal reflux disease without esophagitis: Secondary | ICD-10-CM | POA: Diagnosis not present

## 2020-04-05 DIAGNOSIS — Z6828 Body mass index (BMI) 28.0-28.9, adult: Secondary | ICD-10-CM | POA: Diagnosis not present

## 2020-04-05 DIAGNOSIS — E782 Mixed hyperlipidemia: Secondary | ICD-10-CM | POA: Diagnosis not present

## 2020-04-05 DIAGNOSIS — I10 Essential (primary) hypertension: Secondary | ICD-10-CM | POA: Diagnosis not present

## 2020-04-05 DIAGNOSIS — K739 Chronic hepatitis, unspecified: Secondary | ICD-10-CM

## 2020-04-05 DIAGNOSIS — D582 Other hemoglobinopathies: Secondary | ICD-10-CM | POA: Diagnosis not present

## 2020-04-05 DIAGNOSIS — J440 Chronic obstructive pulmonary disease with acute lower respiratory infection: Secondary | ICD-10-CM

## 2020-04-05 DIAGNOSIS — J984 Other disorders of lung: Secondary | ICD-10-CM

## 2020-04-05 DIAGNOSIS — J209 Acute bronchitis, unspecified: Secondary | ICD-10-CM

## 2020-04-05 HISTORY — DX: Chronic hepatitis, unspecified: K73.9

## 2020-04-05 LAB — PULMONARY FUNCTION TEST
FEV1/FVC: 70.3 %
FEV1: 1.42 L
FVC: 1.84 L

## 2020-04-05 NOTE — Progress Notes (Signed)
Subjective:  Patient ID: Victor Schultz, male    DOB: 26-Jul-1951  Age: 69 y.o. MRN: [id number]  Chief Complaint  Patient presents with  . Hyperlipidemia  . Hypertension  . Diabetes    HPI: Chronic visit. Dr. Leeroy Bock- endocrine Patient present with type 2 diabetes.  Specifically, this is type 2, insulin requiring diabetes, complicated by rnal disease.  Compliance with treatment has been good; patient take medicines as directed, maintains diet and exercise regimen, follows up as directed, and is keeping glucose diary.  Date of  diagnosis 2000.  Depression screen has been performed.Tobacco screen nonsmoker. Current medicines for diabetes lantus.  Patient is on none for renal protection and atorvastatin for cholesterol control.  Patient performs foot exams daily and last ophthalmologic exam was needs eye exm.  Patient presents for follow up of hypertension.  Patient tolerating metoprl well with side effects.  Patient was diagnosed with hypertension 2010 so has been treated for hypertension for 10 years.Patient is working on maintaining diet and exercise regimen and follows up as directed. Complication include none.  Patient presents with hyperlipidemia.  Compliance with treatment has been good; patient takes medicines as directed, maintains low cholesterol diet, follows up as directed, and maintains exercise regimen.  Patient is using atorvastatin without problems.  Patient had kidney transplant slow decrease of renal function.  CORONARY ARTERY DISEASE  Patient presents in follow up of CAD. Patient was diagnosed in 2008. The patient has no associated CHF. The patient is currently taking a beta blocker, statin, and aspirin. CAD was diagnosed 13 years ago.  Patient is having no angina. Patient has used no NTG.  Patient is followed by cardiology.  Patient had CABG in 2008 . Last angiography was 2008, last echocardiogram unknown.   Current Outpatient Medications on File Prior to Visit  Medication Sig  Dispense Refill  . atorvastatin (LIPITOR) 10 MG tablet Take 1 tablet (10 mg total) by mouth daily. 90 tablet 0  . azaTHIOprine (IMURAN) 50 MG tablet Take 2.5 tablets (125 mg total) by mouth daily. Taking 1.5 tablets in the am and 1 tablets in the evening. 225 tablet 5  . B-D UF III MINI PEN NEEDLES 31G X 5 MM MISC USE WITH LANTUS AS DIRECTED    . LANTUS SOLOSTAR 100 UNIT/ML Solostar Pen Inject 40 Units into the skin at bedtime.     [name] levothyroxine (SYNTHROID) 50 MCG tablet Take 1 tablet (50 mcg total) by mouth daily. 90 tablet 0  . metoprolol tartrate (LOPRESSOR) 25 MG tablet Take 0.5 tablets (12.5 mg total) by mouth 2 (two) times daily. (Patient taking differently: Take 25 mg by mouth 2 (two) times daily. ) 90 tablet 0  . pantoprazole (PROTONIX) 40 MG tablet Take 1 tablet (40 mg total) by mouth 2 (two) times daily. 60 tablet 2  . predniSONE (DELTASONE) 5 MG tablet Take 1 tablet (5 mg total) by mouth every other day. 45 tablet 3  . sodium bicarbonate 650 MG tablet Take 1,300 mg by mouth 2 (two) times daily.     . tamsulosin (FLOMAX) 0.4 MG CAPS capsule Take 1 capsule (0.4 mg total) by mouth daily after supper. 30 capsule 0  . Vitamin D, Ergocalciferol, (DRISDOL) 1.25 MG (50000 UNIT) CAPS capsule Take 50,000 Units by mouth once a week.     No current facility-administered medications on file prior to visit.   Past Medical History:  Diagnosis Date  . Atherosclerotic heart disease of native coronary artery with unstable angina pectoris ([place])   .  Atrophy of thyroid (acquired)   . History of end stage renal disease   . Hypertensive heart and chronic kidney disease without heart failure, with stage 5 chronic kidney disease, or end stage renal disease ([place])   . Kidney transplant status   . Long term (current) use of insulin ([place])   . Mixed hyperlipidemia    Past Surgical History:  Procedure Laterality Date  . CATARACT EXTRACTION    . CORONARY ARTERY BYPASS GRAFT    . ESOPHAGOGASTRODUODENOSCOPY  (EGD) WITH PROPOFOL Left 12/23/2019   Procedure: ESOPHAGOGASTRODUODENOSCOPY (EGD) WITH PROPOFOL;  Surgeon: [name], MD;  Location: [facility];  Service: Endoscopy;  Laterality: Left;  . KIDNEY TRANSPLANT     1974, 1982    Family History  Problem Relation Age of Onset  . Liver disease Mother   . Alzheimer's disease Father    Social History   Socioeconomic History  . Marital status: Divorced    Spouse name: Not on file  . Number of children: 1  . Years of education: Not on file  . Highest education level: Not on file  Occupational History  . Occupation: retired  Tobacco Use  . Smoking status: Current Every Day Smoker    Packs/day: 0.50    Years: 39.00    Pack years: 19.50    Types: Cigarettes  . Smokeless tobacco: Never Used  Vaping Use  . Vaping Use: Never used  Substance and Sexual Activity  . Alcohol use: Never  . Drug use: Never  . Sexual activity: Not Currently  Other Topics Concern  . Not on file  Social History Narrative  . Not on file   Social Determinants of Health   Financial Resource Strain:   . Difficulty of Paying Living Expenses:   Food Insecurity: No Food Insecurity  . Worried About Charity fundraiser in the Last Year: Never true  . Ran Out of Food in the Last Year: Never true  Transportation Needs: No Transportation Needs  . Lack of Transportation (Medical): No  . Lack of Transportation (Non-Medical): No  Physical Activity:   . Days of Exercise per Week:   . Minutes of Exercise per Session:   Stress:   . Feeling of Stress :   Social Connections: Socially Isolated  . Frequency of Communication with Friends and Family: More than three times a week  . Frequency of Social Gatherings with Friends and Family: Once a week  . Attends Religious Services: Never  . Active Member of Clubs or Organizations: No  . Attends Archivist Meetings: Never  . Marital Status: Divorced    Review of Systems  Constitutional: Negative.   HENT:  Negative.   Eyes: Negative.   Respiratory: Negative.   Cardiovascular: Negative.   Gastrointestinal: Negative.   Genitourinary:       Renal tranplant  Musculoskeletal: Negative.   Skin: Negative.   Neurological: Negative.   Psychiatric/Behavioral: Negative.      Objective:  BP (!) 150/62   Pulse 100   Temp (!) 97.4 F (36.3 C)   Resp 20   Ht 5\' 4"  (1.626 m)   Wt 167 lb (75.8 kg)   SpO2 94%   BMI 28.67 kg/m   BP/Weight 04/05/2020 02/03/2020 11/18/9415  Systolic BP 408 144 818  Diastolic BP 62 78 70  Wt. (Lbs) 167 168.8 169  BMI 28.67 28.97 29.01    Physical Exam Vitals reviewed.  Constitutional:      Appearance: Normal appearance.  HENT:  Head: Normocephalic and atraumatic.     Right Ear: Tympanic membrane, ear canal and external ear normal.     Left Ear: Tympanic membrane, ear canal and external ear normal.     Nose: Nose normal.     Mouth/Throat:     Mouth: Mucous membranes are dry.  Eyes:     Extraocular Movements: Extraocular movements intact.     Conjunctiva/sclera: Conjunctivae normal.     Pupils: Pupils are equal, round, and reactive to light.  Cardiovascular:     Rate and Rhythm: Normal rate.     Pulses: Normal pulses.     Heart sounds: Normal heart sounds.  Pulmonary:     Effort: Pulmonary effort is normal.  Abdominal:     General: Abdomen is flat. Bowel sounds are normal.  Musculoskeletal:        General: Normal range of motion.     Cervical back: Normal range of motion and neck supple.  Skin:    General: Skin is warm.     Capillary Refill: Capillary refill takes less than 2 seconds.  Neurological:     General: No focal deficit present.     Mental Status: He is alert and oriented to person, place, and time.     PFT: FVC 44%, FEV1 47%, FEV1/FVC 106, PEF 33%  Lab Results  Component Value Date   WBC 7.5 02/03/2020   HGB 9.9 (L) 02/03/2020   HCT 29.5 (L) 02/03/2020   PLT 214 02/03/2020   GLUCOSE 198 (H) 01/11/2020   CHOL 196 12/06/2019    TRIG 258 (H) 12/06/2019   HDL 46 12/06/2019   LDLCALC 106 (H) 12/06/2019   ALT 24 01/11/2020   AST 24 01/11/2020   NA 143 01/11/2020   K 5.2 01/11/2020   CL 111 (H) 01/11/2020   CREATININE 3.20 (H) 01/11/2020   BUN 40 (H) 01/11/2020   CO2 18 (L) 01/11/2020   TSH 3.310 12/06/2019   INR 1.2 12/23/2019   HGBA1C 6.5 (H) 12/06/2019   MICROALBUR 150 12/06/2019      Assessment & Plan:   1. Essential hypertension - CBC with Differential/Platelet - Comprehensive metabolic panel An individual hypertension care plan was established and reinforced today.  The patient's status was assessed using clinical findings on exam and labs or diagnostic tests. The patient's success at meeting treatment goals on disease specific evidence-based guidelines and found to be well controlled. SELF MANAGEMENT: The patient and I together assessed ways to personally work towards obtaining the recommended goals. RECOMMENDATIONS: avoid decongestants found in common cold remedies, decrease consumption of alcohol, perform routine monitoring of BP with home BP cuff, exercise, reduction of dietary salt, take medicines as prescribed, try not to miss doses and quit smoking.  Regular exercise and maintaining a healthy weight is needed.  Stress reduction may help. A CLINICAL SUMMARY including written plan identify barriers to care unique to individual due to social or financial issues.  We attempt to mutually creat solutions for individual and family understanding.  2. Type 2 diabetes mellitus with chronic kidney disease, without long-term current use of insulin, unspecified CKD stage (HCC) - Hemoglobin A1c An individual care plan for diabetes was established and reinforced today.  The patient's status was assessed using clinical findings on exam, labs and diagnostic testing. Patient success at meeting goals based on disease specific evidence-based guidelines and found to be good controlled. Medications were assessed and  patient's understanding of the medical issues , including barriers were assessed. Recommend adherence to a  diabetic diet, a graduated exercise program, HgbA1c level is checked quarterly, and urine microalbumin performed yearly .  Annual mono-filament sensation testing performed. Lower blood pressure and control hyperlipidemia is important. Get annual eye exams and annual flu shots and smoking cessation discussed.  Self management goals were discussed.  Chronic renal disease with transplant - Comprehensive metabolic panel Patient had transplant and having slow deterioration of his renal function  4. Mixed hyperlipidemia - Lipid panel AN INDIVIDUAL CARE PLAN for hyperlipidemia/ cholesterol was established and reinforced today.  The patient's status was assessed using clinical findings on exam, lab and other diagnostic tests. The patient's disease status was assessed based on evidence-based guidelines and found to be well controlled. MEDICATIONS were reviewed. SELF MANAGEMENT GOALS have been discussed and patient's success at attaining the goal of low cholesterol was assessed. RECOMMENDATION given include regular exercise 3 days a week and low cholesterol/low fat diet. CLINICAL SUMMARY including written plan to identify barriers unique to the patient due to social or economic  reasons was discussed.  5. Gastroesophageal reflux disease without esophagitis Plan of care was formulated today.  he is doing well.  A plan of care was formulated using patient exam, tests and other sources to optimize care using evidence based information.  Recommend no smoking, no eating after supper, avoid fatty foods, elevate Head of bed, avoid tight fitting clothing.  Continue on pantoprazole.  6. CAD in native artery An individual plan was formulated based on patient history and exam, labs and evidence based data. Patient has not had recent angina or nitroglycerin use. continue present treatment.  7. Vitamin D  deficiency Patient is on vitamin D supplements   9. Acute bronchitis with COPD [facility]) - Pulmonary Function Test Patient has some COPD from his Chronic smoking  10. Chronic hepatitis ([place]) He was tested for hepatitis C in past but not treated  11. Restrictive lung disease PFT demonstrate mainly restrictive pattern  12. BMI 28.0-28.9,adult An individualize plan was formulated for obesity using patient history and physical exam to encourage weight loss.  An evidence based program was formulated.  Patient is to cut portion size with meals and to plan physical exercise 3 days a week at least 20 minutes.  Weight watchers and other programs are helpful.  Planned amount of weight loss 5 lbs.    No orders of the defined types were placed in this encounter.   Orders Placed This Encounter  Procedures  . CBC with Differential/Platelet  . Comprehensive metabolic panel  . Lipid panel  . Hemoglobin A1c  . Pulmonary Function Test     Follow-up: Return in about 4 months (around 08/05/2020), or mcr pe kim, for fasting.  An After Visit Summary was printed and given to the patient.  [place] [phone]

## 2020-04-06 NOTE — Chronic Care Management (AMB) (Signed)
Chronic Care Management Pharmacy  Name: Victor Schultz  MRN: [id number] DOB: [birth year]  Chief Complaint/ HPI  [name],  69 y.o. , male presents for their Initial CCM visit with the clinical pharmacist via telephone due to COVID-19 Pandemic.  PCP : Lillard Anes, MD  Their chronic conditions include: CAD, HTN, GERD, DM2, CKD, HLD.  Office Visits: 04/05/2020 - FEV1/FVC 70.3%. No med changes.  02/03/2020 - anemia remains. 3 hemoccults to check.  01/03/2020 - Transition of care visit. Acute GI bleed. Scr elevated.   12/06/2019 - Seen for chronic visit. Complaints of acid reflux. Patient has been on pantoprazole x35month. Lifestyle modifications.    Consult Visit: 02/17/2020 - Nephrology - referral to GI/urology. F/u needed with cardiology.  12/23/2019 - EGD performed 12/22/2019 - admitted to hospital for vomiting blood and blood in stools. Given abx, blood transfusion and pantoprazole.  11/01/2019 - Endocrinology visit 07/14/2019 - Nephrology visit.   Medications: Outpatient Encounter Medications as of 04/07/2020  Medication Sig Note  . atorvastatin (LIPITOR) 10 MG tablet Take 1 tablet (10 mg total) by mouth daily.   [name] azaTHIOprine (IMURAN) 50 MG tablet Take 2.5 tablets (125 mg total) by mouth daily. Taking 1.5 tablets in the am and 1 tablets in the evening.   . B-D UF III MINI PEN NEEDLES 31G X 5 MM MISC USE WITH LANTUS AS DIRECTED   . LANTUS SOLOSTAR 100 UNIT/ML Solostar Pen Inject 40 Units into the skin at bedtime.    [name] levothyroxine (SYNTHROID) 50 MCG tablet Take 1 tablet (50 mcg total) by mouth daily.   . metoprolol tartrate (LOPRESSOR) 25 MG tablet Take 0.5 tablets (12.5 mg total) by mouth 2 (two) times daily. (Patient taking differently: Take 25 mg by mouth 2 (two) times daily. )   . pantoprazole (PROTONIX) 40 MG tablet Take 1 tablet (40 mg total) by mouth 2 (two) times daily.   . predniSONE (DELTASONE) 5 MG tablet Take 1 tablet (5 mg total) by mouth every other  day.   . sodium bicarbonate 650 MG tablet Take 1,300 mg by mouth 2 (two) times daily.    . tamsulosin (FLOMAX) 0.4 MG CAPS capsule Take 1 capsule (0.4 mg total) by mouth daily after supper.   . Vitamin D, Ergocalciferol, (DRISDOL) 1.25 MG (50000 UNIT) CAPS capsule Take 50,000 Units by mouth once a week. 12/22/2019: Sunday   No facility-administered encounter medications on file as of 04/07/2020.     Current Diagnosis/Assessment:  Goals Addressed            This Visit's Progress   . Pharmacy Care Plan       CARE PLAN ENTRY  Current Barriers:  . Chronic Disease Management support, education, and care coordination needs related to HTN, HLD, and DMII  Pharmacist Clinical Goal(s):  . Ensure safety, efficacy, and affordability of medications . Recommend maintaining BP <140/90 mmHg . Goal Cholesterol: TC <200, LDL <100, TG <150 HDL >50 mg/dL . Goal Hemoglobin A1C <7%.    Interventions: . Comprehensive medication review performed. . Recommend patient contact GI about increased loose stools.    Patient Self Care Activities:  . Recommend engaging in 30 minutes of moderate intensity exercise 5 times each week.  . Recommend following the Dash diet guidance of incorporating fruits, vegetables, whole-grains, low-fat dairy products, lean meats, legumes and nuts for optimal health. . Recommend keeping sodium intake less than 1500 mg/day.  . Recommend receiving annual flu shot each Fall. . Recommend a Pneumonia shot at next  visit with Dr. Henrene Pastor.  Please see past updates related to this goal by clicking on the "Past Updates" button in the selected goal         Diabetes   Recent Relevant Labs: Lab Results  Component Value Date/Time   HGBA1C 6.4 (H) 04/05/2020 09:35 AM   HGBA1C 6.5 (H) 12/06/2019 09:17 AM   MICROALBUR 150 12/06/2019 09:03 AM     Checking BG: Never   Patient has failed these meds in past: none listed Patient is currently controlled on the following  medications:lantus solostar, bd pen needles  We discussed: smoking cessation and diet. Patient is limited on diet due to low potassium diet per Nephrology. Patient says he tries to do well and watch sugar. He does not check blood sugar but knows when it is low and how to treat.   Update 04/07/2020 - He has had 1 low in the past month. It was 70 mg/dL. Ate a candy bar.  He eats like he is supposed to. He checks blood sugar when feels like it is low.   Plan  Continue current medications,  Hypertension   BP today is:  <140/90  Office blood pressures are  BP Readings from Last 3 Encounters:  04/05/20 (!) 150/62  02/03/20 130/78  01/03/20 (!) 154/70    Patient has failed these meds in the past: none listed Patient is currently controlled on the following medications: metoprolol tartrate 25 mg bid  Patient checks BP at home: never  Patient home BP readings are ranging: have always been good. Says they have increased since recent GI bleed. His metoprolol has been increased. Patient is still weak and low HGB after 3 units of blood.   We discussed smoking cessation and the improvements he has made by decreasing to 1/2 pack/day. Patient will let PharmD know if he wants medication support to stop smoking.   Update 04/07/2020- Smoked 5 cigarettes/day the past two days. Hasn't smoked yet today. He is cutting back and trying not to smoke unless he has to.   Plan  Continue current medications   Hyperlipidemia/CAD   Lipid Panel     Component Value Date/Time   CHOL 167 04/05/2020 0935   TRIG 283 (H) 04/05/2020 0935   HDL 44 04/05/2020 0935   CHOLHDL 3.8 04/05/2020 0935   LDLCALC 77 04/05/2020 0935   LABVLDL 46 (H) 04/05/2020 0935     The 10-year ASCVD risk score Mikey Bussing DC Jr., et al., 2013) is: 51.1%   Values used to calculate the score:     Age: 82 years     Sex: Male     Is Non-Hispanic African American: No     Diabetic: Yes     Tobacco smoker: Yes     Systolic Blood Pressure:  150 mmHg     Is BP treated: Yes     HDL Cholesterol: 44 mg/dL     Total Cholesterol: 167 mg/dL   Patient has failed these meds in past: vascepa Patient is currently controlled on the following medications: aspirin ec 81 mg, atorvastatin 10 mg  We discussed:  diet and exercise extensively. Patient is still weak from blood loss. He is working to build up stamina by walking around his house and walking to his mailbox.   Update 04/07/2020 - Patient is not able to walk to mailbox without being winded. He is hoping this will improve after discovering sources of bleeding.   Plan  Continue current medications     and  GERD/GI  bleed/Ulcer    Patient has failed these meds in past: Tums Patient is currently controlled on the following medications:  pantoprazole 40 mg bid  We discussed:  Continuing current regimen. Following up with GI doctor. Patient denies any additional aspirin or NSAID use that could have contributed. Patient does not drink alcohol.    Update 04/07/2020 - Black in stool when he goes to the bathroom but not every time. Patient is concerned with increased loose stools. He is unable to mow his yard without stopping for loose stools. He is waking up during the night with diarrhea as well. The urgency of these events is impeding his QOL and ADL. Patient cannot determine a trigger and has not changed anything. The looser stool began after GI bleed but is worsening in frequency/urgency. Pharmacist counseled patient on contacting GI regarding these symptoms.   Plan  Continue current medications and contact GI regarding loose stools.   Hypothyroidism   TSH  Date Value Ref Range Status  12/06/2019 3.310 0.450 - 4.500 uIU/mL Final     Patient has failed these meds in past: n/a Patient is currently controlled on the following medications: levothyroxine  We discussed:  Continue current medications and the proper administration of levothyroxine 30 mins before food and all other  meds.    Update 04/07/2020 - Patient states that medication adherence to treatment and symptoms management is well controlled.   Plan  Continue current medications   S/P Kidney transplant/CKD stage 4   Lab Results  Component Value Date   CREATININE 4.06 (H) 04/05/2020   CREATININE 3.20 (H) 01/11/2020   CREATININE 3.19 (H) 01/03/2020    Patient has failed these meds in past: none  Patient is currently controlled on the following medications: Azathiprine 50 mg tablet, Prednisone 5, tamsulosin   We discussed:  Following up with Nephrology as recommended. Continuing to monitor diet as directed. Avoiding potatoes and potassium rich foods. We educated on why and what he takes for anti-rejection.   Update 04/06/2020 - Had a bladder study that showed 15% didn't empty from bladder. He follows up with urologist next week.   Plan  Continue current medications and continue to follow-up with Urologist.     Vaccines   Reviewed and discussed patient's vaccination history. Has completed both COVID vaccines.   Immunization History  Administered Date(s) Administered  . Influenza-Unspecified 10/07/2018    Plan  Recommended patient receive Pneumonia vaccine in office at next visit.   Health Maintenance   Patient is currently controlled on the following medications:  Vitamin D 50,000 unit - low Vitamin D Sodium Bicarbonate - metabolic acidosis  We discussed:  Patient says that he does not take any OTC without approval from his doctor due to kidneys.   Plan  Continue current medications  Medication Management   Pt uses [facility] and [place] for all medications Does use pill box to keep organized.  Pt endorses good compliance - may have missed 5 evening med doses in his life.   We discussed: continuing good medication compliance.   Plan  Patient will reach out with any questions or concerns.    Follow up: 2 months.

## 2020-04-07 ENCOUNTER — Ambulatory Visit: Payer: Medicare Other

## 2020-04-07 DIAGNOSIS — I10 Essential (primary) hypertension: Secondary | ICD-10-CM

## 2020-04-07 DIAGNOSIS — E1122 Type 2 diabetes mellitus with diabetic chronic kidney disease: Secondary | ICD-10-CM

## 2020-04-07 DIAGNOSIS — I1310 Hypertensive heart and chronic kidney disease without heart failure, with stage 1 through stage 4 chronic kidney disease, or unspecified chronic kidney disease: Secondary | ICD-10-CM

## 2020-04-07 NOTE — Patient Instructions (Signed)
Visit Information  Goals Addressed            This Visit's Progress   . Pharmacy Care Plan       CARE PLAN ENTRY  Current Barriers:  . Chronic Disease Management support, education, and care coordination needs related to HTN, HLD, and DMII  Pharmacist Clinical Goal(s):  . Ensure safety, efficacy, and affordability of medications . Recommend maintaining BP <140/90 mmHg . Goal Cholesterol: TC <200, LDL <100, TG <150 HDL >50 mg/dL . Goal Hemoglobin A1C <7%.    Interventions: . Comprehensive medication review performed. . Recommend patient contact GI about increased loose stools.    Patient Self Care Activities:  . Recommend engaging in 30 minutes of moderate intensity exercise 5 times each week.  . Recommend following the Dash diet guidance of incorporating fruits, vegetables, whole-grains, low-fat dairy products, lean meats, legumes and nuts for optimal health. . Recommend keeping sodium intake less than 1500 mg/day.  . Recommend receiving annual flu shot each Fall. . Recommend a Pneumonia shot at next visit with Dr. Henrene Pastor.  Please see past updates related to this goal by clicking on the "Past Updates" button in the selected goal         The patient verbalized understanding of instructions provided today and declined a print copy of patient instruction materials.   Telephone follow up appointment with pharmacy team member scheduled for: 05/2020  Sherre Poot, PharmD, [facility] Clinical Pharmacist [facility] [phone] (office) [phone] (mobile)

## 2020-04-07 NOTE — Progress Notes (Signed)
Chronic anemia, glucose 112, kidney tests worsening, liver tests normal, triglycerides high, watch diet, A1c 6.4 good

## 2020-04-08 LAB — COMPREHENSIVE METABOLIC PANEL
ALT: 17 IU/L (ref 0–44)
AST: 23 IU/L (ref 0–40)
Albumin/Globulin Ratio: 0.6 — ABNORMAL LOW (ref 1.2–2.2)
Albumin: 2.5 g/dL — ABNORMAL LOW (ref 3.8–4.8)
Alkaline Phosphatase: 89 IU/L (ref 48–121)
BUN/Creatinine Ratio: 9 — ABNORMAL LOW (ref 10–24)
BUN: 37 mg/dL — ABNORMAL HIGH (ref 8–27)
Bilirubin Total: 0.5 mg/dL (ref 0.0–1.2)
CO2: 17 mmol/L — ABNORMAL LOW (ref 20–29)
Calcium: 8.1 mg/dL — ABNORMAL LOW (ref 8.6–10.2)
Chloride: 109 mmol/L — ABNORMAL HIGH (ref 96–106)
Creatinine, Ser: 4.06 mg/dL — ABNORMAL HIGH (ref 0.76–1.27)
GFR calc Af Amer: 16 mL/min/{1.73_m2} — ABNORMAL LOW (ref >59)
GFR calc non Af Amer: 14 mL/min/{1.73_m2} — ABNORMAL LOW (ref >59)
Globulin, Total: 3.9 g/dL (ref 1.5–4.5)
Glucose: 112 mg/dL — ABNORMAL HIGH (ref 65–99)
Potassium: 4.9 mmol/L (ref 3.5–5.2)
Sodium: 143 mmol/L (ref 134–144)
Total Protein: 6.4 g/dL (ref 6.0–8.5)

## 2020-04-08 LAB — CBC WITH DIFFERENTIAL/PLATELET
Basophils Absolute: 0 10*3/uL (ref 0.0–0.2)
Basos: 0 %
EOS (ABSOLUTE): 0.1 10*3/uL (ref 0.0–0.4)
Eos: 1 %
Hematocrit: 29.9 % — ABNORMAL LOW (ref 37.5–51.0)
Hemoglobin: 10 g/dL — ABNORMAL LOW (ref 13.0–17.7)
Immature Grans (Abs): 0.1 10*3/uL (ref 0.0–0.1)
Immature Granulocytes: 1 %
Lymphocytes Absolute: 1.3 10*3/uL (ref 0.7–3.1)
Lymphs: 20 %
MCH: 33.1 pg — ABNORMAL HIGH (ref 26.6–33.0)
MCHC: 33.4 g/dL (ref 31.5–35.7)
MCV: 99 fL — ABNORMAL HIGH (ref 79–97)
Monocytes Absolute: 0.4 10*3/uL (ref 0.1–0.9)
Monocytes: 6 %
Neutrophils Absolute: 4.8 10*3/uL (ref 1.4–7.0)
Neutrophils: 72 %
Platelets: 235 10*3/uL (ref 150–450)
RBC: 3.02 x10E6/uL — ABNORMAL LOW (ref 4.14–5.80)
RDW: 14.4 % (ref 11.6–15.4)
WBC: 6.7 10*3/uL (ref 3.4–10.8)

## 2020-04-08 LAB — LIPID PANEL
Chol/HDL Ratio: 3.8 ratio (ref 0.0–5.0)
Cholesterol, Total: 167 mg/dL (ref 100–199)
HDL: 44 mg/dL (ref >39)
LDL Chol Calc (NIH): 77 mg/dL (ref 0–99)
Triglycerides: 283 mg/dL — ABNORMAL HIGH (ref 0–149)
VLDL Cholesterol Cal: 46 mg/dL — ABNORMAL HIGH (ref 5–40)

## 2020-04-08 LAB — HEMOGLOBIN A1C
Est. average glucose Bld gHb Est-mCnc: 137 mg/dL
Hgb A1c MFr Bld: 6.4 % — ABNORMAL HIGH (ref 4.8–5.6)

## 2020-04-08 LAB — CARDIOVASCULAR RISK ASSESSMENT

## 2020-04-15 DIAGNOSIS — D649 Anemia, unspecified: Secondary | ICD-10-CM

## 2020-04-15 DIAGNOSIS — N186 End stage renal disease: Secondary | ICD-10-CM

## 2020-04-15 DIAGNOSIS — L570 Actinic keratosis: Secondary | ICD-10-CM

## 2020-04-15 DIAGNOSIS — L253 Unspecified contact dermatitis due to other chemical products: Secondary | ICD-10-CM

## 2020-04-15 HISTORY — DX: Unspecified contact dermatitis due to other chemical products: L25.3

## 2020-04-15 HISTORY — DX: Actinic keratosis: L57.0

## 2020-04-15 HISTORY — DX: Anemia, unspecified: D64.9

## 2020-04-15 HISTORY — DX: End stage renal disease: N18.6

## 2020-04-19 ENCOUNTER — Other Ambulatory Visit: Payer: Self-pay | Admitting: Urology

## 2020-04-27 NOTE — Patient Instructions (Addendum)
DUE TO COVID-19 ONLY ONE VISITOR IS ALLOWED TO COME WITH YOU AND STAY IN THE WAITING ROOM ONLY DURING PRE OP AND PROCEDURE DAY OF SURGERY. THE 1 VISITOR MAY VISIT WITH YOU AFTER SURGERY IN YOUR PRIVATE ROOM DURING VISITING HOURS ONLY!  YOU NEED TO HAVE A COVID 19 TEST ON: 05/05/20 @  10:00 am , THIS TEST MUST BE DONE BEFORE SURGERY, COME  [address], [place] , [address].  ([place]) ONCE YOUR COVID TEST IS COMPLETED, PLEASE BEGIN THE QUARANTINE INSTRUCTIONS AS OUTLINED IN YOUR HANDOUT.                Victor Schultz    Your procedure is scheduled on: 05/09/20   Report to [facility] Main  Entrance   Report to admitting at: 8:30 AM     Call this number if you have problems the morning of surgery [phone]    Remember: Do not eat solid food :After Midnight. Clear liquids from midnight until 7:30 am  CLEAR LIQUID DIET   Foods Allowed                                                                     Foods Excluded  Coffee and tea, regular and decaf                             liquids that you cannot  Plain Jell-O any favor except red or purple                                           see through such as: Fruit ices (not with fruit pulp)                                     milk, soups, orange juice  Iced Popsicles                                    All solid food Carbonated beverages, regular and diet                                    Cranberry, grape and apple juices Sports drinks like Gatorade Lightly seasoned clear broth or consume(fat free) Sugar, honey syrup  Sample Menu Breakfast                                Lunch                                     Supper Cranberry juice                    Beef broth  Chicken broth Jell-O                                     Grape juice                           Apple juice Coffee or tea                        Jell-O                                      Popsicle                                                 Coffee or tea                        Coffee or tea  ____________________________________________________________________  BRUSH YOUR TEETH MORNING OF SURGERY AND RINSE YOUR MOUTH OUT, NO CHEWING GUM CANDY OR MINTS.     Take these medicines the morning of surgery with A SIP OF WATER: imuran,levothyroxine,metoprolol,pantoprazole,prednisone,tamsulosin.  How to Manage Your Diabetes Before and After Surgery  Why is it important to control my blood sugar before and after surgery? . Improving blood sugar levels before and after surgery helps healing and can limit problems. . A way of improving blood sugar control is eating a healthy diet by: o  Eating less sugar and carbohydrates o  Increasing activity/exercise o  Talking with your doctor about reaching your blood sugar goals . High blood sugars (greater than 180 mg/dL) can raise your risk of infections and slow your recovery, so you will need to focus on controlling your diabetes during the weeks before surgery. . Make sure that the doctor who takes care of your diabetes knows about your planned surgery including the date and location.  How do I manage my blood sugar before surgery? . Check your blood sugar at least 4 times a day, starting 2 days before surgery, to make sure that the level is not too high or low. o Check your blood sugar the morning of your surgery when you wake up and every 2 hours until you get to the Short Stay unit. . If your blood sugar is less than 70 mg/dL, you will need to treat for low blood sugar: o Do not take insulin. o Treat a low blood sugar (less than 70 mg/dL) with  cup of clear juice (cranberry or apple), 4 glucose tablets, OR glucose gel. o Recheck blood sugar in 15 minutes after treatment (to make sure it is greater than 70 mg/dL). If your blood sugar is not greater than 70 mg/dL on recheck, call [phone] for further instructions. . Report your blood sugar to the short stay  nurse when you get to Short Stay.  . If you are admitted to the hospital after surgery: o Your blood sugar will be checked by the staff and you will probably be given insulin after surgery (instead of oral diabetes medicines) to make sure you have good blood sugar levels. o The goal for blood sugar control after surgery is 80-180 mg/dL.  WHAT DO I DO ABOUT MY DIABETES MEDICATION?   . THE NIGHT BEFORE SURGERY, take half of the dose of your Lantus insulin.       DO NOT TAKE ANY DIABETIC MEDICATIONS DAY OF YOUR SURGERY                                You may not have any metal on your body including hair pins and              piercings  Do not wear jewelry, lotions, powders or perfumes, deodorant             Men may shave face and neck.   Do not bring valuables to the hospital. [place].  Contacts, dentures or bridgework may not be worn into surgery.  Leave suitcase in the car. After surgery it may be brought to your room.     Patients discharged the day of surgery will not be allowed to drive home. IF YOU ARE HAVING SURGERY AND GOING HOME THE SAME DAY, YOU MUST HAVE AN ADULT TO DRIVE YOU HOME AND BE WITH YOU FOR 24 HOURS. YOU MAY GO HOME BY TAXI OR UBER OR ORTHERWISE, BUT AN ADULT MUST ACCOMPANY YOU HOME AND STAY WITH YOU FOR 24 HOURS.  Name and phone number of your driver:  Special Instructions: N/A              Please read over the following fact sheets you were given: _____________________________________________________________________          [facility] - Preparing for Surgery Before surgery, you can play an important role.  Because skin is not sterile, your skin needs to be as free of germs as possible.  You can reduce the number of germs on your skin by washing with CHG (chlorahexidine gluconate) soap before surgery.  CHG is an antiseptic cleaner which kills germs and bonds with the skin to continue killing germs even after  washing. Please DO NOT use if you have an allergy to CHG or antibacterial soaps.  If your skin becomes reddened/irritated stop using the CHG and inform your nurse when you arrive at Short Stay. Do not shave (including legs and underarms) for at least 48 hours prior to the first CHG shower.  You may shave your face/neck. Please follow these instructions carefully:  1.  Shower with CHG Soap the night before surgery and the  morning of Surgery.  2.  If you choose to wash your hair, wash your hair first as usual with your  normal  shampoo.  3.  After you shampoo, rinse your hair and body thoroughly to remove the  shampoo.                           4.  Use CHG as you would any other liquid soap.  You can apply chg directly  to the skin and wash                       Gently with a scrungie or clean washcloth.  5.  Apply the CHG Soap to your body ONLY FROM THE NECK DOWN.   Do not use on face/ open  Wound or open sores. Avoid contact with eyes, ears mouth and genitals (private parts).                       Wash face,  Genitals (private parts) with your normal soap.             6.  Wash thoroughly, paying special attention to the area where your surgery  will be performed.  7.  Thoroughly rinse your body with warm water from the neck down.  8.  DO NOT shower/wash with your normal soap after using and rinsing off  the CHG Soap.                9.  Pat yourself dry with a clean towel.            10.  Wear clean pajamas.            11.  Place clean sheets on your bed the night of your first shower and do not  sleep with pets. Day of Surgery : Do not apply any lotions/deodorants the morning of surgery.  Please wear clean clothes to the hospital/surgery center.  FAILURE TO FOLLOW THESE INSTRUCTIONS MAY RESULT IN THE CANCELLATION OF YOUR SURGERY PATIENT SIGNATURE_________________________________  NURSE  SIGNATURE__________________________________  ________________________________________________________________________   Adam Phenix  An incentive spirometer is a tool that can help keep your lungs clear and active. This tool measures how well you are filling your lungs with each breath. Taking long deep breaths may help reverse or decrease the chance of developing breathing (pulmonary) problems (especially infection) following:  A long period of time when you are unable to move or be active. BEFORE THE PROCEDURE   If the spirometer includes an indicator to show your best effort, your nurse or respiratory therapist will set it to a desired goal.  If possible, sit up straight or lean slightly forward. Try not to slouch.  Hold the incentive spirometer in an upright position. INSTRUCTIONS FOR USE  1. Sit on the edge of your bed if possible, or sit up as far as you can in bed or on a chair. 2. Hold the incentive spirometer in an upright position. 3. Breathe out normally. 4. Place the mouthpiece in your mouth and seal your lips tightly around it. 5. Breathe in slowly and as deeply as possible, raising the piston or the ball toward the top of the column. 6. Hold your breath for 3-5 seconds or for as long as possible. Allow the piston or ball to fall to the bottom of the column. 7. Remove the mouthpiece from your mouth and breathe out normally. 8. Rest for a few seconds and repeat Steps 1 through 7 at least 10 times every 1-2 hours when you are awake. Take your time and take a few normal breaths between deep breaths. 9. The spirometer may include an indicator to show your best effort. Use the indicator as a goal to work toward during each repetition. 10. After each set of 10 deep breaths, practice coughing to be sure your lungs are clear. If you have an incision (the cut made at the time of surgery), support your incision when coughing by placing a pillow or rolled up towels firmly  against it. Once you are able to get out of bed, walk around indoors and cough well. You may stop using the incentive spirometer when instructed by your caregiver.  RISKS AND COMPLICATIONS  Take your time so you do not  get dizzy or light-headed.  If you are in pain, you may need to take or ask for pain medication before doing incentive spirometry. It is harder to take a deep breath if you are having pain. AFTER USE  Rest and breathe slowly and easily.  It can be helpful to keep track of a log of your progress. Your caregiver can provide you with a simple table to help with this. If you are using the spirometer at home, follow these instructions: [place] IF:   You are having difficultly using the spirometer.  You have trouble using the spirometer as often as instructed.  Your pain medication is not giving enough relief while using the spirometer.  You develop fever of 100.5 F (38.1 C) or higher. SEEK IMMEDIATE MEDICAL CARE IF:   You cough up bloody sputum that had not been present before.  You develop fever of 102 F (38.9 C) or greater.  You develop worsening pain at or near the incision site. MAKE SURE YOU:   Understand these instructions.  Will watch your condition.  Will get help right away if you are not doing well or get worse. Document Released: 02/03/2007 Document Revised: 12/16/2011 Document Reviewed: 04/06/2007 [facility] Patient Information 2014 [place], [place].   ________________________________________________________________________

## 2020-05-02 ENCOUNTER — Encounter (HOSPITAL_COMMUNITY)
Admission: RE | Admit: 2020-05-02 | Discharge: 2020-05-02 | Disposition: A | Discharge status: Home / Self Care | Payer: Medicare Other | Source: Ambulatory Visit | Attending: Urology | Admitting: Urology

## 2020-05-02 ENCOUNTER — Encounter (HOSPITAL_COMMUNITY): Payer: Self-pay

## 2020-05-02 ENCOUNTER — Other Ambulatory Visit: Payer: Self-pay

## 2020-05-02 DIAGNOSIS — F17200 Nicotine dependence, unspecified, uncomplicated: Secondary | ICD-10-CM | POA: Diagnosis not present

## 2020-05-02 DIAGNOSIS — N186 End stage renal disease: Secondary | ICD-10-CM | POA: Insufficient documentation

## 2020-05-02 DIAGNOSIS — N138 Other obstructive and reflux uropathy: Secondary | ICD-10-CM | POA: Diagnosis not present

## 2020-05-02 DIAGNOSIS — I120 Hypertensive chronic kidney disease with stage 5 chronic kidney disease or end stage renal disease: Secondary | ICD-10-CM | POA: Diagnosis not present

## 2020-05-02 DIAGNOSIS — Z940 Kidney transplant status: Secondary | ICD-10-CM | POA: Insufficient documentation

## 2020-05-02 DIAGNOSIS — N401 Enlarged prostate with lower urinary tract symptoms: Secondary | ICD-10-CM | POA: Diagnosis not present

## 2020-05-02 DIAGNOSIS — Z01812 Encounter for preprocedural laboratory examination: Secondary | ICD-10-CM | POA: Diagnosis not present

## 2020-05-02 DIAGNOSIS — E1122 Type 2 diabetes mellitus with diabetic chronic kidney disease: Secondary | ICD-10-CM | POA: Diagnosis not present

## 2020-05-02 DIAGNOSIS — I2510 Atherosclerotic heart disease of native coronary artery without angina pectoris: Secondary | ICD-10-CM | POA: Insufficient documentation

## 2020-05-02 DIAGNOSIS — E782 Mixed hyperlipidemia: Secondary | ICD-10-CM | POA: Insufficient documentation

## 2020-05-02 DIAGNOSIS — Z794 Long term (current) use of insulin: Secondary | ICD-10-CM | POA: Diagnosis not present

## 2020-05-02 DIAGNOSIS — E039 Hypothyroidism, unspecified: Secondary | ICD-10-CM | POA: Insufficient documentation

## 2020-05-02 HISTORY — DX: Hypothyroidism, unspecified: E03.9

## 2020-05-02 HISTORY — DX: Malignant (primary) neoplasm, unspecified: C80.1

## 2020-05-02 LAB — CBC
HCT: 29.9 % — ABNORMAL LOW (ref 39.0–52.0)
Hemoglobin: 9.5 g/dL — ABNORMAL LOW (ref 13.0–17.0)
MCH: 33.9 pg (ref 26.0–34.0)
MCHC: 31.8 g/dL (ref 30.0–36.0)
MCV: 106.8 fL — ABNORMAL HIGH (ref 80.0–100.0)
Platelets: 219 10*3/uL (ref 150–400)
RBC: 2.8 MIL/uL — ABNORMAL LOW (ref 4.22–5.81)
RDW: 15.7 % — ABNORMAL HIGH (ref 11.5–15.5)
WBC: 7.4 10*3/uL (ref 4.0–10.5)
nRBC: 0 % (ref 0.0–0.2)

## 2020-05-02 LAB — BASIC METABOLIC PANEL
Anion gap: 6 (ref 5–15)
BUN: 48 mg/dL — ABNORMAL HIGH (ref 8–23)
CO2: 19 mmol/L — ABNORMAL LOW (ref 22–32)
Calcium: 8 mg/dL — ABNORMAL LOW (ref 8.9–10.3)
Chloride: 113 mmol/L — ABNORMAL HIGH (ref 98–111)
Creatinine, Ser: 4 mg/dL — ABNORMAL HIGH (ref 0.61–1.24)
GFR calc Af Amer: 16 mL/min — ABNORMAL LOW (ref >60)
GFR calc non Af Amer: 14 mL/min — ABNORMAL LOW (ref >60)
Glucose, Bld: 155 mg/dL — ABNORMAL HIGH (ref 70–99)
Potassium: 5.4 mmol/L — ABNORMAL HIGH (ref 3.5–5.1)
Sodium: 138 mmol/L (ref 135–145)

## 2020-05-02 LAB — GLUCOSE, CAPILLARY: Glucose-Capillary: 142 mg/dL — ABNORMAL HIGH (ref 70–99)

## 2020-05-02 NOTE — Progress Notes (Addendum)
COVID Vaccine Completed:yes Date COVID Vaccine completed:01/23/20 COVID vaccine manufacturer: [employer]   PCP - Dr. Marya Fossa Cardiologist - No  Chest x-ray - 12/22/19 EPIC EKG - 12/27/19 EPIC Stress Test -  ECHO -  Cardiac Cath -  A1C: 6.4: 04/05/20 Sleep Study -  CPAP -   Fasting Blood Sugar - Pt. Does not check CBG. Checks Blood Sugar _____ times a day  Blood Thinner Instructions: Aspirin Instructions: Last Dose:  Anesthesia review: Hx: DIA,ESR,Kidnet Tx.,CAD,CABAGE. Pt. Was advised to stop smoking before surgery.  Patient denies shortness of breath, fever, cough and chest pain at PAT appointment   Patient verbalized understanding of instructions that were given to them at the PAT appointment. Patient was also instructed that they will need to review over the PAT instructions again at home before surgery.

## 2020-05-02 NOTE — Progress Notes (Signed)
Lab. Results: Hmg: 9.5 Hmc: 29.9 Cr: 4.0

## 2020-05-03 NOTE — Progress Notes (Signed)
Anesthesia Chart Review   Case: [id number] Date/Time: 05/09/20 0949   Procedure: TRANSURETHRAL INCISION OF THE PROSTATE (TUIP) (N/A )   Anesthesia type: General   Pre-op diagnosis: BENIGM PROSTATE HYPERPLASIA WIHT BLADDER OUTLET OBSTRUCTION   Location: [place] / [facility]   Surgeons: Irine Seal, MD      DISCUSSION:69 y.o. current every day smoker (19.5 pack years) with h/o DM II insulin requiring, CAD (CABG 2008), s/p kidney transplant (1974 and 1982) with recurrent advanced CKD follows with nephrology, BPH with bladder outlet obstruction scheduled for above procedure 05/09/2020 with Dr. Irine Seal.   Last cardiology OV note requested.  Last seen by cardiology 08/04/2018.  Stable at this visit.   Pt last seen by PCP 04/05/2020.  Stable at this visit.  Per OV note, "Patient was diagnosed in 2008. The patient has no associated CHF. The patient is currently taking a beta blocker, statin, and aspirin. CAD was diagnosed 13 years ago.  Patient is having no angina. Patient has used no NTG.  Patient is followed by cardiology.  Patient had CABG in 2008 . Last angiography was 2008, last echocardiogram unknown."  Last seen by nephrology 04/12/2020.  Per OV note, "Laboratory studies obtained at the time of his last office visit on 17 Feb 2020 showed a serum creatinine level to be 3.18 mg/dL with BUN of 42 mg/dL and estimated GFR of 19 mL/min. He did have laboratory studies obtained at his PCP's office on 05 April 2020 that showed the serum creatinine to be higher at 4.06 mg/dL, along with BUN of 37 mg/dL and estimated GFR of 14 mL/min. It is essential that obstructive uropathy is ruled out especially in the light of his recent hospitalization that was complicated by urinary retention. Discussed with patient starts if renal function continues to worsen that he may require renal replacement therapy.  Anticipate pt can proceed with planned procedure barring acute status change.   VS: BP (!) 161/67   Pulse 95    Temp 36.6 C (Oral)   Resp 18   Ht '5\' 4"'  (1.626 m)   Wt 75.4 kg   SpO2 98%   BMI 28.55 kg/m   PROVIDERS: Lillard Anes, MD is PCP   Andrey Spearman, MD is Cardiologist   Gifford Shave, MD is Nephrologist  LABS: Labs reviewed: Acceptable for surgery. (all labs ordered are listed, but only abnormal results are displayed)  Labs Reviewed  CBC - Abnormal; Notable for the following components:      Result Value   RBC 2.80 (*)    Hemoglobin 9.5 (*)    HCT 29.9 (*)    MCV 106.8 (*)    RDW 15.7 (*)    All other components within normal limits  BASIC METABOLIC PANEL - Abnormal; Notable for the following components:   Potassium 5.4 (*)    Chloride 113 (*)    CO2 19 (*)    Glucose, Bld 155 (*)    BUN 48 (*)    Creatinine, Ser 4.00 (*)    Calcium 8.0 (*)    GFR calc non Af Amer 14 (*)    GFR calc Af Amer 16 (*)    All other components within normal limits  GLUCOSE, CAPILLARY - Abnormal; Notable for the following components:   Glucose-Capillary 142 (*)    All other components within normal limits     IMAGES:   EKG: 12/22/2019 Rate 110 bpm Sinus tachycardia Probably LVH with secondary repol Anterior Q waves, possibly  due to LVH  CV:  Past Medical History:  Diagnosis Date  . Atherosclerotic heart disease of native coronary artery with unstable angina pectoris ([place])   . Atrophy of thyroid (acquired)   . Cancer ([place])    skin cancer on both arms.  . Diabetes mellitus without complication ([place])   . History of end stage renal disease   . Hypertensive heart and chronic kidney disease without heart failure, with stage 5 chronic kidney disease, or end stage renal disease ([place])    ESR,kidney transplant  . Hypothyroidism   . Kidney transplant status   . Long term (current) use of insulin ([place])   . Mixed hyperlipidemia     Past Surgical History:  Procedure Laterality Date  . CATARACT EXTRACTION    . CORONARY ARTERY BYPASS GRAFT    .  ESOPHAGOGASTRODUODENOSCOPY (EGD) WITH PROPOFOL Left 12/23/2019   Procedure: ESOPHAGOGASTRODUODENOSCOPY (EGD) WITH PROPOFOL;  Surgeon: [name], MD;  Location: [place];  Service: Endoscopy;  Laterality: Left;  . KIDNEY TRANSPLANT     1974, 1982    MEDICATIONS: . atorvastatin (LIPITOR) 10 MG tablet  . azaTHIOprine (IMURAN) 50 MG tablet  . B-D UF III MINI PEN NEEDLES 31G X 5 MM MISC  . LANTUS SOLOSTAR 100 UNIT/ML Solostar Pen  . levothyroxine (SYNTHROID) 50 MCG tablet  . metoprolol tartrate (LOPRESSOR) 25 MG tablet  . pantoprazole (PROTONIX) 40 MG tablet  . predniSONE (DELTASONE) 5 MG tablet  . sodium bicarbonate 650 MG tablet  . tamsulosin (FLOMAX) 0.4 MG CAPS capsule  . Vitamin D, Ergocalciferol, (DRISDOL) 1.25 MG (50000 UNIT) CAPS capsule   No current facility-administered medications for this encounter.   Konrad Felix, PA-C [facility] Pre-Surgical Testing [phone]

## 2020-05-04 NOTE — Anesthesia Preprocedure Evaluation (Addendum)
Anesthesia Evaluation  Patient identified by MRN, date of birth, ID band Patient awake    Reviewed: Allergy & Precautions, NPO status , Patient's Chart, lab work & pertinent test results, reviewed documented beta blocker date and time   History of Anesthesia Complications Negative for: history of anesthetic complications  Airway Mallampati: III  TM Distance: >3 FB Neck ROM: Full    Dental  (+) Edentulous Lower, Edentulous Upper   Pulmonary Current Smoker and Patient abstained from smoking.,    Pulmonary exam normal        Cardiovascular hypertension, Pt. on home beta blockers and Pt. on medications + CAD and + CABG (2008)  Normal cardiovascular exam     Neuro/Psych negative neurological ROS  negative psych ROS   GI/Hepatic GERD  Controlled and Medicated,(+) Hepatitis -  Endo/Other  diabetes, Type 2, Insulin DependentHypothyroidism   Renal/GU Renal InsufficiencyRenal disease (S/P renal transplant (1974 and 1982))  negative genitourinary   Musculoskeletal negative musculoskeletal ROS (+)   Abdominal   Peds  Hematology  (+) anemia , Hgb 9.5   Anesthesia Other Findings Day of surgery medications reviewed with patient.  Reproductive/Obstetrics negative OB ROS                           Anesthesia Physical Anesthesia Plan  ASA: III  Anesthesia Plan: General   Post-op Pain Management:    Induction: Intravenous  PONV Risk Score and Plan: 2 and Treatment may vary due to age or medical condition, Propofol infusion, Ondansetron and Dexamethasone  Airway Management Planned: LMA  Additional Equipment: None  Intra-op Plan:   Post-operative Plan: Extubation in OR  Informed Consent: I have reviewed the patients History and Physical, chart, labs and discussed the procedure including the risks, benefits and alternatives for the proposed anesthesia with the patient or authorized representative  who has indicated his/her understanding and acceptance.     Dental advisory given  Plan Discussed with: CRNA  Anesthesia Plan Comments: (See PAT note 05/02/2020, Konrad Felix, PA-C)      Anesthesia Quick Evaluation

## 2020-05-05 ENCOUNTER — Other Ambulatory Visit (HOSPITAL_COMMUNITY)
Admission: RE | Admit: 2020-05-05 | Discharge: 2020-05-05 | Disposition: A | Discharge status: Home / Self Care | Payer: Medicare Other | Source: Ambulatory Visit | Attending: Urology | Admitting: Urology

## 2020-05-05 DIAGNOSIS — Z20822 Contact with and (suspected) exposure to covid-19: Secondary | ICD-10-CM | POA: Insufficient documentation

## 2020-05-05 DIAGNOSIS — Z01812 Encounter for preprocedural laboratory examination: Secondary | ICD-10-CM | POA: Diagnosis not present

## 2020-05-05 LAB — SARS CORONAVIRUS 2 (TAT 6-24 HRS): SARS Coronavirus 2: NEGATIVE

## 2020-05-08 ENCOUNTER — Ambulatory Visit (INDEPENDENT_AMBULATORY_CARE_PROVIDER_SITE_OTHER): Payer: Medicare Other | Admitting: Legal Medicine

## 2020-05-08 ENCOUNTER — Other Ambulatory Visit: Payer: Self-pay

## 2020-05-08 VITALS — BP 160/70 | HR 68 | Temp 97.3°F | Resp 20 | Ht 64.0 in | Wt 167.0 lb

## 2020-05-08 DIAGNOSIS — I10 Essential (primary) hypertension: Secondary | ICD-10-CM | POA: Diagnosis not present

## 2020-05-08 DIAGNOSIS — R42 Dizziness and giddiness: Secondary | ICD-10-CM | POA: Insufficient documentation

## 2020-05-08 HISTORY — DX: Dizziness and giddiness: R42

## 2020-05-08 MED ORDER — CLONIDINE HCL 0.1 MG PO TABS
0.1000 mg | ORAL_TABLET | Freq: Once | ORAL | Status: AC
  Administered 2020-05-08: 0.1 mg via ORAL

## 2020-05-08 NOTE — Progress Notes (Signed)
Acute Office Visit  Subjective:    Patient ID: Victor Schultz, male    DOB: Feb 06, 1951, 69 y.o.   MRN: [id number]  Chief Complaint  Patient presents with   Hypertension    HPI Patient is in today for dizziness this AM, BS 90, He comes in and BP 186/70.  I repeated and it is 160/70. He denies chest pain or dyspnea.  No sweats. BS 196, He is not falling.  No nausea or vomiting.He was given clonidine and his BP remained at 160.  He was walking well and allowed to go home. He is concerned about a procedure to morrow by urologist.  Past Medical History:  Diagnosis Date   Atherosclerotic heart disease of native coronary artery with unstable angina pectoris ([place])    Atrophy of thyroid (acquired)    Cancer ([place])    skin cancer on both arms.   Diabetes mellitus without complication ([place])    History of end stage renal disease    Hypertensive heart and chronic kidney disease without heart failure, with stage 5 chronic kidney disease, or end stage renal disease (HCC)    ESR,kidney transplant   Hypothyroidism    Kidney transplant status    Long term (current) use of insulin ([place])    Mixed hyperlipidemia     Past Surgical History:  Procedure Laterality Date   CATARACT EXTRACTION     CORONARY ARTERY BYPASS GRAFT     ESOPHAGOGASTRODUODENOSCOPY (EGD) WITH PROPOFOL Left 12/23/2019   Procedure: ESOPHAGOGASTRODUODENOSCOPY (EGD) WITH PROPOFOL;  Surgeon: [name], MD;  Location: [facility];  Service: Endoscopy;  Laterality: Left;   KIDNEY TRANSPLANT     1974, 1982    Family History  Problem Relation Age of Onset   Liver disease Mother    Alzheimer's disease Father     Social History   Socioeconomic History   Marital status: Divorced    Spouse name: Not on file   Number of children: 1   Years of education: Not on file   Highest education level: Not on file  Occupational History   Occupation: retired  Tobacco Use   Smoking status: Current Every Day  Smoker    Packs/day: 0.50    Years: 39.00    Pack years: 19.50    Types: Cigarettes   Smokeless tobacco: Never Used  Scientific laboratory technician Use: Never used  Substance and Sexual Activity   Alcohol use: Never   Drug use: Never   Sexual activity: Not Currently  Other Topics Concern   Not on file  Social History Narrative   Not on file   Social Determinants of Health   Financial Resource Strain:    Difficulty of Paying Living Expenses:   Food Insecurity: No Food Insecurity   Worried About Running Out of Food in the Last Year: Never true   [place] in the Last Year: Never true  Transportation Needs: No Transportation Needs   Lack of Transportation (Medical): No   Lack of Transportation (Non-Medical): No  Physical Activity:    Days of Exercise per Week:    Minutes of Exercise per Session:   Stress:    Feeling of Stress :   Social Connections: Socially Isolated   Frequency of Communication with Friends and Family: More than three times a week   Frequency of Social Gatherings with Friends and Family: Once a week   Attends Religious Services: Never   Marine scientist or Organizations: No   Attends  Club or Organization Meetings: Never   Marital Status: Divorced  Human resources officer Violence:    Fear of Current or Ex-Partner:    Emotionally Abused:    Physically Abused:    Sexually Abused:     Outpatient Medications Prior to Visit  Medication Sig Dispense Refill   atorvastatin (LIPITOR) 20 MG tablet Take 20 mg by mouth daily.     azaTHIOprine (IMURAN) 50 MG tablet Take 2.5 tablets (125 mg total) by mouth daily. Taking 1.5 tablets in the am and 1 tablets in the evening. (Patient taking differently: Take 50-75 mg by mouth See admin instructions. Take 75 mg in the morning and 50 mg at night) 225 tablet 5   B-D UF III MINI PEN NEEDLES 31G X 5 MM MISC USE WITH LANTUS AS DIRECTED     LANTUS SOLOSTAR 100 UNIT/ML Solostar Pen Inject 40 Units into  the skin at bedtime.      levothyroxine (SYNTHROID) 50 MCG tablet Take 1 tablet (50 mcg total) by mouth daily. 90 tablet 0   metoprolol tartrate (LOPRESSOR) 25 MG tablet Take 0.5 tablets (12.5 mg total) by mouth 2 (two) times daily. (Patient taking differently: Take 25 mg by mouth 2 (two) times daily. ) 90 tablet 0   pantoprazole (PROTONIX) 40 MG tablet Take 1 tablet (40 mg total) by mouth 2 (two) times daily. (Patient taking differently: Take 40 mg by mouth daily. ) 60 tablet 2   predniSONE (DELTASONE) 5 MG tablet Take 1 tablet (5 mg total) by mouth every other day. 45 tablet 3   sodium bicarbonate 650 MG tablet Take 1,300 mg by mouth 2 (two) times daily.      tamsulosin (FLOMAX) 0.4 MG CAPS capsule Take 1 capsule (0.4 mg total) by mouth daily after supper. 30 capsule 0   Vitamin D, Ergocalciferol, (DRISDOL) 1.25 MG (50000 UNIT) CAPS capsule Take 50,000 Units by mouth every Sunday.      atorvastatin (LIPITOR) 10 MG tablet Take 1 tablet (10 mg total) by mouth daily. 90 tablet 0   No facility-administered medications prior to visit.    No Known Allergies  Review of Systems  Constitutional: Negative.   HENT: Negative.   Eyes: Negative.   Respiratory: Negative.   Cardiovascular: Negative.   Gastrointestinal: Negative.   Endocrine: Negative.   Genitourinary: Negative.   Musculoskeletal: Negative.   Skin: Negative.   Neurological: Positive for dizziness.  Psychiatric/Behavioral: Negative.        Objective:    Physical Exam Vitals reviewed.  Constitutional:      Appearance: Normal appearance.  HENT:     Head: Normocephalic and atraumatic.     Right Ear: Tympanic membrane normal.     Left Ear: Tympanic membrane normal.     Mouth/Throat:     Pharynx: Oropharynx is clear.  Eyes:     Extraocular Movements: Extraocular movements intact.     Conjunctiva/sclera: Conjunctivae normal.     Pupils: Pupils are equal, round, and reactive to light.  Cardiovascular:     Rate and  Rhythm: Normal rate and regular rhythm.     Pulses: Normal pulses.  Pulmonary:     Effort: Pulmonary effort is normal.  Abdominal:     General: Abdomen is flat. Bowel sounds are normal.  Musculoskeletal:     Cervical back: Normal range of motion and neck supple.  Skin:    General: Skin is warm.  Neurological:     General: No focal deficit present.     Mental Status:  He is alert.     Gait: Gait normal.     Deep Tendon Reflexes: Reflexes normal.  Psychiatric:        Mood and Affect: Mood normal.        Behavior: Behavior normal.     BP (!) 160/70    Pulse 68    Temp (!) 97.3 F (36.3 C)    Resp 20    Ht '5\' 4"'  (1.626 m)    Wt 167 lb (75.8 kg)    SpO2 96%    BMI 28.67 kg/m  Wt Readings from Last 3 Encounters:  05/08/20 167 lb (75.8 kg)  05/02/20 166 lb 5 oz (75.4 kg)  04/05/20 167 lb (75.8 kg)    Health Maintenance Due  Topic Date Due   Hepatitis C Screening  Never done   OPHTHALMOLOGY EXAM  Never done   COVID-19 Vaccine (1) Never done   TETANUS/TDAP  Never done   COLONOSCOPY  Never done   PNA vac Low Risk Adult (1 of 2 - PCV13) Never done   INFLUENZA VACCINE  05/07/2020    There are no preventive care reminders to display for this patient.   Lab Results  Component Value Date   TSH 3.310 12/06/2019   Lab Results  Component Value Date   WBC 7.4 05/02/2020   HGB 9.5 (L) 05/02/2020   HCT 29.9 (L) 05/02/2020   MCV 106.8 (H) 05/02/2020   PLT 219 05/02/2020   Lab Results  Component Value Date   NA 138 05/02/2020   K 5.4 (H) 05/02/2020   CO2 19 (L) 05/02/2020   GLUCOSE 155 (H) 05/02/2020   BUN 48 (H) 05/02/2020   CREATININE 4.00 (H) 05/02/2020   BILITOT 0.5 04/05/2020   ALKPHOS 89 04/05/2020   AST 23 04/05/2020   ALT 17 04/05/2020   PROT 6.4 04/05/2020   ALBUMIN 2.5 (L) 04/05/2020   CALCIUM 8.0 (L) 05/02/2020   ANIONGAP 6 05/02/2020   Lab Results  Component Value Date   CHOL 167 04/05/2020   Lab Results  Component Value Date   HDL 44  04/05/2020   Lab Results  Component Value Date   LDLCALC 77 04/05/2020   Lab Results  Component Value Date   TRIG 283 (H) 04/05/2020   Lab Results  Component Value Date   CHOLHDL 3.8 04/05/2020   Lab Results  Component Value Date   HGBA1C 6.4 (H) 04/05/2020       Assessment & Plan:  1. Essential hypertension An individual hypertension care plan was established and reinforced today.  The patient's status was assessed using clinical findings on exam and labs or diagnostic tests. The patient's success at meeting treatment goals on disease specific evidence-based guidelines and found to be well controlled. SELF MANAGEMENT: The patient and I together assessed ways to personally work towards obtaining the recommended goals. RECOMMENDATIONS: avoid decongestants found in common cold remedies, decrease consumption of alcohol, perform routine monitoring of BP with home BP cuff, exercise, reduction of dietary salt, take medicines as prescribed, try not to miss doses and quit smoking.  Regular exercise and maintaining a healthy weight is needed.  Stress reduction may help. A CLINICAL SUMMARY including written plan identify barriers to care unique to individual due to social or financial issues.  We attempt to mutually creat solutions for individual and family understanding.  2. Dizziness Probably had low BS in am with BS 90 at noon. It is fine now.  OK for surgery  Follow-up: Return if symptoms worsen or fail to improve.  An After Visit Summary was printed and given to the patient.  [place] [phone]

## 2020-05-08 NOTE — Progress Notes (Signed)
Pt. Was informed about the time change for surgery,pt. Is aware that he needs to be at the hospital at 8:00 am on 05/09/20.

## 2020-05-09 ENCOUNTER — Other Ambulatory Visit: Payer: Self-pay

## 2020-05-09 ENCOUNTER — Ambulatory Visit (HOSPITAL_COMMUNITY)
Admission: RE | Admit: 2020-05-09 | Discharge: 2020-05-09 | Disposition: A | Discharge status: Home / Self Care | Payer: Medicare Other | Attending: Urology | Admitting: Urology

## 2020-05-09 ENCOUNTER — Encounter (HOSPITAL_COMMUNITY): Payer: Self-pay | Admitting: Urology

## 2020-05-09 ENCOUNTER — Ambulatory Visit (HOSPITAL_COMMUNITY): Payer: Medicare Other | Admitting: Physician Assistant

## 2020-05-09 ENCOUNTER — Ambulatory Visit (HOSPITAL_COMMUNITY): Payer: Medicare Other | Admitting: Certified Registered"

## 2020-05-09 ENCOUNTER — Encounter (HOSPITAL_COMMUNITY)
Admission: RE | Disposition: A | Discharge status: Home / Self Care | Payer: Self-pay | Source: Home / Self Care | Attending: Urology

## 2020-05-09 DIAGNOSIS — K219 Gastro-esophageal reflux disease without esophagitis: Secondary | ICD-10-CM | POA: Insufficient documentation

## 2020-05-09 DIAGNOSIS — N320 Bladder-neck obstruction: Secondary | ICD-10-CM | POA: Insufficient documentation

## 2020-05-09 DIAGNOSIS — R3914 Feeling of incomplete bladder emptying: Secondary | ICD-10-CM | POA: Insufficient documentation

## 2020-05-09 DIAGNOSIS — I129 Hypertensive chronic kidney disease with stage 1 through stage 4 chronic kidney disease, or unspecified chronic kidney disease: Secondary | ICD-10-CM | POA: Diagnosis not present

## 2020-05-09 DIAGNOSIS — D638 Anemia in other chronic diseases classified elsewhere: Secondary | ICD-10-CM | POA: Diagnosis not present

## 2020-05-09 DIAGNOSIS — Z940 Kidney transplant status: Secondary | ICD-10-CM | POA: Diagnosis not present

## 2020-05-09 DIAGNOSIS — F17210 Nicotine dependence, cigarettes, uncomplicated: Secondary | ICD-10-CM | POA: Diagnosis not present

## 2020-05-09 DIAGNOSIS — Z8711 Personal history of peptic ulcer disease: Secondary | ICD-10-CM | POA: Diagnosis not present

## 2020-05-09 DIAGNOSIS — E039 Hypothyroidism, unspecified: Secondary | ICD-10-CM | POA: Diagnosis not present

## 2020-05-09 DIAGNOSIS — R3915 Urgency of urination: Secondary | ICD-10-CM | POA: Insufficient documentation

## 2020-05-09 DIAGNOSIS — Z79899 Other long term (current) drug therapy: Secondary | ICD-10-CM | POA: Diagnosis not present

## 2020-05-09 DIAGNOSIS — Z951 Presence of aortocoronary bypass graft: Secondary | ICD-10-CM | POA: Insufficient documentation

## 2020-05-09 DIAGNOSIS — I2510 Atherosclerotic heart disease of native coronary artery without angina pectoris: Secondary | ICD-10-CM | POA: Insufficient documentation

## 2020-05-09 DIAGNOSIS — Z794 Long term (current) use of insulin: Secondary | ICD-10-CM | POA: Insufficient documentation

## 2020-05-09 DIAGNOSIS — N184 Chronic kidney disease, stage 4 (severe): Secondary | ICD-10-CM | POA: Diagnosis not present

## 2020-05-09 DIAGNOSIS — N410 Acute prostatitis: Secondary | ICD-10-CM | POA: Diagnosis not present

## 2020-05-09 DIAGNOSIS — N401 Enlarged prostate with lower urinary tract symptoms: Secondary | ICD-10-CM | POA: Insufficient documentation

## 2020-05-09 DIAGNOSIS — Z79890 Hormone replacement therapy (postmenopausal): Secondary | ICD-10-CM | POA: Diagnosis not present

## 2020-05-09 DIAGNOSIS — E1122 Type 2 diabetes mellitus with diabetic chronic kidney disease: Secondary | ICD-10-CM | POA: Diagnosis not present

## 2020-05-09 HISTORY — PX: TRANSURETHRAL INCISION OF PROSTATE: SHX2573

## 2020-05-09 LAB — GLUCOSE, CAPILLARY
Glucose-Capillary: 137 mg/dL — ABNORMAL HIGH (ref 70–99)
Glucose-Capillary: 137 mg/dL — ABNORMAL HIGH (ref 70–99)

## 2020-05-09 SURGERY — INCISION, PROSTATE, TRANSURETHRAL
Anesthesia: General

## 2020-05-09 MED ORDER — PROMETHAZINE HCL 25 MG/ML IJ SOLN: 6.2500 mg | INTRAMUSCULAR | Status: DC | PRN

## 2020-05-09 MED ORDER — OXYCODONE HCL 5 MG PO TABS: 5.0000 mg | ORAL_TABLET | Freq: Once | ORAL | Status: DC | PRN

## 2020-05-09 MED ORDER — OXYCODONE HCL 5 MG PO TABS: 5.0000 mg | ORAL_TABLET | ORAL | Status: DC | PRN

## 2020-05-09 MED ORDER — LIDOCAINE 2% (20 MG/ML) 5 ML SYRINGE
INTRAMUSCULAR | Status: AC
  Filled 2020-05-09: qty 5

## 2020-05-09 MED ORDER — ACETAMINOPHEN 650 MG RE SUPP
650.0000 mg | RECTAL | Status: DC | PRN
  Filled 2020-05-09: qty 1

## 2020-05-09 MED ORDER — FENTANYL CITRATE (PF) 100 MCG/2ML IJ SOLN: 25.0000 ug | INTRAMUSCULAR | Status: DC | PRN

## 2020-05-09 MED ORDER — FENTANYL CITRATE (PF) 100 MCG/2ML IJ SOLN
INTRAMUSCULAR | Status: DC | PRN
  Administered 2020-05-09: 50 ug via INTRAVENOUS
  Administered 2020-05-09 (×2): 25 ug via INTRAVENOUS

## 2020-05-09 MED ORDER — SODIUM CHLORIDE 0.9 % IR SOLN
Status: DC | PRN
  Administered 2020-05-09: 3000 mL

## 2020-05-09 MED ORDER — OXYCODONE HCL 5 MG/5ML PO SOLN: 5.0000 mg | Freq: Once | ORAL | Status: DC | PRN

## 2020-05-09 MED ORDER — DEXAMETHASONE SODIUM PHOSPHATE 10 MG/ML IJ SOLN
INTRAMUSCULAR | Status: DC | PRN
  Administered 2020-05-09: 5 mg via INTRAVENOUS

## 2020-05-09 MED ORDER — PROPOFOL 10 MG/ML IV BOLUS
INTRAVENOUS | Status: AC
  Filled 2020-05-09: qty 20

## 2020-05-09 MED ORDER — SODIUM CHLORIDE 0.9% FLUSH: 3.0000 mL | INTRAVENOUS | Status: DC | PRN

## 2020-05-09 MED ORDER — SODIUM CHLORIDE 0.9 % IV SOLN: INTRAVENOUS | Status: DC

## 2020-05-09 MED ORDER — DEXAMETHASONE SODIUM PHOSPHATE 10 MG/ML IJ SOLN
INTRAMUSCULAR | Status: AC
  Filled 2020-05-09: qty 1

## 2020-05-09 MED ORDER — CEFAZOLIN SODIUM-DEXTROSE 2-4 GM/100ML-% IV SOLN
2.0000 g | INTRAVENOUS | Status: AC
  Administered 2020-05-09: 2 g via INTRAVENOUS
  Filled 2020-05-09: qty 100

## 2020-05-09 MED ORDER — ONDANSETRON HCL 4 MG/2ML IJ SOLN
INTRAMUSCULAR | Status: AC
  Filled 2020-05-09: qty 2

## 2020-05-09 MED ORDER — ORAL CARE MOUTH RINSE: 15.0000 mL | Freq: Once | OROMUCOSAL | Status: AC

## 2020-05-09 MED ORDER — ACETAMINOPHEN 500 MG PO TABS: 1000.0000 mg | ORAL_TABLET | Freq: Once | ORAL | Status: DC

## 2020-05-09 MED ORDER — PHENYLEPHRINE 40 MCG/ML (10ML) SYRINGE FOR IV PUSH (FOR BLOOD PRESSURE SUPPORT)
PREFILLED_SYRINGE | INTRAVENOUS | Status: DC | PRN
  Administered 2020-05-09 (×2): 120 ug via INTRAVENOUS
  Administered 2020-05-09: 160 ug via INTRAVENOUS

## 2020-05-09 MED ORDER — HYDROCODONE-ACETAMINOPHEN 5-325 MG PO TABS: 1.0000 | ORAL_TABLET | Freq: Four times a day (QID) | ORAL | 0 refills | Status: DC | PRN

## 2020-05-09 MED ORDER — FENTANYL CITRATE (PF) 100 MCG/2ML IJ SOLN
INTRAMUSCULAR | Status: AC
  Filled 2020-05-09: qty 2

## 2020-05-09 MED ORDER — ONDANSETRON HCL 4 MG/2ML IJ SOLN
INTRAMUSCULAR | Status: DC | PRN
  Administered 2020-05-09: 4 mg via INTRAVENOUS

## 2020-05-09 MED ORDER — LIDOCAINE 2% (20 MG/ML) 5 ML SYRINGE
INTRAMUSCULAR | Status: DC | PRN
  Administered 2020-05-09: 100 mg via INTRAVENOUS

## 2020-05-09 MED ORDER — LIDOCAINE HCL URETHRAL/MUCOSAL 2 % EX GEL
CUTANEOUS | Status: AC
  Filled 2020-05-09: qty 30

## 2020-05-09 MED ORDER — ACETAMINOPHEN 325 MG PO TABS: 650.0000 mg | ORAL_TABLET | ORAL | Status: DC | PRN

## 2020-05-09 MED ORDER — SODIUM CHLORIDE 0.9% FLUSH: 3.0000 mL | Freq: Two times a day (BID) | INTRAVENOUS | Status: DC

## 2020-05-09 MED ORDER — METOPROLOL TARTRATE 25 MG PO TABS
25.0000 mg | ORAL_TABLET | Freq: Once | ORAL | Status: AC
  Administered 2020-05-09: 25 mg via ORAL
  Filled 2020-05-09: qty 1

## 2020-05-09 MED ORDER — PHENYLEPHRINE 40 MCG/ML (10ML) SYRINGE FOR IV PUSH (FOR BLOOD PRESSURE SUPPORT)
PREFILLED_SYRINGE | INTRAVENOUS | Status: AC
  Filled 2020-05-09: qty 10

## 2020-05-09 MED ORDER — PROPOFOL 10 MG/ML IV BOLUS
INTRAVENOUS | Status: DC | PRN
  Administered 2020-05-09: 140 mg via INTRAVENOUS

## 2020-05-09 MED ORDER — SODIUM CHLORIDE 0.9 % IV SOLN: 250.0000 mL | INTRAVENOUS | Status: DC | PRN

## 2020-05-09 MED ORDER — CHLORHEXIDINE GLUCONATE 0.12 % MT SOLN
15.0000 mL | Freq: Once | OROMUCOSAL | Status: AC
  Administered 2020-05-09: 15 mL via OROMUCOSAL

## 2020-05-09 SURGICAL SUPPLY — 19 items
BAG DRN RND TRDRP ANRFLXCHMBR (UROLOGICAL SUPPLIES) ×1
BAG URINE DRAIN 2000ML AR STRL (UROLOGICAL SUPPLIES) ×3 IMPLANT
BAG URO CATCHER STRL LF (MISCELLANEOUS) ×3 IMPLANT
CATH FOLEY 2WAY SLVR 30CC 22FR (CATHETERS) ×3 IMPLANT
CATH FOLEY 3WAY 30CC 22FR (CATHETERS) IMPLANT
ELECT REM PT RETURN 15FT ADLT (MISCELLANEOUS) ×3 IMPLANT
GLOVE SURG SS PI 8.0 STRL IVOR (GLOVE) ×3 IMPLANT
GOWN STRL REUS W/TWL XL LVL3 (GOWN DISPOSABLE) ×3 IMPLANT
HOLDER FOLEY CATH W/STRAP (MISCELLANEOUS) IMPLANT
KIT TURNOVER KIT A (KITS) IMPLANT
LOOP CUT BIPOLAR 24F LRG (ELECTROSURGICAL) IMPLANT
MANIFOLD NEPTUNE II (INSTRUMENTS) ×3 IMPLANT
PACK CYSTO (CUSTOM PROCEDURE TRAY) ×3 IMPLANT
SYR 30ML LL (SYRINGE) ×3 IMPLANT
SYR TOOMEY IRRIG 70ML (MISCELLANEOUS) ×3
SYRINGE TOOMEY IRRIG 70ML (MISCELLANEOUS) ×1 IMPLANT
TUBING CONNECTING 10 (TUBING) ×2 IMPLANT
TUBING CONNECTING 10' (TUBING) ×1
TUBING UROLOGY SET (TUBING) ×3 IMPLANT

## 2020-05-09 NOTE — Anesthesia Procedure Notes (Signed)
Procedure Name: LMA Insertion Date/Time: 05/09/2020 10:28 AM Performed by: Talbot Grumbling, CRNA Pre-anesthesia Checklist: Patient identified, Emergency Drugs available, Suction available and Patient being monitored Patient Re-evaluated:Patient Re-evaluated prior to induction Oxygen Delivery Method: Circle system utilized Preoxygenation: Pre-oxygenation with 100% oxygen Induction Type: IV induction Ventilation: Mask ventilation without difficulty LMA: LMA inserted LMA Size: 4.0 Number of attempts: 1 Placement Confirmation: positive ETCO2 and breath sounds checked- equal and bilateral Tube secured with: Tape Dental Injury: Teeth and Oropharynx as per pre-operative assessment

## 2020-05-09 NOTE — Anesthesia Postprocedure Evaluation (Signed)
Anesthesia Post Note  Patient: Victor Schultz  Procedure(s) Performed: TRANSURETHRAL INCISION OF THE PROSTATE (TUIP) (N/A )     Patient location during evaluation: PACU Anesthesia Type: General Level of consciousness: awake and alert and oriented Pain management: pain level controlled Vital Signs Assessment: post-procedure vital signs reviewed and stable Respiratory status: spontaneous breathing, nonlabored ventilation and respiratory function stable Cardiovascular status: blood pressure returned to baseline Postop Assessment: no apparent nausea or vomiting Anesthetic complications: no   No complications documented.  Last Vitals:  Vitals:   05/09/20 1200 05/09/20 1216  BP: (!) 163/82 (!) 159/87  Pulse: 69 77  Resp: 19   Temp: (!) 36.4 C   SpO2: 98% 100%    Last Pain:  Vitals:   05/09/20 1200  TempSrc:   PainSc: 0-No pain                 Brennan Bailey

## 2020-05-09 NOTE — Discharge Instructions (Signed)
Transurethral Incision of the Prostate, Care After This sheet gives you information about how to care for yourself after your procedure. Your health care provider may also give you more specific instructions. If you have problems or questions, contact your health care provider. What can I expect after the procedure? After the procedure, it is common to have:  Mild pain in your lower abdomen.  Soreness or mild discomfort in your penis from having the catheter inserted during the procedure.  A feeling of urgency when you need to urinate.  A small amount of blood in your urine. You may notice some small blood clots in your urine. These are normal. Follow these instructions at home: Medicines  Take over-the-counter and prescription medicines only as told by your health care provider.  If you were prescribed an antibiotic medicine, take it as told by your health care provider. Do not stop taking the antibiotic even if you start to feel better.  Ask your health care provider if the medicine prescribed to you: ? Requires you to avoid driving or using heavy machinery. ? Can cause constipation. You may need to take actions to prevent or treat constipation, such as:  Take over-the-counter or prescription medicines.  Eat foods that are high in fiber, such as fresh fruits and vegetables, whole grains, and beans.  Limit foods that are high in fat and processed sugars, such as fried or sweet foods.  Do not drive for 24 hours if you were given a sedative during your procedure. Activity   Return to your normal activities as told by your health care provider. Ask your health care provider what activities are safe for you.  Do not lift anything that is heavier than 10 lb (4.5 kg), or the limit that you are told, for 3 weeks after the procedure or until your health care provider says that it is safe.  Avoid intense physical activity for as long as told by your health care provider.  Avoid sitting  for a long time without moving. Get up and move around one or more times every few hours. This helps to prevent blood clots. You may increase your physical activity gradually as you start to feel better. Lifestyle  Do not drink alcohol for as long as told by your health care provider. This is especially important if you are taking prescription pain medicines.  Do not engage in sexual activity until your health care provider says that you can do this. General instructions   Do not take baths, swim, or use a hot tub until your health care provider approves.  Drink enough fluid to keep your urine pale yellow.  Urinate as soon as you feel the need to. Do not try to hold your urine for long periods of time.  If your health care provider approves, you may take a stool softener for 2-3 weeks to prevent you from straining to have a bowel movement.  Wear compression stockings as told by your health care provider. These stockings help to prevent blood clots and reduce swelling in your legs.  Keep all follow-up visits as told by your health care provider. This is important. Contact a health care provider if you have:  Difficulty urinating.  A fever.  Pain that gets worse or does not improve with medicine.  Blood in your urine that does not go away after 1 week of resting and drinking more fluids.  Swelling in your penis or testicles. Get help right away if:  You are unable  to urinate.  You are having more blood clots in your urine instead of fewer.  You have: ? Large blood clots. ? A lot of blood in your urine. ? Pain in your back or lower abdomen. ? Pain or swelling in your legs. ? Chills and you are shaking. ? Difficulty breathing or shortness of breath. Summary  After the procedure, it is common to have a small amount of blood in your urine.  Avoid heavy lifting and intense physical activity for as long as told by your health care provider.  Urinate as soon as you feel the  need to. Do not try to hold your urine for long periods of time.  Keep all follow-up visits as told by your health care provider. This is important.  You may removed the catheter on Thursday morning by cutting off the side arm.  A small amount of fluid with drain out and then the catheter should just slide out.  If you don't feel you can do that, please call the office to come in for catheter removal.   This information is not intended to replace advice given to you by your health care provider. Make sure you discuss any questions you have with your health care provider. Document Revised: 01/13/2019 Document Reviewed: 06/24/2018 Elsevier Patient Education  [place].

## 2020-05-09 NOTE — H&P (Signed)
I feel that I am not completely emptying my bladder when I urinate.     Victor Schultz returns today in f/u from [facility] for cystoscopy to complete the evaluation of his voiding symptoms and elevated PVR.   UDS SUMMARY  Victor Schultz held a max capacity of approx. 610 mls. His 1st sensation was felt at 394 mls. No instability was noted during the study. He tried for a long time to void while seated but was not able to do so. Once he stood up, he was able to generate a voluntary contraction and void. Intermittent increase in EMG activity was noted during voiding. PVR was approx. 229 mls. Bilateral reflux was noted.    GU Hx: Victor Schultz is a 80 male who has a history of a recent GI bleed in late March. he was found to have a PVR of 347ml and a foley was placed. The foley was removed about a week later. He was started on tamsulosin in the hospital. He has had one prior episode of retention. He has a history of ESRD with 2 prior transplants and he currently has CKD 4 and isn't back on dialysis. He is voiding ok now with an IPSS of 8. he has nocturia x 2 and some urgency. He has no dysuria or hematuria. He has no history of stones or UTI's. He is still weak with the anemia but has no other complaints.    ALLERGIES: None   MEDICATIONS: Metoprolol Tartrate 25 mg tablet  Tamsulosin Hcl 0.4 mg capsule  Atorvastatin Calcium 10 mg tablet  Azathioprine 50 mg tablet  Lantus Solostar 100 unit/ml (3 ml) insulin pen  Pantoprazole Sodium 40 mg tablet, delayed release  Prednisone 5 mg tablet  Sodium Bicarbonate 650 mg tablet  Synthroid 50 mcg tablet  Vitamin D2 1,250 mcg (50,000 unit) capsule     GU PSH: Complex cystometrogram, w/ void pressure and urethral pressure profile studies, any technique - 04/05/2020 Complex Uroflow - 04/05/2020 Emg surf Electrd - 04/05/2020 Inject For cystogram - 04/05/2020 Intrabd voidng Press - 04/05/2020       PSH Notes: bypass 2014   NON-GU PSH: CABG (coronary artery bypass  grafting) Cataract surgery Dental Surgery Procedure Transplant Kidney - about 1982     GU PMH: BPH w/LUTS, He has moderate LUTS with an elevated PVR on tamsulosin. I will get him set up for urodynamics because of his complicated history and then he will return for possible cystoscopy. - 02/21/2020 Incomplete bladder emptying - 02/21/2020 Urinary Urgency - 02/21/2020 Kidney Failure Unspec      PMH Notes: stomach ulcer   NON-GU PMH: Diabetes Type 2 Hepatitis C    FAMILY HISTORY: 1 son - Other   SOCIAL HISTORY: Marital Status: Single Preferred Language: English Current Smoking Status: Patient smokes. Has smoked since 02/05/1995. Smokes 1/2 pack per day.   Tobacco Use Assessment Completed: Used Tobacco in last 30 days? Does not use smokeless tobacco. Has never drank.  Does not use drugs. Does not drink caffeine. Patient's occupation is/was retired.    REVIEW OF SYSTEMS:    GU Review Male:   Patient reports frequent urination, hard to postpone urination, and get up at night to urinate. Patient denies burning/ pain with urination, leakage of urine, stream starts and stops, trouble starting your stream, have to strain to urinate , erection problems, and penile pain.  Gastrointestinal (Upper):   Patient denies nausea, vomiting, and indigestion/ heartburn.  Gastrointestinal (Lower):   Patient denies diarrhea and constipation.  Constitutional:   Patient denies fever, night sweats, weight loss, and fatigue.  Skin:   Patient denies skin rash/ lesion and itching.  Eyes:   Patient denies blurred vision and double vision.  Ears/ Nose/ Throat:   Patient denies sore throat and sinus problems.  Hematologic/Lymphatic:   Patient denies swollen glands and easy bruising.  Cardiovascular:   Patient denies leg swelling and chest pains.  Respiratory:   Patient denies shortness of breath and cough.  Endocrine:   Patient denies excessive thirst.  Musculoskeletal:   Patient denies back pain and joint  pain.  Neurological:   Patient denies headaches and dizziness.  Psychologic:   Patient denies depression and anxiety.   VITAL SIGNS:      04/13/2020 03:08 PM  Weight 168 lb / 76.2 kg  Height 64 in / 162.56 cm  BP 176/79 mmHg  Heart Rate 71 /min  Temperature 98.4 F / 36.8 C  BMI 28.8 kg/m   Complexity of Data:  Records Review:   AUA Symptom Score, Previous Patient Records  Urine Test Review:   Urinalysis  Urodynamics Review:   Review Urodynamics Tests   PROCEDURES:         Flexible Cystoscopy - 52000  Risks, benefits, and some of the potential complications of the procedure were discussed. 100ml of 2% lidocaine jelly was instilled intraurethrally.  Cipro 500mg  given for antibiotic prophylaxis.     Meatus:  Normal size. Normal location. Normal condition.  Urethra:  No strictures.  External Sphincter:  Normal.  Verumontanum:  Normal.  Prostate:  Obstructing. Mild hyperplasia. tight bladder neck  Bladder Neck:  Non-obstructing.  Ureteral Orifices:  Normal location. Normal size. Normal shape. I didn't see the transplant UO's.   Bladder:  Moderate trabeculation. No tumors. Normal mucosa. No stones.      The procedure was well tolerated and there were no complications.         Urinalysis w/Scope Dipstick Dipstick Cont'd Micro  Color: Yellow Bilirubin: Neg mg/dL WBC/hpf: NS (Not Seen)  Appearance: Clear Ketones: Neg mg/dL RBC/hpf: 3 - 10/hpf  Specific Gravity: 1.025 Blood: 1+ ery/uL Bacteria: NS (Not Seen)  pH: 6.0 Protein: 3+ mg/dL Cystals: NS (Not Seen)  Glucose: Trace mg/dL Urobilinogen: 0.2 mg/dL Casts: NS (Not Seen)    Nitrites: Neg Trichomonas: Not Present    Leukocyte Esterase: Neg leu/uL Mucous: Not Present      Epithelial Cells: NS (Not Seen)      Yeast: NS (Not Seen)      Sperm: Not Present    Notes: microscopic not concentrated.    ASSESSMENT:      ICD-10 Details  1 GU:   BPH w/LUTS - N40.1 Chronic, Stable - He has a tight bladder neck with LUTS and an  elevated PVR on tamsulosin. I discussed options and feel he will be best served with a TUIP. I reviewd the risks of a TUIP including bleeding, infection, incontinence, stricture, need for secondary procedures, ejaculatory and erectile dysfunction, thrombotic events, fluid overload and anesthetic complications. I explained that 95% of men will have relief of the obstructive symptoms and about 70% will have relief of the irritative symptoms.   2   Incomplete bladder emptying - R39.14 Chronic, Stable  3   Urinary Urgency - R39.15 Chronic, Stable     PLAN:           Schedule Return Visit/Planned Activity: Next Available Appointment - Schedule Surgery  Procedure: Unspecified Date - Cystoscopy TUIP - 56387 Notes: Next available

## 2020-05-09 NOTE — Transfer of Care (Signed)
Immediate Anesthesia Transfer of Care Note  Patient: Victor Schultz  Procedure(s) Performed: TRANSURETHRAL INCISION OF THE PROSTATE (TUIP) (N/A )  Patient Location: PACU  Anesthesia Type:General  Level of Consciousness: sedated  Airway & Oxygen Therapy: Patient Spontanous Breathing and Patient connected to face mask oxygen  Post-op Assessment: Report given to RN and Post -op Vital signs reviewed and stable  Post vital signs: Reviewed and stable  Last Vitals:  Vitals Value Taken Time  BP    Temp    Pulse 72 05/09/20 1104  Resp 14 05/09/20 1104  SpO2 100 % 05/09/20 1104  Vitals shown include unvalidated device data.  Last Pain:  Vitals:   05/09/20 0800  TempSrc: Oral  PainSc:       Patients Stated Pain Goal: 4 (59/74/16 3845)  Complications: No complications documented.

## 2020-05-09 NOTE — Op Note (Signed)
Procedure: Cystoscopy with transurethral incision of the prostate.  Preop diagnosis: Bladder outlet obstruction.  Postop diagnosis: Same.  Surgeon: Dr. Irine Seal.  Anesthesia: General.  Drain: 38 French Foley catheter.  Specimen: None.  EBL: None.  Complications: None.  Indications: The patient is a 69 year old male with the below bladder outlet obstruction with a small prostate but tight bladder neck who is to undergo transurethral incision of the prostate.  Procedure: He was given antibiotics.  A general anesthetic was induced.  Was placed in lithotomy position and fitted with PAS hose.  His perineum and genitalia were prepped with Betadine solution he was draped in usual sterile fashion.  Cystoscopy was performed using 22 [place] scope and 30 degree lens.  Examination revealed a normal urethra.  The external sphincter was intact.  The prostatic urethra short with minimal lateral lobe hyperplasia.  There was a small amount of median lobe hyperplasia with a tight bladder neck and obstruction.  Examination of bladder revealed mild trabeculation without mucosal lesions.  There was a bandlike adhesion on the right posterior bladder wall that was approximately 3 to 4 cm in length but benign in appearance.  Ureteral orifices were somewhat laterally displaced and open mouth in appearance suggestive of a neuropathic bladder.  The urethra was then calibrated to 32 [place] with [employer] and a 26 French continuous-flow resectoscope sheath was inserted.  This was fitted with a Beatrix Fetters handle with a Collins knife and 30 degree lens.  Saline was used the irrigant.  The initial incision of the bladder neck was performed at the 5 o'clock position and was carried down from just distal to the ureteral orifice to just proximal to the verumontanum into the fat in the bladder neck.  A second incision was made in an alkaline fashion at the 7 o'clock position.  This provided very widely patent bladder  neck.  Hemostasis was achieved and once no bleeding was noted, the scope was removed and pressure on the bladder produced an excellent stream.  A 22 French Foley catheter was inserted with the aid of a catheter guide.  The balloon was filled with 30 cc of sterile fluid and the cath was irrigated with clear return been placed to straight drainage.  He was taken down from lithotomy position, his anesthetic was reversed and he was moved recovery in stable condition.  There were no complications.

## 2020-05-10 ENCOUNTER — Encounter (HOSPITAL_COMMUNITY): Payer: Self-pay | Admitting: Urology

## 2020-05-10 ENCOUNTER — Ambulatory Visit: Payer: Self-pay | Admitting: Legal Medicine

## 2020-05-20 ENCOUNTER — Other Ambulatory Visit (HOSPITAL_COMMUNITY)
Admission: RE | Admit: 2020-05-20 | Discharge: 2020-05-20 | Disposition: A | Discharge status: Home / Self Care | Payer: Medicare Other | Source: Ambulatory Visit | Attending: Gastroenterology | Admitting: Gastroenterology

## 2020-05-20 DIAGNOSIS — Z20822 Contact with and (suspected) exposure to covid-19: Secondary | ICD-10-CM | POA: Diagnosis not present

## 2020-05-20 DIAGNOSIS — Z01812 Encounter for preprocedural laboratory examination: Secondary | ICD-10-CM | POA: Diagnosis not present

## 2020-05-20 LAB — SARS CORONAVIRUS 2 (TAT 6-24 HRS): SARS Coronavirus 2: NEGATIVE

## 2020-05-24 ENCOUNTER — Other Ambulatory Visit: Payer: Self-pay

## 2020-05-24 ENCOUNTER — Encounter (HOSPITAL_COMMUNITY)
Admission: RE | Disposition: A | Discharge status: Home / Self Care | Payer: Self-pay | Source: Home / Self Care | Attending: Gastroenterology

## 2020-05-24 ENCOUNTER — Ambulatory Visit (HOSPITAL_COMMUNITY)
Admission: RE | Admit: 2020-05-24 | Discharge: 2020-05-24 | Disposition: A | Discharge status: Home / Self Care | Payer: Medicare Other | Attending: Gastroenterology | Admitting: Gastroenterology

## 2020-05-24 ENCOUNTER — Ambulatory Visit (HOSPITAL_COMMUNITY): Payer: Medicare Other | Admitting: Certified Registered Nurse Anesthetist

## 2020-05-24 ENCOUNTER — Encounter (HOSPITAL_COMMUNITY): Payer: Self-pay | Admitting: Gastroenterology

## 2020-05-24 DIAGNOSIS — Z79890 Hormone replacement therapy (postmenopausal): Secondary | ICD-10-CM | POA: Diagnosis not present

## 2020-05-24 DIAGNOSIS — Z951 Presence of aortocoronary bypass graft: Secondary | ICD-10-CM | POA: Diagnosis not present

## 2020-05-24 DIAGNOSIS — R12 Heartburn: Secondary | ICD-10-CM | POA: Insufficient documentation

## 2020-05-24 DIAGNOSIS — I120 Hypertensive chronic kidney disease with stage 5 chronic kidney disease or end stage renal disease: Secondary | ICD-10-CM | POA: Insufficient documentation

## 2020-05-24 DIAGNOSIS — Z940 Kidney transplant status: Secondary | ICD-10-CM | POA: Diagnosis not present

## 2020-05-24 DIAGNOSIS — K449 Diaphragmatic hernia without obstruction or gangrene: Secondary | ICD-10-CM | POA: Diagnosis not present

## 2020-05-24 DIAGNOSIS — I129 Hypertensive chronic kidney disease with stage 1 through stage 4 chronic kidney disease, or unspecified chronic kidney disease: Secondary | ICD-10-CM | POA: Diagnosis not present

## 2020-05-24 DIAGNOSIS — K219 Gastro-esophageal reflux disease without esophagitis: Secondary | ICD-10-CM | POA: Diagnosis not present

## 2020-05-24 DIAGNOSIS — E1122 Type 2 diabetes mellitus with diabetic chronic kidney disease: Secondary | ICD-10-CM | POA: Diagnosis not present

## 2020-05-24 DIAGNOSIS — K228 Other specified diseases of esophagus: Secondary | ICD-10-CM | POA: Insufficient documentation

## 2020-05-24 DIAGNOSIS — K2100 Gastro-esophageal reflux disease with esophagitis, without bleeding: Secondary | ICD-10-CM | POA: Insufficient documentation

## 2020-05-24 DIAGNOSIS — K2210 Ulcer of esophagus without bleeding: Secondary | ICD-10-CM | POA: Diagnosis not present

## 2020-05-24 DIAGNOSIS — Z794 Long term (current) use of insulin: Secondary | ICD-10-CM | POA: Insufficient documentation

## 2020-05-24 DIAGNOSIS — E785 Hyperlipidemia, unspecified: Secondary | ICD-10-CM | POA: Diagnosis not present

## 2020-05-24 DIAGNOSIS — I2510 Atherosclerotic heart disease of native coronary artery without angina pectoris: Secondary | ICD-10-CM | POA: Insufficient documentation

## 2020-05-24 DIAGNOSIS — Z992 Dependence on renal dialysis: Secondary | ICD-10-CM | POA: Insufficient documentation

## 2020-05-24 DIAGNOSIS — Z79899 Other long term (current) drug therapy: Secondary | ICD-10-CM | POA: Insufficient documentation

## 2020-05-24 DIAGNOSIS — F17200 Nicotine dependence, unspecified, uncomplicated: Secondary | ICD-10-CM | POA: Insufficient documentation

## 2020-05-24 DIAGNOSIS — N186 End stage renal disease: Secondary | ICD-10-CM | POA: Diagnosis not present

## 2020-05-24 DIAGNOSIS — E039 Hypothyroidism, unspecified: Secondary | ICD-10-CM | POA: Diagnosis not present

## 2020-05-24 DIAGNOSIS — N184 Chronic kidney disease, stage 4 (severe): Secondary | ICD-10-CM | POA: Diagnosis not present

## 2020-05-24 DIAGNOSIS — K2270 Barrett's esophagus without dysplasia: Secondary | ICD-10-CM | POA: Diagnosis not present

## 2020-05-24 HISTORY — PX: ESOPHAGOGASTRODUODENOSCOPY (EGD) WITH PROPOFOL: SHX5813

## 2020-05-24 HISTORY — PX: BIOPSY: SHX5522

## 2020-05-24 LAB — POCT I-STAT EG7
Acid-base deficit: 6 mmol/L — ABNORMAL HIGH (ref 0.0–2.0)
Bicarbonate: 20 mmol/L (ref 20.0–28.0)
Calcium, Ion: 1.19 mmol/L (ref 1.15–1.40)
HCT: 26 % — ABNORMAL LOW (ref 39.0–52.0)
Hemoglobin: 8.8 g/dL — ABNORMAL LOW (ref 13.0–17.0)
O2 Saturation: 69 %
Potassium: 4.9 mmol/L (ref 3.5–5.1)
Sodium: 145 mmol/L (ref 135–145)
TCO2: 21 mmol/L — ABNORMAL LOW (ref 22–32)
pCO2, Ven: 41.6 mmHg — ABNORMAL LOW (ref 44.0–60.0)
pH, Ven: 7.289 (ref 7.250–7.430)
pO2, Ven: 40 mmHg (ref 32.0–45.0)

## 2020-05-24 LAB — GLUCOSE, CAPILLARY
Glucose-Capillary: 74 mg/dL (ref 70–99)
Glucose-Capillary: 66 mg/dL — ABNORMAL LOW (ref 70–99)
Glucose-Capillary: 96 mg/dL (ref 70–99)

## 2020-05-24 SURGERY — ESOPHAGOGASTRODUODENOSCOPY (EGD) WITH PROPOFOL
Anesthesia: Monitor Anesthesia Care

## 2020-05-24 MED ORDER — SODIUM CHLORIDE 0.9 % IV SOLN
INTRAVENOUS | Status: DC
  Administered 2020-05-24: 500 mL via INTRAVENOUS

## 2020-05-24 MED ORDER — PROPOFOL 10 MG/ML IV BOLUS
INTRAVENOUS | Status: DC | PRN
  Administered 2020-05-24: 10 mg via INTRAVENOUS
  Administered 2020-05-24: 20 mg via INTRAVENOUS
  Administered 2020-05-24: 30 mg via INTRAVENOUS

## 2020-05-24 MED ORDER — LACTATED RINGERS IV SOLN: INTRAVENOUS | Status: DC

## 2020-05-24 MED ORDER — PROPOFOL 500 MG/50ML IV EMUL
INTRAVENOUS | Status: AC
  Filled 2020-05-24: qty 50

## 2020-05-24 MED ORDER — PROPOFOL 500 MG/50ML IV EMUL
INTRAVENOUS | Status: DC | PRN
  Administered 2020-05-24: 100 ug/kg/min via INTRAVENOUS

## 2020-05-24 SURGICAL SUPPLY — 14 items

## 2020-05-24 NOTE — Op Note (Signed)
[facility] Patient Name: Victor Schultz Procedure Date: 05/24/2020 MRN: [id number] Attending MD: [name] , MD Date of Birth: 1951/07/05 CSN: [id number] Age: 69 Admit Type: Outpatient Procedure:                Upper GI endoscopy Indications:              Heartburn, Follow-up of reflux esophagitis Providers:                [name], MD, Cleda Daub, RN, Tyna Jaksch Technician Referring MD:              Medicines:                Monitored Anesthesia Care Complications:            No immediate complications. Estimated Blood Loss:     Estimated blood loss: none. Procedure:                Pre-Anesthesia Assessment:                           - Prior to the procedure, a History and Physical                            was performed, and patient medications and                            allergies were reviewed. The patient's tolerance of                            previous anesthesia was also reviewed. The risks                            and benefits of the procedure and the sedation                            options and risks were discussed with the patient.                            All questions were answered, and informed consent                            was obtained. Prior Anticoagulants: The patient has                            taken no previous anticoagulant or antiplatelet                            agents. ASA Grade Assessment: III - A patient with                            severe systemic disease. After reviewing the risks  and benefits, the patient was deemed in                            satisfactory condition to undergo the procedure.                           After obtaining informed consent, the endoscope was                            passed under direct vision. Throughout the                            procedure, the patient's blood pressure, pulse, and                             oxygen saturations were monitored continuously. The                            GIF-H190 ([id number]) Olympus gastroscope was                            introduced through the mouth, and advanced to the                            second part of duodenum. The upper GI endoscopy was                            accomplished without difficulty. The patient                            tolerated the procedure well. Scope In: Scope Out: Findings:      There were esophageal mucosal changes suspicious for long-segment       Barrett's esophagus present in the lower third of the esophagus.       Barrett's was continuous segment from 24cm to 32cm from the incisors; no       ulceration, mass, nodularity was seen. No esophagitis seen. Mucosa was       biopsied with a cold forceps for histology. A total of 3 specimen       bottles were sent to pathology (25cm, 28cm, 31cm).      A 3 cm hiatal hernia was present, extending 33cm to 36cm rom the       incisors.      The exam of the esophagus was otherwise normal.      The entire examined stomach was normal.      The duodenal bulb, first portion of the duodenum and second portion of       the duodenum were normal. Impression:               - Esophageal mucosal changes suspicious for                            long-segment Barrett's esophagus, C8M8, biopsied.                           - 3 cm hiatal hernia.                           -  Normal stomach.                           - Normal duodenal bulb, first portion of the                            duodenum and second portion of the duodenum. Moderate Sedation:      None Recommendation:           - Discharge patient to home (via wheelchair).                           - Resume previous diet today.                           - Continue present medications.                           - Use Protonix (pantoprazole) 40 mg PO daily                            indefinitely.                           - Await pathology  results.                           - Return to GI clinic in 3 months.                           - Return to referring physician as previously                            scheduled. Procedure Code(s):        --- Professional ---                           [phone], Esophagogastroduodenoscopy, flexible,                            transoral; with biopsy, single or multiple Diagnosis Code(s):        --- Professional ---                           K22.8, Other specified diseases of esophagus                           K44.9, Diaphragmatic hernia without obstruction or                            gangrene                           R12, Heartburn                           K21.00, Gastro-esophageal reflux disease with  esophagitis, without bleeding CPT copyright 2019 American Medical Association. All rights reserved. The codes documented in this report are preliminary and upon coder review may  be revised to meet current compliance requirements. [name], MD 05/24/2020 9:33:41 AM This report has been signed electronically. Number of Addenda: 0

## 2020-05-24 NOTE — Discharge Instructions (Signed)

## 2020-05-24 NOTE — Progress Notes (Signed)
Hypoglycemic Event  CBG: 66  Treatment: 4 oz juice/soda  Symptoms: None  Follow-up CBG: Time:*0958** CBG Result:**96*  Possible Reasons for Event: Inadequate meal intake  Comments/MD notified:yes    Victor Schultz

## 2020-05-24 NOTE — Anesthesia Postprocedure Evaluation (Signed)
Anesthesia Post Note  Patient: Victor Schultz  Procedure(s) Performed: ESOPHAGOGASTRODUODENOSCOPY (EGD) WITH PROPOFOL (N/A ) BIOPSY     Patient location during evaluation: Endoscopy Anesthesia Type: MAC Level of consciousness: awake and alert Pain management: pain level controlled Vital Signs Assessment: post-procedure vital signs reviewed and stable Respiratory status: spontaneous breathing, nonlabored ventilation, respiratory function stable and patient connected to nasal cannula oxygen Cardiovascular status: blood pressure returned to baseline and stable Postop Assessment: no apparent nausea or vomiting Anesthetic complications: no   No complications documented.  Last Vitals:  Vitals:   05/24/20 1000 05/24/20 1010  BP: (!) 193/88 (!) 198/84  Pulse: 73 70  Resp: (!) 21 (!) 21  Temp:    SpO2: 100% 100%    Last Pain:  Vitals:   05/24/20 1000  TempSrc:   PainSc: 0-No pain                 [name]

## 2020-05-24 NOTE — Anesthesia Preprocedure Evaluation (Addendum)
Anesthesia Evaluation  Patient identified by MRN, date of birth, ID band Patient awake    Reviewed: Allergy & Precautions, NPO status , Patient's Chart, lab work & pertinent test results, reviewed documented beta blocker date and time   History of Anesthesia Complications Negative for: history of anesthetic complications  Airway Mallampati: III  TM Distance: >3 FB Neck ROM: Full    Dental  (+) Edentulous Lower, Edentulous Upper   Pulmonary neg pulmonary ROS, Current Smoker and Patient abstained from smoking.,    Pulmonary exam normal        Cardiovascular hypertension, Pt. on home beta blockers and Pt. on medications + CAD and + CABG  Normal cardiovascular exam  HLD   Neuro/Psych negative neurological ROS  negative psych ROS   GI/Hepatic Neg liver ROS, GERD  Controlled and Medicated,  Endo/Other  diabetes, Type 2, Insulin DependentHypothyroidism   Renal/GU Dialysis, ESRF and Renal InsufficiencyRenal disease (taking prednisone 26m qd, K 4.9)S/p kidney transplant (1974 and 1982) with recurrent advanced CKD  negative genitourinary   Musculoskeletal negative musculoskeletal ROS (+)   Abdominal   Peds  Hematology  (+) Blood dyscrasia (Hgb 8.8), anemia ,   Anesthesia Other Findings   Reproductive/Obstetrics negative OB ROS                          Anesthesia Physical  Anesthesia Plan  ASA: III  Anesthesia Plan: MAC   Post-op Pain Management:    Induction: Intravenous  PONV Risk Score and Plan: 2 and Treatment may vary due to age or medical condition and Propofol infusion  Airway Management Planned: Natural Airway  Additional Equipment: None  Intra-op Plan:   Post-operative Plan:   Informed Consent: I have reviewed the patients History and Physical, chart, labs and discussed the procedure including the risks, benefits and alternatives for the proposed anesthesia with the patient or  authorized representative who has indicated his/her understanding and acceptance.     Dental advisory given  Plan Discussed with: CRNA  Anesthesia Plan Comments:        Anesthesia Quick Evaluation

## 2020-05-24 NOTE — H&P (Signed)
Patient interval history reviewed.  Patient examined again.  There has been no change from documented H/P scanned into chart from our office except as documented above.  Assessment:  1.  History esophagitis. 2.  GERD. 3.  History esophageal ulcers.  Plan:  1.  Endoscopy to follow-up esophagitis. 2.  Risks (bleeding, infection, bowel perforation that could require surgery, sedation-related changes in cardiopulmonary systems), benefits (identification and possible treatment of source of symptoms, exclusion of certain causes of symptoms), and alternatives (watchful waiting, radiographic imaging studies, empiric medical treatment) of upper endoscopy (EGD) were explained to patient/family in detail and patient wishes to proceed.

## 2020-05-24 NOTE — Transfer of Care (Signed)
Immediate Anesthesia Transfer of Care Note  Patient: Victor Schultz  Procedure(s) Performed: ESOPHAGOGASTRODUODENOSCOPY (EGD) WITH PROPOFOL (N/A )  Patient Location: Endoscopy Unit  Anesthesia Type:MAC  Level of Consciousness: drowsy  Airway & Oxygen Therapy: Patient Spontanous Breathing and Patient connected to face mask  Post-op Assessment: Report given to RN and Post -op Vital signs reviewed and stable  Post vital signs: Reviewed and stable  Last Vitals:  Vitals Value Taken Time  BP    Temp    Pulse    Resp    SpO2      Last Pain:  Vitals:   05/24/20 0807  TempSrc: Oral  PainSc: 0-No pain         Complications: No complications documented.

## 2020-05-25 DIAGNOSIS — N401 Enlarged prostate with lower urinary tract symptoms: Secondary | ICD-10-CM | POA: Diagnosis not present

## 2020-05-25 DIAGNOSIS — R3914 Feeling of incomplete bladder emptying: Secondary | ICD-10-CM | POA: Diagnosis not present

## 2020-05-25 LAB — SURGICAL PATHOLOGY

## 2020-05-26 ENCOUNTER — Encounter (HOSPITAL_COMMUNITY): Payer: Self-pay | Admitting: Gastroenterology

## 2020-06-01 DIAGNOSIS — E785 Hyperlipidemia, unspecified: Secondary | ICD-10-CM | POA: Diagnosis not present

## 2020-06-01 DIAGNOSIS — E039 Hypothyroidism, unspecified: Secondary | ICD-10-CM | POA: Diagnosis not present

## 2020-06-01 DIAGNOSIS — N184 Chronic kidney disease, stage 4 (severe): Secondary | ICD-10-CM | POA: Diagnosis not present

## 2020-06-01 DIAGNOSIS — E118 Type 2 diabetes mellitus with unspecified complications: Secondary | ICD-10-CM | POA: Diagnosis not present

## 2020-06-06 ENCOUNTER — Ambulatory Visit: Payer: Medicare Other

## 2020-06-06 ENCOUNTER — Encounter: Payer: Self-pay | Admitting: Family Medicine

## 2020-06-06 ENCOUNTER — Other Ambulatory Visit: Payer: Self-pay | Admitting: Legal Medicine

## 2020-06-06 ENCOUNTER — Ambulatory Visit (INDEPENDENT_AMBULATORY_CARE_PROVIDER_SITE_OTHER): Payer: Medicare Other | Admitting: Family Medicine

## 2020-06-06 ENCOUNTER — Other Ambulatory Visit: Payer: Self-pay

## 2020-06-06 VITALS — BP 154/64 | HR 79 | Temp 96.5°F | Resp 17 | Ht 68.0 in | Wt 169.4 lb

## 2020-06-06 DIAGNOSIS — Z0000 Encounter for general adult medical examination without abnormal findings: Secondary | ICD-10-CM | POA: Diagnosis not present

## 2020-06-06 NOTE — Patient Instructions (Addendum)
Consider pneumonia vaccine-will check immunizations  -none noted at Nephrologist Consider flu vaccine-none noted at Nephrologist 3rd dose COVID -pt immuno compromised due to kidney transplant  Eye exam -recommend schedule for evaluation diabetic eye disease and glaucoma  Keep appointment for follow up with [facility] doctor for colonoscopy follow up  Your TSH was 3.3 in March 2021 Your A1c was 6.5% March 2021, 6.4% June 2021 Your labwork was completed by Nephrology in June 2021-[facility] requested a copy to be faxed to be added to your electronic chart  I have attached a form for living will and [place] have this notarized

## 2020-06-06 NOTE — Progress Notes (Signed)
Subjective:   Victor Schultz is a 69 y.o. male who presents for Medicare Annual/Subsequent preventive examination.      Objective:    Today's Vitals   06/06/20 1017  BP: (!) 154/64  Pulse: 79  Resp: 17  Temp: (!) 96.5 F (35.8 C)  SpO2: 97%  Weight: 169 lb 6.4 oz (76.8 kg)  Height: '5\' 8"'  (1.727 m)   Body mass index is 25.76 kg/m.  Advanced Directives 05/24/2020 05/02/2020 12/22/2019  Does Patient Have a Medical Advance Directive? No No No  Would patient like information on creating a medical advance directive? - - No - Patient declined    Current Medications (verified) Outpatient Encounter Medications as of 06/06/2020  Medication Sig  . atorvastatin (LIPITOR) 20 MG tablet Take 20 mg by mouth daily.  [name] azaTHIOprine (IMURAN) 50 MG tablet Take 2.5 tablets (125 mg total) by mouth daily. Taking 1.5 tablets in the am and 1 tablets in the evening. (Patient taking differently: Take 50-75 mg by mouth See admin instructions. Take 75 mg in the morning and 50 mg at night)  . B-D UF III MINI PEN NEEDLES 31G X 5 MM MISC USE WITH LANTUS AS DIRECTED  . LANTUS SOLOSTAR 100 UNIT/ML Solostar Pen Inject 35 Units into the skin at bedtime.   [name] levothyroxine (SYNTHROID) 50 MCG tablet Take 1 tablet (50 mcg total) by mouth daily.  . metoprolol tartrate (LOPRESSOR) 25 MG tablet Take 0.5 tablets (12.5 mg total) by mouth 2 (two) times daily. (Patient taking differently: Take 25 mg by mouth 2 (two) times daily. )  . pantoprazole (PROTONIX) 40 MG tablet Take 1 tablet (40 mg total) by mouth 2 (two) times daily. (Patient taking differently: Take 40 mg by mouth daily. )  . predniSONE (DELTASONE) 5 MG tablet Take 1 tablet (5 mg total) by mouth every other day.  . sodium bicarbonate 650 MG tablet Take 1,300 mg by mouth 2 (two) times daily.   . Vitamin D, Ergocalciferol, (DRISDOL) 1.25 MG (50000 UNIT) CAPS capsule Take 50,000 Units by mouth every Sunday.   . [DISCONTINUED] HYDROcodone-acetaminophen (NORCO/VICODIN)  5-325 MG tablet Take 1 tablet by mouth every 6 (six) hours as needed for moderate pain.  . [DISCONTINUED] tamsulosin (FLOMAX) 0.4 MG CAPS capsule Take 1 capsule (0.4 mg total) by mouth daily after supper.   No facility-administered encounter medications on file as of 06/06/2020.   Allergies (verified) Patient has no known allergies.   History: Past Medical History:  Diagnosis Date  . Atherosclerotic heart disease of native coronary artery with unstable angina pectoris ([place])   . Atrophy of thyroid (acquired)   . Cancer ([place])    skin cancer on both arms.  . Diabetes mellitus without complication ([place])   . History of end stage renal disease   . Hypertensive heart and chronic kidney disease without heart failure, with stage 5 chronic kidney disease, or end stage renal disease ([place])    ESR,kidney transplant  . Hypothyroidism   . Kidney transplant status   . Long term (current) use of insulin ([place])   . Mixed hyperlipidemia    Past Surgical History:  Procedure Laterality Date  . BIOPSY  05/24/2020   Procedure: BIOPSY;  Surgeon: [name], MD;  Location: [facility];  Service: Endoscopy;;  . CATARACT EXTRACTION    . CORONARY ARTERY BYPASS GRAFT    . ESOPHAGOGASTRODUODENOSCOPY (EGD) WITH PROPOFOL Left 12/23/2019   Procedure: ESOPHAGOGASTRODUODENOSCOPY (EGD) WITH PROPOFOL;  Surgeon: [name], MD;  Location: [facility];  Service: Endoscopy;  Laterality: Left;  . ESOPHAGOGASTRODUODENOSCOPY (EGD) WITH PROPOFOL N/A 05/24/2020   Procedure: ESOPHAGOGASTRODUODENOSCOPY (EGD) WITH PROPOFOL;  Surgeon: [name], MD;  Location: [facility];  Service: Endoscopy;  Laterality: N/A;  . KIDNEY TRANSPLANT     1974, 1982  . TRANSURETHRAL INCISION OF PROSTATE N/A 05/09/2020   Procedure: TRANSURETHRAL INCISION OF THE PROSTATE (TUIP);  Surgeon: Irine Seal, MD;  Location: [facility];  Service: Urology;  Laterality: N/A;   Family History  Problem Relation Age of Onset  . Liver disease Mother    . Alzheimer's disease Father    Social History   Socioeconomic History  . Marital status: Divorced    Spouse name: Not on file  . Number of children: 1  . Years of education: Not on file  . Highest education level: Not on file  Occupational History  . Occupation: retired  Tobacco Use  . Smoking status: Current Every Day Smoker    Packs/day: 0.50    Years: 39.00    Pack years: 19.50    Types: Cigarettes  . Smokeless tobacco: Never Used  Vaping Use  . Vaping Use: Never used  Substance and Sexual Activity  . Alcohol use: Never  . Drug use: Never  . Sexual activity: Not Currently  Other Topics Concern  . Not on file  Social History Narrative  . Not on file   Social Determinants of Health   Financial Resource Strain:   . Difficulty of Paying Living Expenses: Not on file  Food Insecurity: No Food Insecurity  . Worried About Charity fundraiser in the Last Year: Never true  . Ran Out of Food in the Last Year: Never true  Transportation Needs: No Transportation Needs  . Lack of Transportation (Medical): No  . Lack of Transportation (Non-Medical): No  Physical Activity:   . Days of Exercise per Week: Not on file  . Minutes of Exercise per Session: Not on file  Stress:   . Feeling of Stress : Not on file  Social Connections: Socially Isolated  . Frequency of Communication with Friends and Family: More than three times a week  . Frequency of Social Gatherings with Friends and Family: Once a week  . Attends Religious Services: Never  . Active Member of Clubs or Organizations: No  . Attends Archivist Meetings: Never  . Marital Status: Divorced    Tobacco Counseling Not interested in quitting Review of Systems  Constitutional: Positive for malaise/fatigue.  HENT: Negative.   Respiratory:       COPD  Cardiovascular:       CAD-bypass  Gastrointestinal:       Anemia-recent EGD -colonoscopy scheduled after evaluation in October  Genitourinary:        Urology with recent dilation Kidney transplant  Skin:       Squamous cell CA-pt declines further derm evaluation  Neurological: Positive for weakness.  Endo/Heme/Allergies: Bruises/bleeds easily.  Psychiatric/Behavioral: Negative.                    Diabetic-yes-endo following-A1c 3/21-6.5%         Activities of Daily Living In your present state of health, do you have any difficulty performing the following activities: 05/02/2020 12/22/2019  Hearing? N N  Vision? N N  Difficulty concentrating or making decisions? N N  Walking or climbing stairs? Y N  Dressing or bathing? N N  Doing errands, shopping? N N  Some recent data might be hidden  Patient Care Team: Lillard Anes, MD as PCP - General (Family Medicine) Burnice Logan, [facility] as Pharmacist (Pharmacist)    Assessment:   This is a routine wellness examination for Victor Schultz.  Hearing/Vision screen No hearing concerns-ringing in the left ear/needs vision exam Dietary issues and exercise activities discussed:  walks when not hot   Depression Screen PHQ 2/9 Scores 01/03/2020  PHQ - 2 Score 0    Fall Risk  Any stairs in or around the home? 3 steps going into house If so, are there any without handrails? yes Home free of loose throw rugs in walkways, pet beds, electrical cords, etc? yes Adequate lighting in your home to reduce risk of falls?  yes ASSISTIVE DEVICES UTILIZED TO PREVENT FALLS:  Life alert? no Use of a cane, walker or w/c? no Grab bars in the bathroom? Yes Shower chair or bench in shower? yes Elevated toilet seat or a handicapped toilet? No  Cognitive Function:   no concerns    Immunizations Immunization History  Administered Date(s) Administered  . Influenza-Unspecified 10/07/2018  per declined pneumo COVID x2-recommend third dose due to transplant  Screening Tests COVID x 2  Foot exam -endo yearly Colonoscopy pending-scheduled after Oct appt with GI per  pt-EGD(cologuard-4/18 negative  SPECIALTY PROVIDERS [name], [place]-Nephrology Dr. Wrenn-urology-bladder dilation Dr. Posey Boyer function test 6/21 Gastro-colonoscopy scheduled-EGD-October appt to evaluate -anemia-colonoscopy to follow Dr. Doer-endocrinology  Lung Cancer Screening:  Pack/day x 40 years Lung Cancer Screening Referral: no-pt declines  Additional Screening: Hepatitis C Screening: disease-noted when pt had bypass surgery-pt declined treatment referral  Vision Screening: Recommended annual ophthalmology exams for early detection of glaucoma and other disorders of the eye. Is the patient up to date with their annual eye exam?  unsure-pt has not had recent exam Who is the provider or what is the name of the office in which the patient attends annual eye exams? Dr. Gilford Rile in the past-cataract surgery in the past If pt is not established with a provider, would they like to be referred to a provider to establish care?  Pt to self refer.   Dental Screening: Recommended annual dental exams for proper oral hygiene-dentures-no difficulty chewing or swallowing   Plan:    I have personally reviewed and noted the following in the patient's chart:   . Medical and social history . Use of alcohol, tobacco or illicit drugs  . Current medications and supplements . Functional ability and status . Nutritional status . Physical activity . Advanced directives . List of other physicians . Hospitalizations, surgeries, and ER visits in previous 12 months . Vitals . Screenings to include cognitive, depression, and falls . Referrals and appointments  In addition, I have reviewed and discussed with patient certain preventive protocols, quality metrics, and best practice recommendations. A written personalized care plan for preventive services as well as general preventive health recommendations were provided to patient.   Mertha Baars, MD   06/06/2020

## 2020-06-08 ENCOUNTER — Ambulatory Visit: Payer: Medicare Other | Admitting: Legal Medicine

## 2020-06-08 ENCOUNTER — Ambulatory Visit: Payer: Medicare Other | Admitting: Nurse Practitioner

## 2020-06-09 ENCOUNTER — Encounter: Payer: Self-pay | Admitting: Legal Medicine

## 2020-06-15 DIAGNOSIS — E7800 Pure hypercholesterolemia, unspecified: Secondary | ICD-10-CM

## 2020-06-15 DIAGNOSIS — E1169 Type 2 diabetes mellitus with other specified complication: Secondary | ICD-10-CM | POA: Diagnosis not present

## 2020-06-15 DIAGNOSIS — E782 Mixed hyperlipidemia: Secondary | ICD-10-CM | POA: Diagnosis not present

## 2020-06-15 DIAGNOSIS — R809 Proteinuria, unspecified: Secondary | ICD-10-CM | POA: Diagnosis not present

## 2020-06-15 DIAGNOSIS — N289 Disorder of kidney and ureter, unspecified: Secondary | ICD-10-CM | POA: Insufficient documentation

## 2020-06-15 DIAGNOSIS — Z940 Kidney transplant status: Secondary | ICD-10-CM | POA: Diagnosis not present

## 2020-06-15 DIAGNOSIS — I10 Essential (primary) hypertension: Secondary | ICD-10-CM | POA: Diagnosis not present

## 2020-06-15 DIAGNOSIS — I2510 Atherosclerotic heart disease of native coronary artery without angina pectoris: Secondary | ICD-10-CM | POA: Diagnosis not present

## 2020-06-15 DIAGNOSIS — N189 Chronic kidney disease, unspecified: Secondary | ICD-10-CM | POA: Diagnosis not present

## 2020-06-15 DIAGNOSIS — E211 Secondary hyperparathyroidism, not elsewhere classified: Secondary | ICD-10-CM | POA: Diagnosis not present

## 2020-06-15 DIAGNOSIS — C44221 Squamous cell carcinoma of skin of unspecified ear and external auricular canal: Secondary | ICD-10-CM | POA: Diagnosis not present

## 2020-06-15 DIAGNOSIS — D649 Anemia, unspecified: Secondary | ICD-10-CM | POA: Diagnosis not present

## 2020-06-15 DIAGNOSIS — R309 Painful micturition, unspecified: Secondary | ICD-10-CM | POA: Diagnosis not present

## 2020-06-15 DIAGNOSIS — N186 End stage renal disease: Secondary | ICD-10-CM | POA: Diagnosis not present

## 2020-06-15 HISTORY — DX: Pure hypercholesterolemia, unspecified: E78.00

## 2020-06-15 HISTORY — DX: Disorder of kidney and ureter, unspecified: N28.9

## 2020-06-18 DIAGNOSIS — N139 Obstructive and reflux uropathy, unspecified: Secondary | ICD-10-CM

## 2020-06-18 DIAGNOSIS — E872 Acidosis, unspecified: Secondary | ICD-10-CM

## 2020-06-18 HISTORY — DX: Acidosis, unspecified: E87.20

## 2020-06-18 HISTORY — DX: Acidosis: E87.2

## 2020-06-18 HISTORY — DX: Disorder of mineral metabolism, unspecified: E83.9

## 2020-06-18 HISTORY — DX: Obstructive and reflux uropathy, unspecified: N13.9

## 2020-07-03 ENCOUNTER — Encounter: Payer: Self-pay | Admitting: Family Medicine

## 2020-07-03 ENCOUNTER — Telehealth (INDEPENDENT_AMBULATORY_CARE_PROVIDER_SITE_OTHER): Payer: Medicare Other | Admitting: Family Medicine

## 2020-07-03 VITALS — Ht 64.0 in | Wt 169.0 lb

## 2020-07-03 DIAGNOSIS — I700 Atherosclerosis of aorta: Secondary | ICD-10-CM | POA: Diagnosis not present

## 2020-07-03 DIAGNOSIS — Z940 Kidney transplant status: Secondary | ICD-10-CM | POA: Diagnosis not present

## 2020-07-03 DIAGNOSIS — I2510 Atherosclerotic heart disease of native coronary artery without angina pectoris: Secondary | ICD-10-CM

## 2020-07-03 DIAGNOSIS — J984 Other disorders of lung: Secondary | ICD-10-CM

## 2020-07-03 DIAGNOSIS — R05 Cough: Secondary | ICD-10-CM | POA: Diagnosis not present

## 2020-07-03 DIAGNOSIS — R0602 Shortness of breath: Secondary | ICD-10-CM | POA: Diagnosis not present

## 2020-07-03 DIAGNOSIS — J440 Chronic obstructive pulmonary disease with acute lower respiratory infection: Secondary | ICD-10-CM | POA: Diagnosis not present

## 2020-07-03 DIAGNOSIS — J209 Acute bronchitis, unspecified: Secondary | ICD-10-CM | POA: Diagnosis not present

## 2020-07-03 DIAGNOSIS — F17200 Nicotine dependence, unspecified, uncomplicated: Secondary | ICD-10-CM

## 2020-07-03 MED ORDER — PREDNISONE 10 MG PO TABS: ORAL_TABLET | ORAL | 0 refills | Status: DC

## 2020-07-03 MED ORDER — AZITHROMYCIN 250 MG PO TABS: ORAL_TABLET | ORAL | 0 refills | Status: DC

## 2020-07-03 MED ORDER — BENZONATATE 200 MG PO CAPS
200.0000 mg | ORAL_CAPSULE | Freq: Two times a day (BID) | ORAL | 0 refills | Status: DC | PRN
Start: 2020-07-03 — End: 2020-09-06

## 2020-07-03 NOTE — Progress Notes (Addendum)
Virtual Visit via Telephone Note  I connected with Victor Schultz on 07/03/20 at 11:30 AM EDT by telephone and verified that I am speaking with the correct person using two identifiers. DOB/address  Location: Patient: home Provider: clinic  I discussed the limitations, risks, security and privacy concerns of performing an evaluation and management service by telephone and the availability of in person appointments. I also discussed with the patient that there may be a patient responsible charge related to this service. The patient expressed understanding and agreed to proceed.   History of Present Illness: Pt with cough and congestion-no fever.No nausea. Normal appetite. Incontinent urine. Normal bowels.  Wheezing noted through the phone with SOB. Pt sees nephrology -transplanted kidney. Pt has DM, CAD, and a kidney transplant.-asked pt to come to office for evaluation-concern for SOB and wheezing noted-pt lives alone-asked pt to come for IN office evaluation- 2 negative COVID tests  CONVERTED TO in OFFICE Observations/Objective: Today's Vitals   07/03/20 1115  Weight: 169 lb (76.7 kg)  Height: 5\' 4"  (1.626 m)   Body mass index is 29.01 kg/m.  Oxygen sat 95% RA, HR 88 BP 118/58 Physical Exam Constitutional:      Appearance: He is well-developed.  HENT:     Mouth/Throat:     Pharynx: No pharyngeal swelling or posterior oropharyngeal erythema.  Eyes:     Conjunctiva/sclera: Conjunctivae normal.  Cardiovascular:     Rate and Rhythm: Normal rate and regular rhythm.     Heart sounds: Normal heart sounds.     Comments: LE edema bilat Pulmonary:     Breath sounds: Wheezing and rhonchi present.  Abdominal:     General: There is distension.  Neurological:     Mental Status: He is alert and oriented to person, place, and time.  Psychiatric:        Mood and Affect: Mood normal.        Behavior: Behavior normal.    Assessment and Plan: 1. CAD in native artery No CP , LE edema  bilat - Ambulatory referral to Cardiology  2. Short of breath on exertion Concern for LE edema with h/o CAD - DG Chest 2 View; Future Likely COPD 3. Kidney transplant status, cadaveric nephro following-pt to call nephro-weight gain concerning  4. Restrictive lung disease Wheezing and rhonchi-cxr  5. Current every day smoker Encouraged to quit  6. Acute bronchitis with COPD ([place]) zithromax-/tessalon perles/prednisone-risk/benefit/side effect medication-d/w pt concern for possible elevated glucose with prednisone.  Follow Up Instructions: cxr Dayne Dekay Hannah Beat, MD SPOKE WITH PT ON THE PHONE FOR 18minutes, asked pt to come in the clinic-additional 49minutes -history, physical, discussion of assessment and plan-45 minutes  Reviewed cxr with pt-f/u in 2 days for evaluation-COPD exacerbation

## 2020-07-03 NOTE — Patient Instructions (Addendum)
Start prednisone today 6 today when obtained at pharmacy  Continue checking glucose-fasting and after biggest meal Chest xray today Cardiology referral Call endocrinologist for adjustment after prednisone taken zithromax Tessalon perles

## 2020-07-06 ENCOUNTER — Ambulatory Visit (INDEPENDENT_AMBULATORY_CARE_PROVIDER_SITE_OTHER): Payer: Medicare Other | Admitting: Family Medicine

## 2020-07-06 ENCOUNTER — Encounter: Payer: Self-pay | Admitting: Family Medicine

## 2020-07-06 ENCOUNTER — Ambulatory Visit: Payer: Medicare Other | Admitting: Cardiology

## 2020-07-06 VITALS — BP 126/68 | HR 91 | Temp 97.8°F | Ht 68.0 in | Wt 191.8 lb

## 2020-07-06 DIAGNOSIS — R062 Wheezing: Secondary | ICD-10-CM

## 2020-07-06 DIAGNOSIS — R635 Abnormal weight gain: Secondary | ICD-10-CM

## 2020-07-06 DIAGNOSIS — R0602 Shortness of breath: Secondary | ICD-10-CM | POA: Insufficient documentation

## 2020-07-06 DIAGNOSIS — J209 Acute bronchitis, unspecified: Secondary | ICD-10-CM | POA: Insufficient documentation

## 2020-07-06 DIAGNOSIS — J440 Chronic obstructive pulmonary disease with acute lower respiratory infection: Secondary | ICD-10-CM | POA: Insufficient documentation

## 2020-07-06 HISTORY — DX: Wheezing: R06.2

## 2020-07-06 HISTORY — DX: Abnormal weight gain: R63.5

## 2020-07-06 HISTORY — DX: Shortness of breath: R06.02

## 2020-07-06 NOTE — Progress Notes (Signed)
Acute Office Visit  Subjective:    Patient ID: Victor Schultz, male    DOB: 1951/07/08, 69 y.o.   MRN: [id number]  Chief Complaint  Patient presents with  . Chest Congestion    HPI Patient is in today for continued congestion-improving Pt with hoarseness this morning weight gain noted-kidney failure-concern for worsening renal concerns with weight gain, abdominal distension. Pt sees nephrology No fever, chills, nausea, vomiting.  Wheezing has improved   Past Medical History:  Diagnosis Date  . Atherosclerotic heart disease of native coronary artery with unstable angina pectoris ([place])   . Atrophy of thyroid (acquired)   . Cancer ([place])    skin cancer on both arms.  . Diabetes mellitus without complication ([place])   . History of end stage renal disease   . Hypertensive heart and chronic kidney disease without heart failure, with stage 5 chronic kidney disease, or end stage renal disease ([place])    ESR,kidney transplant  . Hypothyroidism   . Kidney transplant status   . Long term (current) use of insulin ([place])   . Mixed hyperlipidemia     Past Surgical History:  Procedure Laterality Date  . BIOPSY  05/24/2020   Procedure: BIOPSY;  Surgeon: [name], MD;  Location: [facility];  Service: Endoscopy;;  . CATARACT EXTRACTION    . CORONARY ARTERY BYPASS GRAFT    . ESOPHAGOGASTRODUODENOSCOPY (EGD) WITH PROPOFOL Left 12/23/2019   Procedure: ESOPHAGOGASTRODUODENOSCOPY (EGD) WITH PROPOFOL;  Surgeon: [name], MD;  Location: [facility];  Service: Endoscopy;  Laterality: Left;  . ESOPHAGOGASTRODUODENOSCOPY (EGD) WITH PROPOFOL N/A 05/24/2020   Procedure: ESOPHAGOGASTRODUODENOSCOPY (EGD) WITH PROPOFOL;  Surgeon: [name], MD;  Location: [facility];  Service: Endoscopy;  Laterality: N/A;  . KIDNEY TRANSPLANT     1974, 1982  . TRANSURETHRAL INCISION OF PROSTATE N/A 05/09/2020   Procedure: TRANSURETHRAL INCISION OF THE PROSTATE (TUIP);  Surgeon: Irine Seal, MD;   Location: [facility];  Service: Urology;  Laterality: N/A;    Family History  Problem Relation Age of Onset  . Liver disease Mother   . Alzheimer's disease Father     Social History   Socioeconomic History  . Marital status: Divorced    Spouse name: Not on file  . Number of children: 1  . Years of education: Not on file  . Highest education level: Not on file  Occupational History  . Occupation: retired  Tobacco Use  . Smoking status: Current Every Day Smoker    Packs/day: 0.50    Years: 39.00    Pack years: 19.50    Types: Cigarettes  . Smokeless tobacco: Never Used  Vaping Use  . Vaping Use: Never used  Substance and Sexual Activity  . Alcohol use: Never  . Drug use: Never  . Sexual activity: Not Currently  Other Topics Concern  . Not on file  Social History Narrative  . Not on file   Social Determinants of Health   Financial Resource Strain:   . Difficulty of Paying Living Expenses: Not on file  Food Insecurity: No Food Insecurity  . Worried About Charity fundraiser in the Last Year: Never true  . Ran Out of Food in the Last Year: Never true  Transportation Needs: No Transportation Needs  . Lack of Transportation (Medical): No  . Lack of Transportation (Non-Medical): No  Physical Activity:   . Days of Exercise per Week: Not on file  . Minutes of Exercise per Session: Not on file  Stress:   .  Feeling of Stress : Not on file  Social Connections: Socially Isolated  . Frequency of Communication with Friends and Family: More than three times a week  . Frequency of Social Gatherings with Friends and Family: Once a week  . Attends Religious Services: Never  . Active Member of Clubs or Organizations: No  . Attends Archivist Meetings: Never  . Marital Status: Divorced  Human resources officer Violence:   . Fear of Current or Ex-Partner: Not on file  . Emotionally Abused: Not on file  . Physically Abused: Not on file  . Sexually Abused: Not on file     Outpatient Medications Prior to Visit  Medication Sig Dispense Refill  . amLODipine (NORVASC) 5 MG tablet Take 5 mg by mouth daily.    [name] atorvastatin (LIPITOR) 20 MG tablet Take 20 mg by mouth daily.    [name] azaTHIOprine (IMURAN) 50 MG tablet Take 2.5 tablets (125 mg total) by mouth daily. Taking 1.5 tablets in the am and 1 tablets in the evening. (Patient taking differently: Take 50-75 mg by mouth See admin instructions. Take 75 mg in the morning and 50 mg at night) 225 tablet 5  . azithromycin (ZITHROMAX) 250 MG tablet Take two po today then one po day 2-5 6 tablet 0  . B-D UF III MINI PEN NEEDLES 31G X 5 MM MISC USE WITH LANTUS AS DIRECTED    . benzonatate (TESSALON) 200 MG capsule Take 1 capsule (200 mg total) by mouth 2 (two) times daily as needed for cough. 30 capsule 0  . calcitRIOL (ROCALTROL) 0.25 MCG capsule Take 0.25 mcg by mouth daily.    [name] LANTUS SOLOSTAR 100 UNIT/ML Solostar Pen Inject 35 Units into the skin at bedtime.     [name] levothyroxine (SYNTHROID) 50 MCG tablet Take 1 tablet (50 mcg total) by mouth daily. 90 tablet 0  . LOKELMA 10 g PACK packet Take by mouth.    . metoprolol tartrate (LOPRESSOR) 25 MG tablet Take 0.5 tablets (12.5 mg total) by mouth 2 (two) times daily. (Patient taking differently: Take 25 mg by mouth 2 (two) times daily. ) 90 tablet 0  . pantoprazole (PROTONIX) 40 MG tablet Take 1 tablet (40 mg total) by mouth 2 (two) times daily. (Patient taking differently: Take 40 mg by mouth daily. ) 60 tablet 2  . predniSONE (DELTASONE) 10 MG tablet Take six today with food then 3 po BID x 2 days starting tomorrow, then 2po BID x 3 days then 1 po BID x 3 days then 1 po qd 39 tablet 0  . predniSONE (DELTASONE) 5 MG tablet Take 1 tablet by mouth every other day.    . sodium bicarbonate 650 MG tablet Take 1,300 mg by mouth 2 (two) times daily.     . sodium zirconium cyclosilicate (LOKELMA) 10 g PACK packet Take by mouth.    . sucroferric oxyhydroxide (VELPHORO) 500 MG  chewable tablet Chew by mouth.    . Vitamin D, Ergocalciferol, (DRISDOL) 1.25 MG (50000 UNIT) CAPS capsule Take 50,000 Units by mouth every Sunday.      No facility-administered medications prior to visit.    No Known Allergies  Review of Systems  Respiratory: Positive for cough and wheezing.   Cardiovascular: Positive for leg swelling. Negative for chest pain.  Gastrointestinal: Positive for abdominal distention. Negative for abdominal pain, diarrhea and nausea.  Genitourinary:       Kidney transplant       Objective:    Physical Exam Constitutional:  Appearance: Normal appearance.  HENT:     Head: Normocephalic.  Cardiovascular:     Rate and Rhythm: Normal rate and regular rhythm.     Pulses: Normal pulses.     Heart sounds: Normal heart sounds.  Pulmonary:     Breath sounds: Wheezing and rhonchi present.  Abdominal:     General: There is distension.     Palpations: Abdomen is soft.  Musculoskeletal:     Cervical back: Normal range of motion.     Right lower leg: Edema present.     Left lower leg: Edema present.  Neurological:     Mental Status: He is alert.  Psychiatric:        Mood and Affect: Mood normal.        Behavior: Behavior normal.     BP 126/68 (BP Location: Left Arm, Patient Position: Sitting, Cuff Size: Normal)   Pulse 91   Temp 97.8 F (36.6 C) (Temporal)   Ht '5\' 8"'  (1.727 m)   Wt 191 lb 12.8 oz (87 kg)   BMI 29.16 kg/m  Wt Readings from Last 3 Encounters:  07/06/20 191 lb 12.8 oz (87 kg)  07/03/20 169 lb (76.7 kg)  06/06/20 169 lb 6.4 oz (76.8 kg)    Health Maintenance Due  Topic Date Due  . OPHTHALMOLOGY EXAM  Never done  . TETANUS/TDAP  Never done  . PNA vac Low Risk Adult (1 of 2 - PCV13) Never done  . Fecal DNA (Cologuard)  01/18/2020  . INFLUENZA VACCINE  05/07/2020    Lab Results  Component Value Date   TSH 3.310 12/06/2019   Lab Results  Component Value Date   WBC 7.4 05/02/2020   HGB 8.8 (L) 05/24/2020   HCT 26.0  (L) 05/24/2020   MCV 106.8 (H) 05/02/2020   PLT 219 05/02/2020   Lab Results  Component Value Date   NA 145 05/24/2020   K 4.9 05/24/2020   CO2 19 (L) 05/02/2020   GLUCOSE 155 (H) 05/02/2020   BUN 48 (H) 05/02/2020   CREATININE 4.00 (H) 05/02/2020   BILITOT 0.5 04/05/2020   ALKPHOS 89 04/05/2020   AST 23 04/05/2020   ALT 17 04/05/2020   PROT 6.4 04/05/2020   ALBUMIN 2.5 (L) 04/05/2020   CALCIUM 8.0 (L) 05/02/2020   ANIONGAP 6 05/02/2020   Lab Results  Component Value Date   CHOL 167 04/05/2020   Lab Results  Component Value Date   HDL 44 04/05/2020   Lab Results  Component Value Date   LDLCALC 77 04/05/2020   Lab Results  Component Value Date   TRIG 283 (H) 04/05/2020   Lab Results  Component Value Date   CHOLHDL 3.8 04/05/2020   Lab Results  Component Value Date   HGBA1C 6.4 (H) 04/05/2020       Assessment & Plan:  1. Wheezing cxr-bibasilar screening, no new opacity, stable cardiac silhouette, no pneumonia  Continue prednisone-wheezing has improved on exam. Complete zpack 2. Weight gain Pt to call nephrology-22 lb weight gain in 1 month-concern for worsening renal failure-abdominal distension +BS soft without pain Tonie Vizcarrondo Hannah Beat, MD

## 2020-07-06 NOTE — Patient Instructions (Signed)
Pt to call kidney doctor Continue prednisone and antibiotics

## 2020-07-07 DIAGNOSIS — I071 Rheumatic tricuspid insufficiency: Secondary | ICD-10-CM | POA: Diagnosis present

## 2020-07-07 DIAGNOSIS — J984 Other disorders of lung: Secondary | ICD-10-CM | POA: Diagnosis not present

## 2020-07-07 DIAGNOSIS — R1312 Dysphagia, oropharyngeal phase: Secondary | ICD-10-CM | POA: Diagnosis not present

## 2020-07-07 DIAGNOSIS — Z66 Do not resuscitate: Secondary | ICD-10-CM | POA: Diagnosis not present

## 2020-07-07 DIAGNOSIS — J029 Acute pharyngitis, unspecified: Secondary | ICD-10-CM | POA: Diagnosis not present

## 2020-07-07 DIAGNOSIS — K219 Gastro-esophageal reflux disease without esophagitis: Secondary | ICD-10-CM | POA: Diagnosis present

## 2020-07-07 DIAGNOSIS — D649 Anemia, unspecified: Secondary | ICD-10-CM | POA: Diagnosis not present

## 2020-07-07 DIAGNOSIS — I2510 Atherosclerotic heart disease of native coronary artery without angina pectoris: Secondary | ICD-10-CM | POA: Diagnosis present

## 2020-07-07 DIAGNOSIS — R0602 Shortness of breath: Secondary | ICD-10-CM | POA: Diagnosis not present

## 2020-07-07 DIAGNOSIS — R188 Other ascites: Secondary | ICD-10-CM | POA: Diagnosis present

## 2020-07-07 DIAGNOSIS — N289 Disorder of kidney and ureter, unspecified: Secondary | ICD-10-CM | POA: Diagnosis not present

## 2020-07-07 DIAGNOSIS — R062 Wheezing: Secondary | ICD-10-CM | POA: Diagnosis not present

## 2020-07-07 DIAGNOSIS — K8020 Calculus of gallbladder without cholecystitis without obstruction: Secondary | ICD-10-CM | POA: Diagnosis not present

## 2020-07-07 DIAGNOSIS — Z20822 Contact with and (suspected) exposure to covid-19: Secondary | ICD-10-CM | POA: Diagnosis present

## 2020-07-07 DIAGNOSIS — N049 Nephrotic syndrome with unspecified morphologic changes: Secondary | ICD-10-CM | POA: Diagnosis present

## 2020-07-07 DIAGNOSIS — I10 Essential (primary) hypertension: Secondary | ICD-10-CM | POA: Diagnosis not present

## 2020-07-07 DIAGNOSIS — E875 Hyperkalemia: Secondary | ICD-10-CM | POA: Diagnosis present

## 2020-07-07 DIAGNOSIS — K5730 Diverticulosis of large intestine without perforation or abscess without bleeding: Secondary | ICD-10-CM | POA: Diagnosis not present

## 2020-07-07 DIAGNOSIS — R262 Difficulty in walking, not elsewhere classified: Secondary | ICD-10-CM | POA: Diagnosis not present

## 2020-07-07 DIAGNOSIS — E785 Hyperlipidemia, unspecified: Secondary | ICD-10-CM | POA: Diagnosis present

## 2020-07-07 DIAGNOSIS — R601 Generalized edema: Secondary | ICD-10-CM | POA: Diagnosis not present

## 2020-07-07 DIAGNOSIS — T8619 Other complication of kidney transplant: Secondary | ICD-10-CM | POA: Diagnosis present

## 2020-07-07 DIAGNOSIS — T8612 Kidney transplant failure: Secondary | ICD-10-CM | POA: Diagnosis not present

## 2020-07-07 DIAGNOSIS — E1165 Type 2 diabetes mellitus with hyperglycemia: Secondary | ICD-10-CM | POA: Diagnosis present

## 2020-07-07 DIAGNOSIS — N183 Chronic kidney disease, stage 3 unspecified: Secondary | ICD-10-CM | POA: Diagnosis not present

## 2020-07-07 DIAGNOSIS — Z940 Kidney transplant status: Secondary | ICD-10-CM | POA: Diagnosis not present

## 2020-07-07 DIAGNOSIS — T380X5A Adverse effect of glucocorticoids and synthetic analogues, initial encounter: Secondary | ICD-10-CM | POA: Diagnosis not present

## 2020-07-07 DIAGNOSIS — E039 Hypothyroidism, unspecified: Secondary | ICD-10-CM | POA: Diagnosis present

## 2020-07-07 DIAGNOSIS — R609 Edema, unspecified: Secondary | ICD-10-CM | POA: Diagnosis not present

## 2020-07-07 DIAGNOSIS — I129 Hypertensive chronic kidney disease with stage 1 through stage 4 chronic kidney disease, or unspecified chronic kidney disease: Secondary | ICD-10-CM | POA: Diagnosis not present

## 2020-07-07 DIAGNOSIS — R41841 Cognitive communication deficit: Secondary | ICD-10-CM | POA: Diagnosis not present

## 2020-07-07 DIAGNOSIS — I1311 Hypertensive heart and chronic kidney disease without heart failure, with stage 5 chronic kidney disease, or end stage renal disease: Secondary | ICD-10-CM | POA: Diagnosis present

## 2020-07-07 DIAGNOSIS — M6281 Muscle weakness (generalized): Secondary | ICD-10-CM | POA: Diagnosis not present

## 2020-07-07 DIAGNOSIS — J441 Chronic obstructive pulmonary disease with (acute) exacerbation: Secondary | ICD-10-CM | POA: Diagnosis present

## 2020-07-07 DIAGNOSIS — N186 End stage renal disease: Secondary | ICD-10-CM | POA: Diagnosis present

## 2020-07-07 DIAGNOSIS — J90 Pleural effusion, not elsewhere classified: Secondary | ICD-10-CM | POA: Diagnosis present

## 2020-07-07 DIAGNOSIS — J449 Chronic obstructive pulmonary disease, unspecified: Secondary | ICD-10-CM | POA: Diagnosis not present

## 2020-07-07 DIAGNOSIS — E8770 Fluid overload, unspecified: Secondary | ICD-10-CM | POA: Diagnosis not present

## 2020-07-07 DIAGNOSIS — R9431 Abnormal electrocardiogram [ECG] [EKG]: Secondary | ICD-10-CM | POA: Diagnosis not present

## 2020-07-07 DIAGNOSIS — R000 Tachycardia, unspecified: Secondary | ICD-10-CM | POA: Diagnosis not present

## 2020-07-07 DIAGNOSIS — R140 Abdominal distension (gaseous): Secondary | ICD-10-CM | POA: Diagnosis not present

## 2020-07-07 DIAGNOSIS — I081 Rheumatic disorders of both mitral and tricuspid valves: Secondary | ICD-10-CM | POA: Diagnosis not present

## 2020-07-07 DIAGNOSIS — Z992 Dependence on renal dialysis: Secondary | ICD-10-CM | POA: Diagnosis not present

## 2020-07-07 DIAGNOSIS — I517 Cardiomegaly: Secondary | ICD-10-CM | POA: Diagnosis not present

## 2020-07-07 DIAGNOSIS — E8779 Other fluid overload: Secondary | ICD-10-CM | POA: Diagnosis present

## 2020-07-07 DIAGNOSIS — R34 Anuria and oliguria: Secondary | ICD-10-CM | POA: Diagnosis not present

## 2020-07-07 DIAGNOSIS — N178 Other acute kidney failure: Secondary | ICD-10-CM | POA: Diagnosis not present

## 2020-07-07 DIAGNOSIS — D84821 Immunodeficiency due to drugs: Secondary | ICD-10-CM | POA: Diagnosis present

## 2020-07-07 DIAGNOSIS — I272 Pulmonary hypertension, unspecified: Secondary | ICD-10-CM | POA: Diagnosis present

## 2020-07-07 DIAGNOSIS — N179 Acute kidney failure, unspecified: Secondary | ICD-10-CM | POA: Diagnosis not present

## 2020-07-07 DIAGNOSIS — N184 Chronic kidney disease, stage 4 (severe): Secondary | ICD-10-CM | POA: Diagnosis not present

## 2020-07-07 DIAGNOSIS — J189 Pneumonia, unspecified organism: Secondary | ICD-10-CM | POA: Diagnosis not present

## 2020-07-07 DIAGNOSIS — B182 Chronic viral hepatitis C: Secondary | ICD-10-CM | POA: Diagnosis present

## 2020-07-07 DIAGNOSIS — E1122 Type 2 diabetes mellitus with diabetic chronic kidney disease: Secondary | ICD-10-CM | POA: Diagnosis not present

## 2020-07-07 DIAGNOSIS — Z4901 Encounter for fitting and adjustment of extracorporeal dialysis catheter: Secondary | ICD-10-CM | POA: Diagnosis not present

## 2020-07-07 DIAGNOSIS — D631 Anemia in chronic kidney disease: Secondary | ICD-10-CM | POA: Diagnosis present

## 2020-07-15 DIAGNOSIS — D631 Anemia in chronic kidney disease: Secondary | ICD-10-CM | POA: Diagnosis not present

## 2020-07-15 DIAGNOSIS — I10 Essential (primary) hypertension: Secondary | ICD-10-CM | POA: Diagnosis not present

## 2020-07-15 DIAGNOSIS — N2581 Secondary hyperparathyroidism of renal origin: Secondary | ICD-10-CM | POA: Diagnosis not present

## 2020-07-15 DIAGNOSIS — Z992 Dependence on renal dialysis: Secondary | ICD-10-CM | POA: Diagnosis not present

## 2020-07-15 DIAGNOSIS — Z940 Kidney transplant status: Secondary | ICD-10-CM | POA: Diagnosis not present

## 2020-07-15 DIAGNOSIS — N186 End stage renal disease: Secondary | ICD-10-CM | POA: Diagnosis not present

## 2020-07-15 DIAGNOSIS — D649 Anemia, unspecified: Secondary | ICD-10-CM | POA: Diagnosis not present

## 2020-07-15 DIAGNOSIS — I129 Hypertensive chronic kidney disease with stage 1 through stage 4 chronic kidney disease, or unspecified chronic kidney disease: Secondary | ICD-10-CM | POA: Diagnosis not present

## 2020-07-15 DIAGNOSIS — R262 Difficulty in walking, not elsewhere classified: Secondary | ICD-10-CM | POA: Diagnosis not present

## 2020-07-15 DIAGNOSIS — N184 Chronic kidney disease, stage 4 (severe): Secondary | ICD-10-CM | POA: Diagnosis not present

## 2020-07-15 DIAGNOSIS — E1122 Type 2 diabetes mellitus with diabetic chronic kidney disease: Secondary | ICD-10-CM | POA: Diagnosis not present

## 2020-07-15 DIAGNOSIS — K219 Gastro-esophageal reflux disease without esophagitis: Secondary | ICD-10-CM | POA: Diagnosis not present

## 2020-07-15 DIAGNOSIS — E119 Type 2 diabetes mellitus without complications: Secondary | ICD-10-CM | POA: Diagnosis not present

## 2020-07-15 DIAGNOSIS — R41841 Cognitive communication deficit: Secondary | ICD-10-CM | POA: Diagnosis not present

## 2020-07-15 DIAGNOSIS — N179 Acute kidney failure, unspecified: Secondary | ICD-10-CM | POA: Diagnosis not present

## 2020-07-15 DIAGNOSIS — R188 Other ascites: Secondary | ICD-10-CM | POA: Diagnosis not present

## 2020-07-15 DIAGNOSIS — R1312 Dysphagia, oropharyngeal phase: Secondary | ICD-10-CM | POA: Diagnosis not present

## 2020-07-15 DIAGNOSIS — M6281 Muscle weakness (generalized): Secondary | ICD-10-CM | POA: Diagnosis not present

## 2020-07-17 ENCOUNTER — Encounter: Payer: Self-pay | Admitting: *Deleted

## 2020-07-17 ENCOUNTER — Other Ambulatory Visit: Payer: Self-pay | Admitting: *Deleted

## 2020-07-17 DIAGNOSIS — K219 Gastro-esophageal reflux disease without esophagitis: Secondary | ICD-10-CM | POA: Diagnosis not present

## 2020-07-17 DIAGNOSIS — E119 Type 2 diabetes mellitus without complications: Secondary | ICD-10-CM | POA: Diagnosis not present

## 2020-07-17 DIAGNOSIS — D649 Anemia, unspecified: Secondary | ICD-10-CM | POA: Diagnosis not present

## 2020-07-17 DIAGNOSIS — N186 End stage renal disease: Secondary | ICD-10-CM | POA: Diagnosis not present

## 2020-07-18 ENCOUNTER — Ambulatory Visit: Payer: Medicare Other | Admitting: Cardiology

## 2020-07-18 DIAGNOSIS — N186 End stage renal disease: Secondary | ICD-10-CM | POA: Diagnosis not present

## 2020-07-18 DIAGNOSIS — N2581 Secondary hyperparathyroidism of renal origin: Secondary | ICD-10-CM | POA: Diagnosis not present

## 2020-07-18 DIAGNOSIS — D631 Anemia in chronic kidney disease: Secondary | ICD-10-CM | POA: Diagnosis not present

## 2020-07-19 DIAGNOSIS — E119 Type 2 diabetes mellitus without complications: Secondary | ICD-10-CM | POA: Diagnosis not present

## 2020-07-19 DIAGNOSIS — D649 Anemia, unspecified: Secondary | ICD-10-CM | POA: Diagnosis not present

## 2020-07-19 DIAGNOSIS — K219 Gastro-esophageal reflux disease without esophagitis: Secondary | ICD-10-CM | POA: Diagnosis not present

## 2020-07-19 DIAGNOSIS — N186 End stage renal disease: Secondary | ICD-10-CM | POA: Diagnosis not present

## 2020-07-20 DIAGNOSIS — N186 End stage renal disease: Secondary | ICD-10-CM | POA: Diagnosis not present

## 2020-07-20 DIAGNOSIS — D631 Anemia in chronic kidney disease: Secondary | ICD-10-CM | POA: Diagnosis not present

## 2020-07-20 DIAGNOSIS — N2581 Secondary hyperparathyroidism of renal origin: Secondary | ICD-10-CM | POA: Diagnosis not present

## 2020-07-22 DIAGNOSIS — D631 Anemia in chronic kidney disease: Secondary | ICD-10-CM | POA: Diagnosis not present

## 2020-07-22 DIAGNOSIS — N2581 Secondary hyperparathyroidism of renal origin: Secondary | ICD-10-CM | POA: Diagnosis not present

## 2020-07-22 DIAGNOSIS — N186 End stage renal disease: Secondary | ICD-10-CM | POA: Diagnosis not present

## 2020-07-25 DIAGNOSIS — N186 End stage renal disease: Secondary | ICD-10-CM | POA: Diagnosis not present

## 2020-07-25 DIAGNOSIS — N2581 Secondary hyperparathyroidism of renal origin: Secondary | ICD-10-CM | POA: Diagnosis not present

## 2020-07-25 DIAGNOSIS — D631 Anemia in chronic kidney disease: Secondary | ICD-10-CM | POA: Diagnosis not present

## 2020-07-26 ENCOUNTER — Telehealth: Payer: Medicare Other

## 2020-07-26 NOTE — Chronic Care Management (AMB) (Deleted)
Chronic Care Management Pharmacy  Name: Victor Schultz  MRN: [id number] DOB: 1951-05-28  Chief Complaint/ HPI  Victor Schultz,  69 y.o. , male presents for their Initial CCM visit with the clinical pharmacist via telephone due to COVID-19 Pandemic.  PCP : Lillard Anes, MD  Their chronic conditions include: CAD, HTN, GERD, DM2, CKD, HLD.  Office Visits: 07/06/2020 - chest x-ray. Call nephrology for 22 lb weight gain in 1 month.  07/03/2020 - shortness of breath. Prednisone taper ordered, tessalon perles and zithromax. Check blood sugar fasting and after biggest meal. Call endocrinology for blood sugar adjustment.  06/06/2020 - AWV.  05/08/2020 - Dizziness in the am. BP elevated due to anxiety over urological procedure. Dizziness likely from low blood sugar. Noon blood sugar 90 mg/dL. Insulin glargine reduced to 35 units daily.   Consult Visit: 07/07/2020 [facility] admission - COPD and ESRD requiring hemodialysis inpatient. Discharged with prednisone taper for 7-10 days, prn albuterol and Spiriva.  06/15/2020 - Nephrology - start amlodipine 5 mg once daily. Velphoro samples to take with each meal. Referral to pulmonology.  05/24/2020- Endoscopy. Suspicious for Barrett's esophagus.  05/09/2020 - urological procedure.   Medications: Outpatient Encounter Medications as of 07/26/2020  Medication Sig  . amLODipine (NORVASC) 5 MG tablet Take 5 mg by mouth daily.  [name] atorvastatin (LIPITOR) 20 MG tablet Take 20 mg by mouth daily.  [name] azaTHIOprine (IMURAN) 50 MG tablet Take 2.5 tablets (125 mg total) by mouth daily. Taking 1.5 tablets in the am and 1 tablets in the evening. (Patient taking differently: Take 50-75 mg by mouth See admin instructions. Take 75 mg in the morning and 50 mg at night)  . azithromycin (ZITHROMAX) 250 MG tablet Take two po today then one po day 2-5  . B-D UF III MINI PEN NEEDLES 31G X 5 MM MISC USE WITH LANTUS AS DIRECTED  . benzonatate (TESSALON) 200 MG capsule  Take 1 capsule (200 mg total) by mouth 2 (two) times daily as needed for cough.  . calcitRIOL (ROCALTROL) 0.25 MCG capsule Take 0.25 mcg by mouth daily.  . insulin glargine (LANTUS SOLOSTAR) 100 UNIT/ML Solostar Pen Inject 30 Units into the skin daily.  [name] levothyroxine (SYNTHROID) 50 MCG tablet Take 1 tablet (50 mcg total) by mouth daily.  [name] LOKELMA 10 g PACK packet Take by mouth.  . metoprolol tartrate (LOPRESSOR) 25 MG tablet Take 0.5 tablets (12.5 mg total) by mouth 2 (two) times daily. (Patient taking differently: Take 25 mg by mouth 2 (two) times daily. )  . pantoprazole (PROTONIX) 40 MG tablet Take 1 tablet (40 mg total) by mouth 2 (two) times daily. (Patient taking differently: Take 40 mg by mouth daily. )  . predniSONE (DELTASONE) 10 MG tablet Take six today with food then 3 po BID x 2 days starting tomorrow, then 2po BID x 3 days then 1 po BID x 3 days then 1 po qd  . predniSONE (DELTASONE) 5 MG tablet Take 1 tablet by mouth every other day.  . sodium bicarbonate 650 MG tablet Take 1,300 mg by mouth 2 (two) times daily.   . sodium zirconium cyclosilicate (LOKELMA) 10 g PACK packet Take by mouth.  . sucroferric oxyhydroxide (VELPHORO) 500 MG chewable tablet Chew by mouth.  . Vitamin D, Ergocalciferol, (DRISDOL) 1.25 MG (50000 UNIT) CAPS capsule Take 50,000 Units by mouth every Sunday.    No facility-administered encounter medications on file as of 07/26/2020.     Current Diagnosis/Assessment:  Goals Addressed  None     Diabetes   Recent Relevant Labs: Lab Results  Component Value Date/Time   HGBA1C 6.4 (H) 04/05/2020 09:35 AM   HGBA1C 6.5 (H) 12/06/2019 09:17 AM   MICROALBUR 150 12/06/2019 09:03 AM     Checking BG: Never   Patient has failed these meds in past: none listed Patient is currently controlled on the following medications:lantus solostar, bd pen needles  We discussed: smoking cessation and diet. Patient is limited on diet due to low potassium diet per  Nephrology. Patient says he tries to do well and watch sugar. He does not check blood sugar but knows when it is low and how to treat.   Update 04/07/2020 - He has had 1 low in the past month. It was 70 mg/dL. Ate a candy bar.  He eats like he is supposed to. He checks blood sugar when feels like it is low.   Plan  Continue current medications,  Hypertension   BP today is:  <140/90  Office blood pressures are  BP Readings from Last 3 Encounters:  07/06/20 126/68  06/06/20 (!) 154/64  05/24/20 (!) 198/84    Patient has failed these meds in the past: none listed Patient is currently controlled on the following medications: metoprolol tartrate 25 mg bid  Patient checks BP at home: never  Patient home BP readings are ranging: have always been good. Says they have increased since recent GI bleed. His metoprolol has been increased. Patient is still weak and low HGB after 3 units of blood.   We discussed smoking cessation and the improvements he has made by decreasing to 1/2 pack/day. Patient will let PharmD know if he wants medication support to stop smoking.   Update 04/07/2020- Smoked 5 cigarettes/day the past two days. Hasn't smoked yet today. He is cutting back and trying not to smoke unless he has to.   Plan  Continue current medications   Hyperlipidemia/CAD   Lipid Panel     Component Value Date/Time   CHOL 167 04/05/2020 0935   TRIG 283 (H) 04/05/2020 0935   HDL 44 04/05/2020 0935   CHOLHDL 3.8 04/05/2020 0935   LDLCALC 77 04/05/2020 0935   LABVLDL 46 (H) 04/05/2020 0935     The 10-year ASCVD risk score Mikey Bussing DC Jr., et al., 2013) is: 55.2%   Values used to calculate the score:     Age: 60 years     Sex: Male     Is Non-Hispanic African American: No     Diabetic: Yes     Tobacco smoker: Yes     Systolic Blood Pressure: 287 mmHg     Is BP treated: Yes     HDL Cholesterol: 44 mg/dL     Total Cholesterol: 167 mg/dl   Patient has failed these meds in past:  vascepa Patient is currently controlled on the following medications: aspirin ec 81 mg, atorvastatin 10 mg  We discussed:  diet and exercise extensively. Patient is still weak from blood loss. He is working to build up stamina by walking around his house and walking to his mailbox.   Update 04/07/2020 - Patient is not able to walk to mailbox without being winded. He is hoping this will improve after discovering sources of bleeding.   Plan  Continue current medications     and  GERD/GI bleed/Ulcer    Patient has failed these meds in past: Tums Patient is currently controlled on the following medications:  pantoprazole 40 mg bid  We  discussed:  Continuing current regimen. Following up with GI doctor. Patient denies any additional aspirin or NSAID use that could have contributed. Patient does not drink alcohol.    Update 04/07/2020 - Black in stool when he goes to the bathroom but not every time. Patient is concerned with increased loose stools. He is unable to mow his yard without stopping for loose stools. He is waking up during the night with diarrhea as well. The urgency of these events is impeding his QOL and ADL. Patient cannot determine a trigger and has not changed anything. The looser stool began after GI bleed but is worsening in frequency/urgency. Pharmacist counseled patient on contacting GI regarding these symptoms.   Plan  Continue current medications and contact GI regarding loose stools.   Hypothyroidism   TSH  Date Value Ref Range Status  12/06/2019 3.310 0.450 - 4.500 uIU/mL Final     Patient has failed these meds in past: n/a Patient is currently controlled on the following medications: levothyroxine  We discussed:  Continue current medications and the proper administration of levothyroxine 30 mins before food and all other meds.    Update 04/07/2020 - Patient states that medication adherence to treatment and symptoms management is well controlled.    Plan  Continue current medications   S/P Kidney transplant/CKD stage 4   Lab Results  Component Value Date   CREATININE 4.00 (H) 05/02/2020   CREATININE 4.06 (H) 04/05/2020   CREATININE 3.20 (H) 01/11/2020    Patient has failed these meds in past: none  Patient is currently controlled on the following medications: Azathiprine 50 mg tablet, Prednisone 5, tamsulosin   We discussed:  Following up with Nephrology as recommended. Continuing to monitor diet as directed. Avoiding potatoes and potassium rich foods. We educated on why and what he takes for anti-rejection.   Update 04/06/2020 - Had a bladder study that showed 15% didn't empty from bladder. He follows up with urologist next week.   Plan  Continue current medications and continue to follow-up with Urologist.     Vaccines   Reviewed and discussed patient's vaccination history. Has completed both COVID vaccines.   Immunization History  Administered Date(s) Administered  . Influenza-Unspecified 10/07/2018  . Moderna SARS-COVID-2 Vaccination 12/08/2019, 01/10/2020    Plan  Recommended patient receive Pneumonia vaccine in office at next visit.   Health Maintenance   Patient is currently controlled on the following medications:  Vitamin D 50,000 unit - low Vitamin D Sodium Bicarbonate - metabolic acidosis  We discussed:  Patient says that he does not take any OTC without approval from his doctor due to kidneys.   Plan  Continue current medications  Medication Management   Pt uses [facility] and [facility] for all medications Does use pill box to keep organized.  Pt endorses good compliance - may have missed 5 evening med doses in his life.   We discussed: continuing good medication compliance.   Plan  Patient will reach out with any questions or concerns.    Follow up: 2 months.

## 2020-07-27 DIAGNOSIS — D631 Anemia in chronic kidney disease: Secondary | ICD-10-CM | POA: Diagnosis not present

## 2020-07-27 DIAGNOSIS — N186 End stage renal disease: Secondary | ICD-10-CM | POA: Diagnosis not present

## 2020-07-27 DIAGNOSIS — N2581 Secondary hyperparathyroidism of renal origin: Secondary | ICD-10-CM | POA: Diagnosis not present

## 2020-07-29 DIAGNOSIS — N2581 Secondary hyperparathyroidism of renal origin: Secondary | ICD-10-CM | POA: Diagnosis not present

## 2020-07-29 DIAGNOSIS — D631 Anemia in chronic kidney disease: Secondary | ICD-10-CM | POA: Diagnosis not present

## 2020-07-29 DIAGNOSIS — N186 End stage renal disease: Secondary | ICD-10-CM | POA: Diagnosis not present

## 2020-08-01 DIAGNOSIS — N186 End stage renal disease: Secondary | ICD-10-CM | POA: Diagnosis not present

## 2020-08-01 DIAGNOSIS — D631 Anemia in chronic kidney disease: Secondary | ICD-10-CM | POA: Diagnosis not present

## 2020-08-01 DIAGNOSIS — N2581 Secondary hyperparathyroidism of renal origin: Secondary | ICD-10-CM | POA: Diagnosis not present

## 2020-08-02 ENCOUNTER — Ambulatory Visit: Payer: Medicare Other | Admitting: Legal Medicine

## 2020-08-02 DIAGNOSIS — N186 End stage renal disease: Secondary | ICD-10-CM | POA: Diagnosis not present

## 2020-08-02 DIAGNOSIS — R188 Other ascites: Secondary | ICD-10-CM | POA: Diagnosis not present

## 2020-08-03 DIAGNOSIS — N186 End stage renal disease: Secondary | ICD-10-CM | POA: Diagnosis not present

## 2020-08-03 DIAGNOSIS — E1122 Type 2 diabetes mellitus with diabetic chronic kidney disease: Secondary | ICD-10-CM | POA: Diagnosis not present

## 2020-08-05 DIAGNOSIS — N186 End stage renal disease: Secondary | ICD-10-CM | POA: Diagnosis not present

## 2020-08-05 DIAGNOSIS — D631 Anemia in chronic kidney disease: Secondary | ICD-10-CM | POA: Diagnosis not present

## 2020-08-05 DIAGNOSIS — N2581 Secondary hyperparathyroidism of renal origin: Secondary | ICD-10-CM | POA: Diagnosis not present

## 2020-08-06 DIAGNOSIS — Z794 Long term (current) use of insulin: Secondary | ICD-10-CM | POA: Diagnosis not present

## 2020-08-06 DIAGNOSIS — I2510 Atherosclerotic heart disease of native coronary artery without angina pectoris: Secondary | ICD-10-CM | POA: Diagnosis not present

## 2020-08-06 DIAGNOSIS — Z7952 Long term (current) use of systemic steroids: Secondary | ICD-10-CM | POA: Diagnosis not present

## 2020-08-06 DIAGNOSIS — E039 Hypothyroidism, unspecified: Secondary | ICD-10-CM | POA: Diagnosis not present

## 2020-08-06 DIAGNOSIS — Z940 Kidney transplant status: Secondary | ICD-10-CM | POA: Diagnosis not present

## 2020-08-06 DIAGNOSIS — N186 End stage renal disease: Secondary | ICD-10-CM | POA: Diagnosis not present

## 2020-08-06 DIAGNOSIS — D631 Anemia in chronic kidney disease: Secondary | ICD-10-CM | POA: Diagnosis not present

## 2020-08-06 DIAGNOSIS — F17210 Nicotine dependence, cigarettes, uncomplicated: Secondary | ICD-10-CM | POA: Diagnosis not present

## 2020-08-06 DIAGNOSIS — R140 Abdominal distension (gaseous): Secondary | ICD-10-CM | POA: Diagnosis not present

## 2020-08-06 DIAGNOSIS — N179 Acute kidney failure, unspecified: Secondary | ICD-10-CM | POA: Diagnosis not present

## 2020-08-06 DIAGNOSIS — E1165 Type 2 diabetes mellitus with hyperglycemia: Secondary | ICD-10-CM | POA: Diagnosis not present

## 2020-08-06 DIAGNOSIS — J449 Chronic obstructive pulmonary disease, unspecified: Secondary | ICD-10-CM | POA: Diagnosis not present

## 2020-08-06 DIAGNOSIS — E785 Hyperlipidemia, unspecified: Secondary | ICD-10-CM | POA: Diagnosis not present

## 2020-08-06 DIAGNOSIS — E1122 Type 2 diabetes mellitus with diabetic chronic kidney disease: Secondary | ICD-10-CM | POA: Diagnosis not present

## 2020-08-06 DIAGNOSIS — Z951 Presence of aortocoronary bypass graft: Secondary | ICD-10-CM | POA: Diagnosis not present

## 2020-08-06 DIAGNOSIS — R569 Unspecified convulsions: Secondary | ICD-10-CM | POA: Diagnosis not present

## 2020-08-06 DIAGNOSIS — N400 Enlarged prostate without lower urinary tract symptoms: Secondary | ICD-10-CM | POA: Diagnosis not present

## 2020-08-06 DIAGNOSIS — Z992 Dependence on renal dialysis: Secondary | ICD-10-CM | POA: Diagnosis not present

## 2020-08-06 DIAGNOSIS — I120 Hypertensive chronic kidney disease with stage 5 chronic kidney disease or end stage renal disease: Secondary | ICD-10-CM | POA: Diagnosis not present

## 2020-08-06 DIAGNOSIS — K219 Gastro-esophageal reflux disease without esophagitis: Secondary | ICD-10-CM | POA: Diagnosis not present

## 2020-08-06 DIAGNOSIS — B182 Chronic viral hepatitis C: Secondary | ICD-10-CM | POA: Diagnosis not present

## 2020-08-08 DIAGNOSIS — D631 Anemia in chronic kidney disease: Secondary | ICD-10-CM | POA: Diagnosis not present

## 2020-08-08 DIAGNOSIS — N2581 Secondary hyperparathyroidism of renal origin: Secondary | ICD-10-CM | POA: Diagnosis not present

## 2020-08-08 DIAGNOSIS — N186 End stage renal disease: Secondary | ICD-10-CM | POA: Diagnosis not present

## 2020-08-09 DIAGNOSIS — N186 End stage renal disease: Secondary | ICD-10-CM | POA: Diagnosis not present

## 2020-08-09 DIAGNOSIS — D631 Anemia in chronic kidney disease: Secondary | ICD-10-CM | POA: Diagnosis not present

## 2020-08-09 DIAGNOSIS — I120 Hypertensive chronic kidney disease with stage 5 chronic kidney disease or end stage renal disease: Secondary | ICD-10-CM | POA: Diagnosis not present

## 2020-08-09 DIAGNOSIS — E1122 Type 2 diabetes mellitus with diabetic chronic kidney disease: Secondary | ICD-10-CM | POA: Diagnosis not present

## 2020-08-09 DIAGNOSIS — N179 Acute kidney failure, unspecified: Secondary | ICD-10-CM | POA: Diagnosis not present

## 2020-08-09 DIAGNOSIS — J449 Chronic obstructive pulmonary disease, unspecified: Secondary | ICD-10-CM | POA: Diagnosis not present

## 2020-08-10 DIAGNOSIS — Z992 Dependence on renal dialysis: Secondary | ICD-10-CM | POA: Insufficient documentation

## 2020-08-10 DIAGNOSIS — N2581 Secondary hyperparathyroidism of renal origin: Secondary | ICD-10-CM | POA: Diagnosis not present

## 2020-08-10 DIAGNOSIS — E039 Hypothyroidism, unspecified: Secondary | ICD-10-CM | POA: Insufficient documentation

## 2020-08-10 DIAGNOSIS — N400 Enlarged prostate without lower urinary tract symptoms: Secondary | ICD-10-CM | POA: Insufficient documentation

## 2020-08-10 DIAGNOSIS — J449 Chronic obstructive pulmonary disease, unspecified: Secondary | ICD-10-CM | POA: Insufficient documentation

## 2020-08-10 DIAGNOSIS — C44309 Unspecified malignant neoplasm of skin of other parts of face: Secondary | ICD-10-CM | POA: Insufficient documentation

## 2020-08-10 DIAGNOSIS — T8610 Unspecified complication of kidney transplant: Secondary | ICD-10-CM | POA: Insufficient documentation

## 2020-08-10 DIAGNOSIS — R52 Pain, unspecified: Secondary | ICD-10-CM | POA: Insufficient documentation

## 2020-08-10 DIAGNOSIS — N186 End stage renal disease: Secondary | ICD-10-CM | POA: Diagnosis not present

## 2020-08-10 DIAGNOSIS — T782XXA Anaphylactic shock, unspecified, initial encounter: Secondary | ICD-10-CM | POA: Insufficient documentation

## 2020-08-10 DIAGNOSIS — D631 Anemia in chronic kidney disease: Secondary | ICD-10-CM | POA: Diagnosis not present

## 2020-08-10 DIAGNOSIS — E1122 Type 2 diabetes mellitus with diabetic chronic kidney disease: Secondary | ICD-10-CM | POA: Diagnosis not present

## 2020-08-10 DIAGNOSIS — G4089 Other seizures: Secondary | ICD-10-CM | POA: Insufficient documentation

## 2020-08-10 DIAGNOSIS — N189 Chronic kidney disease, unspecified: Secondary | ICD-10-CM | POA: Insufficient documentation

## 2020-08-10 DIAGNOSIS — T7840XA Allergy, unspecified, initial encounter: Secondary | ICD-10-CM | POA: Insufficient documentation

## 2020-08-11 DIAGNOSIS — D631 Anemia in chronic kidney disease: Secondary | ICD-10-CM | POA: Diagnosis not present

## 2020-08-11 DIAGNOSIS — N186 End stage renal disease: Secondary | ICD-10-CM | POA: Diagnosis not present

## 2020-08-11 DIAGNOSIS — E1122 Type 2 diabetes mellitus with diabetic chronic kidney disease: Secondary | ICD-10-CM | POA: Diagnosis not present

## 2020-08-11 DIAGNOSIS — N179 Acute kidney failure, unspecified: Secondary | ICD-10-CM | POA: Diagnosis not present

## 2020-08-11 DIAGNOSIS — J449 Chronic obstructive pulmonary disease, unspecified: Secondary | ICD-10-CM | POA: Diagnosis not present

## 2020-08-11 DIAGNOSIS — I120 Hypertensive chronic kidney disease with stage 5 chronic kidney disease or end stage renal disease: Secondary | ICD-10-CM | POA: Diagnosis not present

## 2020-08-12 DIAGNOSIS — N2581 Secondary hyperparathyroidism of renal origin: Secondary | ICD-10-CM | POA: Diagnosis not present

## 2020-08-12 DIAGNOSIS — N186 End stage renal disease: Secondary | ICD-10-CM | POA: Diagnosis not present

## 2020-08-12 DIAGNOSIS — D631 Anemia in chronic kidney disease: Secondary | ICD-10-CM | POA: Diagnosis not present

## 2020-08-13 DIAGNOSIS — Z794 Long term (current) use of insulin: Secondary | ICD-10-CM | POA: Diagnosis not present

## 2020-08-13 DIAGNOSIS — Z951 Presence of aortocoronary bypass graft: Secondary | ICD-10-CM | POA: Diagnosis not present

## 2020-08-13 DIAGNOSIS — M25511 Pain in right shoulder: Secondary | ICD-10-CM | POA: Diagnosis not present

## 2020-08-13 DIAGNOSIS — R0902 Hypoxemia: Secondary | ICD-10-CM | POA: Diagnosis not present

## 2020-08-13 DIAGNOSIS — S2241XA Multiple fractures of ribs, right side, initial encounter for closed fracture: Secondary | ICD-10-CM | POA: Diagnosis not present

## 2020-08-13 DIAGNOSIS — W19XXXA Unspecified fall, initial encounter: Secondary | ICD-10-CM | POA: Diagnosis not present

## 2020-08-13 DIAGNOSIS — J90 Pleural effusion, not elsewhere classified: Secondary | ICD-10-CM | POA: Diagnosis not present

## 2020-08-13 DIAGNOSIS — M19011 Primary osteoarthritis, right shoulder: Secondary | ICD-10-CM | POA: Diagnosis not present

## 2020-08-13 DIAGNOSIS — Z79899 Other long term (current) drug therapy: Secondary | ICD-10-CM | POA: Diagnosis not present

## 2020-08-13 DIAGNOSIS — I120 Hypertensive chronic kidney disease with stage 5 chronic kidney disease or end stage renal disease: Secondary | ICD-10-CM | POA: Diagnosis not present

## 2020-08-13 DIAGNOSIS — W1839XA Other fall on same level, initial encounter: Secondary | ICD-10-CM | POA: Diagnosis not present

## 2020-08-13 DIAGNOSIS — R911 Solitary pulmonary nodule: Secondary | ICD-10-CM | POA: Diagnosis not present

## 2020-08-13 DIAGNOSIS — Z7952 Long term (current) use of systemic steroids: Secondary | ICD-10-CM | POA: Diagnosis not present

## 2020-08-13 DIAGNOSIS — S2249XA Multiple fractures of ribs, unspecified side, initial encounter for closed fracture: Secondary | ICD-10-CM | POA: Insufficient documentation

## 2020-08-13 DIAGNOSIS — R188 Other ascites: Secondary | ICD-10-CM | POA: Diagnosis not present

## 2020-08-13 DIAGNOSIS — Z9889 Other specified postprocedural states: Secondary | ICD-10-CM | POA: Diagnosis not present

## 2020-08-13 DIAGNOSIS — K8020 Calculus of gallbladder without cholecystitis without obstruction: Secondary | ICD-10-CM | POA: Diagnosis not present

## 2020-08-13 DIAGNOSIS — F17210 Nicotine dependence, cigarettes, uncomplicated: Secondary | ICD-10-CM | POA: Diagnosis not present

## 2020-08-13 DIAGNOSIS — R109 Unspecified abdominal pain: Secondary | ICD-10-CM | POA: Diagnosis not present

## 2020-08-13 DIAGNOSIS — Z20822 Contact with and (suspected) exposure to covid-19: Secondary | ICD-10-CM | POA: Diagnosis not present

## 2020-08-13 DIAGNOSIS — N186 End stage renal disease: Secondary | ICD-10-CM | POA: Diagnosis not present

## 2020-08-13 DIAGNOSIS — Y998 Other external cause status: Secondary | ICD-10-CM | POA: Diagnosis not present

## 2020-08-13 DIAGNOSIS — Z602 Problems related to living alone: Secondary | ICD-10-CM | POA: Diagnosis not present

## 2020-08-13 DIAGNOSIS — Z940 Kidney transplant status: Secondary | ICD-10-CM | POA: Diagnosis not present

## 2020-08-13 DIAGNOSIS — I10 Essential (primary) hypertension: Secondary | ICD-10-CM | POA: Diagnosis not present

## 2020-08-13 DIAGNOSIS — D631 Anemia in chronic kidney disease: Secondary | ICD-10-CM | POA: Diagnosis not present

## 2020-08-13 DIAGNOSIS — I700 Atherosclerosis of aorta: Secondary | ICD-10-CM | POA: Diagnosis not present

## 2020-08-13 DIAGNOSIS — I2510 Atherosclerotic heart disease of native coronary artery without angina pectoris: Secondary | ICD-10-CM | POA: Diagnosis not present

## 2020-08-13 DIAGNOSIS — E1122 Type 2 diabetes mellitus with diabetic chronic kidney disease: Secondary | ICD-10-CM | POA: Diagnosis not present

## 2020-08-13 DIAGNOSIS — M25519 Pain in unspecified shoulder: Secondary | ICD-10-CM | POA: Diagnosis not present

## 2020-08-13 DIAGNOSIS — Z992 Dependence on renal dialysis: Secondary | ICD-10-CM | POA: Diagnosis not present

## 2020-08-14 DIAGNOSIS — R279 Unspecified lack of coordination: Secondary | ICD-10-CM | POA: Diagnosis not present

## 2020-08-14 DIAGNOSIS — I700 Atherosclerosis of aorta: Secondary | ICD-10-CM | POA: Diagnosis not present

## 2020-08-14 DIAGNOSIS — N2581 Secondary hyperparathyroidism of renal origin: Secondary | ICD-10-CM | POA: Diagnosis not present

## 2020-08-14 DIAGNOSIS — M25511 Pain in right shoulder: Secondary | ICD-10-CM | POA: Diagnosis not present

## 2020-08-14 DIAGNOSIS — R410 Disorientation, unspecified: Secondary | ICD-10-CM | POA: Diagnosis not present

## 2020-08-14 DIAGNOSIS — Z4789 Encounter for other orthopedic aftercare: Secondary | ICD-10-CM | POA: Diagnosis not present

## 2020-08-14 DIAGNOSIS — Z23 Encounter for immunization: Secondary | ICD-10-CM | POA: Diagnosis not present

## 2020-08-14 DIAGNOSIS — Z992 Dependence on renal dialysis: Secondary | ICD-10-CM | POA: Diagnosis not present

## 2020-08-14 DIAGNOSIS — N186 End stage renal disease: Secondary | ICD-10-CM | POA: Diagnosis not present

## 2020-08-14 DIAGNOSIS — N184 Chronic kidney disease, stage 4 (severe): Secondary | ICD-10-CM | POA: Diagnosis not present

## 2020-08-14 DIAGNOSIS — E1122 Type 2 diabetes mellitus with diabetic chronic kidney disease: Secondary | ICD-10-CM | POA: Diagnosis not present

## 2020-08-14 DIAGNOSIS — D509 Iron deficiency anemia, unspecified: Secondary | ICD-10-CM | POA: Diagnosis not present

## 2020-08-14 DIAGNOSIS — T8610 Unspecified complication of kidney transplant: Secondary | ICD-10-CM | POA: Diagnosis not present

## 2020-08-14 DIAGNOSIS — J90 Pleural effusion, not elsewhere classified: Secondary | ICD-10-CM | POA: Diagnosis not present

## 2020-08-14 DIAGNOSIS — E876 Hypokalemia: Secondary | ICD-10-CM | POA: Diagnosis not present

## 2020-08-14 DIAGNOSIS — Z940 Kidney transplant status: Secondary | ICD-10-CM | POA: Diagnosis not present

## 2020-08-14 DIAGNOSIS — R278 Other lack of coordination: Secondary | ICD-10-CM | POA: Diagnosis not present

## 2020-08-14 DIAGNOSIS — M6281 Muscle weakness (generalized): Secondary | ICD-10-CM | POA: Diagnosis not present

## 2020-08-14 DIAGNOSIS — E119 Type 2 diabetes mellitus without complications: Secondary | ICD-10-CM | POA: Diagnosis not present

## 2020-08-14 DIAGNOSIS — D631 Anemia in chronic kidney disease: Secondary | ICD-10-CM | POA: Diagnosis not present

## 2020-08-14 DIAGNOSIS — Z743 Need for continuous supervision: Secondary | ICD-10-CM | POA: Diagnosis not present

## 2020-08-14 DIAGNOSIS — R188 Other ascites: Secondary | ICD-10-CM | POA: Diagnosis not present

## 2020-08-14 DIAGNOSIS — R911 Solitary pulmonary nodule: Secondary | ICD-10-CM | POA: Diagnosis not present

## 2020-08-14 DIAGNOSIS — S2241XD Multiple fractures of ribs, right side, subsequent encounter for fracture with routine healing: Secondary | ICD-10-CM | POA: Diagnosis not present

## 2020-08-14 DIAGNOSIS — R918 Other nonspecific abnormal finding of lung field: Secondary | ICD-10-CM | POA: Diagnosis not present

## 2020-08-14 DIAGNOSIS — K8020 Calculus of gallbladder without cholecystitis without obstruction: Secondary | ICD-10-CM | POA: Diagnosis not present

## 2020-08-14 DIAGNOSIS — S2249XA Multiple fractures of ribs, unspecified side, initial encounter for closed fracture: Secondary | ICD-10-CM | POA: Diagnosis not present

## 2020-08-14 DIAGNOSIS — R2681 Unsteadiness on feet: Secondary | ICD-10-CM | POA: Diagnosis not present

## 2020-08-14 DIAGNOSIS — S2241XA Multiple fractures of ribs, right side, initial encounter for closed fracture: Secondary | ICD-10-CM | POA: Diagnosis not present

## 2020-08-14 DIAGNOSIS — I2510 Atherosclerotic heart disease of native coronary artery without angina pectoris: Secondary | ICD-10-CM | POA: Diagnosis not present

## 2020-08-15 DIAGNOSIS — R911 Solitary pulmonary nodule: Secondary | ICD-10-CM | POA: Diagnosis not present

## 2020-08-15 DIAGNOSIS — Z992 Dependence on renal dialysis: Secondary | ICD-10-CM | POA: Diagnosis not present

## 2020-08-15 DIAGNOSIS — D509 Iron deficiency anemia, unspecified: Secondary | ICD-10-CM | POA: Diagnosis not present

## 2020-08-15 DIAGNOSIS — S2249XA Multiple fractures of ribs, unspecified side, initial encounter for closed fracture: Secondary | ICD-10-CM | POA: Diagnosis not present

## 2020-08-15 DIAGNOSIS — D631 Anemia in chronic kidney disease: Secondary | ICD-10-CM | POA: Diagnosis not present

## 2020-08-15 DIAGNOSIS — N186 End stage renal disease: Secondary | ICD-10-CM | POA: Diagnosis not present

## 2020-08-15 DIAGNOSIS — N2581 Secondary hyperparathyroidism of renal origin: Secondary | ICD-10-CM | POA: Diagnosis not present

## 2020-08-15 DIAGNOSIS — E119 Type 2 diabetes mellitus without complications: Secondary | ICD-10-CM | POA: Diagnosis not present

## 2020-08-15 DIAGNOSIS — E876 Hypokalemia: Secondary | ICD-10-CM | POA: Diagnosis not present

## 2020-08-15 DIAGNOSIS — T8610 Unspecified complication of kidney transplant: Secondary | ICD-10-CM | POA: Diagnosis not present

## 2020-08-16 ENCOUNTER — Other Ambulatory Visit: Payer: Self-pay | Admitting: Legal Medicine

## 2020-08-16 ENCOUNTER — Telehealth: Payer: Self-pay

## 2020-08-16 DIAGNOSIS — J984 Other disorders of lung: Secondary | ICD-10-CM

## 2020-08-16 DIAGNOSIS — F17291 Nicotine dependence, other tobacco product, in remission: Secondary | ICD-10-CM | POA: Insufficient documentation

## 2020-08-16 NOTE — Telephone Encounter (Signed)
Dr. Henrene Pastor this is just an FYI.  Pt is coming in to be seen this Friday 08/18/2020. Pt was recently in hospital for broken ribs and when they did the scan they seen a 3 cm spot on lung and was told to follow up with PCP for maybe a pulmonologist referral.

## 2020-08-16 NOTE — Addendum Note (Signed)
Addended by: Lucila Maine on: 08/16/2020 03:54 PM   Modules accepted: Orders

## 2020-08-16 NOTE — Telephone Encounter (Signed)
Pulmonary Referral placed to Dr. Gardiner Rhyme.

## 2020-08-16 NOTE — Telephone Encounter (Signed)
I have not seen patient since hospitalization.  He was suppose to follow up so we could evaluate him for further testing..  We can refer to Dr. Alcide Clever in town if you wish.  He needs workup of his lung mass. Refer to Dr. Alcide Clever lp

## 2020-08-17 DIAGNOSIS — D631 Anemia in chronic kidney disease: Secondary | ICD-10-CM | POA: Diagnosis not present

## 2020-08-17 DIAGNOSIS — N186 End stage renal disease: Secondary | ICD-10-CM | POA: Diagnosis not present

## 2020-08-17 DIAGNOSIS — Z992 Dependence on renal dialysis: Secondary | ICD-10-CM | POA: Diagnosis not present

## 2020-08-17 DIAGNOSIS — D509 Iron deficiency anemia, unspecified: Secondary | ICD-10-CM | POA: Diagnosis not present

## 2020-08-17 DIAGNOSIS — E876 Hypokalemia: Secondary | ICD-10-CM | POA: Diagnosis not present

## 2020-08-17 DIAGNOSIS — N2581 Secondary hyperparathyroidism of renal origin: Secondary | ICD-10-CM | POA: Diagnosis not present

## 2020-08-18 ENCOUNTER — Other Ambulatory Visit: Payer: Self-pay

## 2020-08-18 ENCOUNTER — Encounter: Payer: Self-pay | Admitting: Legal Medicine

## 2020-08-18 ENCOUNTER — Ambulatory Visit (INDEPENDENT_AMBULATORY_CARE_PROVIDER_SITE_OTHER): Payer: Medicare Other | Admitting: Legal Medicine

## 2020-08-18 VITALS — BP 100/30 | HR 122 | Temp 97.5°F | Resp 18 | Ht 64.5 in | Wt 170.0 lb

## 2020-08-18 DIAGNOSIS — R918 Other nonspecific abnormal finding of lung field: Secondary | ICD-10-CM

## 2020-08-18 DIAGNOSIS — D509 Iron deficiency anemia, unspecified: Secondary | ICD-10-CM | POA: Insufficient documentation

## 2020-08-18 NOTE — Progress Notes (Signed)
Acute Office Visit  Subjective:    Patient ID: Victor Schultz, male    DOB: [birth year], 69 y.o.   MRN: [id number]  Chief Complaint  Patient presents with  . Lung Lesion    Patient is in nursing rehabilitation. He went to ED 08/13/2020 and they found a nodule on  his left lung    HPI Patient is in today for for transition of care and reconciliation of medicines.  Patient has fractures right 8, 9, 10 from fall and admitted 08/13/2020 at [facility].  He was treated with pain medicines.  A new LLL mass found and needs PET scan, high likelihood for malignancy.  Patient has stopped smoking.  He was discharged on 08/14/2020  He lives at [facility] for rehab for 2 weeks.  Patient has stag 4 to 5 renal disease despite renal transplant in 1982 he is on dialysis and remains on imuran and prednisone.  Patient present with type 2 diabetes.  Specifically, this is type 2, insulin requiring diabetes, complicated by renal failure.  Compliance with treatment has been good; patient take medicines as directed, maintains diet and exercise regimen, follows up as directed, and is keeping glucose diary.  Date of  diagnosis 2010.  Depression screen has been performed.Tobacco screen recent nonsmoker. Current medicines for diabetes lantus 30 units.  Patient is on dialysis for renal protection and atorvastatin for cholesterol control.  Patient performs foot exams daily and last ophthalmologic exam was no.  Past Medical History:  Diagnosis Date  . Abnormal hemoglobin (Hgb) ([place]) 12/08/2019  . Actinic keratosis 04/15/2020  . Acute upper GI bleeding 12/22/2019  . Anemia 04/15/2020  . Atherosclerotic heart disease of native coronary artery with unstable angina pectoris ([place])   . Atrophy of thyroid (acquired)   . CAD in native artery 02/08/2016  . Cancer ([place])    skin cancer on both arms.  . Chronic hepatitis ([place]) 04/05/2020  . Chronic kidney disease, stage 4 (severe) ([place]) 01/02/2020  .  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 Contact dermatitis and other eczema due to other chemical products 04/15/2020  . Disorder of mineral metabolism, unspecified 06/18/2020  . Dizziness 05/08/2020  . End stage renal disease ([place]) 04/15/2020  . Epigastric abdominal tenderness without rebound tenderness 01/02/2020  . Essential hypertension 12/22/2019  . Gastroesophageal reflux disease without esophagitis 12/06/2019  . Hypertensive heart and chronic kidney disease without heart failure, with stage 5 chronic kidney disease, or end stage renal disease ([place])    ESR,kidney transplant  . Hypertensive heart and renal disease 01/02/2020  . Hypothyroidism   . Kidney transplant status, cadaveric 07/08/2017   Formatting of this note might be different from the original. In the 1970s and again in 1982  . Long term (current) use of insulin ([place])   . Metabolic acidosis 0/93/8182  . Mixed hyperlipidemia   . Obstructive uropathy 06/18/2020  . Personal history of malignant neoplasm of skin 10/09/2010   SQUAMOUS CELL CARCINOMA- R AND L FOREARM, LEFT EAR SQUAMOUS CELL CARCINOMA- R AND L FOREARM, LEFT EAR  Formatting of this note might be different from the original. SQUAMOUS CELL CARCINOMA- R AND L FOREARM, LEFT EAR  . Presence of aortocoronary bypass graft 02/08/2016   Formatting of this note might be different from the original. in Oct of 2013 Formatting of this note might be different from the original. Formatting of this note might be different from the original. in Oct of 2013 Oct of 2013  Formatting of this note might be different from the original. Oct of 2013 Formatting of this note might be different from the original. in Oct of 2013 Formatting of this n  . Pure hypercholesterolemia 06/15/2020  . S/P CABG x 3 02/08/2016   in Oct of 2013 Oct of 2013  Formatting of this note might be different from the original. Oct of 2013  . Serum potassium elevated 12/08/2019  . Short of breath on exertion 07/06/2020  . Squamous  cell carcinoma of skin of ear and external auditory canal 11/16/2010   SCC on left helix treated with Mohs surgery SCC on left helix treated with Mohs surgery  Formatting of this note might be different from the original. Overview:  SCC on left helix treated with Mohs surgery  . Type 2 diabetes mellitus with chronic kidney disease, without long-term current use of insulin ([place]) 12/06/2019  . Unspecified disorder of kidney and ureter 06/15/2020  . Vitamin D deficiency 02/08/2016   In Jan 2017, his vitamin D level was 11 In Jan 2017, his vitamin D level was 86  Formatting of this note might be different from the original. In Jan 2017, his vitamin D level was 11  . Weight gain 07/06/2020  . Wheezing 07/06/2020    Past Surgical History:  Procedure Laterality Date  . BIOPSY  05/24/2020   Procedure: BIOPSY;  Surgeon: [name], MD;  Location: [facility];  Service: Endoscopy;;  . CATARACT EXTRACTION    . CORONARY ARTERY BYPASS GRAFT    . ESOPHAGOGASTRODUODENOSCOPY (EGD) WITH PROPOFOL Left 12/23/2019   Procedure: ESOPHAGOGASTRODUODENOSCOPY (EGD) WITH PROPOFOL;  Surgeon: [name], MD;  Location: [place] [facility];  Service: Endoscopy;  Laterality: Left;  . ESOPHAGOGASTRODUODENOSCOPY (EGD) WITH PROPOFOL N/A 05/24/2020   Procedure: ESOPHAGOGASTRODUODENOSCOPY (EGD) WITH PROPOFOL;  Surgeon: [name], MD;  Location: [facility];  Service: Endoscopy;  Laterality: N/A;  . KIDNEY TRANSPLANT     1974, 1982  . TRANSURETHRAL INCISION OF PROSTATE N/A 05/09/2020   Procedure: TRANSURETHRAL INCISION OF THE PROSTATE (TUIP);  Surgeon: Irine Seal, MD;  Location: [facility];  Service: Urology;  Laterality: N/A;    Family History  Problem Relation Age of Onset  . Liver disease Mother   . Alzheimer's disease Father     Social History   Socioeconomic History  . Marital status: Divorced    Spouse name: Not on file  . Number of children: 1  . Years of education: Not on file  . Highest education level: Not on  file  Occupational History  . Occupation: retired  Tobacco Use  . Smoking status: Former Smoker    Packs/day: 0.50    Years: 39.00    Pack years: 19.50    Types: Cigarettes    Quit date: 08/13/2020    Years since quitting: 0.0  . Smokeless tobacco: Never Used  Vaping Use  . Vaping Use: Never used  Substance and Sexual Activity  . Alcohol use: Never  . Drug use: Never  . Sexual activity: Not Currently  Other Topics Concern  . Not on file  Social History Narrative  . Not on file   Social Determinants of Health   Financial Resource Strain:   . Difficulty of Paying Living Expenses: Not on file  Food Insecurity: No Food Insecurity  . Worried About Charity fundraiser in the Last Year: Never true  . Ran Out of Food in the Last Year: Never true  Transportation Needs: No Transportation Needs  . Lack of Transportation (  Medical): No  . Lack of Transportation (Non-Medical): No  Physical Activity:   . Days of Exercise per Week: Not on file  . Minutes of Exercise per Session: Not on file  Stress:   . Feeling of Stress : Not on file  Social Connections: Socially Isolated  . Frequency of Communication with Friends and Family: More than three times a week  . Frequency of Social Gatherings with Friends and Family: Once a week  . Attends Religious Services: Never  . Active Member of Clubs or Organizations: No  . Attends Archivist Meetings: Never  . Marital Status: Divorced  Human resources officer Violence:   . Fear of Current or Ex-Partner: Not on file  . Emotionally Abused: Not on file  . Physically Abused: Not on file  . Sexually Abused: Not on file    Outpatient Medications Prior to Visit  Medication Sig Dispense Refill  . albuterol (VENTOLIN HFA) 108 (90 Base) MCG/ACT inhaler Inhale into the lungs.    [name] amLODipine (NORVASC) 5 MG tablet Take 5 mg by mouth daily.    [name] atorvastatin (LIPITOR) 20 MG tablet Take 20 mg by mouth daily.    [name] azaTHIOprine (IMURAN) 50 MG tablet  Take 2.5 tablets (125 mg total) by mouth daily. Taking 1.5 tablets in the am and 1 tablets in the evening. (Patient taking differently: Take 50-75 mg by mouth See admin instructions. Take 75 mg in the morning and 50 mg at night) 225 tablet 5  . azithromycin (ZITHROMAX) 250 MG tablet Take two po today then one po day 2-5 6 tablet 0  . B-D UF III MINI PEN NEEDLES 31G X 5 MM MISC USE WITH LANTUS AS DIRECTED    . benzonatate (TESSALON) 200 MG capsule Take 1 capsule (200 mg total) by mouth 2 (two) times daily as needed for cough. 30 capsule 0  . calcitRIOL (ROCALTROL) 0.25 MCG capsule Take 0.25 mcg by mouth daily.    . ferrous sulfate 325 (65 FE) MG tablet Take by mouth.    . insulin glargine (LANTUS SOLOSTAR) 100 UNIT/ML Solostar Pen Inject 30 Units into the skin daily.    [name] levothyroxine (SYNTHROID) 50 MCG tablet Take 1 tablet (50 mcg total) by mouth daily. 90 tablet 0  . LOKELMA 10 g PACK packet Take by mouth.    . metoprolol tartrate (LOPRESSOR) 25 MG tablet Take 0.5 tablets (12.5 mg total) by mouth 2 (two) times daily. (Patient taking differently: Take 25 mg by mouth 2 (two) times daily. ) 90 tablet 0  . pantoprazole (PROTONIX) 40 MG tablet Take 1 tablet (40 mg total) by mouth 2 (two) times daily. (Patient taking differently: Take 40 mg by mouth daily. ) 60 tablet 2  . predniSONE (DELTASONE) 10 MG tablet Take six today with food then 3 po BID x 2 days starting tomorrow, then 2po BID x 3 days then 1 po BID x 3 days then 1 po qd 39 tablet 0  . predniSONE (DELTASONE) 5 MG tablet Take 1 tablet by mouth every other day.    . sevelamer (RENAGEL) 800 MG tablet Take by mouth.    . sodium bicarbonate 650 MG tablet Take 1,300 mg by mouth 2 (two) times daily.     . sucroferric oxyhydroxide (VELPHORO) 500 MG chewable tablet Chew by mouth.    . tamsulosin (FLOMAX) 0.4 MG CAPS capsule Take 0.4 mg by mouth daily.    [name] terazosin (HYTRIN) 1 MG capsule     . tiotropium (SPIRIVA  HANDIHALER) 18 MCG inhalation capsule  Place into inhaler and inhale.    . Vitamin D, Ergocalciferol, (DRISDOL) 1.25 MG (50000 UNIT) CAPS capsule Take 50,000 Units by mouth every Sunday.      No facility-administered medications prior to visit.    No Known Allergies  Review of Systems  Constitutional: Positive for appetite change. Negative for activity change, fatigue and fever.  HENT: Negative for congestion.   Respiratory: Negative for cough, choking, chest tightness and shortness of breath.   Cardiovascular: Positive for chest pain. Negative for palpitations and leg swelling.  Gastrointestinal: Negative.   Genitourinary: Positive for difficulty urinating.  Musculoskeletal: Negative.   Skin: Negative.   Neurological: Negative.   Psychiatric/Behavioral: Negative.        Objective:    Physical Exam Vitals reviewed.  Constitutional:      Appearance: Normal appearance.  HENT:     Head: Normocephalic and atraumatic.     Right Ear: Tympanic membrane normal.     Left Ear: Tympanic membrane normal.  Eyes:     Extraocular Movements: Extraocular movements intact.     Conjunctiva/sclera: Conjunctivae normal.     Pupils: Pupils are equal, round, and reactive to light.  Cardiovascular:     Rate and Rhythm: Normal rate and regular rhythm.     Pulses: Normal pulses.     Heart sounds: Normal heart sounds.     Comments: Midline sternotomy scar Pulmonary:     Effort: Pulmonary effort is normal.     Comments: Right chest wall pain from rib fractures Abdominal:     General: Abdomen is flat. Bowel sounds are normal.     Palpations: Abdomen is soft.  Musculoskeletal:        General: Normal range of motion.     Cervical back: Normal range of motion.  Skin:    General: Skin is warm and dry.     Capillary Refill: Capillary refill takes less than 2 seconds.  Neurological:     General: No focal deficit present.     Mental Status: He is alert and oriented to person, place, and time.  Psychiatric:        Mood and Affect:  Mood normal.        Thought Content: Thought content normal.     BP (!) 100/30   Pulse (!) 122   Temp (!) 97.5 F (36.4 C)   Resp 18   Ht 5' 4.5" (1.638 m)   Wt 170 lb (77.1 kg)   SpO2 94%   BMI 28.73 kg/m  Wt Readings from Last 3 Encounters:  08/18/20 170 lb (77.1 kg)  07/06/20 191 lb 12.8 oz (87 kg)  07/03/20 169 lb (76.7 kg)    Health Maintenance Due  Topic Date Due  . OPHTHALMOLOGY EXAM  Never done  . TETANUS/TDAP  Never done  . PNA vac Low Risk Adult (1 of 2 - PCV13) Never done  . Fecal DNA (Cologuard)  01/18/2020  . INFLUENZA VACCINE  05/07/2020    There are no preventive care reminders to display for this patient.   Lab Results  Component Value Date   TSH 3.310 12/06/2019   Lab Results  Component Value Date   WBC 7.4 05/02/2020   HGB 8.8 (L) 05/24/2020   HCT 26.0 (L) 05/24/2020   MCV 106.8 (H) 05/02/2020   PLT 219 05/02/2020   Lab Results  Component Value Date   NA 145 05/24/2020   K 4.9 05/24/2020   CO2 19 (L) 05/02/2020  GLUCOSE 155 (H) 05/02/2020   BUN 48 (H) 05/02/2020   CREATININE 4.00 (H) 05/02/2020   BILITOT 0.5 04/05/2020   ALKPHOS 89 04/05/2020   AST 23 04/05/2020   ALT 17 04/05/2020   PROT 6.4 04/05/2020   ALBUMIN 2.5 (L) 04/05/2020   CALCIUM 8.0 (L) 05/02/2020   ANIONGAP 6 05/02/2020   Lab Results  Component Value Date   CHOL 167 04/05/2020   Lab Results  Component Value Date   HDL 44 04/05/2020   Lab Results  Component Value Date   LDLCALC 77 04/05/2020   Lab Results  Component Value Date   TRIG 283 (H) 04/05/2020   Lab Results  Component Value Date   CHOLHDL 3.8 04/05/2020   Lab Results  Component Value Date   HGBA1C 6.4 (H) 04/05/2020       Assessment & Plan:  1. Mass of lower lobe of left lung Patient has a mass in left lower lobe and requires Pet scan.  It is  A high probability of malignancy.  Patient is a long term smoker.  Fractured ribs- treated with pain medicines, it is causing less pain and he  is moving well.         I spent 30  dedicated to the care of this patient on the date of this encounter to include face-to-face time with the patient, as well as  Preparing to see the patient (eg, review of tests). Obtaining and/or reviewing separately obtained history. Performing a medically appropriate examination and/or evaluation. Counseling and educating the patient/family/caregiver. Ordering medications, tests, or procedures.  Documenting clinical information in the electronic or other health record. Independently interpreting results (not separately reported) and communicating results to the patient/family/caregiver.   My nursing staff have aided in the documentation of this note on the behalf of Reinaldo Meeker, MD,as directed by  Reinaldo Meeker, MD and thoroughly reviewed by Reinaldo Meeker, MD.  Follow-up: Return in about 2 weeks (around 09/01/2020).  An After Visit Summary was printed and given to the patient.  Reinaldo Meeker, MD [facility] [phone]

## 2020-08-19 DIAGNOSIS — N186 End stage renal disease: Secondary | ICD-10-CM | POA: Diagnosis not present

## 2020-08-19 DIAGNOSIS — D631 Anemia in chronic kidney disease: Secondary | ICD-10-CM | POA: Diagnosis not present

## 2020-08-19 DIAGNOSIS — Z992 Dependence on renal dialysis: Secondary | ICD-10-CM | POA: Diagnosis not present

## 2020-08-19 DIAGNOSIS — N2581 Secondary hyperparathyroidism of renal origin: Secondary | ICD-10-CM | POA: Diagnosis not present

## 2020-08-19 DIAGNOSIS — E876 Hypokalemia: Secondary | ICD-10-CM | POA: Diagnosis not present

## 2020-08-19 DIAGNOSIS — D509 Iron deficiency anemia, unspecified: Secondary | ICD-10-CM | POA: Diagnosis not present

## 2020-08-21 DIAGNOSIS — E440 Moderate protein-calorie malnutrition: Secondary | ICD-10-CM | POA: Insufficient documentation

## 2020-08-22 DIAGNOSIS — E876 Hypokalemia: Secondary | ICD-10-CM | POA: Diagnosis not present

## 2020-08-22 DIAGNOSIS — N186 End stage renal disease: Secondary | ICD-10-CM | POA: Diagnosis not present

## 2020-08-22 DIAGNOSIS — Z992 Dependence on renal dialysis: Secondary | ICD-10-CM | POA: Diagnosis not present

## 2020-08-22 DIAGNOSIS — D631 Anemia in chronic kidney disease: Secondary | ICD-10-CM | POA: Diagnosis not present

## 2020-08-22 DIAGNOSIS — N2581 Secondary hyperparathyroidism of renal origin: Secondary | ICD-10-CM | POA: Diagnosis not present

## 2020-08-22 DIAGNOSIS — D509 Iron deficiency anemia, unspecified: Secondary | ICD-10-CM | POA: Diagnosis not present

## 2020-08-24 DIAGNOSIS — D631 Anemia in chronic kidney disease: Secondary | ICD-10-CM | POA: Diagnosis not present

## 2020-08-24 DIAGNOSIS — Z992 Dependence on renal dialysis: Secondary | ICD-10-CM | POA: Diagnosis not present

## 2020-08-24 DIAGNOSIS — N186 End stage renal disease: Secondary | ICD-10-CM | POA: Diagnosis not present

## 2020-08-24 DIAGNOSIS — D509 Iron deficiency anemia, unspecified: Secondary | ICD-10-CM | POA: Diagnosis not present

## 2020-08-24 DIAGNOSIS — E876 Hypokalemia: Secondary | ICD-10-CM | POA: Diagnosis not present

## 2020-08-24 DIAGNOSIS — R531 Weakness: Secondary | ICD-10-CM | POA: Diagnosis not present

## 2020-08-24 DIAGNOSIS — N2581 Secondary hyperparathyroidism of renal origin: Secondary | ICD-10-CM | POA: Diagnosis not present

## 2020-08-24 DIAGNOSIS — D649 Anemia, unspecified: Secondary | ICD-10-CM | POA: Diagnosis not present

## 2020-08-26 DIAGNOSIS — D509 Iron deficiency anemia, unspecified: Secondary | ICD-10-CM | POA: Diagnosis not present

## 2020-08-26 DIAGNOSIS — N2581 Secondary hyperparathyroidism of renal origin: Secondary | ICD-10-CM | POA: Diagnosis not present

## 2020-08-26 DIAGNOSIS — Z992 Dependence on renal dialysis: Secondary | ICD-10-CM | POA: Diagnosis not present

## 2020-08-26 DIAGNOSIS — N186 End stage renal disease: Secondary | ICD-10-CM | POA: Diagnosis not present

## 2020-08-26 DIAGNOSIS — D631 Anemia in chronic kidney disease: Secondary | ICD-10-CM | POA: Diagnosis not present

## 2020-08-26 DIAGNOSIS — E876 Hypokalemia: Secondary | ICD-10-CM | POA: Diagnosis not present

## 2020-08-27 DIAGNOSIS — N186 End stage renal disease: Secondary | ICD-10-CM | POA: Diagnosis not present

## 2020-08-27 DIAGNOSIS — E1122 Type 2 diabetes mellitus with diabetic chronic kidney disease: Secondary | ICD-10-CM | POA: Diagnosis not present

## 2020-08-27 DIAGNOSIS — D631 Anemia in chronic kidney disease: Secondary | ICD-10-CM | POA: Diagnosis not present

## 2020-08-27 DIAGNOSIS — J449 Chronic obstructive pulmonary disease, unspecified: Secondary | ICD-10-CM | POA: Diagnosis not present

## 2020-08-27 DIAGNOSIS — I120 Hypertensive chronic kidney disease with stage 5 chronic kidney disease or end stage renal disease: Secondary | ICD-10-CM | POA: Diagnosis not present

## 2020-08-27 DIAGNOSIS — N179 Acute kidney failure, unspecified: Secondary | ICD-10-CM | POA: Diagnosis not present

## 2020-08-28 ENCOUNTER — Telehealth: Payer: Self-pay

## 2020-08-28 DIAGNOSIS — E876 Hypokalemia: Secondary | ICD-10-CM | POA: Diagnosis not present

## 2020-08-28 DIAGNOSIS — N186 End stage renal disease: Secondary | ICD-10-CM | POA: Diagnosis not present

## 2020-08-28 DIAGNOSIS — Z992 Dependence on renal dialysis: Secondary | ICD-10-CM | POA: Diagnosis not present

## 2020-08-28 DIAGNOSIS — D509 Iron deficiency anemia, unspecified: Secondary | ICD-10-CM | POA: Diagnosis not present

## 2020-08-28 DIAGNOSIS — N2581 Secondary hyperparathyroidism of renal origin: Secondary | ICD-10-CM | POA: Diagnosis not present

## 2020-08-28 DIAGNOSIS — D631 Anemia in chronic kidney disease: Secondary | ICD-10-CM | POA: Diagnosis not present

## 2020-08-28 NOTE — Telephone Encounter (Signed)
LM on both of the pts phone requesting him to call back to rs his apt. Apt with Dr. Henrene Pastor had a family emergency and will be out of the office.

## 2020-08-29 ENCOUNTER — Other Ambulatory Visit: Payer: Self-pay

## 2020-08-30 ENCOUNTER — Ambulatory Visit: Payer: Medicare Other | Admitting: Legal Medicine

## 2020-08-30 DIAGNOSIS — D631 Anemia in chronic kidney disease: Secondary | ICD-10-CM | POA: Diagnosis not present

## 2020-08-30 DIAGNOSIS — Z992 Dependence on renal dialysis: Secondary | ICD-10-CM | POA: Diagnosis not present

## 2020-08-30 DIAGNOSIS — D509 Iron deficiency anemia, unspecified: Secondary | ICD-10-CM | POA: Diagnosis not present

## 2020-08-30 DIAGNOSIS — N2581 Secondary hyperparathyroidism of renal origin: Secondary | ICD-10-CM | POA: Diagnosis not present

## 2020-08-30 DIAGNOSIS — N186 End stage renal disease: Secondary | ICD-10-CM | POA: Diagnosis not present

## 2020-08-30 DIAGNOSIS — E876 Hypokalemia: Secondary | ICD-10-CM | POA: Diagnosis not present

## 2020-09-01 DIAGNOSIS — J449 Chronic obstructive pulmonary disease, unspecified: Secondary | ICD-10-CM | POA: Diagnosis not present

## 2020-09-01 DIAGNOSIS — I120 Hypertensive chronic kidney disease with stage 5 chronic kidney disease or end stage renal disease: Secondary | ICD-10-CM | POA: Diagnosis not present

## 2020-09-01 DIAGNOSIS — N186 End stage renal disease: Secondary | ICD-10-CM | POA: Diagnosis not present

## 2020-09-01 DIAGNOSIS — N179 Acute kidney failure, unspecified: Secondary | ICD-10-CM | POA: Diagnosis not present

## 2020-09-01 DIAGNOSIS — E1122 Type 2 diabetes mellitus with diabetic chronic kidney disease: Secondary | ICD-10-CM | POA: Diagnosis not present

## 2020-09-01 DIAGNOSIS — D631 Anemia in chronic kidney disease: Secondary | ICD-10-CM | POA: Diagnosis not present

## 2020-09-02 DIAGNOSIS — N186 End stage renal disease: Secondary | ICD-10-CM | POA: Diagnosis not present

## 2020-09-02 DIAGNOSIS — D631 Anemia in chronic kidney disease: Secondary | ICD-10-CM | POA: Diagnosis not present

## 2020-09-02 DIAGNOSIS — N2581 Secondary hyperparathyroidism of renal origin: Secondary | ICD-10-CM | POA: Diagnosis not present

## 2020-09-02 DIAGNOSIS — E876 Hypokalemia: Secondary | ICD-10-CM | POA: Diagnosis not present

## 2020-09-02 DIAGNOSIS — D509 Iron deficiency anemia, unspecified: Secondary | ICD-10-CM | POA: Diagnosis not present

## 2020-09-02 DIAGNOSIS — Z992 Dependence on renal dialysis: Secondary | ICD-10-CM | POA: Diagnosis not present

## 2020-09-04 DIAGNOSIS — N186 End stage renal disease: Secondary | ICD-10-CM | POA: Diagnosis not present

## 2020-09-04 DIAGNOSIS — E1122 Type 2 diabetes mellitus with diabetic chronic kidney disease: Secondary | ICD-10-CM | POA: Diagnosis not present

## 2020-09-04 DIAGNOSIS — I120 Hypertensive chronic kidney disease with stage 5 chronic kidney disease or end stage renal disease: Secondary | ICD-10-CM | POA: Diagnosis not present

## 2020-09-04 DIAGNOSIS — D631 Anemia in chronic kidney disease: Secondary | ICD-10-CM | POA: Diagnosis not present

## 2020-09-04 DIAGNOSIS — J449 Chronic obstructive pulmonary disease, unspecified: Secondary | ICD-10-CM | POA: Diagnosis not present

## 2020-09-04 DIAGNOSIS — N179 Acute kidney failure, unspecified: Secondary | ICD-10-CM | POA: Diagnosis not present

## 2020-09-05 ENCOUNTER — Telehealth: Payer: Self-pay

## 2020-09-05 DIAGNOSIS — N2581 Secondary hyperparathyroidism of renal origin: Secondary | ICD-10-CM | POA: Diagnosis not present

## 2020-09-05 DIAGNOSIS — E1122 Type 2 diabetes mellitus with diabetic chronic kidney disease: Secondary | ICD-10-CM | POA: Diagnosis not present

## 2020-09-05 DIAGNOSIS — J441 Chronic obstructive pulmonary disease with (acute) exacerbation: Secondary | ICD-10-CM | POA: Diagnosis not present

## 2020-09-05 DIAGNOSIS — N179 Acute kidney failure, unspecified: Secondary | ICD-10-CM | POA: Diagnosis not present

## 2020-09-05 DIAGNOSIS — D631 Anemia in chronic kidney disease: Secondary | ICD-10-CM | POA: Diagnosis not present

## 2020-09-05 DIAGNOSIS — E039 Hypothyroidism, unspecified: Secondary | ICD-10-CM | POA: Diagnosis not present

## 2020-09-05 DIAGNOSIS — I120 Hypertensive chronic kidney disease with stage 5 chronic kidney disease or end stage renal disease: Secondary | ICD-10-CM | POA: Diagnosis not present

## 2020-09-05 DIAGNOSIS — N400 Enlarged prostate without lower urinary tract symptoms: Secondary | ICD-10-CM | POA: Diagnosis not present

## 2020-09-05 DIAGNOSIS — B182 Chronic viral hepatitis C: Secondary | ICD-10-CM | POA: Diagnosis not present

## 2020-09-05 DIAGNOSIS — Z950 Presence of cardiac pacemaker: Secondary | ICD-10-CM | POA: Diagnosis not present

## 2020-09-05 DIAGNOSIS — N186 End stage renal disease: Secondary | ICD-10-CM | POA: Diagnosis not present

## 2020-09-05 DIAGNOSIS — F17210 Nicotine dependence, cigarettes, uncomplicated: Secondary | ICD-10-CM | POA: Diagnosis not present

## 2020-09-05 DIAGNOSIS — Z951 Presence of aortocoronary bypass graft: Secondary | ICD-10-CM | POA: Diagnosis not present

## 2020-09-05 DIAGNOSIS — Z992 Dependence on renal dialysis: Secondary | ICD-10-CM | POA: Diagnosis not present

## 2020-09-05 DIAGNOSIS — K219 Gastro-esophageal reflux disease without esophagitis: Secondary | ICD-10-CM | POA: Diagnosis not present

## 2020-09-05 DIAGNOSIS — E876 Hypokalemia: Secondary | ICD-10-CM | POA: Diagnosis not present

## 2020-09-05 DIAGNOSIS — E1165 Type 2 diabetes mellitus with hyperglycemia: Secondary | ICD-10-CM | POA: Diagnosis not present

## 2020-09-05 DIAGNOSIS — I2510 Atherosclerotic heart disease of native coronary artery without angina pectoris: Secondary | ICD-10-CM | POA: Diagnosis not present

## 2020-09-05 DIAGNOSIS — D509 Iron deficiency anemia, unspecified: Secondary | ICD-10-CM | POA: Diagnosis not present

## 2020-09-05 DIAGNOSIS — E785 Hyperlipidemia, unspecified: Secondary | ICD-10-CM | POA: Diagnosis not present

## 2020-09-05 DIAGNOSIS — Z7952 Long term (current) use of systemic steroids: Secondary | ICD-10-CM | POA: Diagnosis not present

## 2020-09-05 DIAGNOSIS — Z794 Long term (current) use of insulin: Secondary | ICD-10-CM | POA: Diagnosis not present

## 2020-09-05 NOTE — Chronic Care Management (AMB) (Signed)
Chronic Care Management Pharmacy Assistant   Name: Victor Schultz  MRN: [id number] DOB: 23-Oct-1950  Reason for Encounter: Disease State  Patient Questions:  1.  Have you seen any other providers since your last visit? Yes 08/18/20 - Dr. Cassell Clement. Hospital follow up  08/13/20 - ED - [facility]. Right shoulder pain multiple rib fractures 07/07/20 - ED - Shortness of breath.  07/06/20 - Dr. Benny Lennert - PCP - Wheezing 07/03/20 - Video Visit Dr Benny Lennert- Shortness of breath 06/15/20 - Dr. Glean [facility]ph[facility]- No changes 06/06/20- Dr. Benny Lennert- PCP- Annual wellness exam.  05/24/20 - Dr. [name] - Procedure: ESOPHAGOGASTRODUODENOSCOPY  05/09/20 - Dr. Irine Seal - Urology- Procedure :TRANSURETHRAL INCISION OF THE PROSTATE  05/08/20- Dr. Cassell Clement- PCP- No changes   2.  Any changes in your medicines or health? Yes    PCP : Lillard Anes, MD  Allergies:  No Known Allergies  Medications: Outpatient Encounter Medications as of 09/05/2020  Medication Sig  . albuterol (VENTOLIN HFA) 108 (90 Base) MCG/ACT inhaler Inhale into the lungs.  [name] amLODipine (NORVASC) 5 MG tablet Take 5 mg by mouth daily.  [name] atorvastatin (LIPITOR) 20 MG tablet Take 20 mg by mouth daily.  [name] azaTHIOprine (IMURAN) 50 MG tablet Take 2.5 tablets (125 mg total) by mouth daily. Taking 1.5 tablets in the am and 1 tablets in the evening. (Patient taking differently: Take 50-75 mg by mouth See admin instructions. Take 75 mg in the morning and 50 mg at night)  . azithromycin (ZITHROMAX) 250 MG tablet Take two po today then one po day 2-5  . B-D UF III MINI PEN NEEDLES 31G X 5 MM MISC USE WITH LANTUS AS DIRECTED  . benzonatate (TESSALON) 200 MG capsule Take 1 capsule (200 mg total) by mouth 2 (two) times daily as needed for cough.  . calcitRIOL (ROCALTROL) 0.25 MCG capsule Take 0.25 mcg by mouth daily.  . ferrous sulfate 325 (65 FE) MG tablet Take by mouth.  [name] HYDROcodone-acetaminophen (NORCO/VICODIN) 5-325 MG tablet Take  by mouth.  . insulin glargine (LANTUS SOLOSTAR) 100 UNIT/ML Solostar Pen Inject 30 Units into the skin daily.  [name] LEVEMIR FLEXTOUCH 100 UNIT/ML FlexPen Inject into the skin.  [name] levothyroxine (SYNTHROID) 50 MCG tablet Take 1 tablet (50 mcg total) by mouth daily.  [name] LOKELMA 10 g PACK packet Take by mouth.  . metoprolol tartrate (LOPRESSOR) 25 MG tablet Take 0.5 tablets (12.5 mg total) by mouth 2 (two) times daily. (Patient taking differently: Take 25 mg by mouth 2 (two) times daily. )  . pantoprazole (PROTONIX) 40 MG tablet Take 1 tablet (40 mg total) by mouth 2 (two) times daily. (Patient taking differently: Take 40 mg by mouth daily. )  . predniSONE (DELTASONE) 10 MG tablet Take six today with food then 3 po BID x 2 days starting tomorrow, then 2po BID x 3 days then 1 po BID x 3 days then 1 po qd  . predniSONE (DELTASONE) 5 MG tablet Take 1 tablet by mouth every other day.  . sevelamer (RENAGEL) 800 MG tablet Take by mouth.  . sevelamer carbonate (RENVELA) 800 MG tablet Take by mouth.  . sodium bicarbonate 650 MG tablet Take 1,300 mg by mouth 2 (two) times daily.   . sucroferric oxyhydroxide (VELPHORO) 500 MG chewable tablet Chew by mouth.  . tamsulosin (FLOMAX) 0.4 MG CAPS capsule Take 0.4 mg by mouth daily.  [name] terazosin (HYTRIN) 1 MG capsule   . tiotropium (SPIRIVA  HANDIHALER) 18 MCG inhalation capsule Place into inhaler and inhale.  . traZODone (DESYREL) 50 MG tablet   . Vitamin D, Ergocalciferol, (DRISDOL) 1.25 MG (50000 UNIT) CAPS capsule Take 50,000 Units by mouth every Sunday.    No facility-administered encounter medications on file as of 09/05/2020.    Current Diagnosis: Patient Active Problem List   Diagnosis Date Noted  . Wheezing 07/06/2020  . Weight gain 07/06/2020  . Short of breath on exertion 07/06/2020  . Acute bronchitis with COPD ([place]) 07/06/2020  . Disorder of mineral metabolism, unspecified 06/18/2020  . Metabolic acidosis 61/60/7371  . Obstructive uropathy  06/18/2020  . Pure hypercholesterolemia 06/15/2020  . Unspecified disorder of kidney and ureter 06/15/2020  . Dizziness 05/08/2020  . Actinic keratosis 04/15/2020  . Anemia 04/15/2020  . Contact dermatitis and other eczema due to other chemical products 04/15/2020  . End stage renal disease ([place]) 04/15/2020  . Chronic hepatitis ([place]) 04/05/2020  . Restrictive lung disease 04/05/2020  . BMI 28.0-28.9,adult 04/05/2020  . Epigastric abdominal tenderness without rebound tenderness 01/02/2020  . Chronic kidney disease, stage 4 (severe) ([place]) 01/02/2020  . Hypertensive heart and renal disease 01/02/2020  . Acute upper GI bleeding 12/22/2019  . Essential hypertension 12/22/2019  . Abnormal hemoglobin (Hgb) ([place]) 12/08/2019  . Serum potassium elevated 12/08/2019  . Type 2 diabetes mellitus with chronic kidney disease, without long-term current use of insulin ([place]) 12/06/2019  . Mixed hyperlipidemia 12/06/2019  . Gastroesophageal reflux disease without esophagitis 12/06/2019  . Kidney transplant status, cadaveric 07/08/2017  . CAD in native artery 02/08/2016  . Current every day smoker 02/08/2016  . S/P CABG x 3 02/08/2016  . Vitamin D deficiency 02/08/2016  . Presence of aortocoronary bypass graft 02/08/2016  . 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 Squamous cell carcinoma of skin of ear and external auditory canal 11/16/2010  . Personal history of malignant neoplasm of skin 10/09/2010   Reviewed chart prior to disease state call. Spoke with patient regarding BP  Recent Office Vitals: BP Readings from Last 3 Encounters:  08/18/20 (!) 100/30  07/06/20 126/68  06/06/20 (!) 154/64   Pulse Readings from Last 3 Encounters:  08/18/20 (!) 122  07/06/20 91  06/06/20 79    Wt Readings from Last 3 Encounters:  08/18/20 170 lb (77.1 kg)  07/06/20 191 lb 12.8 oz (87 kg)  07/03/20 169 lb (76.7 kg)     Kidney Function Lab Results  Component Value Date/Time   CREATININE 4.00 (H)  05/02/2020 10:34 AM   CREATININE 4.06 (H) 04/05/2020 09:35 AM   GFRNONAA 14 (L) 05/02/2020 10:34 AM   GFRAA 16 (L) 05/02/2020 10:34 AM    BMP Latest Ref Rng & Units 05/24/2020 05/02/2020 04/05/2020  Glucose 70 - 99 mg/dL - 155(H) 112(H)  BUN 8 - 23 mg/dL - 48(H) 37(H)  Creatinine 0.61 - 1.24 mg/dL - 4.00(H) 4.06(H)  BUN/Creat Ratio 10 - 24 - - 9(L)  Sodium 135 - 145 mmol/L 145 138 143  Potassium 3.5 - 5.1 mmol/L 4.9 5.4(H) 4.9  Chloride 98 - 111 mmol/L - 113(H) 109(H)  CO2 22 - 32 mmol/L - 19(L) 17(L)  Calcium 8.9 - 10.3 mg/dL - 8.0(L) 8.1(L)    . Current antihypertensive regimen:  o Amlodipine 5 mg 1 tablet daily o Metoprolol tartate 25 mg 1/2 tablet twice daily o Terazosin 1 mg   . How often are you checking your Blood Pressure? infrequently- States he has "a lot going on" and has not checked lately.  [name]  Current home BP readings: No recent reading.   What recent interventions/DTPs have been made by any provider to improve Blood Pressure control since last CPP Visit:   States he has been laying around in the chair and not been doing much.   . Any recent hospitalizations or ED visits since last visit with CPP? Yes   . What diet changes have been made to improve Blood Pressure Control?  o Sister has been making "healthy meals" but states he no longer has much of an appetite.  . What exercise is being done to improve your Blood Pressure Control?  o None. States he has been very sedentary.   Adherence Review: Is the patient currently on ACE/ARB medication? No Does the patient have >5 day gap between last estimated fill dates? No    Lipid Panel    Component Value Date/Time   CHOL 167 04/05/2020 0935   TRIG 283 (H) 04/05/2020 0935   HDL 44 04/05/2020 0935   LDLCALC 77 04/05/2020 0935    10-year ASCVD risk score: The 10-year ASCVD risk score Mikey Bussing DC Brooke Bonito., et al., 2013) is: 29.2%   Values used to calculate the score:     Age: 16 years     Sex: Male     Is Non-Hispanic  African American: No     Diabetic: Yes     Tobacco smoker: Yes     Systolic Blood Pressure: 846 mmHg     Is BP treated: Yes     HDL Cholesterol: 44 mg/dL     Total Cholesterol: 167 mg/dl  . Current antihyperlipidemic regimen:  o Atorvastatin 20 mg 1 tablet daily  . Previous antihyperlipidemic medications tried:   Atorvastatin 10 mg  Vascepa 1 gram   . ASCVD risk enhancing conditions: age >1, DM, HTN and CKD   . What recent interventions/DTPs have been made by any provider to improve Cholesterol control since last CPP Visit: Family states that patient has been very sedentary and does not have much of an appetite.  . Any recent hospitalizations or ED visits since last visit with CPP? Yes   . What diet changes have been made to improve Cholesterol?  o Family states that he has not had much of an appetite. His sister is making his meals and bringing them to him. She does "cook healthy".   . What exercise is being done to improve Cholesterol?  o None. Family States he has been very sedentary since possible cancer diagnosis.   Adherence Review: Does the patient have >5 day gap between last estimated fill dates? No     Follow-Up:  Pharmacist Review   Elly Modena. Owens Shark, [place] notified.  Margaretmary Dys, [place] Pharmacy Assistant [phone]

## 2020-09-06 ENCOUNTER — Other Ambulatory Visit: Payer: Self-pay

## 2020-09-06 ENCOUNTER — Ambulatory Visit (INDEPENDENT_AMBULATORY_CARE_PROVIDER_SITE_OTHER): Payer: Medicare Other | Admitting: Legal Medicine

## 2020-09-06 ENCOUNTER — Encounter: Payer: Self-pay | Admitting: Legal Medicine

## 2020-09-06 VITALS — BP 104/40 | HR 100 | Temp 97.4°F | Resp 18 | Ht 64.0 in | Wt 163.0 lb

## 2020-09-06 DIAGNOSIS — I2510 Atherosclerotic heart disease of native coronary artery without angina pectoris: Secondary | ICD-10-CM | POA: Diagnosis not present

## 2020-09-06 DIAGNOSIS — D631 Anemia in chronic kidney disease: Secondary | ICD-10-CM | POA: Diagnosis not present

## 2020-09-06 DIAGNOSIS — J984 Other disorders of lung: Secondary | ICD-10-CM

## 2020-09-06 DIAGNOSIS — C801 Malignant (primary) neoplasm, unspecified: Secondary | ICD-10-CM | POA: Diagnosis not present

## 2020-09-06 DIAGNOSIS — I1310 Hypertensive heart and chronic kidney disease without heart failure, with stage 1 through stage 4 chronic kidney disease, or unspecified chronic kidney disease: Secondary | ICD-10-CM

## 2020-09-06 DIAGNOSIS — N186 End stage renal disease: Secondary | ICD-10-CM | POA: Diagnosis not present

## 2020-09-06 DIAGNOSIS — J441 Chronic obstructive pulmonary disease with (acute) exacerbation: Secondary | ICD-10-CM | POA: Diagnosis not present

## 2020-09-06 DIAGNOSIS — N400 Enlarged prostate without lower urinary tract symptoms: Secondary | ICD-10-CM | POA: Diagnosis not present

## 2020-09-06 DIAGNOSIS — T8610 Unspecified complication of kidney transplant: Secondary | ICD-10-CM | POA: Diagnosis not present

## 2020-09-06 DIAGNOSIS — K8020 Calculus of gallbladder without cholecystitis without obstruction: Secondary | ICD-10-CM | POA: Diagnosis not present

## 2020-09-06 DIAGNOSIS — N2581 Secondary hyperparathyroidism of renal origin: Secondary | ICD-10-CM | POA: Diagnosis not present

## 2020-09-06 DIAGNOSIS — E1122 Type 2 diabetes mellitus with diabetic chronic kidney disease: Secondary | ICD-10-CM

## 2020-09-06 DIAGNOSIS — Z992 Dependence on renal dialysis: Secondary | ICD-10-CM | POA: Diagnosis not present

## 2020-09-06 DIAGNOSIS — S2241XA Multiple fractures of ribs, right side, initial encounter for closed fracture: Secondary | ICD-10-CM | POA: Diagnosis not present

## 2020-09-06 DIAGNOSIS — R918 Other nonspecific abnormal finding of lung field: Secondary | ICD-10-CM | POA: Diagnosis not present

## 2020-09-06 DIAGNOSIS — I120 Hypertensive chronic kidney disease with stage 5 chronic kidney disease or end stage renal disease: Secondary | ICD-10-CM | POA: Diagnosis not present

## 2020-09-06 DIAGNOSIS — Z940 Kidney transplant status: Secondary | ICD-10-CM

## 2020-09-06 DIAGNOSIS — D509 Iron deficiency anemia, unspecified: Secondary | ICD-10-CM | POA: Diagnosis not present

## 2020-09-06 DIAGNOSIS — E876 Hypokalemia: Secondary | ICD-10-CM | POA: Diagnosis not present

## 2020-09-06 NOTE — Progress Notes (Signed)
Subjective:  Patient ID: Victor Schultz, male    DOB: 1951-05-17  Age: 69 y.o. MRN: [id number]  Chief Complaint  Patient presents with  . Discuss medication    HPI: chronic visit  Patient has a lung mass and is to get a PET scan tomorrow for malignancy at [facility] hospital.  He has stopped insulin, azathiprim, prednedisone qod, stopped hytrin, spiriva.  His BS are doing well and he was getting hypoglycemia with insulin. He is on dialysis 3 times a week.  Last A1c on hospital was 6.0.  Patient is on chronic dialysis with stage V kidney disease he is status post a kidney transplant which has failed him also.  He has coronary artery disease with history of a bypass and COPD.  Patient is not certain what he will do if the PET scan is positive for malignancy.    Current Outpatient Medications on File Prior to Visit  Medication Sig Dispense Refill  . amLODipine (NORVASC) 5 MG tablet Take 5 mg by mouth daily.    [name] atorvastatin (LIPITOR) 20 MG tablet Take 20 mg by mouth daily.    [name] azaTHIOprine (IMURAN) 50 MG tablet Take by mouth.    . Azathioprine 75 MG TABS Take by mouth.    . B-D UF III MINI PEN NEEDLES 31G X 5 MM MISC USE WITH LANTUS AS DIRECTED    . calcitRIOL (ROCALTROL) 0.25 MCG capsule Take 0.25 mcg by mouth daily.    . ferrous sulfate 325 (65 FE) MG tablet Take by mouth.    [name] HYDROcodone-acetaminophen (NORCO/VICODIN) 5-325 MG tablet Take by mouth.    . levothyroxine (SYNTHROID) 50 MCG tablet Take 1 tablet (50 mcg total) by mouth daily. 90 tablet 0  . metoprolol tartrate (LOPRESSOR) 25 MG tablet Take 0.5 tablets (12.5 mg total) by mouth 2 (two) times daily. (Patient taking differently: Take 25 mg by mouth 2 (two) times daily. ) 90 tablet 0  . pantoprazole (PROTONIX) 40 MG tablet Take 1 tablet (40 mg total) by mouth 2 (two) times daily. (Patient taking differently: Take 40 mg by mouth daily. ) 60 tablet 2  . sevelamer (RENAGEL) 800 MG tablet Take by mouth.    . sucroferric oxyhydroxide  (VELPHORO) 500 MG chewable tablet Chew by mouth.    . tamsulosin (FLOMAX) 0.4 MG CAPS capsule Take 0.4 mg by mouth daily.    [name] terazosin (HYTRIN) 1 MG capsule     . traZODone (DESYREL) 50 MG tablet     . Vitamin D, Ergocalciferol, (DRISDOL) 1.25 MG (50000 UNIT) CAPS capsule Take 50,000 Units by mouth every Sunday.     [name] albuterol (VENTOLIN HFA) 108 (90 Base) MCG/ACT inhaler Inhale into the lungs. (Patient not taking: Reported on 09/06/2020)    . insulin glargine (LANTUS SOLOSTAR) 100 UNIT/ML Solostar Pen Inject 30 Units into the skin daily. (Patient not taking: Reported on 09/06/2020)    . LEVEMIR FLEXTOUCH 100 UNIT/ML FlexPen Inject into the skin. (Patient not taking: Reported on 09/06/2020)    . LOKELMA 10 g PACK packet Take by mouth. (Patient not taking: Reported on 09/06/2020)    . sodium bicarbonate 650 MG tablet Take 1,300 mg by mouth 2 (two) times daily.  (Patient not taking: Reported on 09/06/2020)    . tiotropium (SPIRIVA HANDIHALER) 18 MCG inhalation capsule Place into inhaler and inhale. (Patient not taking: Reported on 09/06/2020)     No current facility-administered medications on file prior to visit.   Past Medical History:  Diagnosis Date  .  Abnormal hemoglobin (Hgb) ([place]) 12/08/2019  . Actinic keratosis 04/15/2020  . Acute upper GI bleeding 12/22/2019  . Anemia 04/15/2020  . Atherosclerotic heart disease of native coronary artery with unstable angina pectoris ([place])   . CAD in native artery 02/08/2016  . Cancer ([place])    skin cancer on both arms.  . Chronic hepatitis ([place]) 04/05/2020  . Chronic kidney disease, stage 4 (severe) ([place]) 01/02/2020  . 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 Contact dermatitis and other eczema due to other chemical products 04/15/2020  . Disorder of mineral metabolism, unspecified 06/18/2020  . Dizziness 05/08/2020  . End stage renal disease ([place]) 04/15/2020  . Epigastric abdominal tenderness without rebound tenderness 01/02/2020  . Essential hypertension  12/22/2019  . Gastroesophageal reflux disease without esophagitis 12/06/2019  . Hypertensive heart and chronic kidney disease without heart failure, with stage 5 chronic kidney disease, or end stage renal disease ([place])    ESR,kidney transplant  . Hypertensive heart and renal disease 01/02/2020  . Hypothyroidism   . Kidney transplant status, cadaveric 07/08/2017   Formatting of this note might be different from the original. In the 1970s and again in 1982  . Long term (current) use of insulin ([place])   . Metabolic acidosis 0/04/6225  . Mixed hyperlipidemia   . Obstructive uropathy 06/18/2020  . Personal history of malignant neoplasm of skin 10/09/2010   SQUAMOUS CELL CARCINOMA- R AND L FOREARM, LEFT EAR SQUAMOUS CELL CARCINOMA- R AND L FOREARM, LEFT EAR  Formatting of this note might be different from the original. SQUAMOUS CELL CARCINOMA- R AND L FOREARM, LEFT EAR  . Presence of aortocoronary bypass graft 02/08/2016   Formatting of this note might be different from the original. in Oct of 2013 Formatting of this note might be different from the original. Formatting of this note might be different from the original. in Oct of 2013 Oct of 2013  Formatting of this note might be different from the original. Oct of 2013 Formatting of this note might be different from the original. in Oct of 2013 Formatting of this n  . Pure hypercholesterolemia 06/15/2020  . S/P CABG x 3 02/08/2016   in Oct of 2013 Oct of 2013  Formatting of this note might be different from the original. Oct of 2013  . Serum potassium elevated 12/08/2019  . Short of breath on exertion 07/06/2020  . Squamous cell carcinoma of skin of ear and external auditory canal 11/16/2010   SCC on left helix treated with Mohs surgery SCC on left helix treated with Mohs surgery  Formatting of this note might be different from the original. Overview:  SCC on left helix treated with Mohs surgery  . Type 2 diabetes mellitus with chronic kidney disease, without  long-term current use of insulin ([place]) 12/06/2019  . Unspecified disorder of kidney and ureter 06/15/2020  . Vitamin D deficiency 02/08/2016   In Jan 2017, his vitamin D level was 11 In Jan 2017, his vitamin D level was 44  Formatting of this note might be different from the original. In Jan 2017, his vitamin D level was 11  . Weight gain 07/06/2020  . Wheezing 07/06/2020   Past Surgical History:  Procedure Laterality Date  . BIOPSY  05/24/2020   Procedure: BIOPSY;  Surgeon: [name], MD;  Location: [facility];  Service: Endoscopy;;  . CATARACT EXTRACTION    . CORONARY ARTERY BYPASS GRAFT    . ESOPHAGOGASTRODUODENOSCOPY (EGD) WITH PROPOFOL Left 12/23/2019   Procedure: ESOPHAGOGASTRODUODENOSCOPY (EGD)  WITH PROPOFOL;  Surgeon: [name], MD;  Location: [facility];  Service: Endoscopy;  Laterality: Left;  . ESOPHAGOGASTRODUODENOSCOPY (EGD) WITH PROPOFOL N/A 05/24/2020   Procedure: ESOPHAGOGASTRODUODENOSCOPY (EGD) WITH PROPOFOL;  Surgeon: [name], MD;  Location: [facility];  Service: Endoscopy;  Laterality: N/A;  . KIDNEY TRANSPLANT     1974, 1982  . TRANSURETHRAL INCISION OF PROSTATE N/A 05/09/2020   Procedure: TRANSURETHRAL INCISION OF THE PROSTATE (TUIP);  Surgeon: Irine Seal, MD;  Location: [facility];  Service: Urology;  Laterality: N/A;    Family History  Problem Relation Age of Onset  . Liver disease Mother   . Alzheimer's disease Father    Social History   Socioeconomic History  . Marital status: Divorced    Spouse name: Not on file  . Number of children: 1  . Years of education: Not on file  . Highest education level: Not on file  Occupational History  . Occupation: retired  Tobacco Use  . Smoking status: Former Smoker    Packs/day: 0.50    Years: 39.00    Pack years: 19.50    Types: Cigarettes    Quit date: 08/13/2020    Years since quitting: 0.0  . Smokeless tobacco: Never Used  Vaping Use  . Vaping Use: Never used  Substance and Sexual Activity  .  Alcohol use: Never  . Drug use: Never  . Sexual activity: Not Currently  Other Topics Concern  . Not on file  Social History Narrative  . Not on file   Social Determinants of Health   Financial Resource Strain:   . Difficulty of Paying Living Expenses: Not on file  Food Insecurity: No Food Insecurity  . Worried About Charity fundraiser in the Last Year: Never true  . Ran Out of Food in the Last Year: Never true  Transportation Needs: No Transportation Needs  . Lack of Transportation (Medical): No  . Lack of Transportation (Non-Medical): No  Physical Activity:   . Days of Exercise per Week: Not on file  . Minutes of Exercise per Session: Not on file  Stress:   . Feeling of Stress : Not on file  Social Connections: Socially Isolated  . Frequency of Communication with Friends and Family: More than three times a week  . Frequency of Social Gatherings with Friends and Family: Once a week  . Attends Religious Services: Never  . Active Member of Clubs or Organizations: No  . Attends Archivist Meetings: Never  . Marital Status: Divorced    Review of Systems  Constitutional: Negative for activity change, appetite change and fever.  HENT: Negative for congestion, rhinorrhea and sinus pain.   Eyes: Negative for visual disturbance.  Respiratory: Negative for cough and shortness of breath.   Cardiovascular: Negative for chest pain, palpitations and leg swelling.  Gastrointestinal: Negative.   Endocrine: Positive for polyuria.  Genitourinary: Negative for difficulty urinating and dysuria.  Musculoskeletal: Positive for arthralgias.  Neurological: Positive for weakness.  Psychiatric/Behavioral: Negative.      Objective:  BP (!) 104/40   Pulse 100   Temp (!) 97.4 F (36.3 C)   Resp 18   Ht _0  (1.626 m)   Wt 163 lb (73.9 kg)   SpO2 95%   BMI 27.98 kg/m   BP/Weight 09/06/2020 08/18/2020 01/31/622  Systolic BP 762 831 517  Diastolic BP 40 30 68  Wt. (Lbs) 163  170 191.8  BMI 27.98 28.73 29.16    Physical Exam Vitals reviewed.  Constitutional:      Appearance: Normal appearance.  HENT:     Right Ear: Tympanic membrane normal.     Left Ear: Tympanic membrane normal.     Mouth/Throat:     Mouth: Mucous membranes are moist.     Pharynx: Oropharynx is clear.  Eyes:     Extraocular Movements: Extraocular movements intact.     Conjunctiva/sclera: Conjunctivae normal.     Pupils: Pupils are equal, round, and reactive to light.  Cardiovascular:     Rate and Rhythm: Normal rate and regular rhythm.     Pulses: Normal pulses.     Heart sounds: Normal heart sounds.  Pulmonary:     Effort: Respiratory distress present.     Breath sounds: Normal breath sounds.  Abdominal:     General: Abdomen is flat. Bowel sounds are normal.     Palpations: Abdomen is soft.  Musculoskeletal:        General: Normal range of motion.     Cervical back: Normal range of motion and neck supple.  Skin:    Capillary Refill: Capillary refill takes less than 2 seconds.  Neurological:     Mental Status: He is alert and oriented to person, place, and time.       Lab Results  Component Value Date   WBC 7.4 05/02/2020   HGB 8.8 (L) 05/24/2020   HCT 26.0 (L) 05/24/2020   PLT 219 05/02/2020   GLUCOSE 155 (H) 05/02/2020   CHOL 167 04/05/2020   TRIG 283 (H) 04/05/2020   HDL 44 04/05/2020   LDLCALC 77 04/05/2020   ALT 17 04/05/2020   AST 23 04/05/2020   NA 145 05/24/2020   K 4.9 05/24/2020   CL 113 (H) 05/02/2020   CREATININE 4.00 (H) 05/02/2020   BUN 48 (H) 05/02/2020   CO2 19 (L) 05/02/2020   TSH 3.310 12/06/2019   INR 1.2 12/23/2019   HGBA1C 6.4 (H) 04/05/2020   MICROALBUR 150 12/06/2019      Assessment & Plan:   Diagnoses and all orders for this visit: Lung mass Patient has right upper lobe lung mass which would get a PET scan tomorrow is suggestive to be malignant.  We discussed treatment options at length with him today.  Hypertensive heart  and renal disease with renal failure, stage 1 through stage 4 or unspecified chronic kidney disease, without heart failure An individual hypertension care plan was established and reinforced today.  The patient's status was assessed using clinical findings on exam and labs or diagnostic tests. The patient's success at meeting treatment goals on disease specific evidence-based guidelines and found to be well controlled. SELF MANAGEMENT: The patient and I together assessed ways to personally work towards obtaining the recommended goals. RECOMMENDATIONS: avoid decongestants found in common cold remedies, decrease consumption of alcohol, perform routine monitoring of BP with home BP cuff, exercise, reduction of dietary salt, take medicines as prescribed, try not to miss doses and quit smoking.  Regular exercise and maintaining a healthy weight is needed.  Stress reduction may help. A CLINICAL SUMMARY including written plan identify barriers to care unique to individual due to social or financial issues.  We attempt to mutually creat solutions for individual and family understanding.  Restrictive lung disease Patient is known to have restrictive lung disease which makes him short of breath constantly.  He is on low-dose prednisone for this which in the past has increased his glucose levels.  He has very low exercise tolerance is at least  a goal 2-3  Type 2 diabetes mellitus with chronic kidney disease, without long-term current use of insulin, unspecified CKD stage ([place]) Patient's diabetes seems to be under well control and A1c done neck: Week ago was six-point we will continue my office diabetes medicines especially his insulin which was given him hypoglycemic episodes.  In light of his other medical problems including possible malignancy he will probably should be more palliative rather than aggressively treated. End stage renal disease ([place]) Patient has end-stage renal disease and gets dialysis 3 times a  week.  His sugars have come down with the dialysis and he stopped his insulin and other medicines include cholesterol which is appropriate for his disease progression.  Kidney transplant status, cadaveric Patient is status post cadaver kidney transplant which has failed him and he is now on dialysis.  Antirejection medicines have been stopped.          I spent 30 minutes dedicated to the care of this patient on the date of this encounter to include face-to-face time with the patient, as well as: d will get a PET scan tomorrow for malignancy.  He has been tapering off medicines and stopped insulin with sugars up to 140.  Note extensive discussion and adjustment of medicines above.  Follow-up: Return in about 2 months (around 11/07/2020).  An After Visit Summary was printed and given to the patient.  Reinaldo Meeker, MD [facility] [phone]

## 2020-09-07 ENCOUNTER — Encounter (HOSPITAL_COMMUNITY)
Admission: RE | Admit: 2020-09-07 | Discharge: 2020-09-07 | Disposition: A | Discharge status: Home / Self Care | Payer: Medicare Other | Source: Ambulatory Visit | Attending: Legal Medicine | Admitting: Legal Medicine

## 2020-09-07 DIAGNOSIS — R918 Other nonspecific abnormal finding of lung field: Secondary | ICD-10-CM | POA: Diagnosis not present

## 2020-09-07 DIAGNOSIS — D509 Iron deficiency anemia, unspecified: Secondary | ICD-10-CM | POA: Diagnosis not present

## 2020-09-07 DIAGNOSIS — N2581 Secondary hyperparathyroidism of renal origin: Secondary | ICD-10-CM | POA: Diagnosis not present

## 2020-09-07 DIAGNOSIS — E876 Hypokalemia: Secondary | ICD-10-CM | POA: Diagnosis not present

## 2020-09-07 DIAGNOSIS — K8020 Calculus of gallbladder without cholecystitis without obstruction: Secondary | ICD-10-CM | POA: Diagnosis not present

## 2020-09-07 DIAGNOSIS — C801 Malignant (primary) neoplasm, unspecified: Secondary | ICD-10-CM | POA: Diagnosis not present

## 2020-09-07 DIAGNOSIS — S2241XA Multiple fractures of ribs, right side, initial encounter for closed fracture: Secondary | ICD-10-CM | POA: Diagnosis not present

## 2020-09-07 DIAGNOSIS — I2510 Atherosclerotic heart disease of native coronary artery without angina pectoris: Secondary | ICD-10-CM | POA: Diagnosis not present

## 2020-09-07 DIAGNOSIS — N186 End stage renal disease: Secondary | ICD-10-CM | POA: Diagnosis not present

## 2020-09-07 DIAGNOSIS — Z992 Dependence on renal dialysis: Secondary | ICD-10-CM | POA: Diagnosis not present

## 2020-09-07 LAB — GLUCOSE, CAPILLARY: Glucose-Capillary: 98 mg/dL (ref 70–99)

## 2020-09-07 MED ORDER — FLUDEOXYGLUCOSE F - 18 (FDG) INJECTION
8.0800 | Freq: Once | INTRAVENOUS | Status: AC | PRN
  Administered 2020-09-07: 8.08 via INTRAVENOUS

## 2020-09-08 DIAGNOSIS — R911 Solitary pulmonary nodule: Secondary | ICD-10-CM | POA: Diagnosis not present

## 2020-09-08 DIAGNOSIS — J4520 Mild intermittent asthma, uncomplicated: Secondary | ICD-10-CM | POA: Diagnosis not present

## 2020-09-09 DIAGNOSIS — E876 Hypokalemia: Secondary | ICD-10-CM | POA: Diagnosis not present

## 2020-09-09 DIAGNOSIS — D509 Iron deficiency anemia, unspecified: Secondary | ICD-10-CM | POA: Diagnosis not present

## 2020-09-09 DIAGNOSIS — N186 End stage renal disease: Secondary | ICD-10-CM | POA: Diagnosis not present

## 2020-09-09 DIAGNOSIS — N2581 Secondary hyperparathyroidism of renal origin: Secondary | ICD-10-CM | POA: Diagnosis not present

## 2020-09-09 DIAGNOSIS — Z992 Dependence on renal dialysis: Secondary | ICD-10-CM | POA: Diagnosis not present

## 2020-09-11 ENCOUNTER — Ambulatory Visit: Payer: Medicare Other

## 2020-09-11 ENCOUNTER — Other Ambulatory Visit: Payer: Self-pay

## 2020-09-11 DIAGNOSIS — N186 End stage renal disease: Secondary | ICD-10-CM

## 2020-09-11 NOTE — Patient Instructions (Signed)
Visit Information  Goals Addressed   None     The patient verbalized understanding of instructions, educational materials, and care plan provided today and declined offer to receive copy of patient instructions, educational materials, and care plan.   CCM enrollment status changed to "previously enrolled" as per patient request on 09/11/2020 to discontinue enrollment. Case closed to case management services in primary care home.   Burnice Logan, [facility]

## 2020-09-11 NOTE — Chronic Care Management (AMB) (Signed)
Spoke with patient's sister concerning medication questions. Patient's sister is working to help patient right now. She had questions regarding packaging and delivery options. We discussed that mail order is patient's cheapest option to receive medications.   Patient is now receiving regular dialysis treatment which makes him ineligible for continued CCM services at this time.   Pharmacist will be happy to coordinate medication delivery/packaging if needed.   Sherre Poot, PharmD, [facility] Clinical Pharmacist [facility] [phone] (office) [phone] (mobile)

## 2020-09-12 DIAGNOSIS — E876 Hypokalemia: Secondary | ICD-10-CM | POA: Diagnosis not present

## 2020-09-12 DIAGNOSIS — Z992 Dependence on renal dialysis: Secondary | ICD-10-CM | POA: Diagnosis not present

## 2020-09-12 DIAGNOSIS — N186 End stage renal disease: Secondary | ICD-10-CM | POA: Diagnosis not present

## 2020-09-12 DIAGNOSIS — E1122 Type 2 diabetes mellitus with diabetic chronic kidney disease: Secondary | ICD-10-CM | POA: Diagnosis not present

## 2020-09-12 DIAGNOSIS — N400 Enlarged prostate without lower urinary tract symptoms: Secondary | ICD-10-CM | POA: Diagnosis not present

## 2020-09-12 DIAGNOSIS — J441 Chronic obstructive pulmonary disease with (acute) exacerbation: Secondary | ICD-10-CM | POA: Diagnosis not present

## 2020-09-12 DIAGNOSIS — D631 Anemia in chronic kidney disease: Secondary | ICD-10-CM | POA: Diagnosis not present

## 2020-09-12 DIAGNOSIS — D509 Iron deficiency anemia, unspecified: Secondary | ICD-10-CM | POA: Diagnosis not present

## 2020-09-12 DIAGNOSIS — N2581 Secondary hyperparathyroidism of renal origin: Secondary | ICD-10-CM | POA: Diagnosis not present

## 2020-09-12 DIAGNOSIS — I120 Hypertensive chronic kidney disease with stage 5 chronic kidney disease or end stage renal disease: Secondary | ICD-10-CM | POA: Diagnosis not present

## 2020-09-13 DIAGNOSIS — I120 Hypertensive chronic kidney disease with stage 5 chronic kidney disease or end stage renal disease: Secondary | ICD-10-CM | POA: Diagnosis not present

## 2020-09-13 DIAGNOSIS — E1122 Type 2 diabetes mellitus with diabetic chronic kidney disease: Secondary | ICD-10-CM | POA: Diagnosis not present

## 2020-09-13 DIAGNOSIS — N400 Enlarged prostate without lower urinary tract symptoms: Secondary | ICD-10-CM | POA: Diagnosis not present

## 2020-09-13 DIAGNOSIS — J441 Chronic obstructive pulmonary disease with (acute) exacerbation: Secondary | ICD-10-CM | POA: Diagnosis not present

## 2020-09-13 DIAGNOSIS — N186 End stage renal disease: Secondary | ICD-10-CM | POA: Diagnosis not present

## 2020-09-13 DIAGNOSIS — D631 Anemia in chronic kidney disease: Secondary | ICD-10-CM | POA: Diagnosis not present

## 2020-09-14 DIAGNOSIS — D509 Iron deficiency anemia, unspecified: Secondary | ICD-10-CM | POA: Diagnosis not present

## 2020-09-14 DIAGNOSIS — E876 Hypokalemia: Secondary | ICD-10-CM | POA: Diagnosis not present

## 2020-09-14 DIAGNOSIS — N2581 Secondary hyperparathyroidism of renal origin: Secondary | ICD-10-CM | POA: Diagnosis not present

## 2020-09-14 DIAGNOSIS — N186 End stage renal disease: Secondary | ICD-10-CM | POA: Diagnosis not present

## 2020-09-14 DIAGNOSIS — Z992 Dependence on renal dialysis: Secondary | ICD-10-CM | POA: Diagnosis not present

## 2020-09-15 DIAGNOSIS — N400 Enlarged prostate without lower urinary tract symptoms: Secondary | ICD-10-CM | POA: Diagnosis not present

## 2020-09-15 DIAGNOSIS — I120 Hypertensive chronic kidney disease with stage 5 chronic kidney disease or end stage renal disease: Secondary | ICD-10-CM | POA: Diagnosis not present

## 2020-09-15 DIAGNOSIS — J441 Chronic obstructive pulmonary disease with (acute) exacerbation: Secondary | ICD-10-CM | POA: Diagnosis not present

## 2020-09-15 DIAGNOSIS — J4520 Mild intermittent asthma, uncomplicated: Secondary | ICD-10-CM | POA: Diagnosis not present

## 2020-09-15 DIAGNOSIS — D631 Anemia in chronic kidney disease: Secondary | ICD-10-CM | POA: Diagnosis not present

## 2020-09-15 DIAGNOSIS — E1122 Type 2 diabetes mellitus with diabetic chronic kidney disease: Secondary | ICD-10-CM | POA: Diagnosis not present

## 2020-09-15 DIAGNOSIS — N186 End stage renal disease: Secondary | ICD-10-CM | POA: Diagnosis not present

## 2020-09-15 DIAGNOSIS — R911 Solitary pulmonary nodule: Secondary | ICD-10-CM | POA: Diagnosis not present

## 2020-09-16 DIAGNOSIS — N2581 Secondary hyperparathyroidism of renal origin: Secondary | ICD-10-CM | POA: Diagnosis not present

## 2020-09-16 DIAGNOSIS — D509 Iron deficiency anemia, unspecified: Secondary | ICD-10-CM | POA: Diagnosis not present

## 2020-09-16 DIAGNOSIS — Z992 Dependence on renal dialysis: Secondary | ICD-10-CM | POA: Diagnosis not present

## 2020-09-16 DIAGNOSIS — N186 End stage renal disease: Secondary | ICD-10-CM | POA: Diagnosis not present

## 2020-09-16 DIAGNOSIS — E876 Hypokalemia: Secondary | ICD-10-CM | POA: Diagnosis not present

## 2020-09-17 DIAGNOSIS — N400 Enlarged prostate without lower urinary tract symptoms: Secondary | ICD-10-CM | POA: Diagnosis not present

## 2020-09-17 DIAGNOSIS — I120 Hypertensive chronic kidney disease with stage 5 chronic kidney disease or end stage renal disease: Secondary | ICD-10-CM | POA: Diagnosis not present

## 2020-09-17 DIAGNOSIS — E1122 Type 2 diabetes mellitus with diabetic chronic kidney disease: Secondary | ICD-10-CM | POA: Diagnosis not present

## 2020-09-17 DIAGNOSIS — D631 Anemia in chronic kidney disease: Secondary | ICD-10-CM | POA: Diagnosis not present

## 2020-09-17 DIAGNOSIS — J441 Chronic obstructive pulmonary disease with (acute) exacerbation: Secondary | ICD-10-CM | POA: Diagnosis not present

## 2020-09-17 DIAGNOSIS — N186 End stage renal disease: Secondary | ICD-10-CM | POA: Diagnosis not present

## 2020-09-18 ENCOUNTER — Other Ambulatory Visit: Payer: Self-pay

## 2020-09-18 DIAGNOSIS — K922 Gastrointestinal hemorrhage, unspecified: Secondary | ICD-10-CM

## 2020-09-18 DIAGNOSIS — D631 Anemia in chronic kidney disease: Secondary | ICD-10-CM | POA: Diagnosis not present

## 2020-09-18 DIAGNOSIS — N400 Enlarged prostate without lower urinary tract symptoms: Secondary | ICD-10-CM | POA: Diagnosis not present

## 2020-09-18 DIAGNOSIS — I120 Hypertensive chronic kidney disease with stage 5 chronic kidney disease or end stage renal disease: Secondary | ICD-10-CM | POA: Diagnosis not present

## 2020-09-18 DIAGNOSIS — E1122 Type 2 diabetes mellitus with diabetic chronic kidney disease: Secondary | ICD-10-CM | POA: Diagnosis not present

## 2020-09-18 DIAGNOSIS — N186 End stage renal disease: Secondary | ICD-10-CM | POA: Diagnosis not present

## 2020-09-18 DIAGNOSIS — J441 Chronic obstructive pulmonary disease with (acute) exacerbation: Secondary | ICD-10-CM | POA: Diagnosis not present

## 2020-09-19 DIAGNOSIS — Z992 Dependence on renal dialysis: Secondary | ICD-10-CM | POA: Diagnosis not present

## 2020-09-19 DIAGNOSIS — D509 Iron deficiency anemia, unspecified: Secondary | ICD-10-CM | POA: Diagnosis not present

## 2020-09-19 DIAGNOSIS — N2581 Secondary hyperparathyroidism of renal origin: Secondary | ICD-10-CM | POA: Diagnosis not present

## 2020-09-19 DIAGNOSIS — N186 End stage renal disease: Secondary | ICD-10-CM | POA: Diagnosis not present

## 2020-09-19 DIAGNOSIS — E876 Hypokalemia: Secondary | ICD-10-CM | POA: Diagnosis not present

## 2020-09-20 ENCOUNTER — Telehealth: Payer: Self-pay | Admitting: Oncology

## 2020-09-20 DIAGNOSIS — N186 End stage renal disease: Secondary | ICD-10-CM | POA: Diagnosis not present

## 2020-09-20 DIAGNOSIS — I120 Hypertensive chronic kidney disease with stage 5 chronic kidney disease or end stage renal disease: Secondary | ICD-10-CM | POA: Diagnosis not present

## 2020-09-20 DIAGNOSIS — J441 Chronic obstructive pulmonary disease with (acute) exacerbation: Secondary | ICD-10-CM | POA: Diagnosis not present

## 2020-09-20 DIAGNOSIS — E1122 Type 2 diabetes mellitus with diabetic chronic kidney disease: Secondary | ICD-10-CM | POA: Diagnosis not present

## 2020-09-20 DIAGNOSIS — D631 Anemia in chronic kidney disease: Secondary | ICD-10-CM | POA: Diagnosis not present

## 2020-09-20 DIAGNOSIS — N400 Enlarged prostate without lower urinary tract symptoms: Secondary | ICD-10-CM | POA: Diagnosis not present

## 2020-09-20 MED ORDER — TAMSULOSIN HCL 0.4 MG PO CAPS
0.4000 mg | ORAL_CAPSULE | Freq: Every day | ORAL | 1 refills | Status: DC
Start: 2020-09-20 — End: 2021-01-15

## 2020-09-20 MED ORDER — LEVOTHYROXINE SODIUM 50 MCG PO TABS
50.0000 ug | ORAL_TABLET | Freq: Every day | ORAL | 0 refills | Status: DC
Start: 2020-09-20 — End: 2020-12-27

## 2020-09-20 MED ORDER — PANTOPRAZOLE SODIUM 40 MG PO TBEC: 40.0000 mg | DELAYED_RELEASE_TABLET | Freq: Two times a day (BID) | ORAL | 2 refills | Status: DC

## 2020-09-20 MED ORDER — TERAZOSIN HCL 1 MG PO CAPS
1.0000 mg | ORAL_CAPSULE | Freq: Every day | ORAL | 0 refills | Status: DC
Start: 2020-09-20 — End: 2020-12-27

## 2020-09-20 MED ORDER — METOPROLOL TARTRATE 25 MG PO TABS: 12.5000 mg | ORAL_TABLET | Freq: Two times a day (BID) | ORAL | 0 refills | Status: DC

## 2020-09-20 MED ORDER — ATORVASTATIN CALCIUM 20 MG PO TABS
20.0000 mg | ORAL_TABLET | Freq: Every day | ORAL | 0 refills | Status: DC
Start: 2020-09-20 — End: 2020-12-27

## 2020-09-20 MED ORDER — AZATHIOPRINE 50 MG PO TABS
50.0000 mg | ORAL_TABLET | Freq: Two times a day (BID) | ORAL | 0 refills | Status: DC
Start: 2020-09-20 — End: 2020-10-16

## 2020-09-20 MED ORDER — AMLODIPINE BESYLATE 5 MG PO TABS
5.0000 mg | ORAL_TABLET | Freq: Every day | ORAL | 1 refills | Status: DC
Start: 2020-09-20 — End: 2020-12-20

## 2020-09-20 MED ORDER — SEVELAMER HCL 800 MG PO TABS
800.0000 mg | ORAL_TABLET | Freq: Two times a day (BID) | ORAL | 0 refills | Status: DC
Start: 2020-09-20 — End: 2021-01-15

## 2020-09-20 NOTE — Telephone Encounter (Signed)
Patient referred by Dr Gardiner Rhyme for Possible Lung Malignancy.  Appt made for 09/22/20 Labs 2:30 pm - Consult 3:00 pm.  Patient notified

## 2020-09-21 ENCOUNTER — Other Ambulatory Visit: Payer: Self-pay | Admitting: Oncology

## 2020-09-21 DIAGNOSIS — E876 Hypokalemia: Secondary | ICD-10-CM | POA: Diagnosis not present

## 2020-09-21 DIAGNOSIS — Z992 Dependence on renal dialysis: Secondary | ICD-10-CM | POA: Diagnosis not present

## 2020-09-21 DIAGNOSIS — D509 Iron deficiency anemia, unspecified: Secondary | ICD-10-CM | POA: Diagnosis not present

## 2020-09-21 DIAGNOSIS — R911 Solitary pulmonary nodule: Secondary | ICD-10-CM

## 2020-09-21 DIAGNOSIS — N2581 Secondary hyperparathyroidism of renal origin: Secondary | ICD-10-CM | POA: Diagnosis not present

## 2020-09-21 DIAGNOSIS — N186 End stage renal disease: Secondary | ICD-10-CM | POA: Diagnosis not present

## 2020-09-22 ENCOUNTER — Other Ambulatory Visit: Payer: Self-pay

## 2020-09-22 ENCOUNTER — Other Ambulatory Visit: Payer: Self-pay | Admitting: Hematology and Oncology

## 2020-09-22 ENCOUNTER — Encounter: Payer: Self-pay | Admitting: Oncology

## 2020-09-22 ENCOUNTER — Inpatient Hospital Stay (INDEPENDENT_AMBULATORY_CARE_PROVIDER_SITE_OTHER): Payer: Medicare Other | Admitting: Oncology

## 2020-09-22 ENCOUNTER — Inpatient Hospital Stay: Payer: Medicare Other | Attending: Oncology

## 2020-09-22 ENCOUNTER — Other Ambulatory Visit: Payer: Self-pay | Admitting: Oncology

## 2020-09-22 VITALS — BP 111/54 | HR 106 | Temp 98.1°F | Resp 20 | Ht 63.25 in | Wt 155.8 lb

## 2020-09-22 DIAGNOSIS — C801 Malignant (primary) neoplasm, unspecified: Secondary | ICD-10-CM | POA: Insufficient documentation

## 2020-09-22 DIAGNOSIS — D649 Anemia, unspecified: Secondary | ICD-10-CM | POA: Diagnosis not present

## 2020-09-22 DIAGNOSIS — I2510 Atherosclerotic heart disease of native coronary artery without angina pectoris: Secondary | ICD-10-CM | POA: Diagnosis not present

## 2020-09-22 DIAGNOSIS — Z0001 Encounter for general adult medical examination with abnormal findings: Secondary | ICD-10-CM | POA: Diagnosis not present

## 2020-09-22 DIAGNOSIS — E782 Mixed hyperlipidemia: Secondary | ICD-10-CM | POA: Diagnosis not present

## 2020-09-22 DIAGNOSIS — N186 End stage renal disease: Secondary | ICD-10-CM | POA: Diagnosis not present

## 2020-09-22 DIAGNOSIS — C3432 Malignant neoplasm of lower lobe, left bronchus or lung: Secondary | ICD-10-CM | POA: Diagnosis not present

## 2020-09-22 DIAGNOSIS — Z87891 Personal history of nicotine dependence: Secondary | ICD-10-CM | POA: Diagnosis not present

## 2020-09-22 DIAGNOSIS — R188 Other ascites: Secondary | ICD-10-CM | POA: Insufficient documentation

## 2020-09-22 DIAGNOSIS — C7802 Secondary malignant neoplasm of left lung: Secondary | ICD-10-CM | POA: Diagnosis not present

## 2020-09-22 DIAGNOSIS — E039 Hypothyroidism, unspecified: Secondary | ICD-10-CM | POA: Insufficient documentation

## 2020-09-22 DIAGNOSIS — K219 Gastro-esophageal reflux disease without esophagitis: Secondary | ICD-10-CM | POA: Diagnosis not present

## 2020-09-22 DIAGNOSIS — I6523 Occlusion and stenosis of bilateral carotid arteries: Secondary | ICD-10-CM | POA: Insufficient documentation

## 2020-09-22 DIAGNOSIS — I1311 Hypertensive heart and chronic kidney disease without heart failure, with stage 5 chronic kidney disease, or end stage renal disease: Secondary | ICD-10-CM | POA: Insufficient documentation

## 2020-09-22 DIAGNOSIS — E1122 Type 2 diabetes mellitus with diabetic chronic kidney disease: Secondary | ICD-10-CM | POA: Insufficient documentation

## 2020-09-22 DIAGNOSIS — Z79899 Other long term (current) drug therapy: Secondary | ICD-10-CM | POA: Diagnosis not present

## 2020-09-22 DIAGNOSIS — I700 Atherosclerosis of aorta: Secondary | ICD-10-CM | POA: Insufficient documentation

## 2020-09-22 DIAGNOSIS — B182 Chronic viral hepatitis C: Secondary | ICD-10-CM | POA: Diagnosis not present

## 2020-09-22 LAB — HEPATIC FUNCTION PANEL
ALT: 21 (ref 10–40)
AST: 48 — AB (ref 14–40)
Alkaline Phosphatase: 168 — AB (ref 25–125)
Bilirubin, Total: 0.7

## 2020-09-22 LAB — BASIC METABOLIC PANEL
BUN: 18 (ref 4–21)
CO2: 23 — AB (ref 13–22)
Chloride: 104 (ref 99–108)
Creatinine: 3.8 — AB (ref 0.6–1.3)
Glucose: 169
Potassium: 4.2 (ref 3.4–5.3)
Sodium: 141 (ref 137–147)

## 2020-09-22 LAB — CBC AND DIFFERENTIAL
HCT: 31 — AB (ref 41–53)
Hemoglobin: 10 — AB (ref 13.5–17.5)
Neutrophils Absolute: 3.08
Platelets: 191 (ref 150–399)
WBC: 5.4

## 2020-09-22 LAB — COMPREHENSIVE METABOLIC PANEL
Albumin: 3.3 — AB (ref 3.5–5.0)
Calcium: 8.1 — AB (ref 8.7–10.7)

## 2020-09-22 LAB — CBC: RBC: 3.1 — AB (ref 3.87–5.11)

## 2020-09-22 NOTE — Progress Notes (Signed)
[facility]  [address] [place],  [place]  [address] [phone]  Clinic Day:  09/22/2020  Referring physician: Lillard Anes,*   This document serves as a record of services personally performed by Hosie Poisson, MD. It was created on their behalf by [facility] E, a trained medical scribe. The creation of this record is based on the scribe's personal observations and the provider's statements to them.   CHIEF COMPLAINT:  CC: Probable lung malignancy  Current Treatment:  Surveillance   HISTORY OF PRESENT ILLNESS:  Victor Schultz is a 69 y.o. male referred by Dr. Gardiner Rhyme for the evaluation and treatment of a possible lung malignancy.  This was found when the patient had a fall in his home and presented to the [facility] on November 7th due to right shoulder and right chest wall pain.  Imaging revealed right 8, 9 and 10 rib fractures.  However, CT scan showed incidental finding of a 2.0 x 1.4 cm macrolobulated nodule in the anterior aspect of the left lower lobe abutting the left hemidiaphragm.  PET imaging was pursued on December 3rd confirming the lobulated left lower lobe pulmonary nodule measuring 1.8 x 1.7 cm to be hypermetabolic with an SUV max of 4.4, suspicious for possible malignancy.  There was also intense hypermetabolic activity associated with the superior aspect of the left ear associated with soft tissue thickening on the CT, which is suspicious for neoplasm.  No other evidence of malignancy was observed.  He is now referred to discuss treatment options.  INTERVAL HISTORY:  Victor Schultz is a pleasant 69 year old male with a complicated medical history including two kidney transplants.  He is currently on hemodialysis.  He states that the area of the left ear has been "worked on" multiple times.  Occasionally, if he brushes against the area, it will bleed.  He also has a large lesion of the right hand.  He got  tired of seeing a dermatologist regularly, but I advised that he see them again to take the skin cancers that have already developed.  He has cough, occasionally productive, but denies purulent sputum, hemoptysis or chest pan.  He does have shortness of breath.  His hemoglobin today is 10.0, and his white count and platelets are normal.  Chemistries are unremarkable except for a creatinine of 3.8, and a calcium of 8.1.  His  appetite is good, and he is eating well.  He denies any unintentional weight loss or gain.  He denies fever, chills or other signs of infection.  He denies nausea, vomiting, bowel issues, or abdominal pain.  He denies sore throat.  He is accompanied by his step mother today.  REVIEW OF SYSTEMS:  Review of Systems  Constitutional: Positive for fatigue (moderate to severe).  HENT:  Negative.   Eyes: Negative.   Respiratory: Positive for cough (occasionally productive) and shortness of breath. Negative for hemoptysis.   Cardiovascular: Negative.  Negative for chest pain.  Gastrointestinal: Negative.   Endocrine: Negative.   Genitourinary: Negative.    Musculoskeletal: Negative.   Skin: Negative.        Persistent lesion of the left ear and a lesion of the right hand.  Neurological: Negative.   Hematological: Negative.   Psychiatric/Behavioral: Negative.   All other systems reviewed and are negative.    VITALS:  Blood pressure (!) 111/54, pulse (!) 106, temperature 98.1 F (36.7 C), temperature source Oral, resp. rate 20, height 5' 3.25" (  1.607 m), weight 155 lb 12.8 oz (70.7 kg), SpO2 95 %.  Wt Readings from Last 3 Encounters:  09/22/20 155 lb 12.8 oz (70.7 kg)  09/06/20 163 lb (73.9 kg)  08/18/20 170 lb (77.1 kg)    Body mass index is 27.38 kg/m.  Performance status (ECOG): 1 - Symptomatic but completely ambulatory  PHYSICAL EXAM:  Physical Exam Constitutional:      General: He is not in acute distress.    Appearance: Normal appearance. He is normal weight.   HENT:     Head: Normocephalic and atraumatic.     Comments: He has a large skin cancer of the superior penna of the left ear. Eyes:     General: No scleral icterus.    Extraocular Movements: Extraocular movements intact.     Conjunctiva/sclera: Conjunctivae normal.     Pupils: Pupils are equal, round, and reactive to light.  Cardiovascular:     Rate and Rhythm: Regular rhythm. Tachycardia present.     Pulses: Normal pulses.     Heart sounds: Normal heart sounds. No murmur heard. No friction rub. No gallop.   Pulmonary:     Effort: Pulmonary effort is normal. No respiratory distress.     Breath sounds: Normal breath sounds.  Chest:     Comments: Central line in the right upper chest. Abdominal:     General: Bowel sounds are normal. There is no distension.     Palpations: Abdomen is soft. There is no hepatomegaly, splenomegaly or mass.     Tenderness: There is no abdominal tenderness.  Musculoskeletal:        General: Normal range of motion.     Cervical back: Normal range of motion and neck supple.     Right lower leg: No edema.     Left lower leg: No edema.     Comments: He has a nonfunctioning shunt of the left forearm   Lymphadenopathy:     Cervical: No cervical adenopathy.  Skin:    General: Skin is warm and dry.     Comments: He has widespread skin lesions consistent with keratoses.  Raised lesion of the dorsum of the right hand with a pearly edge measuring 3-4 cm in diameter.  Neurological:     General: No focal deficit present.     Mental Status: He is alert and oriented to person, place, and time. Mental status is at baseline.  Psychiatric:        Mood and Affect: Mood normal.        Behavior: Behavior normal.        Thought Content: Thought content normal.        Judgment: Judgment normal.    LABS:   CBC Latest Ref Rng & Units 09/22/2020 05/24/2020 05/02/2020  WBC - 5.4 - 7.4  Hemoglobin 13.5 - 17.5 10.0(A) 8.8(L) 9.5(L)  Hematocrit 41 - 53 31(A) 26.0(L)  29.9(L)  Platelets 150 - 399 191 - 219   CMP Latest Ref Rng & Units 09/22/2020 05/24/2020 05/02/2020  Glucose 70 - 99 mg/dL - - 155(H)  BUN 4 - 21 18 - 48(H)  Creatinine 0.6 - 1.3 3.8(A) - 4.00(H)  Sodium 137 - 147 141 145 138  Potassium 3.4 - 5.3 4.2 4.9 5.4(H)  Chloride 99 - 108 104 - 113(H)  CO2 13 - 22 23(A) - 19(L)  Calcium 8.7 - 10.7 8.1(A) - 8.0(L)  Total Protein 6.0 - 8.5 g/dL - - -  Total Bilirubin 0.0 - 1.2 mg/dL - - -  Alkaline Phos 25 - 125 168(A) - -  AST 14 - 40 48(A) - -  ALT 10 - 40 21 - -     No results found for: CEA1 / No results found for: CEA1   STUDIES:  NM PET Image Initial (PI) Skull Base To Thigh  Result Date: 09/08/2020 CLINICAL DATA:  Initial treatment strategy for left lower lobe pulmonary nodule on CT. Cancer of unknown primary, staging. EXAM: NUCLEAR MEDICINE PET SKULL BASE TO THIGH TECHNIQUE: 8.08 mCi F-18 FDG was injected intravenously. Full-ring PET imaging was performed from the skull base to thigh after the radiotracer. CT data was obtained and used for attenuation correction and anatomic localization. Fasting blood glucose: 98 mg/dl COMPARISON:  CTs of the chest, abdomen and pelvis 08/13/2020 and 07/08/2020 FINDINGS: Mediastinal blood pool activity: SUV max 2.8 NECK: No hypermetabolic cervical lymph nodes are identified.There are no lesions of the pharyngeal mucosal space. There is marked focal hypermetabolic activity associated with the superior aspect the left ear (SUV max 10.4). There is soft tissue thickening in this area on the CT images suspicious for an infiltrating mass. Correlate clinically. Incidental CT findings: Bilateral carotid atherosclerosis. CHEST: There are no hypermetabolic mediastinal, hilar or axillary lymph nodes. The lobulated nodule of concern along the diaphragmatic surface of the left lower lobe measures 1.8 x 1.7 cm on image 39/8 and is hypermetabolic with an SUV max of 4.4. No other hypermetabolic or suspicious pulmonary nodules  are identified. Incidental CT findings: Right IJ hemodialysis catheters extend into the right atrium. There is associated hypermetabolic activity at the tip of the catheter attributed to the injection. There is diffuse atherosclerosis of the aorta, great vessels and coronary arteries status post median sternotomy and CABG. Small right-greater-than-left pleural effusions are present without associated hypermetabolic activity. Grossly stable chronic interstitial lung disease with scattered subpleural reticulation. ABDOMEN/PELVIS: There is no hypermetabolic activity within the liver, adrenal glands, spleen or pancreas. There is no hypermetabolic nodal activity. Focal hypermetabolic activity within the right lower quadrant is attributed to collecting system activity within the right renal transplant. Incidental CT findings: Changes of cirrhosis and multiple calcified gallstones are again noted. A small amount of ascites has improved. The native kidneys are severely atrophied with diffuse cortical thinning. Transplant kidney in the right lower quadrant is unchanged in appearance. Dense calcification in the left iliac fossa is attributed to an old left renal transplant. Left scrotal hydrocele and diffuse vascular calcifications are noted. SKELETON: There is no hypermetabolic activity to suggest osseous metastatic disease. There is focal hypermetabolic activity associated with the recently demonstrated fractures of the right 8 through 10th ribs. There is arthropathic activity in the shoulders, right greater than left. Incidental CT findings: none IMPRESSION: 1. The lobulated left lower lobe pulmonary nodule is hypermetabolic, suspicious for possible malignancy (primary or metastatic). 2. Intense hypermetabolic activity associated with the superior aspect of the left ear associated with soft tissue thickening on the CT, suspicious for neoplasm. Correlate clinically. Inflammation and urine contamination are considered less  likely. 3. No other evidence of malignancy. 4. Multiple incidental findings as detailed above, not significantly changed from recent diagnostic CTs. Electronically Signed   By: Richardean Sale M.D.   On: 09/08/2020 08:30    He underwent a chest x-ray 2 view on 07/03/2020 showing bibasilar scarring, stable.  NO new opacity evident.  Stable cardiac silhouette.  Postoperative changes noted.  He underwent CT chest, abdomen and pelvis without contrast on 08/13/2020 showing: 1. Nondisplaced fractures of the lateral  right eighth, ninth and tenth ribs. No pneumothorax. 2. No other evidence of significant acute traumatic injury elsewhere in the chest, abdomen or pelvis. 3. 2.0 x 1.4 cm macrolobulated nodule in the anterior aspect of the left lower lobe abutting the left hemidiaphragm. This is concerning for potential primary bronchogenic neoplasm. Further evaluation with nonemergent PET-CT is suggested in the near future to better evaluate this finding. 4. The appearance of the lungs is concerning for interstitial lung disease, (likely to represent usual interstitial pneumonia (UIP). 5. Small right and trace left pleural effusions lying dependently. 6. Small volume of ascites. 7. Aortic atherosclerosis, in addition to left main and 3 vessel coronary artery disease. Status post median sternotomy for CABG including LIMA to the LAD. 8. Cholelithiasis. 9. Additional incidental findings, as above.   HISTORY:   Past Medical History:  Diagnosis Date  . Abnormal hemoglobin (Hgb) ([place]) 12/08/2019  . Actinic keratosis 04/15/2020  . Acute upper GI bleeding 12/22/2019  . Anemia 04/15/2020  . Atherosclerotic heart disease of native coronary artery with unstable angina pectoris ([place])   . CAD in native artery 02/08/2016  . Cancer ([place])    skin cancer on both arms.  . Chronic hepatitis ([place]) 04/05/2020  . Chronic kidney disease, stage 4 (severe) ([place]) 01/02/2020  . 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 Contact  dermatitis and other eczema due to other chemical products 04/15/2020  . Disorder of mineral metabolism, unspecified 06/18/2020  . Dizziness 05/08/2020  . End stage renal disease ([place]) 04/15/2020  . Epigastric abdominal tenderness without rebound tenderness 01/02/2020  . Essential hypertension 12/22/2019  . Gastroesophageal reflux disease without esophagitis 12/06/2019  . Hypertensive heart and chronic kidney disease without heart failure, with stage 5 chronic kidney disease, or end stage renal disease ([place])    ESR,kidney transplant  . Hypertensive heart and renal disease 01/02/2020  . Hypothyroidism   . Kidney transplant status, cadaveric 07/08/2017   Formatting of this note might be different from the original. In the 1970s and again in 1982  . Long term (current) use of insulin ([place])   . Metabolic acidosis 2/42/6834  . Mixed hyperlipidemia   . Obstructive uropathy 06/18/2020  . Personal history of malignant neoplasm of skin 10/09/2010   SQUAMOUS CELL CARCINOMA- R AND L FOREARM, LEFT EAR SQUAMOUS CELL CARCINOMA- R AND L FOREARM, LEFT EAR  Formatting of this note might be different from the original. SQUAMOUS CELL CARCINOMA- R AND L FOREARM, LEFT EAR  . Presence of aortocoronary bypass graft 02/08/2016   Formatting of this note might be different from the original. in Oct of 2013 Formatting of this note might be different from the original. Formatting of this note might be different from the original. in Oct of 2013 Oct of 2013  Formatting of this note might be different from the original. Oct of 2013 Formatting of this note might be different from the original. in Oct of 2013 Formatting of this n  . Pure hypercholesterolemia 06/15/2020  . S/P CABG x 3 02/08/2016   in Oct of 2013 Oct of 2013  Formatting of this note might be different from the original. Oct of 2013  . Serum potassium elevated 12/08/2019  . Short of breath on exertion 07/06/2020  . Squamous cell carcinoma of skin of ear and external auditory  canal 11/16/2010   SCC on left helix treated with Mohs surgery SCC on left helix treated with Mohs surgery  Formatting of this note might be different from the original. Overview:  SCC on left helix treated with Mohs surgery  . Type 2 diabetes mellitus with chronic kidney disease, without long-term current use of insulin ([place]) 12/06/2019  . Unspecified disorder of kidney and ureter 06/15/2020  . Vitamin D deficiency 02/08/2016   In Jan 2017, his vitamin D level was 11 In Jan 2017, his vitamin D level was 23  Formatting of this note might be different from the original. In Jan 2017, his vitamin D level was 11  . Weight gain 07/06/2020  . Wheezing 07/06/2020    Past Surgical History:  Procedure Laterality Date  . BIOPSY  05/24/2020   Procedure: BIOPSY;  Surgeon: [name], MD;  Location: [facility];  Service: Endoscopy;;  . CATARACT EXTRACTION    . CORONARY ARTERY BYPASS GRAFT    . ESOPHAGOGASTRODUODENOSCOPY (EGD) WITH PROPOFOL Left 12/23/2019   Procedure: ESOPHAGOGASTRODUODENOSCOPY (EGD) WITH PROPOFOL;  Surgeon: [name], MD;  Location: [facility];  Service: Endoscopy;  Laterality: Left;  . ESOPHAGOGASTRODUODENOSCOPY (EGD) WITH PROPOFOL N/A 05/24/2020   Procedure: ESOPHAGOGASTRODUODENOSCOPY (EGD) WITH PROPOFOL;  Surgeon: [name], MD;  Location: [facility];  Service: Endoscopy;  Laterality: N/A;  . KIDNEY TRANSPLANT     1974, 1982  . TRANSURETHRAL INCISION OF PROSTATE N/A 05/09/2020   Procedure: TRANSURETHRAL INCISION OF THE PROSTATE (TUIP);  Surgeon: Irine Seal, MD;  Location: [facility];  Service: Urology;  Laterality: N/A;    Family History  Problem Relation Age of Onset  . Liver disease Mother   . Alzheimer's disease Father     Social History:  reports that he quit smoking about 2 months ago. His smoking use included cigarettes. He has a 19.50 pack-year smoking history. He has never used smokeless tobacco. He reports that he does not drink alcohol and does not use  drugs.The patient is accompanied by his step mother today.  He is single and lives at home alone.  He has 1 child, but they are not actively present.  He is retired from [employer].  Allergies:  Allergies  Allergen Reactions  . Methoxy Polyethylene Glycol-Epoetin Beta Other (See Comments)    Current Medications: Current Outpatient Medications  Medication Sig Dispense Refill  . albuterol (VENTOLIN HFA) 108 (90 Base) MCG/ACT inhaler Inhale into the lungs every 4 (four) hours as needed.    [name] amLODipine (NORVASC) 5 MG tablet Take 1 tablet (5 mg total) by mouth daily. 90 tablet 1  . atorvastatin (LIPITOR) 20 MG tablet Take 1 tablet (20 mg total) by mouth daily. 90 tablet 0  . azaTHIOprine (IMURAN) 50 MG tablet Take 1 tablet (50 mg total) by mouth in the morning and at bedtime. 1 1/2 tablet in the morning and 1 tablet in the evening 270 tablet 0  . B-D UF III MINI PEN NEEDLES 31G X 5 MM MISC USE WITH LANTUS AS DIRECTED    . calcitRIOL (ROCALTROL) 0.25 MCG capsule Take 0.25 mcg by mouth daily.    . ferrous sulfate 325 (65 FE) MG tablet Take by mouth once a week.    . levothyroxine (SYNTHROID) 50 MCG tablet Take 1 tablet (50 mcg total) by mouth daily. 90 tablet 0  . metoprolol tartrate (LOPRESSOR) 25 MG tablet Take 0.5 tablets (12.5 mg total) by mouth 2 (two) times daily. 90 tablet 0  . pantoprazole (PROTONIX) 40 MG tablet Take 1 tablet (40 mg total) by mouth 2 (two) times daily. 60 tablet 2  . sevelamer (RENAGEL) 800 MG tablet Take 1 tablet (800 mg total) by mouth in the morning  and at bedtime. 180 tablet 0  . sodium bicarbonate 650 MG tablet Take 1,300 mg by mouth 2 (two) times daily.    . tamsulosin (FLOMAX) 0.4 MG CAPS capsule Take 1 capsule (0.4 mg total) by mouth daily. 90 capsule 1  . terazosin (HYTRIN) 1 MG capsule Take 1 capsule (1 mg total) by mouth daily. 90 capsule 0  . Vitamin D, Ergocalciferol, (DRISDOL) 1.25 MG (50000 UNIT) CAPS capsule Take 50,000 Units by mouth every Sunday.     .  insulin glargine (LANTUS SOLOSTAR) 100 UNIT/ML Solostar Pen Inject 30 Units into the skin daily. (Patient not taking: No sig reported)    . LEVEMIR FLEXTOUCH 100 UNIT/ML FlexPen Inject into the skin. (Patient not taking: No sig reported)    . LOKELMA 10 g PACK packet Take by mouth. (Patient not taking: No sig reported)    . prednisoLONE 5 MG TABS tablet Take 5 mg by mouth every other day.    . tiotropium (SPIRIVA HANDIHALER) 18 MCG inhalation capsule Place into inhaler and inhale. (Patient not taking: No sig reported)     No current facility-administered medications for this visit.     ASSESSMENT & PLAN:   Assessment:   1.  Lobulated left lower lobe pulmonary nodule measuring 1.8 x 1.7 cm.  This is concerning for malignancy and has been confirmed hypermerabolic on recent PET imaging.  He has not had a biopsy yet, and he is not sure how aggressive he wishes to be.  This appears to be a stage I lung cancer and so we discussed options of treatment versus follow up.  2.  Intense hypermetabolic activity associated with the superior aspect of the left ear associated with soft tissue thickening on the CT.  I am sure this is recurrent skin cancer which is steadily growing.  He has seen dermatology and has had multiple procedures, and I advised that he see them again.  3.  Large lesion of the right hand which looks like a very advanced basal cell carcinoma.  I also advised him to follow up with dermatology to have this removed.  4.  End stage renal disease and history of kidney transplants, in the 70s and 80s.  He is currently on hemodialysis.  5.  Chronic hepatitis C, untreated.  6.  Anemia, he has been taking oral iron and receiving iron through dialysis with good response.   Plan: This is a pleasant 69 year old male with a complicated medical history including kidney transplants, heart disease, and lung disease.  He is currently on hemodialysis.  He was recently diagnosed with a left lower lobe  pulmonary nodule measuring 1.8 x 1.7 cm which is hypermetabolic on PET imaging.  Usually we would pursue pathologic confirmation, but with the PET results, we know this is likely malignancy.  Surgical resection, could be considered, but this would be a high risk and invasive procedure.  As he is on dialysis and this appears to be a stage I cancer, I do not recommend chemotherapy, but he may be a candidate for stereotactic radiation.  If he does not wish to be aggressive, we could pursue surveillance and continue to monitor the nodule with routine CT imaging if he chooses that, I would repeat his next CT in 3 months.  I drew a CEA today to see if it is elevated, and will call them with the results.  He will take some time to make a decision.  He and his step mother understand and agree  with this plan of care.  I have answered their questions, and they know to call with any concerns.  Thank you for the opportunity    I provided 50 minutes of face-to-face time during this this encounter and > 50% was spent counseling as documented under my assessment and plan.    Derwood Kaplan, MD [facility] [address] [place] [address] Dept: [phone] Dept Fax: [phone]   I, Rita Ohara, am acting as scribe for Derwood Kaplan, MD  I have reviewed this report as typed by the medical scribe, and it is complete and accurate.

## 2020-09-23 DIAGNOSIS — Z992 Dependence on renal dialysis: Secondary | ICD-10-CM | POA: Diagnosis not present

## 2020-09-23 DIAGNOSIS — E876 Hypokalemia: Secondary | ICD-10-CM | POA: Diagnosis not present

## 2020-09-23 DIAGNOSIS — D509 Iron deficiency anemia, unspecified: Secondary | ICD-10-CM | POA: Diagnosis not present

## 2020-09-23 DIAGNOSIS — N2581 Secondary hyperparathyroidism of renal origin: Secondary | ICD-10-CM | POA: Diagnosis not present

## 2020-09-23 DIAGNOSIS — N186 End stage renal disease: Secondary | ICD-10-CM | POA: Diagnosis not present

## 2020-09-24 LAB — CEA: CEA: 4.6 ng/mL (ref 0.0–4.7)

## 2020-09-26 DIAGNOSIS — D509 Iron deficiency anemia, unspecified: Secondary | ICD-10-CM | POA: Diagnosis not present

## 2020-09-26 DIAGNOSIS — N2581 Secondary hyperparathyroidism of renal origin: Secondary | ICD-10-CM | POA: Diagnosis not present

## 2020-09-26 DIAGNOSIS — Z992 Dependence on renal dialysis: Secondary | ICD-10-CM | POA: Diagnosis not present

## 2020-09-26 DIAGNOSIS — E876 Hypokalemia: Secondary | ICD-10-CM | POA: Diagnosis not present

## 2020-09-26 DIAGNOSIS — N186 End stage renal disease: Secondary | ICD-10-CM | POA: Diagnosis not present

## 2020-09-27 DIAGNOSIS — N400 Enlarged prostate without lower urinary tract symptoms: Secondary | ICD-10-CM | POA: Diagnosis not present

## 2020-09-27 DIAGNOSIS — D631 Anemia in chronic kidney disease: Secondary | ICD-10-CM | POA: Diagnosis not present

## 2020-09-27 DIAGNOSIS — N186 End stage renal disease: Secondary | ICD-10-CM | POA: Diagnosis not present

## 2020-09-27 DIAGNOSIS — I120 Hypertensive chronic kidney disease with stage 5 chronic kidney disease or end stage renal disease: Secondary | ICD-10-CM | POA: Diagnosis not present

## 2020-09-27 DIAGNOSIS — E1122 Type 2 diabetes mellitus with diabetic chronic kidney disease: Secondary | ICD-10-CM | POA: Diagnosis not present

## 2020-09-27 DIAGNOSIS — J441 Chronic obstructive pulmonary disease with (acute) exacerbation: Secondary | ICD-10-CM | POA: Diagnosis not present

## 2020-09-27 DIAGNOSIS — L814 Other melanin hyperpigmentation: Secondary | ICD-10-CM | POA: Diagnosis not present

## 2020-09-27 DIAGNOSIS — L578 Other skin changes due to chronic exposure to nonionizing radiation: Secondary | ICD-10-CM | POA: Diagnosis not present

## 2020-09-27 DIAGNOSIS — C44622 Squamous cell carcinoma of skin of right upper limb, including shoulder: Secondary | ICD-10-CM | POA: Diagnosis not present

## 2020-09-27 DIAGNOSIS — R911 Solitary pulmonary nodule: Secondary | ICD-10-CM | POA: Diagnosis not present

## 2020-09-27 DIAGNOSIS — J4520 Mild intermittent asthma, uncomplicated: Secondary | ICD-10-CM | POA: Diagnosis not present

## 2020-09-27 DIAGNOSIS — C44229 Squamous cell carcinoma of skin of left ear and external auricular canal: Secondary | ICD-10-CM | POA: Diagnosis not present

## 2020-09-27 DIAGNOSIS — Z23 Encounter for immunization: Secondary | ICD-10-CM | POA: Diagnosis not present

## 2020-09-28 DIAGNOSIS — E876 Hypokalemia: Secondary | ICD-10-CM | POA: Diagnosis not present

## 2020-09-28 DIAGNOSIS — N2581 Secondary hyperparathyroidism of renal origin: Secondary | ICD-10-CM | POA: Diagnosis not present

## 2020-09-28 DIAGNOSIS — N186 End stage renal disease: Secondary | ICD-10-CM | POA: Diagnosis not present

## 2020-09-28 DIAGNOSIS — Z992 Dependence on renal dialysis: Secondary | ICD-10-CM | POA: Diagnosis not present

## 2020-09-28 DIAGNOSIS — D509 Iron deficiency anemia, unspecified: Secondary | ICD-10-CM | POA: Diagnosis not present

## 2020-09-29 ENCOUNTER — Other Ambulatory Visit: Payer: Self-pay | Admitting: Oncology

## 2020-09-29 DIAGNOSIS — N400 Enlarged prostate without lower urinary tract symptoms: Secondary | ICD-10-CM | POA: Diagnosis not present

## 2020-09-29 DIAGNOSIS — D631 Anemia in chronic kidney disease: Secondary | ICD-10-CM | POA: Diagnosis not present

## 2020-09-29 DIAGNOSIS — E1122 Type 2 diabetes mellitus with diabetic chronic kidney disease: Secondary | ICD-10-CM | POA: Diagnosis not present

## 2020-09-29 DIAGNOSIS — N186 End stage renal disease: Secondary | ICD-10-CM | POA: Diagnosis not present

## 2020-09-29 DIAGNOSIS — I120 Hypertensive chronic kidney disease with stage 5 chronic kidney disease or end stage renal disease: Secondary | ICD-10-CM | POA: Diagnosis not present

## 2020-09-29 DIAGNOSIS — J441 Chronic obstructive pulmonary disease with (acute) exacerbation: Secondary | ICD-10-CM | POA: Diagnosis not present

## 2020-10-01 DIAGNOSIS — Z992 Dependence on renal dialysis: Secondary | ICD-10-CM | POA: Diagnosis not present

## 2020-10-01 DIAGNOSIS — N186 End stage renal disease: Secondary | ICD-10-CM | POA: Diagnosis not present

## 2020-10-01 DIAGNOSIS — E876 Hypokalemia: Secondary | ICD-10-CM | POA: Diagnosis not present

## 2020-10-01 DIAGNOSIS — D509 Iron deficiency anemia, unspecified: Secondary | ICD-10-CM | POA: Diagnosis not present

## 2020-10-01 DIAGNOSIS — N2581 Secondary hyperparathyroidism of renal origin: Secondary | ICD-10-CM | POA: Diagnosis not present

## 2020-10-03 ENCOUNTER — Other Ambulatory Visit: Payer: Self-pay

## 2020-10-03 DIAGNOSIS — D509 Iron deficiency anemia, unspecified: Secondary | ICD-10-CM | POA: Diagnosis not present

## 2020-10-03 DIAGNOSIS — K209 Esophagitis, unspecified without bleeding: Secondary | ICD-10-CM | POA: Insufficient documentation

## 2020-10-03 DIAGNOSIS — N184 Chronic kidney disease, stage 4 (severe): Secondary | ICD-10-CM

## 2020-10-03 DIAGNOSIS — N2581 Secondary hyperparathyroidism of renal origin: Secondary | ICD-10-CM | POA: Diagnosis not present

## 2020-10-03 DIAGNOSIS — N186 End stage renal disease: Secondary | ICD-10-CM | POA: Diagnosis not present

## 2020-10-03 DIAGNOSIS — E876 Hypokalemia: Secondary | ICD-10-CM | POA: Diagnosis not present

## 2020-10-03 DIAGNOSIS — Z992 Dependence on renal dialysis: Secondary | ICD-10-CM | POA: Diagnosis not present

## 2020-10-04 DIAGNOSIS — J441 Chronic obstructive pulmonary disease with (acute) exacerbation: Secondary | ICD-10-CM | POA: Diagnosis not present

## 2020-10-04 DIAGNOSIS — N186 End stage renal disease: Secondary | ICD-10-CM | POA: Diagnosis not present

## 2020-10-04 DIAGNOSIS — R911 Solitary pulmonary nodule: Secondary | ICD-10-CM | POA: Diagnosis not present

## 2020-10-04 DIAGNOSIS — R0600 Dyspnea, unspecified: Secondary | ICD-10-CM | POA: Diagnosis not present

## 2020-10-04 DIAGNOSIS — I120 Hypertensive chronic kidney disease with stage 5 chronic kidney disease or end stage renal disease: Secondary | ICD-10-CM | POA: Diagnosis not present

## 2020-10-04 DIAGNOSIS — N400 Enlarged prostate without lower urinary tract symptoms: Secondary | ICD-10-CM | POA: Diagnosis not present

## 2020-10-04 DIAGNOSIS — D631 Anemia in chronic kidney disease: Secondary | ICD-10-CM | POA: Diagnosis not present

## 2020-10-04 DIAGNOSIS — E1122 Type 2 diabetes mellitus with diabetic chronic kidney disease: Secondary | ICD-10-CM | POA: Diagnosis not present

## 2020-10-05 ENCOUNTER — Other Ambulatory Visit: Payer: Self-pay

## 2020-10-05 DIAGNOSIS — Z794 Long term (current) use of insulin: Secondary | ICD-10-CM | POA: Diagnosis not present

## 2020-10-05 DIAGNOSIS — Z950 Presence of cardiac pacemaker: Secondary | ICD-10-CM | POA: Diagnosis not present

## 2020-10-05 DIAGNOSIS — E039 Hypothyroidism, unspecified: Secondary | ICD-10-CM | POA: Diagnosis not present

## 2020-10-05 DIAGNOSIS — J441 Chronic obstructive pulmonary disease with (acute) exacerbation: Secondary | ICD-10-CM | POA: Diagnosis not present

## 2020-10-05 DIAGNOSIS — F17210 Nicotine dependence, cigarettes, uncomplicated: Secondary | ICD-10-CM | POA: Diagnosis not present

## 2020-10-05 DIAGNOSIS — E1165 Type 2 diabetes mellitus with hyperglycemia: Secondary | ICD-10-CM | POA: Diagnosis not present

## 2020-10-05 DIAGNOSIS — I2510 Atherosclerotic heart disease of native coronary artery without angina pectoris: Secondary | ICD-10-CM | POA: Diagnosis not present

## 2020-10-05 DIAGNOSIS — E1122 Type 2 diabetes mellitus with diabetic chronic kidney disease: Secondary | ICD-10-CM | POA: Diagnosis not present

## 2020-10-05 DIAGNOSIS — D509 Iron deficiency anemia, unspecified: Secondary | ICD-10-CM | POA: Diagnosis not present

## 2020-10-05 DIAGNOSIS — B182 Chronic viral hepatitis C: Secondary | ICD-10-CM | POA: Diagnosis not present

## 2020-10-05 DIAGNOSIS — Z951 Presence of aortocoronary bypass graft: Secondary | ICD-10-CM | POA: Diagnosis not present

## 2020-10-05 DIAGNOSIS — N186 End stage renal disease: Secondary | ICD-10-CM | POA: Diagnosis not present

## 2020-10-05 DIAGNOSIS — N179 Acute kidney failure, unspecified: Secondary | ICD-10-CM | POA: Diagnosis not present

## 2020-10-05 DIAGNOSIS — Z992 Dependence on renal dialysis: Secondary | ICD-10-CM | POA: Diagnosis not present

## 2020-10-05 DIAGNOSIS — N400 Enlarged prostate without lower urinary tract symptoms: Secondary | ICD-10-CM | POA: Diagnosis not present

## 2020-10-05 DIAGNOSIS — E785 Hyperlipidemia, unspecified: Secondary | ICD-10-CM | POA: Diagnosis not present

## 2020-10-05 DIAGNOSIS — D631 Anemia in chronic kidney disease: Secondary | ICD-10-CM | POA: Diagnosis not present

## 2020-10-05 DIAGNOSIS — I120 Hypertensive chronic kidney disease with stage 5 chronic kidney disease or end stage renal disease: Secondary | ICD-10-CM | POA: Diagnosis not present

## 2020-10-05 DIAGNOSIS — K219 Gastro-esophageal reflux disease without esophagitis: Secondary | ICD-10-CM | POA: Diagnosis not present

## 2020-10-05 DIAGNOSIS — N2581 Secondary hyperparathyroidism of renal origin: Secondary | ICD-10-CM | POA: Diagnosis not present

## 2020-10-05 DIAGNOSIS — Z7952 Long term (current) use of systemic steroids: Secondary | ICD-10-CM | POA: Diagnosis not present

## 2020-10-05 DIAGNOSIS — E876 Hypokalemia: Secondary | ICD-10-CM | POA: Diagnosis not present

## 2020-10-07 DIAGNOSIS — Z992 Dependence on renal dialysis: Secondary | ICD-10-CM | POA: Diagnosis not present

## 2020-10-07 DIAGNOSIS — T8610 Unspecified complication of kidney transplant: Secondary | ICD-10-CM | POA: Diagnosis not present

## 2020-10-07 DIAGNOSIS — N186 End stage renal disease: Secondary | ICD-10-CM | POA: Diagnosis not present

## 2020-10-08 DIAGNOSIS — D509 Iron deficiency anemia, unspecified: Secondary | ICD-10-CM | POA: Diagnosis not present

## 2020-10-08 DIAGNOSIS — Z992 Dependence on renal dialysis: Secondary | ICD-10-CM | POA: Diagnosis not present

## 2020-10-08 DIAGNOSIS — N2581 Secondary hyperparathyroidism of renal origin: Secondary | ICD-10-CM | POA: Diagnosis not present

## 2020-10-08 DIAGNOSIS — U071 COVID-19: Secondary | ICD-10-CM | POA: Diagnosis not present

## 2020-10-08 DIAGNOSIS — E876 Hypokalemia: Secondary | ICD-10-CM | POA: Diagnosis not present

## 2020-10-08 DIAGNOSIS — N186 End stage renal disease: Secondary | ICD-10-CM | POA: Diagnosis not present

## 2020-10-10 DIAGNOSIS — U071 COVID-19: Secondary | ICD-10-CM | POA: Diagnosis not present

## 2020-10-10 DIAGNOSIS — N186 End stage renal disease: Secondary | ICD-10-CM | POA: Diagnosis not present

## 2020-10-10 DIAGNOSIS — N2581 Secondary hyperparathyroidism of renal origin: Secondary | ICD-10-CM | POA: Diagnosis not present

## 2020-10-10 DIAGNOSIS — Z992 Dependence on renal dialysis: Secondary | ICD-10-CM | POA: Diagnosis not present

## 2020-10-10 DIAGNOSIS — D509 Iron deficiency anemia, unspecified: Secondary | ICD-10-CM | POA: Diagnosis not present

## 2020-10-10 DIAGNOSIS — E876 Hypokalemia: Secondary | ICD-10-CM | POA: Diagnosis not present

## 2020-10-11 DIAGNOSIS — E785 Hyperlipidemia, unspecified: Secondary | ICD-10-CM | POA: Diagnosis not present

## 2020-10-11 DIAGNOSIS — E039 Hypothyroidism, unspecified: Secondary | ICD-10-CM | POA: Diagnosis not present

## 2020-10-11 DIAGNOSIS — N185 Chronic kidney disease, stage 5: Secondary | ICD-10-CM | POA: Diagnosis not present

## 2020-10-11 DIAGNOSIS — E118 Type 2 diabetes mellitus with unspecified complications: Secondary | ICD-10-CM | POA: Diagnosis not present

## 2020-10-11 DIAGNOSIS — C44229 Squamous cell carcinoma of skin of left ear and external auricular canal: Secondary | ICD-10-CM | POA: Diagnosis not present

## 2020-10-12 DIAGNOSIS — E876 Hypokalemia: Secondary | ICD-10-CM | POA: Diagnosis not present

## 2020-10-12 DIAGNOSIS — N2581 Secondary hyperparathyroidism of renal origin: Secondary | ICD-10-CM | POA: Diagnosis not present

## 2020-10-12 DIAGNOSIS — D509 Iron deficiency anemia, unspecified: Secondary | ICD-10-CM | POA: Diagnosis not present

## 2020-10-12 DIAGNOSIS — U071 COVID-19: Secondary | ICD-10-CM | POA: Diagnosis not present

## 2020-10-12 DIAGNOSIS — N186 End stage renal disease: Secondary | ICD-10-CM | POA: Diagnosis not present

## 2020-10-12 DIAGNOSIS — Z992 Dependence on renal dialysis: Secondary | ICD-10-CM | POA: Diagnosis not present

## 2020-10-13 ENCOUNTER — Emergency Department (HOSPITAL_COMMUNITY): Payer: Medicare Other

## 2020-10-13 ENCOUNTER — Inpatient Hospital Stay (HOSPITAL_COMMUNITY)
Admission: EM | Admit: 2020-10-13 | Discharge: 2020-10-16 | DRG: 177 | Disposition: A | Discharge status: Home Health | Payer: Medicare Other | Attending: Internal Medicine | Admitting: Internal Medicine

## 2020-10-13 ENCOUNTER — Encounter (HOSPITAL_COMMUNITY): Payer: Self-pay

## 2020-10-13 DIAGNOSIS — R0902 Hypoxemia: Secondary | ICD-10-CM | POA: Diagnosis present

## 2020-10-13 DIAGNOSIS — N2581 Secondary hyperparathyroidism of renal origin: Secondary | ICD-10-CM | POA: Diagnosis not present

## 2020-10-13 DIAGNOSIS — R059 Cough, unspecified: Secondary | ICD-10-CM | POA: Diagnosis not present

## 2020-10-13 DIAGNOSIS — E782 Mixed hyperlipidemia: Secondary | ICD-10-CM | POA: Diagnosis present

## 2020-10-13 DIAGNOSIS — Z85828 Personal history of other malignant neoplasm of skin: Secondary | ICD-10-CM

## 2020-10-13 DIAGNOSIS — D631 Anemia in chronic kidney disease: Secondary | ICD-10-CM | POA: Diagnosis not present

## 2020-10-13 DIAGNOSIS — R531 Weakness: Secondary | ICD-10-CM | POA: Diagnosis not present

## 2020-10-13 DIAGNOSIS — Q603 Renal hypoplasia, unilateral: Secondary | ICD-10-CM | POA: Diagnosis not present

## 2020-10-13 DIAGNOSIS — E119 Type 2 diabetes mellitus without complications: Secondary | ICD-10-CM

## 2020-10-13 DIAGNOSIS — N186 End stage renal disease: Secondary | ICD-10-CM | POA: Diagnosis present

## 2020-10-13 DIAGNOSIS — B182 Chronic viral hepatitis C: Secondary | ICD-10-CM | POA: Diagnosis present

## 2020-10-13 DIAGNOSIS — Z992 Dependence on renal dialysis: Secondary | ICD-10-CM | POA: Diagnosis not present

## 2020-10-13 DIAGNOSIS — J441 Chronic obstructive pulmonary disease with (acute) exacerbation: Secondary | ICD-10-CM | POA: Diagnosis not present

## 2020-10-13 DIAGNOSIS — T8612 Kidney transplant failure: Secondary | ICD-10-CM | POA: Diagnosis present

## 2020-10-13 DIAGNOSIS — E559 Vitamin D deficiency, unspecified: Secondary | ICD-10-CM | POA: Diagnosis present

## 2020-10-13 DIAGNOSIS — U071 COVID-19: Secondary | ICD-10-CM | POA: Diagnosis not present

## 2020-10-13 DIAGNOSIS — Z794 Long term (current) use of insulin: Secondary | ICD-10-CM

## 2020-10-13 DIAGNOSIS — I120 Hypertensive chronic kidney disease with stage 5 chronic kidney disease or end stage renal disease: Secondary | ICD-10-CM | POA: Diagnosis not present

## 2020-10-13 DIAGNOSIS — D539 Nutritional anemia, unspecified: Secondary | ICD-10-CM | POA: Diagnosis present

## 2020-10-13 DIAGNOSIS — R000 Tachycardia, unspecified: Secondary | ICD-10-CM | POA: Diagnosis not present

## 2020-10-13 DIAGNOSIS — R9431 Abnormal electrocardiogram [ECG] [EKG]: Secondary | ICD-10-CM

## 2020-10-13 DIAGNOSIS — E039 Hypothyroidism, unspecified: Secondary | ICD-10-CM | POA: Diagnosis present

## 2020-10-13 DIAGNOSIS — R911 Solitary pulmonary nodule: Secondary | ICD-10-CM | POA: Diagnosis present

## 2020-10-13 DIAGNOSIS — J80 Acute respiratory distress syndrome: Secondary | ICD-10-CM | POA: Diagnosis not present

## 2020-10-13 DIAGNOSIS — I25110 Atherosclerotic heart disease of native coronary artery with unstable angina pectoris: Secondary | ICD-10-CM | POA: Diagnosis not present

## 2020-10-13 DIAGNOSIS — Z87891 Personal history of nicotine dependence: Secondary | ICD-10-CM

## 2020-10-13 DIAGNOSIS — K449 Diaphragmatic hernia without obstruction or gangrene: Secondary | ICD-10-CM | POA: Diagnosis not present

## 2020-10-13 DIAGNOSIS — J90 Pleural effusion, not elsewhere classified: Secondary | ICD-10-CM | POA: Diagnosis present

## 2020-10-13 DIAGNOSIS — N400 Enlarged prostate without lower urinary tract symptoms: Secondary | ICD-10-CM | POA: Diagnosis not present

## 2020-10-13 DIAGNOSIS — C3490 Malignant neoplasm of unspecified part of unspecified bronchus or lung: Secondary | ICD-10-CM

## 2020-10-13 DIAGNOSIS — R069 Unspecified abnormalities of breathing: Secondary | ICD-10-CM | POA: Diagnosis not present

## 2020-10-13 DIAGNOSIS — I1311 Hypertensive heart and chronic kidney disease without heart failure, with stage 5 chronic kidney disease, or end stage renal disease: Secondary | ICD-10-CM | POA: Diagnosis not present

## 2020-10-13 DIAGNOSIS — K219 Gastro-esophageal reflux disease without esophagitis: Secondary | ICD-10-CM | POA: Diagnosis present

## 2020-10-13 DIAGNOSIS — I2510 Atherosclerotic heart disease of native coronary artery without angina pectoris: Secondary | ICD-10-CM | POA: Diagnosis present

## 2020-10-13 DIAGNOSIS — Z888 Allergy status to other drugs, medicaments and biological substances status: Secondary | ICD-10-CM

## 2020-10-13 DIAGNOSIS — Z951 Presence of aortocoronary bypass graft: Secondary | ICD-10-CM

## 2020-10-13 DIAGNOSIS — Z79890 Hormone replacement therapy (postmenopausal): Secondary | ICD-10-CM

## 2020-10-13 DIAGNOSIS — I517 Cardiomegaly: Secondary | ICD-10-CM | POA: Diagnosis not present

## 2020-10-13 DIAGNOSIS — R0602 Shortness of breath: Secondary | ICD-10-CM | POA: Diagnosis not present

## 2020-10-13 DIAGNOSIS — Y830 Surgical operation with transplant of whole organ as the cause of abnormal reaction of the patient, or of later complication, without mention of misadventure at the time of the procedure: Secondary | ICD-10-CM | POA: Diagnosis present

## 2020-10-13 DIAGNOSIS — R188 Other ascites: Secondary | ICD-10-CM | POA: Diagnosis not present

## 2020-10-13 DIAGNOSIS — Z79899 Other long term (current) drug therapy: Secondary | ICD-10-CM

## 2020-10-13 DIAGNOSIS — E1122 Type 2 diabetes mellitus with diabetic chronic kidney disease: Secondary | ICD-10-CM | POA: Diagnosis not present

## 2020-10-13 LAB — BASIC METABOLIC PANEL WITH GFR
Anion gap: 12 (ref 5–15)
BUN: 14 mg/dL (ref 8–23)
CO2: 25 mmol/L (ref 22–32)
Calcium: 8.1 mg/dL — ABNORMAL LOW (ref 8.9–10.3)
Chloride: 101 mmol/L (ref 98–111)
Creatinine, Ser: 4.52 mg/dL — ABNORMAL HIGH (ref 0.61–1.24)
GFR, Estimated: 13 mL/min — ABNORMAL LOW
Glucose, Bld: 87 mg/dL (ref 70–99)
Potassium: 3.9 mmol/L (ref 3.5–5.1)
Sodium: 138 mmol/L (ref 135–145)

## 2020-10-13 LAB — RESP PANEL BY RT-PCR (FLU A&B, COVID) ARPGX2
Influenza A by PCR: NEGATIVE
Influenza B by PCR: NEGATIVE
SARS Coronavirus 2 by RT PCR: POSITIVE — AB

## 2020-10-13 LAB — CBC WITH DIFFERENTIAL/PLATELET
Abs Immature Granulocytes: 0.03 10*3/uL (ref 0.00–0.07)
Basophils Absolute: 0 10*3/uL (ref 0.0–0.1)
Basophils Relative: 0 %
Eosinophils Absolute: 0 10*3/uL (ref 0.0–0.5)
Eosinophils Relative: 0 %
HCT: 28.8 % — ABNORMAL LOW (ref 39.0–52.0)
Hemoglobin: 9 g/dL — ABNORMAL LOW (ref 13.0–17.0)
Immature Granulocytes: 1 %
Lymphocytes Relative: 34 %
Lymphs Abs: 1.8 10*3/uL (ref 0.7–4.0)
MCH: 31.9 pg (ref 26.0–34.0)
MCHC: 31.3 g/dL (ref 30.0–36.0)
MCV: 102.1 fL — ABNORMAL HIGH (ref 80.0–100.0)
Monocytes Absolute: 1.1 10*3/uL — ABNORMAL HIGH (ref 0.1–1.0)
Monocytes Relative: 21 %
Neutro Abs: 2.4 10*3/uL (ref 1.7–7.7)
Neutrophils Relative %: 44 %
Platelets: 169 10*3/uL (ref 150–400)
RBC: 2.82 MIL/uL — ABNORMAL LOW (ref 4.22–5.81)
RDW: 15 % (ref 11.5–15.5)
WBC: 5.3 10*3/uL (ref 4.0–10.5)
nRBC: 0 % (ref 0.0–0.2)

## 2020-10-13 LAB — BRAIN NATRIURETIC PEPTIDE: B Natriuretic Peptide: 390.3 pg/mL — ABNORMAL HIGH (ref 0.0–100.0)

## 2020-10-13 LAB — C-REACTIVE PROTEIN: CRP: 0.6 mg/dL (ref <1.0)

## 2020-10-13 LAB — LACTIC ACID, PLASMA
Lactic Acid, Venous: 1.4 mmol/L (ref 0.5–1.9)
Lactic Acid, Venous: 0.9 mmol/L (ref 0.5–1.9)

## 2020-10-13 LAB — FIBRINOGEN: Fibrinogen: 170 mg/dL — ABNORMAL LOW (ref 210–475)

## 2020-10-13 LAB — TROPONIN I (HIGH SENSITIVITY)
Troponin I (High Sensitivity): 151 ng/L
Troponin I (High Sensitivity): 116 ng/L (ref <18)

## 2020-10-13 LAB — FERRITIN: Ferritin: 534 ng/mL — ABNORMAL HIGH (ref 24–336)

## 2020-10-13 LAB — LACTATE DEHYDROGENASE: LDH: 168 U/L (ref 98–192)

## 2020-10-13 LAB — TRIGLYCERIDES: Triglycerides: 74 mg/dL (ref <150)

## 2020-10-13 LAB — PROCALCITONIN: Procalcitonin: 0.69 ng/mL

## 2020-10-13 MED ORDER — BENZONATATE 100 MG PO CAPS: 100.0000 mg | ORAL_CAPSULE | Freq: Three times a day (TID) | ORAL | Status: DC | PRN

## 2020-10-13 MED ORDER — SODIUM CHLORIDE 0.9 % IV BOLUS
500.0000 mL | Freq: Once | INTRAVENOUS | Status: AC
  Administered 2020-10-13: 500 mL via INTRAVENOUS

## 2020-10-13 MED ORDER — ASPIRIN 81 MG PO CHEW
162.0000 mg | CHEWABLE_TABLET | Freq: Once | ORAL | Status: AC
  Administered 2020-10-13: 162 mg via ORAL
  Filled 2020-10-13: qty 2

## 2020-10-13 MED ORDER — INSULIN ASPART 100 UNIT/ML ~~LOC~~ SOLN
0.0000 [IU] | Freq: Three times a day (TID) | SUBCUTANEOUS | Status: DC
  Administered 2020-10-14: 5 [IU] via SUBCUTANEOUS
  Administered 2020-10-14: 3 [IU] via SUBCUTANEOUS
  Administered 2020-10-14 – 2020-10-15 (×2): 2 [IU] via SUBCUTANEOUS
  Administered 2020-10-15: 3 [IU] via SUBCUTANEOUS
  Administered 2020-10-15: 2 [IU] via SUBCUTANEOUS

## 2020-10-13 MED ORDER — DEXAMETHASONE SODIUM PHOSPHATE 10 MG/ML IJ SOLN
6.0000 mg | Freq: Once | INTRAMUSCULAR | Status: AC
  Administered 2020-10-13: 6 mg via INTRAVENOUS
  Filled 2020-10-13: qty 1

## 2020-10-13 MED ORDER — IOHEXOL 350 MG/ML SOLN
100.0000 mL | Freq: Once | INTRAVENOUS | Status: AC | PRN
  Administered 2020-10-13: 67 mL via INTRAVENOUS

## 2020-10-13 MED ORDER — VITAMIN D 25 MCG (1000 UNIT) PO TABS
1000.0000 [IU] | ORAL_TABLET | Freq: Every day | ORAL | Status: DC
  Administered 2020-10-14 – 2020-10-16 (×3): 1000 [IU] via ORAL
  Filled 2020-10-13 (×3): qty 1

## 2020-10-13 MED ORDER — ALBUTEROL SULFATE HFA 108 (90 BASE) MCG/ACT IN AERS: 2.0000 | INHALATION_SPRAY | RESPIRATORY_TRACT | Status: DC | PRN

## 2020-10-13 MED ORDER — ZINC SULFATE 220 (50 ZN) MG PO CAPS
220.0000 mg | ORAL_CAPSULE | Freq: Every day | ORAL | Status: DC
  Administered 2020-10-14 – 2020-10-16 (×3): 220 mg via ORAL
  Filled 2020-10-13 (×4): qty 1

## 2020-10-13 MED ORDER — INSULIN ASPART 100 UNIT/ML ~~LOC~~ SOLN
0.0000 [IU] | Freq: Every day | SUBCUTANEOUS | Status: DC
  Administered 2020-10-14: 2 [IU] via SUBCUTANEOUS
  Administered 2020-10-14: 4 [IU] via SUBCUTANEOUS

## 2020-10-13 MED ORDER — ACETAMINOPHEN 325 MG PO TABS: 650.0000 mg | ORAL_TABLET | Freq: Four times a day (QID) | ORAL | Status: DC | PRN

## 2020-10-13 MED ORDER — ACETAMINOPHEN 325 MG PO TABS
325.0000 mg | ORAL_TABLET | Freq: Once | ORAL | Status: AC
  Administered 2020-10-13: 325 mg via ORAL
  Filled 2020-10-13: qty 1

## 2020-10-13 MED ORDER — ASCORBIC ACID 500 MG PO TABS
500.0000 mg | ORAL_TABLET | Freq: Every day | ORAL | Status: DC
  Administered 2020-10-14 – 2020-10-16 (×3): 500 mg via ORAL
  Filled 2020-10-13 (×3): qty 1

## 2020-10-13 MED ORDER — SODIUM CHLORIDE 0.9 % IV SOLN
200.0000 mg | Freq: Once | INTRAVENOUS | Status: AC
  Administered 2020-10-14: 200 mg via INTRAVENOUS
  Filled 2020-10-13: qty 40

## 2020-10-13 MED ORDER — SODIUM CHLORIDE 0.9 % IV SOLN
100.0000 mg | Freq: Every day | INTRAVENOUS | Status: AC
  Administered 2020-10-15 – 2020-10-16 (×2): 100 mg via INTRAVENOUS
  Filled 2020-10-13 (×2): qty 20

## 2020-10-13 MED ORDER — HEPARIN SODIUM (PORCINE) 5000 UNIT/ML IJ SOLN
5000.0000 [IU] | Freq: Three times a day (TID) | INTRAMUSCULAR | Status: DC
  Administered 2020-10-14 – 2020-10-16 (×8): 5000 [IU] via SUBCUTANEOUS
  Filled 2020-10-13 (×8): qty 1

## 2020-10-13 NOTE — ED Triage Notes (Signed)
Pt BIB [facility] for eval of SOB, weakness, vomiting x 2. Pt is currently a dialysis pt Tues/Thurs/Sat, reports no missed sessions. Pt reports onset of all symptoms this AM. Reports is vaccinated against covid, no known covid contacts.

## 2020-10-13 NOTE — ED Provider Notes (Signed)
[name] EMERGENCY DEPARTMENT Provider Note   CSN: [id number] Arrival date & time: 10/13/20  1825     History Chief Complaint  Patient presents with  . Shortness of Breath    Victor Schultz is a 70 y.o. male.  Patient presents chief complaint of cough fever generalized weakness.  He is lives at home alone, history of kidney disease on dialysis last dialyzed yesterday.  He states after dialysis he developed a cough fevers generalized weakness.  He had a visiting nurse come in today who was concerned about developing pneumonia to the patient to the ER.  He denies any pain at this time.  He states he was vaccinated already and has received a booster shot as well.        Past Medical History:  Diagnosis Date  . Abnormal hemoglobin (Hgb) ([place]) 12/08/2019  . Actinic keratosis 04/15/2020  . Acute upper GI bleeding 12/22/2019  . Anemia 04/15/2020  . Atherosclerotic heart disease of native coronary artery with unstable angina pectoris ([place])   . CAD in native artery 02/08/2016  . Cancer ([place])    skin cancer on both arms.  . Chronic hepatitis ([place]) 04/05/2020  . Chronic kidney disease, stage 4 (severe) ([place]) 01/02/2020  . 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 Contact dermatitis and other eczema due to other chemical products 04/15/2020  . Disorder of mineral metabolism, unspecified 06/18/2020  . Dizziness 05/08/2020  . End stage renal disease ([place]) 04/15/2020  . Epigastric abdominal tenderness without rebound tenderness 01/02/2020  . Essential hypertension 12/22/2019  . Gastroesophageal reflux disease without esophagitis 12/06/2019  . Hypertensive heart and chronic kidney disease without heart failure, with stage 5 chronic kidney disease, or end stage renal disease ([place])    ESR,kidney transplant  . Hypertensive heart and renal disease 01/02/2020  . Hypothyroidism   . Kidney transplant status, cadaveric 07/08/2017   Formatting of this note might be different from the  original. In the 1970s and again in 1982  . Long term (current) use of insulin ([place])   . Metabolic acidosis 01/13/8118  . Mixed hyperlipidemia   . Obstructive uropathy 06/18/2020  . Personal history of malignant neoplasm of skin 10/09/2010   SQUAMOUS CELL CARCINOMA- R AND L FOREARM, LEFT EAR SQUAMOUS CELL CARCINOMA- R AND L FOREARM, LEFT EAR  Formatting of this note might be different from the original. SQUAMOUS CELL CARCINOMA- R AND L FOREARM, LEFT EAR  . Presence of aortocoronary bypass graft 02/08/2016   Formatting of this note might be different from the original. in Oct of 2013 Formatting of this note might be different from the original. Formatting of this note might be different from the original. in Oct of 2013 Oct of 2013  Formatting of this note might be different from the original. Oct of 2013 Formatting of this note might be different from the original. in Oct of 2013 Formatting of this n  . Pure hypercholesterolemia 06/15/2020  . S/P CABG x 3 02/08/2016   in Oct of 2013 Oct of 2013  Formatting of this note might be different from the original. Oct of 2013  . Serum potassium elevated 12/08/2019  . Short of breath on exertion 07/06/2020  . Squamous cell carcinoma of skin of ear and external auditory canal 11/16/2010   SCC on left helix treated with Mohs surgery SCC on left helix treated with Mohs surgery  Formatting of this note might be different from the original. Overview:  SCC on left helix treated with  Mohs surgery  . Type 2 diabetes mellitus with chronic kidney disease, without long-term current use of insulin ([place]) 12/06/2019  . Unspecified disorder of kidney and ureter 06/15/2020  . Vitamin D deficiency 02/08/2016   In Jan 2017, his vitamin D level was 11 In Jan 2017, his vitamin D level was 32  Formatting of this note might be different from the original. In Jan 2017, his vitamin D level was 11  . Weight gain 07/06/2020  . Wheezing 07/06/2020    Patient Active Problem List   Diagnosis Date  Noted  . COVID-19 virus infection 10/13/2020  . Colon cancer screening 10/03/2020  . Esophagitis 10/03/2020  . Lung mass 09/06/2020  . Moderate protein-calorie malnutrition ([place]) 08/21/2020  . Iron deficiency anemia, unspecified 08/18/2020  . Nicotine dependence, other tobacco product, in remission 08/16/2020  . Multiple rib fractures involving four or more ribs 08/13/2020  . Allergy, unspecified, initial encounter 08/10/2020  . Anaphylactic shock, unspecified, initial encounter 08/10/2020  . Anemia in chronic kidney disease 08/10/2020  . Benign prostatic hyperplasia without lower urinary tract symptoms 08/10/2020  . Chronic obstructive pulmonary disease, unspecified ([place]) 08/10/2020  . Dependence on renal dialysis ([place]) 08/10/2020  . Hypothyroidism, unspecified 08/10/2020  . Other disorders of phosphorus metabolism 08/10/2020  . Other seizures ([place]) 08/10/2020  . Pain, unspecified 08/10/2020  . Pruritus, unspecified 08/10/2020  . Secondary hyperparathyroidism of renal origin ([place]) 08/10/2020  . Unspecified complication of kidney transplant 08/10/2020  . Unspecified malignant neoplasm of skin of other parts of face 08/10/2020  . Wheezing 07/06/2020  . Weight gain 07/06/2020  . Short of breath on exertion 07/06/2020  . Acute bronchitis with COPD ([place]) 07/06/2020  . Disorder of mineral metabolism, unspecified 06/18/2020  . Metabolic acidosis 16/07/9603  . Obstructive uropathy 06/18/2020  . Pure hypercholesterolemia 06/15/2020  . Unspecified disorder of kidney and ureter 06/15/2020  . Dizziness 05/08/2020  . Actinic keratosis 04/15/2020  . Anemia 04/15/2020  . Contact dermatitis and other eczema due to other chemical products 04/15/2020  . End stage renal disease ([place]) 04/15/2020  . Chronic hepatitis ([place]) 04/05/2020  . Restrictive lung disease 04/05/2020  . BMI 28.0-28.9,adult 04/05/2020  . Epigastric abdominal tenderness without rebound tenderness 01/02/2020  . Chronic kidney  disease, stage 4 (severe) ([place]) 01/02/2020  . Hypertensive heart and renal disease 01/02/2020  . Acute upper GI bleeding 12/22/2019  . Essential hypertension 12/22/2019  . Abnormal hemoglobin (Hgb) ([place]) 12/08/2019  . Serum potassium elevated 12/08/2019  . Type 2 diabetes mellitus with chronic kidney disease, without long-term current use of insulin ([place]) 12/06/2019  . Mixed hyperlipidemia 12/06/2019  . Gastroesophageal reflux disease without esophagitis 12/06/2019  . Kidney transplant status, cadaveric 07/08/2017  . CAD in native artery 02/08/2016  . Current every day smoker 02/08/2016  . S/P CABG x 3 02/08/2016  . Vitamin D deficiency 02/08/2016  . Presence of aortocoronary bypass graft 02/08/2016  . 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 Squamous cell carcinoma of skin of ear and external auditory canal 11/16/2010  . Personal history of malignant neoplasm of skin 10/09/2010    Past Surgical History:  Procedure Laterality Date  . BIOPSY  05/24/2020   Procedure: BIOPSY;  Surgeon: [name], MD;  Location: [facility];  Service: Endoscopy;;  . CATARACT EXTRACTION    . CORONARY ARTERY BYPASS GRAFT    . ESOPHAGOGASTRODUODENOSCOPY (EGD) WITH PROPOFOL Left 12/23/2019   Procedure: ESOPHAGOGASTRODUODENOSCOPY (EGD) WITH PROPOFOL;  Surgeon: [name], MD;  Location: [facility];  Service: Endoscopy;  Laterality: Left;  . ESOPHAGOGASTRODUODENOSCOPY (EGD) WITH PROPOFOL N/A 05/24/2020   Procedure: ESOPHAGOGASTRODUODENOSCOPY (EGD) WITH PROPOFOL;  Surgeon: [name], MD;  Location: [facility];  Service: Endoscopy;  Laterality: N/A;  . KIDNEY TRANSPLANT     1974, 1982  . TRANSURETHRAL INCISION OF PROSTATE N/A 05/09/2020   Procedure: TRANSURETHRAL INCISION OF THE PROSTATE (TUIP);  Surgeon: Irine Seal, MD;  Location: [facility];  Service: Urology;  Laterality: N/A;       Family History  Problem Relation Age of Onset  . Liver disease Mother   . Alzheimer's disease Father      Social History   Tobacco Use  . Smoking status: Former Smoker    Packs/day: 0.50    Years: 39.00    Pack years: 19.50    Types: Cigarettes    Quit date: 07/13/2020    Years since quitting: 0.2  . Smokeless tobacco: Never Used  Vaping Use  . Vaping Use: Never used  Substance Use Topics  . Alcohol use: Never  . Drug use: Never    Home Medications Prior to Admission medications   Medication Sig Start Date End Date Taking? Authorizing Provider  albuterol (VENTOLIN HFA) 108 (90 Base) MCG/ACT inhaler Inhale into the lungs every 4 (four) hours as needed. 07/15/20   [provider]  amLODipine (NORVASC) 5 MG tablet Take 1 tablet (5 mg total) by mouth daily. 09/20/20   Lillard Anes, MD  atorvastatin (LIPITOR) 20 MG tablet Take 1 tablet (20 mg total) by mouth daily. 09/20/20   Lillard Anes, MD  azaTHIOprine (IMURAN) 50 MG tablet Take 1 tablet (50 mg total) by mouth in the morning and at bedtime. 1 1/2 tablet in the morning and 1 tablet in the evening 09/20/20   Lillard Anes, MD  B-D UF III MINI PEN NEEDLES 31G X 5 MM MISC USE WITH LANTUS AS DIRECTED 09/17/19   [provider]  calcitRIOL (ROCALTROL) 0.25 MCG capsule Take 0.25 mcg by mouth daily. 06/16/20   [provider]  ferrous sulfate 325 (65 FE) MG tablet Take by mouth once a week. 07/15/20   [provider]  insulin glargine (LANTUS SOLOSTAR) 100 UNIT/ML Solostar Pen Inject 30 Units into the skin daily. Patient not taking: No sig reported 07/15/20   [provider]  LEVEMIR FLEXTOUCH 100 UNIT/ML FlexPen Inject into the skin. Patient not taking: No sig reported 08/15/20   [provider]  levothyroxine (SYNTHROID) 50 MCG tablet Take 1 tablet (50 mcg total) by mouth daily. 09/20/20   Lillard Anes, MD  LOKELMA 10 g PACK packet Take by mouth. Patient not taking: No sig reported 06/19/20   [provider]  metoprolol tartrate (LOPRESSOR) 25  MG tablet Take 0.5 tablets (12.5 mg total) by mouth 2 (two) times daily. 09/20/20   Lillard Anes, MD  pantoprazole (PROTONIX) 40 MG tablet Take 1 tablet (40 mg total) by mouth 2 (two) times daily. 09/20/20   Lillard Anes, MD  prednisoLONE 5 MG TABS tablet Take 5 mg by mouth every other day.    [provider]  sevelamer (RENAGEL) 800 MG tablet Take 1 tablet (800 mg total) by mouth in the morning and at bedtime. 09/20/20   Lillard Anes, MD  sodium bicarbonate 650 MG tablet Take 1,300 mg by mouth 2 (two) times daily. 10/05/19   [provider]  tamsulosin (FLOMAX) 0.4 MG CAPS capsule Take 1 capsule (0.4 mg total) by mouth daily. 09/20/20  Lillard Anes, MD  terazosin (HYTRIN) 1 MG capsule Take 1 capsule (1 mg total) by mouth daily. 09/20/20   Lillard Anes, MD  tiotropium (SPIRIVA HANDIHALER) 18 MCG inhalation capsule Place into inhaler and inhale. Patient not taking: No sig reported 07/15/20   [provider]  Vitamin D, Ergocalciferol, (DRISDOL) 1.25 MG (50000 UNIT) CAPS capsule Take 50,000 Units by mouth every Sunday.  07/25/19   [provider]    Allergies    Methoxy polyethylene glycol-epoetin beta  Review of Systems   Review of Systems  Constitutional: Positive for fever.  HENT: Negative for ear pain and sore throat.   Eyes: Negative for pain.  Respiratory: Positive for cough.   Cardiovascular: Negative for chest pain.  Gastrointestinal: Negative for abdominal pain.  Genitourinary: Negative for flank pain.  Musculoskeletal: Negative for back pain.  Skin: Negative for color change and rash.  Neurological: Negative for syncope.  All other systems reviewed and are negative.   Physical Exam Updated Vital Signs BP 124/69   Pulse (!) 103   Temp 98.9 F (37.2 C) (Oral)   Resp 17   Ht _0  (1.626 m)   Wt 71 kg   SpO2 96%   BMI 26.87 kg/m   Physical Exam Constitutional:      General: He is  not in acute distress.    Appearance: He is well-developed.  HENT:     Head: Normocephalic.     Nose: Nose normal.  Eyes:     Extraocular Movements: Extraocular movements intact.  Cardiovascular:     Rate and Rhythm: Normal rate.  Pulmonary:     Effort: Pulmonary effort is normal.  Skin:    Coloration: Skin is not jaundiced.  Neurological:     Mental Status: He is alert. Mental status is at baseline.     ED Results / Procedures / Treatments   Labs (all labs ordered are listed, but only abnormal results are displayed) Labs Reviewed  RESP PANEL BY RT-PCR (FLU A&B, COVID) ARPGX2 - Abnormal; Notable for the following components:      Result Value   SARS Coronavirus 2 by RT PCR POSITIVE (*)    All other components within normal limits  CBC WITH DIFFERENTIAL/PLATELET - Abnormal; Notable for the following components:   RBC 2.82 (*)    Hemoglobin 9.0 (*)    HCT 28.8 (*)    MCV 102.1 (*)    Monocytes Absolute 1.1 (*)    All other components within normal limits  BASIC METABOLIC PANEL - Abnormal; Notable for the following components:   Creatinine, Ser 4.52 (*)    Calcium 8.1 (*)    GFR, Estimated 13 (*)    All other components within normal limits  BRAIN NATRIURETIC PEPTIDE - Abnormal; Notable for the following components:   B Natriuretic Peptide 390.3 (*)    All other components within normal limits  FIBRINOGEN - Abnormal; Notable for the following components:   Fibrinogen 170 (*)    All other components within normal limits  TROPONIN I (HIGH SENSITIVITY) - Abnormal; Notable for the following components:   Troponin I (High Sensitivity) 151 (*)    All other components within normal limits  TROPONIN I (HIGH SENSITIVITY) - Abnormal; Notable for the following components:   Troponin I (High Sensitivity) 116 (*)    All other components within normal limits  CULTURE, BLOOD (ROUTINE X 2)  CULTURE, BLOOD (ROUTINE X 2)  LACTIC ACID, PLASMA  LACTIC ACID, PLASMA  LACTATE  DEHYDROGENASE  TRIGLYCERIDES  PROCALCITONIN  FERRITIN  C-REACTIVE PROTEIN    EKG EKG Interpretation  Date/Time:  Friday October 13 2020 18:27:48 EST Ventricular Rate:  119 PR Interval:    QRS Duration: 73 QT Interval:  358 QTC Calculation: 504 R Axis:   5 Text Interpretation: Sinus tachycardia Probable left atrial enlargement Borderline T abnormalities, anterior leads Prolonged QT interval Confirmed by Thamas Jaegers ([id number]) on 10/13/2020 8:00:54 PM   Radiology CT Angio Chest PE W/Cm &/Or Wo Cm  Result Date: 10/13/2020 CLINICAL DATA:  PE suspected, high prob Shortness of breath and weakness. EXAM: CT ANGIOGRAPHY CHEST WITH CONTRAST TECHNIQUE: Multidetector CT imaging of the chest was performed using the standard protocol during bolus administration of intravenous contrast. Multiplanar CT image reconstructions and MIPs were obtained to evaluate the vascular anatomy. CONTRAST:  66m OMNIPAQUE IOHEXOL 350 MG/ML SOLN COMPARISON:  PET CT 09/07/2020. FINDINGS: Cardiovascular: There are no filling defects within the pulmonary arteries to suggest pulmonary embolus. Right-sided dialysis catheter remains in place. Atherosclerosis of the thoracic aorta with tortuosity. No aortic dissection or acute aortic findings. Multi chamber cardiomegaly. Post CABG with dense calcification of native coronary arteries. No pericardial effusion. Mediastinum/Nodes: No enlarged mediastinal or hilar lymph nodes. Small hiatal hernia. Small amount of low-density fluid adjacent to the distal esophagus likely residual tracking ascites, improved from prior exam. No visualized thyroid nodule. Lungs/Pleura: Improved right pleural effusion from prior exam. Trace left pleural effusion is unchanged. Anterior left lower lobe pulmonary nodule measures 2.1 x 1.4 cm, unchanged from recent PET. Subpleural reticulation and areas of honeycombing are unchanged from prior exam. No acute airspace disease. Scattered calcified granuloma. Upper  Abdomen: Small volume abdominal ascites. No acute upper abdominal findings. Musculoskeletal: Median sternotomy. Faint sclerotic density within T10 is unchanged from prior imaging, not hypermetabolic on prior PET. Mild chronic loss of height of upper thoracic vertebra. No acute osseous abnormalities are seen. Review of the MIP images confirms the above findings. IMPRESSION: 1. No pulmonary embolus or acute intrathoracic abnormality. 2. Improved right pleural effusion from prior exam. Trace left pleural effusion is unchanged. 3. Unchanged left lower lobe pulmonary nodule. 4. Grossly stable interstitial lung disease allowing for motion on the current exam. Aortic Atherosclerosis (ICD10-I70.0). Electronically Signed   By: MKeith RakeM.D.   On: 10/13/2020 21:43   DG Chest Port 1 View  Result Date: 10/13/2020 CLINICAL DATA:  Shortness of breath and weakness. EXAM: PORTABLE CHEST 1 VIEW COMPARISON:  07/07/2020 FINDINGS: Heart size is stable but enlarged. The patient is status post prior median sternotomy. There is a well-positioned tunneled dialysis catheter on the right. There is stable bibasilar atelectasis and scarring. There is no pneumothorax. No large pleural effusion. No acute osseous abnormality. IMPRESSION: No acute abnormality. Electronically Signed   By: CConstance HolsterM.D.   On: 10/13/2020 19:19    Procedures .Critical Care Performed by: HLuna Fuse MD Authorized by: HLuna Fuse MD   Critical care provider statement:    Critical care time (minutes):  30   Critical care time was exclusive of:  Separately billable procedures and treating other patients   Critical care was necessary to treat or prevent imminent or life-threatening deterioration of the following conditions:  Respiratory failure Comments:     90% on room air which is low.   (including critical care time)  Medications Ordered in ED Medications  acetaminophen (TYLENOL) tablet 325 mg (325 mg Oral Given 10/13/20  1916)  sodium chloride 0.9 % bolus 500 mL (  0 mLs Intravenous Stopped 10/13/20 2040)  aspirin chewable tablet 162 mg (162 mg Oral Given 10/13/20 2055)  dexamethasone (DECADRON) injection 6 mg (6 mg Intravenous Given 10/13/20 2207)  iohexol (OMNIPAQUE) 350 MG/ML injection 100 mL (67 mLs Intravenous Contrast Given 10/13/20 2127)    ED Course  I have reviewed the triage vital signs and the nursing notes.  Pertinent labs & imaging results that were available during my care of the patient were reviewed by me and considered in my medical decision making (see chart for details).    MDM Rules/Calculators/A&P                          End-stage kidney disease.  White count normal chemistry normal.  Troponin mildly elevated, however likely due to his background of kidney disease as the patient has no chest pain or chest discomfort.  Covid virus panel returned positive which is likely the cause of his hypoxemia and fever at home.  Given IV Decadron.  Patient satting 100% on 2 L nasal cannula which is appropriate.  Admitted to the hospitalist team. Final Clinical Impression(s) / ED Diagnoses Final diagnoses:  COVID-19 virus infection  Hypoxemia    Rx / DC Orders ED Discharge Orders    None       Luna Fuse, MD 10/13/20 2307

## 2020-10-13 NOTE — H&P (Signed)
History and Physical    Jagjit Riner QTM:[id number] DOB: 1950-11-10 DOA: 10/13/2020  PCP: Lillard Anes, MD Patient coming from: Home  Chief Complaint: Shortness of breath  HPI: Victor Schultz is a 70 y.o. male with medical history significant of ESRD on HD TTS, insulin-dependent type II diabetes, hypertension, hyperlipidemia, CAD, hypothyroidism presented to the ED with complaints of cough, fever, and generalized weakness.  Per ED providers note, patient's last dialysis session was yesterday and he is fully vaccinated against Covid, including booster shot.  At the time of my evaluation, patient appeared somnolent.  Did wake up and move all extremities on command.  Oriented to person and place.  He did not answer any questions.    ED Course: Afebrile.  Slightly tachycardic.  Not tachypneic.  Sats as low as 90% on room air.  Not hypotensive.  SARS-CoV-2 PCR test positive.  Influenza panel negative.  WBC 5.3, hemoglobin 9.0, hematocrit 28.8, platelet count 169K.  Sodium 138, potassium 3.9, chloride 101, bicarb 25, BUN 14, creatinine 4.5, glucose 87.  High-sensitivity troponin slightly elevated but stable (151> 116).  Lactic acid normal x2.  BNP 390.  Blood culture x2 pending.  Procalcitonin level pending.  LDH within normal range.  Fibrinogen below normal range.  Ferritin 534.  CRP 0.6.  Triglycerides within normal range.  Chest x-ray showing no acute abnormality.  CT angiogram negative for PE or acute intrathoracic abnormality.  Showing improved right pleural effusion from prior exam.  Trace left pleural effusion is unchanged.  Unchanged left lower lobe pulmonary nodule.  Grossly stable interstitial lung disease.  Patient was given Tylenol, aspirin, IV Decadron 6 mg, and a 500 cc normal saline bolus.  Review of Systems:  All systems reviewed and apart from history of presenting illness, are negative.  Past Medical History:  Diagnosis Date  . Abnormal hemoglobin (Hgb) ([place]) 12/08/2019  .  Actinic keratosis 04/15/2020  . Acute upper GI bleeding 12/22/2019  . Anemia 04/15/2020  . Atherosclerotic heart disease of native coronary artery with unstable angina pectoris ([place])   . CAD in native artery 02/08/2016  . Cancer ([place])    skin cancer on both arms.  . Chronic hepatitis ([place]) 04/05/2020  . Chronic kidney disease, stage 4 (severe) ([place]) 01/02/2020  . 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 Contact dermatitis and other eczema due to other chemical products 04/15/2020  . Disorder of mineral metabolism, unspecified 06/18/2020  . Dizziness 05/08/2020  . End stage renal disease ([place]) 04/15/2020  . Epigastric abdominal tenderness without rebound tenderness 01/02/2020  . Essential hypertension 12/22/2019  . Gastroesophageal reflux disease without esophagitis 12/06/2019  . Hypertensive heart and chronic kidney disease without heart failure, with stage 5 chronic kidney disease, or end stage renal disease ([place])    ESR,kidney transplant  . Hypertensive heart and renal disease 01/02/2020  . Hypothyroidism   . Kidney transplant status, cadaveric 07/08/2017   Formatting of this note might be different from the original. In the 1970s and again in 1982  . Long term (current) use of insulin ([place])   . Metabolic acidosis 03/31/6388  . Mixed hyperlipidemia   . Obstructive uropathy 06/18/2020  . Personal history of malignant neoplasm of skin 10/09/2010   SQUAMOUS CELL CARCINOMA- R AND L FOREARM, LEFT EAR SQUAMOUS CELL CARCINOMA- R AND L FOREARM, LEFT EAR  Formatting of this note might be different from the original. SQUAMOUS CELL CARCINOMA- R AND L FOREARM, LEFT EAR  . Presence of aortocoronary bypass graft 02/08/2016  Formatting of this note might be different from the original. in Oct of 2013 Formatting of this note might be different from the original. Formatting of this note might be different from the original. in Oct of 2013 Oct of 2013  Formatting of this note might be different from the original. Oct of  2013 Formatting of this note might be different from the original. in Oct of 2013 Formatting of this n  . Pure hypercholesterolemia 06/15/2020  . S/P CABG x 3 02/08/2016   in Oct of 2013 Oct of 2013  Formatting of this note might be different from the original. Oct of 2013  . Serum potassium elevated 12/08/2019  . Short of breath on exertion 07/06/2020  . Squamous cell carcinoma of skin of ear and external auditory canal 11/16/2010   SCC on left helix treated with Mohs surgery SCC on left helix treated with Mohs surgery  Formatting of this note might be different from the original. Overview:  SCC on left helix treated with Mohs surgery  . Type 2 diabetes mellitus with chronic kidney disease, without long-term current use of insulin ([place]) 12/06/2019  . Unspecified disorder of kidney and ureter 06/15/2020  . Vitamin D deficiency 02/08/2016   In Jan 2017, his vitamin D level was 11 In Jan 2017, his vitamin D level was 31  Formatting of this note might be different from the original. In Jan 2017, his vitamin D level was 11  . Weight gain 07/06/2020  . Wheezing 07/06/2020    Past Surgical History:  Procedure Laterality Date  . BIOPSY  05/24/2020   Procedure: BIOPSY;  Surgeon: [name], MD;  Location: [facility];  Service: Endoscopy;;  . CATARACT EXTRACTION    . CORONARY ARTERY BYPASS GRAFT    . ESOPHAGOGASTRODUODENOSCOPY (EGD) WITH PROPOFOL Left 12/23/2019   Procedure: ESOPHAGOGASTRODUODENOSCOPY (EGD) WITH PROPOFOL;  Surgeon: [name], MD;  Location: [facility];  Service: Endoscopy;  Laterality: Left;  . ESOPHAGOGASTRODUODENOSCOPY (EGD) WITH PROPOFOL N/A 05/24/2020   Procedure: ESOPHAGOGASTRODUODENOSCOPY (EGD) WITH PROPOFOL;  Surgeon: [name], MD;  Location: [facility];  Service: Endoscopy;  Laterality: N/A;  . KIDNEY TRANSPLANT     1974, 1982  . TRANSURETHRAL INCISION OF PROSTATE N/A 05/09/2020   Procedure: TRANSURETHRAL INCISION OF THE PROSTATE (TUIP);  Surgeon: Irine Seal, MD;   Location: [facility];  Service: Urology;  Laterality: N/A;     reports that he quit smoking about 3 months ago. His smoking use included cigarettes. He has a 19.50 pack-year smoking history. He has never used smokeless tobacco. He reports that he does not drink alcohol and does not use drugs.  Allergies  Allergen Reactions  . Methoxy Polyethylene Glycol-Epoetin Beta Other (See Comments)    Family History  Problem Relation Age of Onset  . Liver disease Mother   . Alzheimer's disease Father     Prior to Admission medications   Medication Sig Start Date End Date Taking? Authorizing Provider  albuterol (VENTOLIN HFA) 108 (90 Base) MCG/ACT inhaler Inhale into the lungs every 4 (four) hours as needed. 07/15/20   [provider]  amLODipine (NORVASC) 5 MG tablet Take 1 tablet (5 mg total) by mouth daily. 09/20/20   Lillard Anes, MD  atorvastatin (LIPITOR) 20 MG tablet Take 1 tablet (20 mg total) by mouth daily. 09/20/20   Lillard Anes, MD  azaTHIOprine (IMURAN) 50 MG tablet Take 1 tablet (50 mg total) by mouth in the morning and at bedtime. 1 1/2 tablet in the morning  and 1 tablet in the evening 09/20/20   Lillard Anes, MD  B-D UF III MINI PEN NEEDLES 31G X 5 MM MISC USE WITH LANTUS AS DIRECTED 09/17/19   [provider]  calcitRIOL (ROCALTROL) 0.25 MCG capsule Take 0.25 mcg by mouth daily. 06/16/20   [provider]  ferrous sulfate 325 (65 FE) MG tablet Take by mouth once a week. 07/15/20   [provider]  insulin glargine (LANTUS SOLOSTAR) 100 UNIT/ML Solostar Pen Inject 30 Units into the skin daily. Patient not taking: No sig reported 07/15/20   [provider]  LEVEMIR FLEXTOUCH 100 UNIT/ML FlexPen Inject into the skin. Patient not taking: No sig reported 08/15/20   [provider]  levothyroxine (SYNTHROID) 50 MCG tablet Take 1 tablet (50 mcg total) by mouth daily. 09/20/20   Lillard Anes, MD  LOKELMA  10 g PACK packet Take by mouth. Patient not taking: No sig reported 06/19/20   [provider]  metoprolol tartrate (LOPRESSOR) 25 MG tablet Take 0.5 tablets (12.5 mg total) by mouth 2 (two) times daily. 09/20/20   Lillard Anes, MD  pantoprazole (PROTONIX) 40 MG tablet Take 1 tablet (40 mg total) by mouth 2 (two) times daily. 09/20/20   Lillard Anes, MD  prednisoLONE 5 MG TABS tablet Take 5 mg by mouth every other day.    [provider]  sevelamer (RENAGEL) 800 MG tablet Take 1 tablet (800 mg total) by mouth in the morning and at bedtime. 09/20/20   Lillard Anes, MD  sodium bicarbonate 650 MG tablet Take 1,300 mg by mouth 2 (two) times daily. 10/05/19   [provider]  tamsulosin (FLOMAX) 0.4 MG CAPS capsule Take 1 capsule (0.4 mg total) by mouth daily. 09/20/20   Lillard Anes, MD  terazosin (HYTRIN) 1 MG capsule Take 1 capsule (1 mg total) by mouth daily. 09/20/20   Lillard Anes, MD  tiotropium (SPIRIVA HANDIHALER) 18 MCG inhalation capsule Place into inhaler and inhale. Patient not taking: No sig reported 07/15/20   [provider]  Vitamin D, Ergocalciferol, (DRISDOL) 1.25 MG (50000 UNIT) CAPS capsule Take 50,000 Units by mouth every Sunday.  07/25/19   [provider]    Physical Exam: Vitals:   10/13/20 2245 10/13/20 2315 10/14/20 0030 10/14/20 0145  BP: 124/69 126/67 140/68 138/73  Pulse: (!) 103 99 99 95  Resp: _0 Temp:      TempSrc:      SpO2: 96% 90% 92% 92%  Weight:      Height:        Physical Exam Constitutional:      General: He is not in acute distress.    Appearance: He is not diaphoretic.  HENT:     Head: Normocephalic and atraumatic.  Eyes:     Conjunctiva/sclera: Conjunctivae normal.  Cardiovascular:     Rate and Rhythm: Normal rate and regular rhythm.     Pulses: Normal pulses.  Pulmonary:     Effort: Pulmonary effort is normal. No respiratory distress.      Breath sounds: No wheezing.  Abdominal:     General: Bowel sounds are normal. There is no distension.     Palpations: Abdomen is soft.     Tenderness: There is no abdominal tenderness.  Musculoskeletal:        General: No swelling or tenderness.     Cervical back: Normal range of motion and neck supple.  Skin:  General: Skin is warm and dry.  Neurological:     General: No focal deficit present.     Comments: Somnolent but waking up and moving all extremities Oriented to person and place     Labs on Admission: I have personally reviewed following labs and imaging studies  CBC: Recent Labs  Lab 10/13/20 1847  WBC 5.3  NEUTROABS 2.4  HGB 9.0*  HCT 28.8*  MCV 102.1*  PLT 970   Basic Metabolic Panel: Recent Labs  Lab 10/13/20 1847  NA 138  K 3.9  CL 101  CO2 25  GLUCOSE 87  BUN 14  CREATININE 4.52*  CALCIUM 8.1*   GFR: Estimated Creatinine Clearance: 12.7 mL/min (A) (by C-G formula based on SCr of 4.52 mg/dL (H)). Liver Function Tests: No results for input(s): AST, ALT, ALKPHOS, BILITOT, PROT, ALBUMIN in the last 168 hours. No results for input(s): LIPASE, AMYLASE in the last 168 hours. No results for input(s): AMMONIA in the last 168 hours. Coagulation Profile: No results for input(s): INR, PROTIME in the last 168 hours. Cardiac Enzymes: No results for input(s): CKTOTAL, CKMB, CKMBINDEX, TROPONINI in the last 168 hours. BNP (last 3 results) No results for input(s): PROBNP in the last 8760 hours. HbA1C: No results for input(s): HGBA1C in the last 72 hours. CBG: Recent Labs  Lab 10/14/20 0145  GLUCAP 307*   Lipid Profile: Recent Labs    10/13/20 2218  TRIG 74   Thyroid Function Tests: No results for input(s): TSH, T4TOTAL, FREET4, T3FREE, THYROIDAB in the last 72 hours. Anemia Panel: Recent Labs    10/13/20 2216  FERRITIN 534*   Urine analysis:    Component Value Date/Time   COLORURINE YELLOW 12/23/2019 1740   APPEARANCEUR CLEAR  12/23/2019 1740   LABSPEC 1.012 12/23/2019 1740   PHURINE 6.0 12/23/2019 1740   GLUCOSEU 150 (A) 12/23/2019 1740   HGBUR SMALL (A) 12/23/2019 1740   BILIRUBINUR NEGATIVE 12/23/2019 1740   KETONESUR NEGATIVE 12/23/2019 1740   PROTEINUR >=300 (A) 12/23/2019 1740   NITRITE NEGATIVE 12/23/2019 1740   LEUKOCYTESUR NEGATIVE 12/23/2019 1740    Radiological Exams on Admission: CT Angio Chest PE W/Cm &/Or Wo Cm  Result Date: 10/13/2020 CLINICAL DATA:  PE suspected, high prob Shortness of breath and weakness. EXAM: CT ANGIOGRAPHY CHEST WITH CONTRAST TECHNIQUE: Multidetector CT imaging of the chest was performed using the standard protocol during bolus administration of intravenous contrast. Multiplanar CT image reconstructions and MIPs were obtained to evaluate the vascular anatomy. CONTRAST:  61m OMNIPAQUE IOHEXOL 350 MG/ML SOLN COMPARISON:  PET CT 09/07/2020. FINDINGS: Cardiovascular: There are no filling defects within the pulmonary arteries to suggest pulmonary embolus. Right-sided dialysis catheter remains in place. Atherosclerosis of the thoracic aorta with tortuosity. No aortic dissection or acute aortic findings. Multi chamber cardiomegaly. Post CABG with dense calcification of native coronary arteries. No pericardial effusion. Mediastinum/Nodes: No enlarged mediastinal or hilar lymph nodes. Small hiatal hernia. Small amount of low-density fluid adjacent to the distal esophagus likely residual tracking ascites, improved from prior exam. No visualized thyroid nodule. Lungs/Pleura: Improved right pleural effusion from prior exam. Trace left pleural effusion is unchanged. Anterior left lower lobe pulmonary nodule measures 2.1 x 1.4 cm, unchanged from recent PET. Subpleural reticulation and areas of honeycombing are unchanged from prior exam. No acute airspace disease. Scattered calcified granuloma. Upper Abdomen: Small volume abdominal ascites. No acute upper abdominal findings. Musculoskeletal: Median  sternotomy. Faint sclerotic density within T10 is unchanged from prior imaging, not hypermetabolic on prior PET.  Mild chronic loss of height of upper thoracic vertebra. No acute osseous abnormalities are seen. Review of the MIP images confirms the above findings. IMPRESSION: 1. No pulmonary embolus or acute intrathoracic abnormality. 2. Improved right pleural effusion from prior exam. Trace left pleural effusion is unchanged. 3. Unchanged left lower lobe pulmonary nodule. 4. Grossly stable interstitial lung disease allowing for motion on the current exam. Aortic Atherosclerosis (ICD10-I70.0). Electronically Signed   By: Melanie  Sanford M.D.   On: 10/13/2020 21:43   DG Chest Port 1 View  Result Date: 10/13/2020 CLINICAL DATA:  Shortness of breath and weakness. EXAM: PORTABLE CHEST 1 VIEW COMPARISON:  07/07/2020 FINDINGS: Heart size is stable but enlarged. The patient is status post prior median sternotomy. There is a well-positioned tunneled dialysis catheter on the right. There is stable bibasilar atelectasis and scarring. There is no pneumothorax. No large pleural effusion. No acute osseous abnormality. IMPRESSION: No acute abnormality. Electronically Signed   By: Christopher  Green M.D.   On: 10/13/2020 19:19    EKG: Independently reviewed.  Sinus tachycardia, QTC 504.  Assessment/Plan Principal Problem:   COVID-19 virus infection Active Problems:   CAD in native artery   Hypothyroidism, unspecified   QT prolongation   Type 2 diabetes mellitus (HCC)   COVID-19 viral infection: Patient presenting with complaints of a cough.  SARS-CoV-2 PCR test positive.  Per ED provider note, he is fully vaccinated against Covid including booster shot.  CT angiogram negative for PE and not showing evidence of pneumonia.  However, he is satting as low as 90% on room air at rest.  No fever or leukocytosis.  Lactic acid normal x2.  Procalcitonin elevated at 0.69.  CRP 0.6. -Remdesivir -IV Decadron 6 mg  daily -Vancomycin and cefepime started given elevated procalcitonin.  Blood culture x2 pending. -Vitamin C, zinc, vitamin D -Antitussive as needed -Bronchodilator as needed -Check D-dimer level -Daily CBC with differential, CMP, CRP, D-dimer -Airborne and contact precautions -Incentive spirometry, flutter valve -Continuous pulse ox -Supplemental oxygen as needed to keep oxygen saturation above 90% -Encourage mobility-PT/OT  ESRD on HD TTS: Does not appear volume overloaded on exam.  Does have pleural effusions on CT which appear stable.  Potassium and bicarb levels normal.  No indication for urgent dialysis overnight. -Consult nephrology in the morning.  Chronic macrocytic anemia: Hemoglobin 9.0, no significant change from baseline. -Continue to monitor  Mild troponin elevation, history of CAD: ACS less likely as high-sensitivity troponin is slightly elevated but stable (151 > 116) and EKG without acute changes.  Patient is not endorsing chest pain.  Slight troponin elevation could possibly be due to demand ischemia in the setting of Covid infection. -Resume home meds for CAD after pharmacy med rec is done.  Echocardiogram ordered.  Insulin-dependent type 2 diabetes -Check A1c.  Sliding scale insulin moderate ACHS.  Resume home basal insulin after pharmacy med rec is done.  QT prolongation EKG -Cardiac monitoring.  Monitor potassium and magnesium levels.  Avoid QT prolonging drugs if possible.  Repeat EKG in a.m.  Hypertension: Stable. Hyperlipidemia Hypothyroidism -Pharmacy med rec pending.  DVT prophylaxis: Subcutaneous heparin Code Status: Full code Family Communication: No family available at this time. Disposition Plan: Status is: Observation  The patient remains OBS appropriate and will d/c before 2 midnights.  Dispo: The patient is from: Home              Anticipated d/c is to: Home                Anticipated d/c date is: 2 days              Patient currently is not  medically stable to d/c.  The medical decision making on this patient was of high complexity and the patient is at high risk for clinical deterioration, therefore this is a level 3 visit.  Shela Leff MD [facility]  If 7PM-7AM, please contact night-coverage [email or web address]  10/14/2020, 3:26 AM

## 2020-10-14 ENCOUNTER — Other Ambulatory Visit: Payer: Self-pay

## 2020-10-14 ENCOUNTER — Other Ambulatory Visit (HOSPITAL_COMMUNITY): Payer: Medicare Other

## 2020-10-14 ENCOUNTER — Inpatient Hospital Stay (HOSPITAL_COMMUNITY): Payer: Medicare Other

## 2020-10-14 DIAGNOSIS — N186 End stage renal disease: Secondary | ICD-10-CM | POA: Diagnosis not present

## 2020-10-14 DIAGNOSIS — Z992 Dependence on renal dialysis: Secondary | ICD-10-CM | POA: Diagnosis not present

## 2020-10-14 DIAGNOSIS — E1122 Type 2 diabetes mellitus with diabetic chronic kidney disease: Secondary | ICD-10-CM

## 2020-10-14 DIAGNOSIS — R531 Weakness: Secondary | ICD-10-CM | POA: Diagnosis not present

## 2020-10-14 DIAGNOSIS — R0902 Hypoxemia: Secondary | ICD-10-CM | POA: Diagnosis not present

## 2020-10-14 DIAGNOSIS — Z794 Long term (current) use of insulin: Secondary | ICD-10-CM | POA: Diagnosis not present

## 2020-10-14 DIAGNOSIS — Q603 Renal hypoplasia, unilateral: Secondary | ICD-10-CM | POA: Diagnosis not present

## 2020-10-14 DIAGNOSIS — Z87891 Personal history of nicotine dependence: Secondary | ICD-10-CM | POA: Diagnosis not present

## 2020-10-14 DIAGNOSIS — I120 Hypertensive chronic kidney disease with stage 5 chronic kidney disease or end stage renal disease: Secondary | ICD-10-CM | POA: Diagnosis not present

## 2020-10-14 DIAGNOSIS — Z85828 Personal history of other malignant neoplasm of skin: Secondary | ICD-10-CM | POA: Diagnosis not present

## 2020-10-14 DIAGNOSIS — I25110 Atherosclerotic heart disease of native coronary artery with unstable angina pectoris: Secondary | ICD-10-CM | POA: Diagnosis not present

## 2020-10-14 DIAGNOSIS — I2510 Atherosclerotic heart disease of native coronary artery without angina pectoris: Secondary | ICD-10-CM | POA: Diagnosis not present

## 2020-10-14 DIAGNOSIS — I340 Nonrheumatic mitral (valve) insufficiency: Secondary | ICD-10-CM | POA: Diagnosis not present

## 2020-10-14 DIAGNOSIS — R9431 Abnormal electrocardiogram [ECG] [EKG]: Secondary | ICD-10-CM

## 2020-10-14 DIAGNOSIS — D539 Nutritional anemia, unspecified: Secondary | ICD-10-CM | POA: Diagnosis present

## 2020-10-14 DIAGNOSIS — E559 Vitamin D deficiency, unspecified: Secondary | ICD-10-CM | POA: Diagnosis present

## 2020-10-14 DIAGNOSIS — R0602 Shortness of breath: Secondary | ICD-10-CM

## 2020-10-14 DIAGNOSIS — J90 Pleural effusion, not elsewhere classified: Secondary | ICD-10-CM | POA: Diagnosis not present

## 2020-10-14 DIAGNOSIS — U071 COVID-19: Secondary | ICD-10-CM | POA: Diagnosis not present

## 2020-10-14 DIAGNOSIS — D631 Anemia in chronic kidney disease: Secondary | ICD-10-CM | POA: Diagnosis present

## 2020-10-14 DIAGNOSIS — E782 Mixed hyperlipidemia: Secondary | ICD-10-CM | POA: Diagnosis present

## 2020-10-14 DIAGNOSIS — N2581 Secondary hyperparathyroidism of renal origin: Secondary | ICD-10-CM | POA: Diagnosis not present

## 2020-10-14 DIAGNOSIS — K219 Gastro-esophageal reflux disease without esophagitis: Secondary | ICD-10-CM | POA: Diagnosis not present

## 2020-10-14 DIAGNOSIS — R911 Solitary pulmonary nodule: Secondary | ICD-10-CM | POA: Diagnosis present

## 2020-10-14 DIAGNOSIS — T8612 Kidney transplant failure: Secondary | ICD-10-CM | POA: Diagnosis not present

## 2020-10-14 DIAGNOSIS — E039 Hypothyroidism, unspecified: Secondary | ICD-10-CM | POA: Diagnosis present

## 2020-10-14 DIAGNOSIS — E119 Type 2 diabetes mellitus without complications: Secondary | ICD-10-CM

## 2020-10-14 DIAGNOSIS — R059 Cough, unspecified: Secondary | ICD-10-CM | POA: Diagnosis not present

## 2020-10-14 DIAGNOSIS — Z79899 Other long term (current) drug therapy: Secondary | ICD-10-CM | POA: Diagnosis not present

## 2020-10-14 DIAGNOSIS — Y830 Surgical operation with transplant of whole organ as the cause of abnormal reaction of the patient, or of later complication, without mention of misadventure at the time of the procedure: Secondary | ICD-10-CM | POA: Diagnosis present

## 2020-10-14 DIAGNOSIS — B182 Chronic viral hepatitis C: Secondary | ICD-10-CM | POA: Diagnosis not present

## 2020-10-14 DIAGNOSIS — Z951 Presence of aortocoronary bypass graft: Secondary | ICD-10-CM | POA: Diagnosis not present

## 2020-10-14 DIAGNOSIS — I1311 Hypertensive heart and chronic kidney disease without heart failure, with stage 5 chronic kidney disease, or end stage renal disease: Secondary | ICD-10-CM | POA: Diagnosis not present

## 2020-10-14 LAB — CBC WITH DIFFERENTIAL/PLATELET
Abs Immature Granulocytes: 0.02 10*3/uL (ref 0.00–0.07)
Basophils Absolute: 0 10*3/uL (ref 0.0–0.1)
Basophils Relative: 0 %
Eosinophils Absolute: 0 10*3/uL (ref 0.0–0.5)
Eosinophils Relative: 0 %
HCT: 31.9 % — ABNORMAL LOW (ref 39.0–52.0)
Hemoglobin: 9.9 g/dL — ABNORMAL LOW (ref 13.0–17.0)
Immature Granulocytes: 1 %
Lymphocytes Relative: 23 %
Lymphs Abs: 0.9 10*3/uL (ref 0.7–4.0)
MCH: 31.6 pg (ref 26.0–34.0)
MCHC: 31 g/dL (ref 30.0–36.0)
MCV: 101.9 fL — ABNORMAL HIGH (ref 80.0–100.0)
Monocytes Absolute: 0.3 10*3/uL (ref 0.1–1.0)
Monocytes Relative: 7 %
Neutro Abs: 2.8 10*3/uL (ref 1.7–7.7)
Neutrophils Relative %: 69 %
Platelets: 173 10*3/uL (ref 150–400)
RBC: 3.13 MIL/uL — ABNORMAL LOW (ref 4.22–5.81)
RDW: 14.5 % (ref 11.5–15.5)
WBC: 4 10*3/uL (ref 4.0–10.5)
nRBC: 0 % (ref 0.0–0.2)

## 2020-10-14 LAB — COMPREHENSIVE METABOLIC PANEL
ALT: 24 U/L (ref 0–44)
AST: 46 U/L — ABNORMAL HIGH (ref 15–41)
Albumin: 2.2 g/dL — ABNORMAL LOW (ref 3.5–5.0)
Alkaline Phosphatase: 116 U/L (ref 38–126)
Anion gap: 13 (ref 5–15)
BUN: 19 mg/dL (ref 8–23)
CO2: 22 mmol/L (ref 22–32)
Calcium: 8.1 mg/dL — ABNORMAL LOW (ref 8.9–10.3)
Chloride: 100 mmol/L (ref 98–111)
Creatinine, Ser: 4.69 mg/dL — ABNORMAL HIGH (ref 0.61–1.24)
GFR, Estimated: 13 mL/min — ABNORMAL LOW (ref >60)
Glucose, Bld: 196 mg/dL — ABNORMAL HIGH (ref 70–99)
Potassium: 4.6 mmol/L (ref 3.5–5.1)
Sodium: 135 mmol/L (ref 135–145)
Total Bilirubin: 0.6 mg/dL (ref 0.3–1.2)
Total Protein: 6.5 g/dL (ref 6.5–8.1)

## 2020-10-14 LAB — CBG MONITORING, ED
Glucose-Capillary: 307 mg/dL — ABNORMAL HIGH (ref 70–99)
Glucose-Capillary: 170 mg/dL — ABNORMAL HIGH (ref 70–99)
Glucose-Capillary: 247 mg/dL — ABNORMAL HIGH (ref 70–99)
Glucose-Capillary: 140 mg/dL — ABNORMAL HIGH (ref 70–99)

## 2020-10-14 LAB — ECHOCARDIOGRAM LIMITED
Height: 64 in
S' Lateral: 3.3 cm
Weight: 2504.43 oz

## 2020-10-14 LAB — MAGNESIUM: Magnesium: 1.6 mg/dL — ABNORMAL LOW (ref 1.7–2.4)

## 2020-10-14 LAB — D-DIMER, QUANTITATIVE: D-Dimer, Quant: 2.34 ug/mL-FEU — ABNORMAL HIGH (ref 0.00–0.50)

## 2020-10-14 LAB — C-REACTIVE PROTEIN: CRP: 0.8 mg/dL (ref <1.0)

## 2020-10-14 LAB — HEMOGLOBIN A1C
Hgb A1c MFr Bld: 5.3 % (ref 4.8–5.6)
Mean Plasma Glucose: 105.41 mg/dL

## 2020-10-14 LAB — GLUCOSE, CAPILLARY: Glucose-Capillary: 232 mg/dL — ABNORMAL HIGH (ref 70–99)

## 2020-10-14 MED ORDER — AMLODIPINE BESYLATE 5 MG PO TABS
5.0000 mg | ORAL_TABLET | Freq: Every day | ORAL | Status: DC
Start: 2020-10-14 — End: 2020-10-16
  Administered 2020-10-14 – 2020-10-16 (×3): 5 mg via ORAL
  Filled 2020-10-14 (×3): qty 1

## 2020-10-14 MED ORDER — MAGNESIUM SULFATE 2 GM/50ML IV SOLN
2.0000 g | Freq: Once | INTRAVENOUS | Status: AC
  Administered 2020-10-14: 2 g via INTRAVENOUS
  Filled 2020-10-14: qty 50

## 2020-10-14 MED ORDER — METOPROLOL TARTRATE 12.5 MG HALF TABLET
12.5000 mg | ORAL_TABLET | Freq: Two times a day (BID) | ORAL | Status: DC
  Administered 2020-10-14 – 2020-10-16 (×5): 12.5 mg via ORAL
  Filled 2020-10-14 (×5): qty 1

## 2020-10-14 MED ORDER — DEXAMETHASONE SODIUM PHOSPHATE 10 MG/ML IJ SOLN
6.0000 mg | INTRAMUSCULAR | Status: DC
  Filled 2020-10-14: qty 1

## 2020-10-14 MED ORDER — VANCOMYCIN HCL IN DEXTROSE 750-5 MG/150ML-% IV SOLN: 750.0000 mg | INTRAVENOUS | Status: DC

## 2020-10-14 MED ORDER — AZATHIOPRINE 50 MG PO TABS
50.0000 mg | ORAL_TABLET | Freq: Every day | ORAL | Status: DC
  Administered 2020-10-14 – 2020-10-16 (×3): 50 mg via ORAL
  Filled 2020-10-14 (×3): qty 1

## 2020-10-14 MED ORDER — PANTOPRAZOLE SODIUM 40 MG PO TBEC
40.0000 mg | DELAYED_RELEASE_TABLET | Freq: Two times a day (BID) | ORAL | Status: DC
  Administered 2020-10-14 – 2020-10-16 (×5): 40 mg via ORAL
  Filled 2020-10-14 (×5): qty 1

## 2020-10-14 MED ORDER — ATORVASTATIN CALCIUM 10 MG PO TABS
20.0000 mg | ORAL_TABLET | Freq: Every day | ORAL | Status: DC
  Administered 2020-10-14 – 2020-10-16 (×3): 20 mg via ORAL
  Filled 2020-10-14 (×3): qty 2

## 2020-10-14 MED ORDER — CHLORHEXIDINE GLUCONATE CLOTH 2 % EX PADS
6.0000 | MEDICATED_PAD | Freq: Every day | CUTANEOUS | Status: DC
  Administered 2020-10-15 – 2020-10-16 (×2): 6 via TOPICAL

## 2020-10-14 MED ORDER — CALCITRIOL 0.25 MCG PO CAPS
0.2500 ug | ORAL_CAPSULE | Freq: Every day | ORAL | Status: DC
  Filled 2020-10-14: qty 1

## 2020-10-14 MED ORDER — TERAZOSIN HCL 1 MG PO CAPS
1.0000 mg | ORAL_CAPSULE | Freq: Every day | ORAL | Status: DC
  Administered 2020-10-14 – 2020-10-16 (×3): 1 mg via ORAL
  Filled 2020-10-14 (×3): qty 1

## 2020-10-14 MED ORDER — CALCITRIOL 0.25 MCG PO CAPS: 0.5000 ug | ORAL_CAPSULE | ORAL | Status: DC

## 2020-10-14 MED ORDER — SODIUM CHLORIDE 0.9 % IV SOLN: 2.0000 g | INTRAVENOUS | Status: DC

## 2020-10-14 MED ORDER — SODIUM CHLORIDE 0.9 % IV SOLN
2.0000 g | INTRAVENOUS | Status: AC
  Administered 2020-10-14: 2 g via INTRAVENOUS
  Filled 2020-10-14: qty 2

## 2020-10-14 MED ORDER — LEVOTHYROXINE SODIUM 50 MCG PO TABS
50.0000 ug | ORAL_TABLET | Freq: Every day | ORAL | Status: DC
  Administered 2020-10-14 – 2020-10-16 (×3): 50 ug via ORAL
  Filled 2020-10-14 (×3): qty 1

## 2020-10-14 MED ORDER — UMECLIDINIUM BROMIDE 62.5 MCG/INH IN AEPB
1.0000 | INHALATION_SPRAY | Freq: Every day | RESPIRATORY_TRACT | Status: DC
  Administered 2020-10-14 – 2020-10-16 (×3): 1 via RESPIRATORY_TRACT
  Filled 2020-10-14: qty 7

## 2020-10-14 MED ORDER — VANCOMYCIN HCL 1500 MG/300ML IV SOLN
1500.0000 mg | INTRAVENOUS | Status: AC
  Administered 2020-10-14: 1500 mg via INTRAVENOUS
  Filled 2020-10-14: qty 300

## 2020-10-14 MED ORDER — TAMSULOSIN HCL 0.4 MG PO CAPS
0.4000 mg | ORAL_CAPSULE | Freq: Every day | ORAL | Status: DC
Start: 2020-10-14 — End: 2020-10-16
  Administered 2020-10-14 – 2020-10-16 (×3): 0.4 mg via ORAL
  Filled 2020-10-14 (×3): qty 1

## 2020-10-14 MED ORDER — FERROUS SULFATE 325 (65 FE) MG PO TABS
325.0000 mg | ORAL_TABLET | ORAL | Status: DC
  Administered 2020-10-15: 325 mg via ORAL
  Filled 2020-10-14 (×2): qty 1

## 2020-10-14 NOTE — Progress Notes (Addendum)
PROGRESS NOTE                                                                                                                                                                                                             Patient Demographics:    Victor Schultz, is a 70 y.o. male, DOB - 04-24-1951, DSK:[id number]  Outpatient Primary MD for the patient is Lillard Anes, MD   Admit date - 10/13/2020   LOS - 0  Chief Complaint  Patient presents with  . Shortness of Breath       Brief Narrative: Patient is a 70 y.o. male with PMHx of failed kidney transplant x2 (1974, 1982) now ESRD on HD TTS, DM-2, HTN, HLD, CAD, hypothyroidism, chronic hepatitis C-2-day history of fever, cough and generalized weakness-patient found to have COVID-19 infection-and subsequently admitted to the hospitalist service.   COVID-19 vaccinated status: Vaccinated including booster ( around 4 weeks back).  Significant Events: 1/7>> Admit to [facility] for COVID-19 infection  Significant studies: 1/7>> CTA chest: No PE-no lung infiltrates.  COVID-19 medications: Steroids: 1/7>> Remdesivir: 1/7>>  Antibiotics: Cefepime: 1/7>> 1/8 Vancomycin: 1/7 x 1  Microbiology data: 1/7>>Blood culture: No growth  Procedures: None  Consults: None  DVT prophylaxis: heparin injection 5,000 Units Start: 10/14/20 0000   Subjective:    [name] today he is doing much better-he was on room air this morning.  Continues to have coughing spells.   Assessment  & Plan :   COVID-19 infection: Not hypoxic-CT chest without infiltrates.  Suspect does not require steroids-we will plan on 3 days of IV Remdesivir given high risk comorbidities-note monoclonal antibody not available due to national shortage.   Note-no evidence of sepsis on admission-sepsis ruled out.  Procalcitonin only minimally elevated not surprising given ESRD-does not require vancomycin and  cefepime-we will discontinue.  Fever: afebrile this am O2 requirements:  SpO2: 98 %   COVID-19 Labs: Recent Labs    10/13/20 2216 10/14/20 0737  DDIMER  --  2.34*  FERRITIN 534*  --   LDH 168  --   CRP 0.6 0.8       Component Value Date/Time   BNP 390.3 (H) 10/13/2020 1848    Recent Labs  Lab 10/13/20 2216  PROCALCITON 0.69    Lab Results  Component Value Date   SARSCOV2NAA POSITIVE (A)  10/13/2020   SARSCOV2NAA NEGATIVE 05/20/2020   [place] NEGATIVE 05/05/2020   [place] NEGATIVE 12/22/2019     Prone/Incentive Spirometry: encouraged  incentive spirometry use 3-4/hour.  Minimally elevated troponin: No anginal symptoms-suspect either a false positive elevation due to ESRD or mild demand ischemia.  EKG without acute changes-await echo-but doubt further work-up is required.  Hypomagnesemia: We will replete and recheck.  Mildly prolonged QTC: Repeat EKG in a.m.-monitor K/Mg.  History of failed kidney transplant x2: Now ESRD-appears to be still taking azathioprine-discussed with nephrology whether we should still continue-we will await further recommendations from nephrology.  ESRD on HD TTS: Nephrology consulted this morning.  Await further recommendations.  DM-2 (A1c 5.3 on 1/8) with uncontrolled hyperglycemia due to steroids: Continue SSI-suspect now that steroids discontinued-CBGs will improve.   Recent Labs    10/14/20 0145 10/14/20 0821  GLUCAP 307* 170*    HTN: BP stable-resume amlodipine, Terazosin and metoprolol  Hypothyroidism: Continue Synthroid  COPD: Not wheezing-continue inhalers  BPH: Resume Flomax  Lobulated left lower lobe pulmonary nodule: Followed by Dr. Hinton Rao at the cancer center in [facility].  Will defer further work-up to the outpatient setting.  Debility/deconditioning: Lives alone-claims that he could not even get out of bed-suspect that significant worsening of physical abilities due to COVID-19 infection.  Needs  PT/OT eval to determine safe disposition.   GI prophylaxis: PPI  ABG:    Component Value Date/Time   HCO3 20.0 05/24/2020 0822   TCO2 21 (L) 05/24/2020 0822   ACIDBASEDEF 6.0 (H) 05/24/2020 0822   O2SAT 69.0 05/24/2020 0822    Vent Settings: N/A  Condition - Stable  Family Communication  :  Stepmother Sevan Mcbroom updated over the phone 1/8  Code Status :  Full Code  Diet :  Diet Order            Diet renal/carb modified with fluid restriction Diet-HS Snack? Nothing; Fluid restriction: 1200 mL Fluid; Room service appropriate? Yes; Fluid consistency: Thin  Diet effective now                  Disposition Plan  :   Status is: Inpatient  Remains inpatient appropriate because:Inpatient level of care appropriate due to severity of illness   Dispo: The patient is from: Home              Anticipated d/c is to: TBD              Anticipated d/c date is: 2 days              Patient currently is not medically stable to d/c.    Barriers to discharge: COVID-19 infection-on IV Remdesivir x3 days-severe debility/weakness due to COVID-19 infection-await PT eval to determine safe disposition.  Antimicorbials  :    Anti-infectives (From admission, onward)   Start     Dose/Rate Route Frequency Ordered Stop   10/16/20 1200  vancomycin (VANCOCIN) IVPB 750 mg/150 ml premix        750 mg 150 mL/hr over 60 Minutes Intravenous Every M-W-F (Hemodialysis) 10/14/20 0320     10/16/20 1200  ceFEPIme (MAXIPIME) 2 g in sodium chloride 0.9 % 100 mL IVPB        2 g 200 mL/hr over 30 Minutes Intravenous Every M-W-F (Hemodialysis) 10/14/20 0320     10/15/20 1000  remdesivir 100 mg in sodium chloride 0.9 % 100 mL IVPB       "Followed by" Linked Group Details   100 mg 200  mL/hr over 30 Minutes Intravenous Daily 10/13/20 2346 10/19/20 0959   10/14/20 0330  ceFEPIme (MAXIPIME) 2 g in sodium chloride 0.9 % 100 mL IVPB        2 g 200 mL/hr over 30 Minutes Intravenous NOW 10/14/20 0320 10/14/20 0647    10/14/20 0330  vancomycin (VANCOREADY) IVPB 1500 mg/300 mL        1,500 mg 150 mL/hr over 120 Minutes Intravenous NOW 10/14/20 0320 10/14/20 0608   10/14/20 0030  remdesivir 200 mg in sodium chloride 0.9% 250 mL IVPB       "Followed by" Linked Group Details   200 mg 580 mL/hr over 30 Minutes Intravenous Once 10/13/20 2346 10/14/20 0313      Inpatient Medications  Scheduled Meds: . vitamin C  500 mg Oral Daily  . cholecalciferol  1,000 Units Oral Daily  . dexamethasone (DECADRON) injection  6 mg Intravenous Q24H  . heparin  5,000 Units Subcutaneous Q8H  . insulin aspart  0-15 Units Subcutaneous TID WC  . insulin aspart  0-5 Units Subcutaneous QHS  . zinc sulfate  220 mg Oral Daily   Continuous Infusions: . [START ON 10/16/2020] ceFEPime (MAXIPIME) IV    . [START ON 10/15/2020] remdesivir 100 mg in NS 100 mL    . [START ON 10/16/2020] vancomycin     PRN Meds:.acetaminophen, albuterol, benzonatate   Time Spent in minutes  25   See all Orders from today for further details   Oren Binet M.D on 10/14/2020 at 10:34 AM  To page go to [email or web address] - use universal password  [facility] -  Office  [phone]    Objective:   Vitals:   10/14/20 0655 10/14/20 0700 10/14/20 0715 10/14/20 0830  BP:  (!) 144/79 (!) 145/78 (!) 150/82  Pulse:  (!) 106 (!) 103 (!) 107  Resp:  18 (!) 25 (!) 21  Temp: 98.7 F (37.1 C)     TempSrc: Oral     SpO2:  100% 98% 98%  Weight:      Height:        Wt Readings from Last 3 Encounters:  10/13/20 71 kg  09/22/20 70.7 kg  09/06/20 73.9 kg     Intake/Output Summary (Last 24 hours) at 10/14/2020 1034 Last data filed at 10/14/2020 0313 Gross per 24 hour  Intake 750 ml  Output -  Net 750 ml     Physical Exam Gen Exam:Alert awake-not in any distress HEENT:atraumatic, normocephalic Chest: B/L clear to auscultation anteriorly CVS:S1S2 regular Abdomen:soft non tender, non distended Extremities:no edema Neurology: Non  focal Skin: no rash   Data Review:    CBC Recent Labs  Lab 10/13/20 1847 10/14/20 0737  WBC 5.3 4.0  HGB 9.0* 9.9*  HCT 28.8* 31.9*  PLT 169 173  MCV 102.1* 101.9*  MCH 31.9 31.6  MCHC 31.3 31.0  RDW 15.0 14.5  LYMPHSABS 1.8 0.9  MONOABS 1.1* 0.3  EOSABS 0.0 0.0  BASOSABS 0.0 0.0    Chemistries  Recent Labs  Lab 10/13/20 1847 10/14/20 0737  NA 138 135  K 3.9 4.6  CL 101 100  CO2 25 22  GLUCOSE 87 196*  BUN 14 19  CREATININE 4.52* 4.69*  CALCIUM 8.1* 8.1*  MG  --  1.6*  AST  --  46*  ALT  --  24  ALKPHOS  --  116  BILITOT  --  0.6   ------------------------------------------------------------------------------------------------------------------ Recent Labs    10/13/20 2218  TRIG 74  Lab Results  Component Value Date   HGBA1C 5.3 10/14/2020   ------------------------------------------------------------------------------------------------------------------ No results for input(s): TSH, T4TOTAL, T3FREE, THYROIDAB in the last 72 hours.  Invalid input(s): FREET3 ------------------------------------------------------------------------------------------------------------------ Recent Labs    10/13/20 2216  FERRITIN 534*    Coagulation profile No results for input(s): INR, PROTIME in the last 168 hours.  Recent Labs    10/14/20 0737  DDIMER 2.34*    Cardiac Enzymes No results for input(s): CKMB, TROPONINI, MYOGLOBIN in the last 168 hours.  Invalid input(s): CK ------------------------------------------------------------------------------------------------------------------    Component Value Date/Time   BNP 390.3 (H) 10/13/2020 1848    Micro Results Recent Results (from the past 240 hour(s))  Resp Panel by RT-PCR (Flu A&B, Covid) Nasopharyngeal Swab     Status: Abnormal   Collection Time: 10/13/20  6:58 PM   Specimen: Nasopharyngeal Swab; Nasopharyngeal(NP) swabs in vial transport medium  Result Value Ref Range Status   SARS  Coronavirus 2 by RT PCR POSITIVE (A) NEGATIVE Final    Comment: RESULT CALLED TO, READ BACK BY AND VERIFIED WITH: A OLEARY RN 2104 10/13/20 A BROWNING (NOTE) SARS-CoV-2 target nucleic acids are DETECTED.  The SARS-CoV-2 RNA is generally detectable in upper respiratory specimens during the acute phase of infection. Positive results are indicative of the presence of the identified virus, but do not rule out bacterial infection or co-infection with other pathogens not detected by the test. Clinical correlation with patient history and other diagnostic information is necessary to determine patient infection status. The expected result is Negative.  Fact Sheet for Patients: EntrepreneurPulse.com.au  Fact Sheet for Healthcare Providers: IncredibleEmployment.be  This test is not yet approved or cleared by the [place] FDA and  has been authorized for detection and/or diagnosis of SARS-CoV-2 by FDA under an Emergency Use Authorization (EUA).  This EUA will remain in effect (meaning this test can b e used) for the duration of  the COVID-19 declaration under Section 564(b)(1) of the Act, 21 U.S.C. section 360bbb-3(b)(1), unless the authorization is terminated or revoked sooner.     Influenza A by PCR NEGATIVE NEGATIVE Final   Influenza B by PCR NEGATIVE NEGATIVE Final    Comment: (NOTE) The Xpert Xpress SARS-CoV-2/FLU/RSV plus assay is intended as an aid in the diagnosis of influenza from Nasopharyngeal swab specimens and should not be used as a sole basis for treatment. Nasal washings and aspirates are unacceptable for Xpert Xpress SARS-CoV-2/FLU/RSV testing.  Fact Sheet for Patients: EntrepreneurPulse.com.au  Fact Sheet for Healthcare Providers: IncredibleEmployment.be  This test is not yet approved or cleared by the [place] FDA and has been authorized for detection and/or diagnosis of SARS-CoV-2  by FDA under an Emergency Use Authorization (EUA). This EUA will remain in effect (meaning this test can be used) for the duration of the COVID-19 declaration under Section 564(b)(1) of the Act, 21 U.S.C. section 360bbb-3(b)(1), unless the authorization is terminated or revoked.  Performed at [facility], [place] [address], [place], [place] [address]   Culture, blood (routine x 2)     Status: None (Preliminary result)   Collection Time: 10/13/20  7:22 PM   Specimen: BLOOD  Result Value Ref Range Status   Specimen Description BLOOD BLOOD LEFT FOREARM  Final   Special Requests   Final    BOTTLES DRAWN AEROBIC AND ANAEROBIC Blood Culture adequate volume   Culture   Final    NO GROWTH < 12 HOURS Performed at [facility], [place] [address] [place], [place]  [address]    Report Status PENDING  Incomplete  Culture, blood (routine x 2)     Status: None (Preliminary result)   Collection Time: 10/13/20  8:03 PM   Specimen: BLOOD  Result Value Ref Range Status   Specimen Description BLOOD RIGHT ANTECUBITAL  Final   Special Requests   Final    BOTTLES DRAWN AEROBIC AND ANAEROBIC Blood Culture results may not be optimal due to an inadequate volume of blood received in culture bottles   Culture   Final    NO GROWTH < 12 HOURS Performed at [facility], [place] [address], [place], [place] [address]    Report Status PENDING  Incomplete    Radiology Reports CT Angio Chest PE W/Cm &/Or Wo Cm  Result Date: 10/13/2020 CLINICAL DATA:  PE suspected, high prob Shortness of breath and weakness. EXAM: CT ANGIOGRAPHY CHEST WITH CONTRAST TECHNIQUE: Multidetector CT imaging of the chest was performed using the standard protocol during bolus administration of intravenous contrast. Multiplanar CT image reconstructions and MIPs were obtained to evaluate the vascular anatomy. CONTRAST:  32mL OMNIPAQUE IOHEXOL 350 MG/ML SOLN COMPARISON:  PET CT 09/07/2020. FINDINGS: Cardiovascular: There are no  filling defects within the pulmonary arteries to suggest pulmonary embolus. Right-sided dialysis catheter remains in place. Atherosclerosis of the thoracic aorta with tortuosity. No aortic dissection or acute aortic findings. Multi chamber cardiomegaly. Post CABG with dense calcification of native coronary arteries. No pericardial effusion. Mediastinum/Nodes: No enlarged mediastinal or hilar lymph nodes. Small hiatal hernia. Small amount of low-density fluid adjacent to the distal esophagus likely residual tracking ascites, improved from prior exam. No visualized thyroid nodule. Lungs/Pleura: Improved right pleural effusion from prior exam. Trace left pleural effusion is unchanged. Anterior left lower lobe pulmonary nodule measures 2.1 x 1.4 cm, unchanged from recent PET. Subpleural reticulation and areas of honeycombing are unchanged from prior exam. No acute airspace disease. Scattered calcified granuloma. Upper Abdomen: Small volume abdominal ascites. No acute upper abdominal findings. Musculoskeletal: Median sternotomy. Faint sclerotic density within T10 is unchanged from prior imaging, not hypermetabolic on prior PET. Mild chronic loss of height of upper thoracic vertebra. No acute osseous abnormalities are seen. Review of the MIP images confirms the above findings. IMPRESSION: 1. No pulmonary embolus or acute intrathoracic abnormality. 2. Improved right pleural effusion from prior exam. Trace left pleural effusion is unchanged. 3. Unchanged left lower lobe pulmonary nodule. 4. Grossly stable interstitial lung disease allowing for motion on the current exam. Aortic Atherosclerosis (ICD10-I70.0). Electronically Signed   By: Keith Rake M.D.   On: 10/13/2020 21:43   DG Chest Port 1 View  Result Date: 10/13/2020 CLINICAL DATA:  Shortness of breath and weakness. EXAM: PORTABLE CHEST 1 VIEW COMPARISON:  07/07/2020 FINDINGS: Heart size is stable but enlarged. The patient is status post prior median  sternotomy. There is a well-positioned tunneled dialysis catheter on the right. There is stable bibasilar atelectasis and scarring. There is no pneumothorax. No large pleural effusion. No acute osseous abnormality. IMPRESSION: No acute abnormality. Electronically Signed   By: Constance Holster M.D.   On: 10/13/2020 19:19

## 2020-10-14 NOTE — ED Notes (Signed)
Patient CBG was 170.

## 2020-10-14 NOTE — ED Notes (Signed)
+  Tele; Breakfast Order placed

## 2020-10-14 NOTE — Progress Notes (Signed)
  Echocardiogram 2D Echocardiogram has been performed.  Victor Schultz 10/14/2020, 1:28 PM

## 2020-10-14 NOTE — Progress Notes (Signed)
Pharmacy Antibiotic Note  Victor Schultz is a 70 y.o. male admitted on 10/13/2020 with sepsis.  Pharmacy has been consulted for Vancomycin and Cefepime dosing. Pt is HD pt - usual HD days M/W/F.  Plan: Cefepime 2gm IV qHD Vancomycin 1500 mg IV now then 750mg  qHD.  Will f/u HD schedule/tolerance, micro data, and pt's clinical condition Vanc levels prn   Height: 5\' 4"  (162.6 cm) Weight: 71 kg (156 lb 8.4 oz) IBW/kg (Calculated) : 59.2  Temp (24hrs), Avg:99.2 F (37.3 C), Min:98.9 F (37.2 C), Max:99.4 F (37.4 C)  Recent Labs  Lab 10/13/20 1847 10/13/20 1848 10/13/20 2111  WBC 5.3  --   --   CREATININE 4.52*  --   --   LATICACIDVEN  --  1.4 0.9    Estimated Creatinine Clearance: 12.7 mL/min (A) (by C-G formula based on SCr of 4.52 mg/dL (H)).    Allergies  Allergen Reactions  . Methoxy Polyethylene Glycol-Epoetin Beta Other (See Comments)    Antimicrobials this admission: 1/8 Vanc >>  1/8 Cefepime >>  1/8 Remdesivir >>1/12  Microbiology results: 1/7 BCx:   Thank you for allowing pharmacy to be a part of this patient's care.  Sherlon Handing, PharmD, BCPS Please see amion for complete clinical pharmacist phone list 10/14/2020 3:14 AM

## 2020-10-14 NOTE — ED Notes (Signed)
Attempted to call report

## 2020-10-14 NOTE — Plan of Care (Signed)
  Problem: Education: Goal: Knowledge of risk factors and measures for prevention of condition will improve Outcome: Progressing   Problem: Respiratory: Goal: Will maintain a patent airway Outcome: Progressing   Problem: Education: Goal: Knowledge of General Education information will improve Description: Including pain rating scale, medication(s)/side effects and non-pharmacologic comfort measures Outcome: Progressing   Problem: Health Behavior/Discharge Planning: Goal: Ability to manage health-related needs will improve Outcome: Progressing   Problem: Clinical Measurements: Goal: Respiratory complications will improve Outcome: Progressing   Problem: Skin Integrity: Goal: Risk for impaired skin integrity will decrease Outcome: Progressing

## 2020-10-14 NOTE — Consult Note (Signed)
ESRD Consult Note  Requesting provider: Oren Binet Service requesting consult: Hospitalist Reason for consult: ESRD, provision of dialysis Indication for acute dialysis?: End Stage Renal Disease  Outpatient dialysis unit: [facility] schedule: TTS  Assessment/Recommendations: Victor Schultz is a/an 70 y.o. male with a past medical history notable for ESRD on HD admitted with COVID.   # ESRD: Plan to hold dialysis due to scheduling issues and no emergent issue. Lower to 3 hours. Home 3.5hrs, 3K, 2.25Ca, Na 137, Bicarbonate 35, Dialyzer: 180 # Volume/ hypertension: EDW 71kg. Attempt to achieve EDW as tolerated. Cont home htn meds # Anemia of Chronic Kidney Disease: Hemoglobin 9.9. Currently not receiving ESA. CTM.  # Secondary Hyperparathyroidism/Hyperphosphatemia: cont home sevelamer. Calcitriol 0.71mg 3x weekly # Vascular access: CVC without issues #COVID: management per primary #History of kidney transplant: now failed on HD. Lower aza to 594mdaily. Can hold if COVID symptoms worsen  # Additional recommendations: - Dose all meds for creatinine clearance < 10 ml/min  - Unless absolutely necessary, no MRIs with gadolinium.  - Implement save arm precautions.  Prefer needle sticks in the dorsum of the hands or wrists.  No blood pressure measurements in arm. - If blood transfusion is requested during hemodialysis sessions, please alert [facility] to the session.   Recommendations were discussed with the primary team.   History of Present Illness: [name] a/an [age].o. male with a past medical history of ESRD who presents with fever and weakness.  Patient presented to the hospital today with cough, fever, shortness of breath, weakness.  He lives at home alone and undergoes dialysis regularly on TTS in [facility].  Patient is fully vaccinated including booster.  Due to the patient's symptoms today he was sent to the ER for further evaluation.   The patient also has some nausea.  Shortness of breath is minimal and not requiring oxygen at this time.  In the emergency department he was noted to be mildly tachypneic.  Other vital signs were reassuring.  Electrolytes appeared within normal limits.  Chest x-ray also reassuring.  CT angiogram negative for PE.  Patient was admitted for further management of Covid.  Patient has a history of kidney transplant.  He has been back on dialysis since October.  He has continued azathioprine during this time.  Medications:  Current Facility-Administered Medications  Medication Dose Route Frequency Provider Last Rate Last Admin  . acetaminophen (TYLENOL) tablet 650 mg  650 mg Oral Q6H PRN RaShela LeffMD      . albuterol (VENTOLIN HFA) 108 (90 Base) MCG/ACT inhaler 2 puff  2 puff Inhalation Q4H PRN RaShela LeffMD      . amLODipine (NORVASC) tablet 5 mg  5 mg Oral Daily Ghimire, ShHenreitta LeberMD   5 mg at 10/14/20 1112  . ascorbic acid (VITAMIN C) tablet 500 mg  500 mg Oral Daily RaShela LeffMD   500 mg at 10/14/20 1106  . atorvastatin (LIPITOR) tablet 20 mg  20 mg Oral Daily GhJonetta OsgoodMD   20 mg at 10/14/20 1111  . azaTHIOprine (IMURAN) tablet 50 mg  50 mg Oral Daily PeReesa ChewMD      . benzonatate (TESSALON) capsule 100 mg  100 mg Oral TID PRN RaShela LeffMD      . calcitRIOL (ROCALTROL) capsule 0.25 mcg  0.25 mcg Oral Daily Ghimire, ShHenreitta LeberMD      . Chlorhexidine Gluconate Cloth 2 % PADS 6 each  6 each Topical Q0600 Reesa Chew, MD      . cholecalciferol (VITAMIN D3) tablet 1,000 Units  1,000 Units Oral Daily Shela Leff, MD   1,000 Units at 10/14/20 1106  . [START ON 10/15/2020] ferrous sulfate tablet 325 mg  325 mg Oral Q Sun Ghimire, Shanker M, MD      . heparin injection 5,000 Units  5,000 Units Subcutaneous Q8H Shela Leff, MD   5,000 Units at 10/14/20 1424  . insulin aspart (novoLOG) injection 0-15 Units  0-15 Units  Subcutaneous TID WC Shela Leff, MD   5 Units at 10/14/20 1309  . insulin aspart (novoLOG) injection 0-5 Units  0-5 Units Subcutaneous QHS Shela Leff, MD   4 Units at 10/14/20 0148  . levothyroxine (SYNTHROID) tablet 50 mcg  50 mcg Oral Daily Jonetta Osgood, MD   50 mcg at 10/14/20 1117  . metoprolol tartrate (LOPRESSOR) tablet 12.5 mg  12.5 mg Oral BID Jonetta Osgood, MD   12.5 mg at 10/14/20 1116  . pantoprazole (PROTONIX) EC tablet 40 mg  40 mg Oral BID Jonetta Osgood, MD   40 mg at 10/14/20 1116  . [START ON 10/15/2020] remdesivir 100 mg in sodium chloride 0.9 % 100 mL IVPB  100 mg Intravenous Daily Ghimire, Shanker M, MD      . tamsulosin (FLOMAX) capsule 0.4 mg  0.4 mg Oral Daily Jonetta Osgood, MD   0.4 mg at 10/14/20 1110  . terazosin (HYTRIN) capsule 1 mg  1 mg Oral Daily Ghimire, Shanker M, MD   1 mg at 10/14/20 1319  . umeclidinium bromide (INCRUSE ELLIPTA) 62.5 MCG/INH 1 puff  1 puff Inhalation Daily Jonetta Osgood, MD   1 puff at 10/14/20 1423  . zinc sulfate capsule 220 mg  220 mg Oral Daily Shela Leff, MD   220 mg at 10/14/20 1106   Current Outpatient Medications  Medication Sig Dispense Refill  . albuterol (VENTOLIN HFA) 108 (90 Base) MCG/ACT inhaler Inhale 1-2 puffs into the lungs every 4 (four) hours as needed for wheezing or shortness of breath.    [name] amLODipine (NORVASC) 5 MG tablet Take 1 tablet (5 mg total) by mouth daily. 90 tablet 1  . atorvastatin (LIPITOR) 20 MG tablet Take 1 tablet (20 mg total) by mouth daily. 90 tablet 0  . azaTHIOprine (IMURAN) 50 MG tablet Take 1 tablet (50 mg total) by mouth in the morning and at bedtime. 1 1/2 tablet in the morning and 1 tablet in the evening (Patient taking differently: Take 50 mg by mouth See admin instructions. 1 1/2 tablet in the morning and 1 tablet in the evening) 270 tablet 0  . calcitRIOL (ROCALTROL) 0.25 MCG capsule Take 0.25 mcg by mouth daily.    . ferrous sulfate 325 (65 FE) MG  tablet Take 325 mg by mouth every Sunday.    . levothyroxine (SYNTHROID) 50 MCG tablet Take 1 tablet (50 mcg total) by mouth daily. 90 tablet 0  . metoprolol tartrate (LOPRESSOR) 25 MG tablet Take 0.5 tablets (12.5 mg total) by mouth 2 (two) times daily. 90 tablet 0  . pantoprazole (PROTONIX) 40 MG tablet Take 1 tablet (40 mg total) by mouth 2 (two) times daily. 60 tablet 2  . sevelamer (RENAGEL) 800 MG tablet Take 1 tablet (800 mg total) by mouth in the morning and at bedtime. 180 tablet 0  . sodium bicarbonate 650 MG tablet Take 1,300 mg by mouth 2 (two) times daily.    [name]  tamsulosin (FLOMAX) 0.4 MG CAPS capsule Take 1 capsule (0.4 mg total) by mouth daily. 90 capsule 1  . terazosin (HYTRIN) 1 MG capsule Take 1 capsule (1 mg total) by mouth daily. 90 capsule 0  . Vitamin D, Ergocalciferol, (DRISDOL) 1.25 MG (50000 UNIT) CAPS capsule Take 50,000 Units by mouth every Sunday.     . B-D UF III MINI PEN NEEDLES 31G X 5 MM MISC USE WITH LANTUS AS DIRECTED    . insulin glargine (LANTUS SOLOSTAR) 100 UNIT/ML Solostar Pen Inject 30 Units into the skin daily. (Patient not taking: No sig reported)    . LEVEMIR FLEXTOUCH 100 UNIT/ML FlexPen Inject into the skin. (Patient not taking: No sig reported)    . LOKELMA 10 g PACK packet Take by mouth. (Patient not taking: No sig reported)    . tiotropium (SPIRIVA HANDIHALER) 18 MCG inhalation capsule Place into inhaler and inhale. (Patient not taking: No sig reported)       ALLERGIES Methoxy polyethylene glycol-epoetin beta  MEDICAL HISTORY Past Medical History:  Diagnosis Date  . Abnormal hemoglobin (Hgb) ([place]) 12/08/2019  . Actinic keratosis 04/15/2020  . Acute upper GI bleeding 12/22/2019  . Anemia 04/15/2020  . Atherosclerotic heart disease of native coronary artery with unstable angina pectoris ([place])   . CAD in native artery 02/08/2016  . Cancer ([place])    skin cancer on both arms.  . Chronic hepatitis ([place]) 04/05/2020  . Chronic kidney disease, stage 4  (severe) ([place]) 01/02/2020  . 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 Contact dermatitis and other eczema due to other chemical products 04/15/2020  . Disorder of mineral metabolism, unspecified 06/18/2020  . Dizziness 05/08/2020  . End stage renal disease ([place]) 04/15/2020  . Epigastric abdominal tenderness without rebound tenderness 01/02/2020  . Essential hypertension 12/22/2019  . Gastroesophageal reflux disease without esophagitis 12/06/2019  . Hypertensive heart and chronic kidney disease without heart failure, with stage 5 chronic kidney disease, or end stage renal disease ([place])    ESR,kidney transplant  . Hypertensive heart and renal disease 01/02/2020  . Hypothyroidism   . Kidney transplant status, cadaveric 07/08/2017   Formatting of this note might be different from the original. In the 1970s and again in 1982  . Long term (current) use of insulin ([place])   . Metabolic acidosis 0/93/2671  . Mixed hyperlipidemia   . Obstructive uropathy 06/18/2020  . Personal history of malignant neoplasm of skin 10/09/2010   SQUAMOUS CELL CARCINOMA- R AND L FOREARM, LEFT EAR SQUAMOUS CELL CARCINOMA- R AND L FOREARM, LEFT EAR  Formatting of this note might be different from the original. SQUAMOUS CELL CARCINOMA- R AND L FOREARM, LEFT EAR  . Presence of aortocoronary bypass graft 02/08/2016   Formatting of this note might be different from the original. in Oct of 2013 Formatting of this note might be different from the original. Formatting of this note might be different from the original. in Oct of 2013 Oct of 2013  Formatting of this note might be different from the original. Oct of 2013 Formatting of this note might be different from the original. in Oct of 2013 Formatting of this n  . Pure hypercholesterolemia 06/15/2020  . S/P CABG x 3 02/08/2016   in Oct of 2013 Oct of 2013  Formatting of this note might be different from the original. Oct of 2013  . Serum potassium elevated 12/08/2019  . Short of breath on  exertion 07/06/2020  . Squamous cell carcinoma of skin of ear  and external auditory canal 11/16/2010   SCC on left helix treated with Mohs surgery SCC on left helix treated with Mohs surgery  Formatting of this note might be different from the original. Overview:  SCC on left helix treated with Mohs surgery  . Type 2 diabetes mellitus with chronic kidney disease, without long-term current use of insulin ([place]) 12/06/2019  . Unspecified disorder of kidney and ureter 06/15/2020  . Vitamin D deficiency 02/08/2016   In Jan 2017, his vitamin D level was 11 In Jan 2017, his vitamin D level was 61  Formatting of this note might be different from the original. In Jan 2017, his vitamin D level was 11  . Weight gain 07/06/2020  . Wheezing 07/06/2020     SOCIAL HISTORY Social History   Socioeconomic History  . Marital status: Divorced    Spouse name: Not on file  . Number of children: 1  . Years of education: 12th grade  . Highest education level: Some college, no degree  Occupational History  . Occupation: retired  Tobacco Use  . Smoking status: Former Smoker    Packs/day: 0.50    Years: 39.00    Pack years: 19.50    Types: Cigarettes    Quit date: 07/13/2020    Years since quitting: 0.2  . Smokeless tobacco: Never Used  Vaping Use  . Vaping Use: Never used  Substance and Sexual Activity  . Alcohol use: Never  . Drug use: Never  . Sexual activity: Not Currently  Other Topics Concern  . Not on file  Social History Narrative  . Not on file   Social Determinants of Health   Financial Resource Strain: Not on file  Food Insecurity: No Food Insecurity  . Worried About Charity fundraiser in the Last Year: Never true  . Ran Out of Food in the Last Year: Never true  Transportation Needs: No Transportation Needs  . Lack of Transportation (Medical): No  . Lack of Transportation (Non-Medical): No  Physical Activity: Not on file  Stress: Not on file  Social Connections: Socially Isolated  .  Frequency of Communication with Friends and Family: More than three times a week  . Frequency of Social Gatherings with Friends and Family: Once a week  . Attends Religious Services: Never  . Active Member of Clubs or Organizations: No  . Attends Archivist Meetings: Never  . Marital Status: Divorced  Human resources officer Violence: Not on file     FAMILY HISTORY Family History  Problem Relation Age of Onset  . Liver disease Mother   . Alzheimer's disease Father      Review of Systems: 12 systems were reviewed and negative except per HPI  Physical Exam: Vitals:   10/14/20 1130 10/14/20 1319  BP: 139/84 133/66  Pulse: (!) 103   Resp: (!) 26   Temp:    SpO2: 97%    No intake/output data recorded.  Intake/Output Summary (Last 24 hours) at 10/14/2020 1428 Last data filed at 10/14/2020 0313 Gross per 24 hour  Intake 750 ml  Output --  Net 750 ml   General: well-appearing, no acute distress HEENT: anicteric sclera, MMM CV: normal rate, no murmurs, no edema Lungs: bilateral chest rise, normal wob Abd: soft, non-tender, non-distended Skin: no visible lesions or rashes Psych: alert, engaged, appropriate mood and affect Neuro: normal speech, no gross focal deficits   Test Results Reviewed Lab Results  Component Value Date   NA 135 10/14/2020   K  4.6 10/14/2020   CL 100 10/14/2020   CO2 22 10/14/2020   BUN 19 10/14/2020   CREATININE 4.69 (H) 10/14/2020   GLU 169 09/22/2020   CALCIUM 8.1 (L) 10/14/2020   ALBUMIN 2.2 (L) 10/14/2020    I have reviewed relevant outside healthcare records

## 2020-10-14 NOTE — ED Notes (Signed)
Patient CBG was 247.

## 2020-10-15 DIAGNOSIS — U071 COVID-19: Secondary | ICD-10-CM | POA: Diagnosis not present

## 2020-10-15 DIAGNOSIS — R9431 Abnormal electrocardiogram [ECG] [EKG]: Secondary | ICD-10-CM | POA: Diagnosis not present

## 2020-10-15 DIAGNOSIS — E1122 Type 2 diabetes mellitus with diabetic chronic kidney disease: Secondary | ICD-10-CM | POA: Diagnosis not present

## 2020-10-15 DIAGNOSIS — E039 Hypothyroidism, unspecified: Secondary | ICD-10-CM | POA: Diagnosis not present

## 2020-10-15 LAB — COMPREHENSIVE METABOLIC PANEL
ALT: 19 U/L (ref 0–44)
AST: 30 U/L (ref 15–41)
Albumin: 2.1 g/dL — ABNORMAL LOW (ref 3.5–5.0)
Alkaline Phosphatase: 100 U/L (ref 38–126)
Anion gap: 13 (ref 5–15)
BUN: 30 mg/dL — ABNORMAL HIGH (ref 8–23)
CO2: 21 mmol/L — ABNORMAL LOW (ref 22–32)
Calcium: 8.2 mg/dL — ABNORMAL LOW (ref 8.9–10.3)
Chloride: 104 mmol/L (ref 98–111)
Creatinine, Ser: 5.28 mg/dL — ABNORMAL HIGH (ref 0.61–1.24)
GFR, Estimated: 11 mL/min — ABNORMAL LOW (ref >60)
Glucose, Bld: 140 mg/dL — ABNORMAL HIGH (ref 70–99)
Potassium: 4.1 mmol/L (ref 3.5–5.1)
Sodium: 138 mmol/L (ref 135–145)
Total Bilirubin: 0.5 mg/dL (ref 0.3–1.2)
Total Protein: 6 g/dL — ABNORMAL LOW (ref 6.5–8.1)

## 2020-10-15 LAB — GLUCOSE, CAPILLARY
Glucose-Capillary: 135 mg/dL — ABNORMAL HIGH (ref 70–99)
Glucose-Capillary: 192 mg/dL — ABNORMAL HIGH (ref 70–99)
Glucose-Capillary: 143 mg/dL — ABNORMAL HIGH (ref 70–99)
Glucose-Capillary: 188 mg/dL — ABNORMAL HIGH (ref 70–99)

## 2020-10-15 LAB — CBC WITH DIFFERENTIAL/PLATELET
Abs Immature Granulocytes: 0.02 10*3/uL (ref 0.00–0.07)
Basophils Absolute: 0 10*3/uL (ref 0.0–0.1)
Basophils Relative: 0 %
Eosinophils Absolute: 0 10*3/uL (ref 0.0–0.5)
Eosinophils Relative: 0 %
HCT: 26.5 % — ABNORMAL LOW (ref 39.0–52.0)
Hemoglobin: 8.8 g/dL — ABNORMAL LOW (ref 13.0–17.0)
Immature Granulocytes: 0 %
Lymphocytes Relative: 23 %
Lymphs Abs: 1.2 10*3/uL (ref 0.7–4.0)
MCH: 32.7 pg (ref 26.0–34.0)
MCHC: 33.2 g/dL (ref 30.0–36.0)
MCV: 98.5 fL (ref 80.0–100.0)
Monocytes Absolute: 0.6 10*3/uL (ref 0.1–1.0)
Monocytes Relative: 12 %
Neutro Abs: 3.2 10*3/uL (ref 1.7–7.7)
Neutrophils Relative %: 65 %
Platelets: 182 10*3/uL (ref 150–400)
RBC: 2.69 MIL/uL — ABNORMAL LOW (ref 4.22–5.81)
RDW: 14.5 % (ref 11.5–15.5)
WBC: 5 10*3/uL (ref 4.0–10.5)
nRBC: 0 % (ref 0.0–0.2)

## 2020-10-15 LAB — MAGNESIUM: Magnesium: 2.1 mg/dL (ref 1.7–2.4)

## 2020-10-15 LAB — D-DIMER, QUANTITATIVE: D-Dimer, Quant: 1.36 ug/mL-FEU — ABNORMAL HIGH (ref 0.00–0.50)

## 2020-10-15 LAB — C-REACTIVE PROTEIN: CRP: 0.7 mg/dL (ref <1.0)

## 2020-10-15 MED ORDER — SODIUM CHLORIDE 0.9 % IV SOLN: 100.0000 mL | INTRAVENOUS | Status: DC | PRN

## 2020-10-15 MED ORDER — PENTAFLUOROPROP-TETRAFLUOROETH EX AERO: 1.0000 "application " | INHALATION_SPRAY | CUTANEOUS | Status: DC | PRN

## 2020-10-15 MED ORDER — HEPARIN SODIUM (PORCINE) 1000 UNIT/ML DIALYSIS
1000.0000 [IU] | INTRAMUSCULAR | Status: DC | PRN
Start: 2020-10-15 — End: 2020-10-16

## 2020-10-15 MED ORDER — LIDOCAINE HCL (PF) 1 % IJ SOLN: 5.0000 mL | INTRAMUSCULAR | Status: DC | PRN

## 2020-10-15 MED ORDER — ALTEPLASE 2 MG IJ SOLR: 2.0000 mg | Freq: Once | INTRAMUSCULAR | Status: DC | PRN

## 2020-10-15 MED ORDER — LIDOCAINE-PRILOCAINE 2.5-2.5 % EX CREA: 1.0000 "application " | TOPICAL_CREAM | CUTANEOUS | Status: DC | PRN

## 2020-10-15 NOTE — Progress Notes (Signed)
PROGRESS NOTE                                                                                                                                                                                                             Patient Demographics:    Victor Schultz, is a 70 y.o. male, DOB - 11-Sep-1951, XLK:[id number]  Outpatient Primary MD for the patient is Lillard Anes, MD   Admit date - 10/13/2020   LOS - 1  Chief Complaint  Patient presents with  . Shortness of Breath       Brief Narrative: Patient is a 70 y.o. male with PMHx of failed kidney transplant x2 (1974, 1982) now ESRD on HD TTS, DM-2, HTN, HLD, CAD, hypothyroidism, chronic hepatitis C-2-day history of fever, cough and generalized weakness-patient found to have COVID-19 infection-and subsequently admitted to the hospitalist service.   COVID-19 vaccinated status: Vaccinated including booster ( around 4 weeks back).  Significant Events: 1/7>> Admit to [facility] for COVID-19 infection  Significant studies: 1/7>> CTA chest: No PE-no lung infiltrates. 1/8>> Echo: EF 55-60%  COVID-19 medications: Steroids: 1/7>> Remdesivir: 1/7>>  Antibiotics: Cefepime: 1/7>> 1/8 Vancomycin: 1/7 x 1  Microbiology data: 1/7>>Blood culture: No growth  Procedures: None  Consults: None  DVT prophylaxis: heparin injection 5,000 Units Start: 10/14/20 0000   Subjective:   He feels better-some mild cough.  He ambulated well with physical therapy per patient this morning.   Assessment  & Plan :   COVID-19 infection: No evidence of pneumonia on imaging studies-not hypoxic-given high risk medical comorbidities-plan is for a total of 3 days of IV Remdesivir-note monoclonal antibody not available due to national shortage.  He continues to do well-if he remains stable-plan is to discharge him home on 1/10 after he finishes his last dose of Remdesivir.   Note-no evidence of  sepsis on admission-sepsis ruled out.  Procalcitonin only minimally elevated not surprising given ESRD-does not require vancomycin and cefepime-we will discontinue.  Fever: afebrile this am O2 requirements:  SpO2: 100 %   COVID-19 Labs: Recent Labs    10/13/20 2216 10/14/20 0737 10/15/20 0348  DDIMER  --  2.34* 1.36*  FERRITIN 534*  --   --   LDH 168  --   --   CRP 0.6 0.8 0.7       Component Value Date/Time  BNP 390.3 (H) 10/13/2020 1848    Recent Labs  Lab 10/13/20 2216  PROCALCITON 0.69    Lab Results  Component Value Date   SARSCOV2NAA POSITIVE (A) 10/13/2020   SARSCOV2NAA NEGATIVE 05/20/2020   [place] NEGATIVE 05/05/2020   [place] NEGATIVE 12/22/2019     Prone/Incentive Spirometry: encouraged  incentive spirometry use 3-4/hour.  Minimally elevated troponin: No anginal symptoms-suspect either a false positive elevation due to ESRD or mild demand ischemia.  EKG without acute changes-echo with preserved EF.  No further work-up required.  Hypomagnesemia: Repleted.  Mildly prolonged QTC: Resolved-after correction of hypomagnesemia.  History of failed kidney transplant x2: He is now ESRD-on azathioprine-nephrology has decreased dosage to 50 mg.    ESRD on HD TTS: Nephrology following-HD per nephrology.   DM-2 (A1c 5.3 on 1/8) with uncontrolled hyperglycemia due to steroids: Continue SSI-suspect now that steroids discontinued-CBGs will improve.   Recent Labs    10/14/20 2050 10/15/20 0740 10/15/20 1152  GLUCAP 232* 135* 192*    HTN: BP stable-resume amlodipine, Terazosin and metoprolol  Hypothyroidism: Continue Synthroid  COPD: Not wheezing-continue inhalers  BPH: Resume Flomax  Lobulated left lower lobe pulmonary nodule: Followed by Dr. Hinton Rao at the cancer center [facility].  Will defer further work-up to the outpatient setting.  Debility/deconditioning: Lives alone-claims that he could not even get out of bed-suspect that  significant worsening of physical abilities due to COVID-19 infection.  Appreciate PT evaluation-seems to have improved-no further recommendations from PT.   GI prophylaxis: PPI  ABG:    Component Value Date/Time   HCO3 20.0 05/24/2020 0822   TCO2 21 (L) 05/24/2020 0822   ACIDBASEDEF 6.0 (H) 05/24/2020 0822   O2SAT 69.0 05/24/2020 0822    Vent Settings: N/A  Condition - Stable  Family Communication  :  Stepmother Terrence Dupont Moen-[phone]- updated over the phone 1/8  Code Status :  Full Code  Diet :  Diet Order            Diet renal/carb modified with fluid restriction Diet-HS Snack? Nothing; Fluid restriction: 1200 mL Fluid; Room service appropriate? Yes; Fluid consistency: Thin  Diet effective now                  Disposition Plan  :   Status is: Inpatient  Remains inpatient appropriate because:Inpatient level of care appropriate due to severity of illness   Dispo: The patient is from: Home              Anticipated d/c is to: TBD              Anticipated d/c date is: 2 days              Patient currently is not medically stable to d/c.    Barriers to discharge: COVID-19 infection-on IV Remdesivir x3 days  Antimicorbials  :    Anti-infectives (From admission, onward)   Start     Dose/Rate Route Frequency Ordered Stop   10/16/20 1200  vancomycin (VANCOCIN) IVPB 750 mg/150 ml premix  Status:  Discontinued        750 mg 150 mL/hr over 60 Minutes Intravenous Every M-W-F (Hemodialysis) 10/14/20 0320 10/14/20 1042   10/16/20 1200  ceFEPIme (MAXIPIME) 2 g in sodium chloride 0.9 % 100 mL IVPB  Status:  Discontinued        2 g 200 mL/hr over 30 Minutes Intravenous Every M-W-F (Hemodialysis) 10/14/20 0320 10/14/20 1042   10/15/20 1000  remdesivir 100 mg in sodium chloride  0.9 % 100 mL IVPB       "Followed by" Linked Group Details   100 mg 200 mL/hr over 30 Minutes Intravenous Daily 10/13/20 2346 10/17/20 0959   10/14/20 0330  ceFEPIme (MAXIPIME) 2 g in sodium  chloride 0.9 % 100 mL IVPB        2 g 200 mL/hr over 30 Minutes Intravenous NOW 10/14/20 0320 10/14/20 0647   10/14/20 0330  vancomycin (VANCOREADY) IVPB 1500 mg/300 mL        1,500 mg 150 mL/hr over 120 Minutes Intravenous NOW 10/14/20 0320 10/14/20 0608   10/14/20 0030  remdesivir 200 mg in sodium chloride 0.9% 250 mL IVPB       "Followed by" Linked Group Details   200 mg 580 mL/hr over 30 Minutes Intravenous Once 10/13/20 2346 10/14/20 0313      Inpatient Medications  Scheduled Meds: . amLODipine  5 mg Oral Daily  . vitamin C  500 mg Oral Daily  . atorvastatin  20 mg Oral Daily  . azaTHIOprine  50 mg Oral Daily  . [START ON 10/17/2020] calcitRIOL  0.5 mcg Oral Q T,Th,Sat-1800  . Chlorhexidine Gluconate Cloth  6 each Topical Q0600  . cholecalciferol  1,000 Units Oral Daily  . ferrous sulfate  325 mg Oral Q Sun  . heparin  5,000 Units Subcutaneous Q8H  . insulin aspart  0-15 Units Subcutaneous TID WC  . insulin aspart  0-5 Units Subcutaneous QHS  . levothyroxine  50 mcg Oral Daily  . metoprolol tartrate  12.5 mg Oral BID  . pantoprazole  40 mg Oral BID  . tamsulosin  0.4 mg Oral Daily  . terazosin  1 mg Oral Daily  . umeclidinium bromide  1 puff Inhalation Daily  . zinc sulfate  220 mg Oral Daily   Continuous Infusions: . sodium chloride    . sodium chloride    . remdesivir 100 mg in NS 100 mL 100 mg (10/15/20 0903)   PRN Meds:.sodium chloride, sodium chloride, acetaminophen, albuterol, alteplase, benzonatate, heparin, lidocaine (PF), lidocaine-prilocaine, pentafluoroprop-tetrafluoroeth   Time Spent in minutes  25   See all Orders from today for further details   Oren Binet M.D on 10/15/2020 at 1:15 PM  To page go to [email or web address] - use universal password  [facility] -  Office  [phone]    Objective:   Vitals:   10/14/20 2000 10/15/20 0454 10/15/20 0742 10/15/20 1153  BP: (!) 142/74 126/63 133/69 134/64  Pulse: 97 87 86 82  Resp: (!) 23 (!)  21 (!) 25 20  Temp: 97.7 F (36.5 C) 97.8 F (36.6 C) 97.7 F (36.5 C) 97.8 F (36.6 C)  TempSrc: Oral Axillary Oral Oral  SpO2: 100% 99% 100% 100%  Weight:      Height:        Wt Readings from Last 3 Encounters:  10/13/20 71 kg  09/22/20 70.7 kg  09/06/20 73.9 kg     Intake/Output Summary (Last 24 hours) at 10/15/2020 1315 Last data filed at 10/15/2020 0539 Gross per 24 hour  Intake 360 ml  Output 500 ml  Net -140 ml     Physical Exam Gen Exam:Alert awake-not in any distress HEENT:atraumatic, normocephalic Chest: B/L clear to auscultation anteriorly CVS:S1S2 regular Abdomen:soft non tender, non distended Extremities:no edema Neurology: Non focal Skin: no rash   Data Review:    CBC Recent Labs  Lab 10/13/20 1847 10/14/20 0737 10/15/20 0348  WBC 5.3 4.0 5.0  HGB 9.0* 9.9* 8.8*  HCT 28.8* 31.9* 26.5*  PLT 169 173 182  MCV 102.1* 101.9* 98.5  MCH 31.9 31.6 32.7  MCHC 31.3 31.0 33.2  RDW 15.0 14.5 14.5  LYMPHSABS 1.8 0.9 1.2  MONOABS 1.1* 0.3 0.6  EOSABS 0.0 0.0 0.0  BASOSABS 0.0 0.0 0.0    Chemistries  Recent Labs  Lab 10/13/20 1847 10/14/20 0737 10/15/20 0348  NA 138 135 138  K 3.9 4.6 4.1  CL 101 100 104  CO2 25 22 21*  GLUCOSE 87 196* 140*  BUN 14 19 30*  CREATININE 4.52* 4.69* 5.28*  CALCIUM 8.1* 8.1* 8.2*  MG  --  1.6* 2.1  AST  --  46* 30  ALT  --  24 19  ALKPHOS  --  116 100  BILITOT  --  0.6 0.5   ------------------------------------------------------------------------------------------------------------------ Recent Labs    10/13/20 2218  TRIG 74    Lab Results  Component Value Date   HGBA1C 5.3 10/14/2020   ------------------------------------------------------------------------------------------------------------------ No results for input(s): TSH, T4TOTAL, T3FREE, THYROIDAB in the last 72 hours.  Invalid input(s):  FREET3 ------------------------------------------------------------------------------------------------------------------ Recent Labs    10/13/20 2216  FERRITIN 534*    Coagulation profile No results for input(s): INR, PROTIME in the last 168 hours.  Recent Labs    10/14/20 0737 10/15/20 0348  DDIMER 2.34* 1.36*    Cardiac Enzymes No results for input(s): CKMB, TROPONINI, MYOGLOBIN in the last 168 hours.  Invalid input(s): CK ------------------------------------------------------------------------------------------------------------------    Component Value Date/Time   BNP 390.3 (H) 10/13/2020 1848    Micro Results Recent Results (from the past 240 hour(s))  Resp Panel by RT-PCR (Flu A&B, Covid) Nasopharyngeal Swab     Status: Abnormal   Collection Time: 10/13/20  6:58 PM   Specimen: Nasopharyngeal Swab; Nasopharyngeal(NP) swabs in vial transport medium  Result Value Ref Range Status   SARS Coronavirus 2 by RT PCR POSITIVE (A) NEGATIVE Final    Comment: RESULT CALLED TO, READ BACK BY AND VERIFIED WITH: A OLEARY RN 2104 10/13/20 A BROWNING (NOTE) SARS-CoV-2 target nucleic acids are DETECTED.  The SARS-CoV-2 RNA is generally detectable in upper respiratory specimens during the acute phase of infection. Positive results are indicative of the presence of the identified virus, but do not rule out bacterial infection or co-infection with other pathogens not detected by the test. Clinical correlation with patient history and other diagnostic information is necessary to determine patient infection status. The expected result is Negative.  Fact Sheet for Patients: EntrepreneurPulse.com.au  Fact Sheet for Healthcare Providers: IncredibleEmployment.be  This test is not yet approved or cleared by the [place] FDA and  has been authorized for detection and/or diagnosis of SARS-CoV-2 by FDA under an Emergency Use Authorization (EUA).   This EUA will remain in effect (meaning this test can b e used) for the duration of  the COVID-19 declaration under Section 564(b)(1) of the Act, 21 U.S.C. section 360bbb-3(b)(1), unless the authorization is terminated or revoked sooner.     Influenza A by PCR NEGATIVE NEGATIVE Final   Influenza B by PCR NEGATIVE NEGATIVE Final    Comment: (NOTE) The Xpert Xpress SARS-CoV-2/FLU/RSV plus assay is intended as an aid in the diagnosis of influenza from Nasopharyngeal swab specimens and should not be used as a sole basis for treatment. Nasal washings and aspirates are unacceptable for Xpert Xpress SARS-CoV-2/FLU/RSV testing.  Fact Sheet for Patients: EntrepreneurPulse.com.au  Fact Sheet for Healthcare Providers: IncredibleEmployment.be  This test is not yet approved or cleared by the [place]  States FDA and has been authorized for detection and/or diagnosis of SARS-CoV-2 by FDA under an Emergency Use Authorization (EUA). This EUA will remain in effect (meaning this test can be used) for the duration of the COVID-19 declaration under Section 564(b)(1) of the Act, 21 U.S.C. section 360bbb-3(b)(1), unless the authorization is terminated or revoked.  Performed at [facility], [place] [address] [place], [place] [address]   Culture, blood (routine x 2)     Status: None (Preliminary result)   Collection Time: 10/13/20  7:22 PM   Specimen: BLOOD  Result Value Ref Range Status   Specimen Description BLOOD BLOOD LEFT FOREARM  Final   Special Requests   Final    BOTTLES DRAWN AEROBIC AND ANAEROBIC Blood Culture adequate volume   Culture   Final    NO GROWTH < 24 HOURS Performed at [facility], [place] [address], [place], [place] [address]    Report Status PENDING  Incomplete  Culture, blood (routine x 2)     Status: None (Preliminary result)   Collection Time: 10/13/20  8:03 PM   Specimen: BLOOD  Result Value Ref Range Status   Specimen  Description BLOOD RIGHT ANTECUBITAL  Final   Special Requests   Final    BOTTLES DRAWN AEROBIC AND ANAEROBIC Blood Culture results may not be optimal due to an inadequate volume of blood received in culture bottles   Culture   Final    NO GROWTH < 24 HOURS Performed at [facility], [place] [address], [place], [place] [address]    Report Status PENDING  Incomplete    Radiology Reports CT Angio Chest PE W/Cm &/Or Wo Cm  Result Date: 10/13/2020 CLINICAL DATA:  PE suspected, high prob Shortness of breath and weakness. EXAM: CT ANGIOGRAPHY CHEST WITH CONTRAST TECHNIQUE: Multidetector CT imaging of the chest was performed using the standard protocol during bolus administration of intravenous contrast. Multiplanar CT image reconstructions and MIPs were obtained to evaluate the vascular anatomy. CONTRAST:  46mL OMNIPAQUE IOHEXOL 350 MG/ML SOLN COMPARISON:  PET CT 09/07/2020. FINDINGS: Cardiovascular: There are no filling defects within the pulmonary arteries to suggest pulmonary embolus. Right-sided dialysis catheter remains in place. Atherosclerosis of the thoracic aorta with tortuosity. No aortic dissection or acute aortic findings. Multi chamber cardiomegaly. Post CABG with dense calcification of native coronary arteries. No pericardial effusion. Mediastinum/Nodes: No enlarged mediastinal or hilar lymph nodes. Small hiatal hernia. Small amount of low-density fluid adjacent to the distal esophagus likely residual tracking ascites, improved from prior exam. No visualized thyroid nodule. Lungs/Pleura: Improved right pleural effusion from prior exam. Trace left pleural effusion is unchanged. Anterior left lower lobe pulmonary nodule measures 2.1 x 1.4 cm, unchanged from recent PET. Subpleural reticulation and areas of honeycombing are unchanged from prior exam. No acute airspace disease. Scattered calcified granuloma. Upper Abdomen: Small volume abdominal ascites. No acute upper abdominal findings.  Musculoskeletal: Median sternotomy. Faint sclerotic density within T10 is unchanged from prior imaging, not hypermetabolic on prior PET. Mild chronic loss of height of upper thoracic vertebra. No acute osseous abnormalities are seen. Review of the MIP images confirms the above findings. IMPRESSION: 1. No pulmonary embolus or acute intrathoracic abnormality. 2. Improved right pleural effusion from prior exam. Trace left pleural effusion is unchanged. 3. Unchanged left lower lobe pulmonary nodule. 4. Grossly stable interstitial lung disease allowing for motion on the current exam. Aortic Atherosclerosis (ICD10-I70.0). Electronically Signed   By: Keith Rake M.D.   On: 10/13/2020  21:43   DG Chest Port 1 View  Result Date: 10/13/2020 CLINICAL DATA:  Shortness of breath and weakness. EXAM: PORTABLE CHEST 1 VIEW COMPARISON:  07/07/2020 FINDINGS: Heart size is stable but enlarged. The patient is status post prior median sternotomy. There is a well-positioned tunneled dialysis catheter on the right. There is stable bibasilar atelectasis and scarring. There is no pneumothorax. No large pleural effusion. No acute osseous abnormality. IMPRESSION: No acute abnormality. Electronically Signed   By: Constance Holster M.D.   On: 10/13/2020 19:19   ECHOCARDIOGRAM LIMITED  Result Date: 10/14/2020    ECHOCARDIOGRAM REPORT   Patient Name:   ADOLPH CLUTTER Date of Exam: 10/14/2020 Medical Rec #:  [id number]    Height:       64.0 in Accession #:    [id number]   Weight:       156.5 lb Date of Birth:  1951/06/15     BSA:          1.763 m Patient Age:    75 years     BP:           133/67 mmHg Patient Gender: M            HR:           92 bpm. Exam Location:  Inpatient Procedure: Limited Echo, Cardiac Doppler and Color Doppler Indications:    Elvated troponin  History:        Patient has no prior history of Echocardiogram examinations.                 CAD, Prior CABG; Risk Factors:Diabetes, Hypertension,                 Dyslipidemia  and Former Smoker. COVID-19. ESRD.  Sonographer:    Clayton Lefort RDCS (AE) Referring Phys: [id number] [facility]  Sonographer Comments: Suboptimal parasternal window. IMPRESSIONS  1. Left ventricular ejection fraction, by estimation, is 55 to 60%. The left ventricle has normal function. The left ventricle has no regional wall motion abnormalities. There is moderate left ventricular hypertrophy. Left ventricular diastolic parameters are indeterminate.  2. Right ventricular systolic function is normal. The right ventricular size is normal. There is normal pulmonary artery systolic pressure.  3. The mitral valve is grossly normal. Mild to moderate mitral valve regurgitation.  4. The aortic valve is normal in structure. Aortic valve regurgitation is trivial. FINDINGS  Left Ventricle: Left ventricular ejection fraction, by estimation, is 55 to 60%. The left ventricle has normal function. The left ventricle has no regional wall motion abnormalities. The left ventricular internal cavity size was normal in size. There is  moderate left ventricular hypertrophy. Left ventricular diastolic parameters are indeterminate. Right Ventricle: The right ventricular size is normal. No increase in right ventricular wall thickness. Right ventricular systolic function is normal. There is normal pulmonary artery systolic pressure. The tricuspid regurgitant velocity is 2.40 m/s, and  with an assumed right atrial pressure of 8 mmHg, the estimated right ventricular systolic pressure is 76.1 mmHg. Left Atrium: Left atrial size was normal in size. Right Atrium: Right atrial size was normal in size. Pericardium: There is no evidence of pericardial effusion. Mitral Valve: The mitral valve is grossly normal. Mild to moderate mitral valve regurgitation. Tricuspid Valve: The tricuspid valve is grossly normal. Tricuspid valve regurgitation is trivial. Aortic Valve: The aortic valve is normal in structure. Aortic valve regurgitation is trivial.  Pulmonic Valve: The pulmonic valve was normal in structure. Pulmonic valve regurgitation is not visualized.  Aorta: The aortic root and ascending aorta are structurally normal, with no evidence of dilitation. IAS/Shunts: The atrial septum is grossly normal.  LEFT VENTRICLE PLAX 2D LVIDd:         4.80 cm LVIDs:         3.30 cm LV PW:         1.80 cm LV IVS:        1.50 cm LVOT diam:     2.40 cm LVOT Area:     4.52 cm  IVC IVC diam: 1.40 cm LEFT ATRIUM         Index LA diam:    3.80 cm 2.16 cm/m   AORTA Ao Root diam: 3.20 cm TRICUSPID VALVE TR Peak grad:   23.0 mmHg TR Vmax:        240.00 cm/s  SHUNTS Systemic Diam: 2.40 cm Mertie Moores MD Electronically signed by Mertie Moores MD Signature Date/Time: 10/14/2020/1:53:15 PM    Final

## 2020-10-15 NOTE — Progress Notes (Signed)
Nephrology Follow-Up Consult note   Assessment/Recommendations: Victor Schultz is a/an 70 y.o. male with a past medical history significant for ESRD who presents to the hospital with COVID  # ESRD:  Hopeful to dialyze today but no urgent issue, may be bumped to tomorrow pending scheduling. Lower time to 3 hours. Home 3.5hrs, 3K, 2.25Ca, Na 137, Bicarbonate 35, Dialyzer: 180 # Volume/ hypertension: EDW 71kg. Attempt to achieve EDW as tolerated. Cont home htn meds.  Makes urine and could give Lasix if volume overload becomes a problem # Anemia of Chronic Kidney Disease: Hemoglobin 8.8 today, maybe dilutional. Currently not receiving ESA. CTM.  # Secondary Hyperparathyroidism/Hyperphosphatemia: cont home sevelamer. Calcitriol 0.81mcg 3x weekly # Vascular access: CVC without issues #COVID: management per primary #History of kidney transplant: now failed on HD. Lower aza to 50mg  daily. Can hold if COVID symptoms worsen  # Additional recommendations: - Dose all meds for creatinine clearance <10 ml/min  - Unless absolutely necessary, no MRIs with gadolinium.  - Implement save arm precautions. Prefer needle sticks in the dorsum of the hands or wrists. No blood pressure measurements in arm. - If blood transfusion is requested during hemodialysis sessions, please alert [place] prior to the session.     Recommendations conveyed to primary service.    [facility] 10/15/2020 7:35 AM  ___________________________________________________________  CC: ESRD  Interval History/Subjective: Due to the COVID pandemic, in attempts to limit transmission and over-utilization of resources (e.g. PPE), the patient was seen virtually today by means of chart review, discussion with staff, and virtual discussion with patient as needed.  No significant changes since yesterday.  Remains on room air.  Urine output good.  Labs reassuring with electrolytes in normal range  today.   Medications:  Current Facility-Administered Medications  Medication Dose Route Frequency Provider Last Rate Last Admin  . acetaminophen (TYLENOL) tablet 650 mg  650 mg Oral Q6H PRN Shela Leff, MD      . albuterol (VENTOLIN HFA) 108 (90 Base) MCG/ACT inhaler 2 puff  2 puff Inhalation Q4H PRN Shela Leff, MD      . amLODipine (NORVASC) tablet 5 mg  5 mg Oral Daily Ghimire, Henreitta Leber, MD   5 mg at 10/14/20 1112  . ascorbic acid (VITAMIN C) tablet 500 mg  500 mg Oral Daily Shela Leff, MD   500 mg at 10/14/20 1106  . atorvastatin (LIPITOR) tablet 20 mg  20 mg Oral Daily Jonetta Osgood, MD   20 mg at 10/14/20 1111  . azaTHIOprine (IMURAN) tablet 50 mg  50 mg Oral Daily Reesa Chew, MD   50 mg at 10/14/20 1543  . benzonatate (TESSALON) capsule 100 mg  100 mg Oral TID PRN Shela Leff, MD      . Derrill Memo ON 10/17/2020] calcitRIOL (ROCALTROL) capsule 0.5 mcg  0.5 mcg Oral Q T,Th,Sat-1800 Reesa Chew, MD      . Chlorhexidine Gluconate Cloth 2 % PADS 6 each  6 each Topical Q0600 Reesa Chew, MD   6 each at 10/15/20 [phone]  . cholecalciferol (VITAMIN D3) tablet 1,000 Units  1,000 Units Oral Daily Shela Leff, MD   1,000 Units at 10/14/20 1106  . ferrous sulfate tablet 325 mg  325 mg Oral Q Sun Ghimire, Shanker M, MD      . heparin injection 5,000 Units  5,000 Units Subcutaneous Q8H Shela Leff, MD   5,000 Units at 10/15/20 0537  . insulin aspart (novoLOG) injection 0-15 Units  0-15 Units  Subcutaneous TID WC Shela Leff, MD   2 Units at 10/14/20 1854  . insulin aspart (novoLOG) injection 0-5 Units  0-5 Units Subcutaneous QHS Shela Leff, MD   2 Units at 10/14/20 2119  . levothyroxine (SYNTHROID) tablet 50 mcg  50 mcg Oral Daily Jonetta Osgood, MD   50 mcg at 10/15/20 0537  . metoprolol tartrate (LOPRESSOR) tablet 12.5 mg  12.5 mg Oral BID Jonetta Osgood, MD   12.5 mg at 10/14/20 2118  . pantoprazole (PROTONIX) EC  tablet 40 mg  40 mg Oral BID Jonetta Osgood, MD   40 mg at 10/14/20 2118  . remdesivir 100 mg in sodium chloride 0.9 % 100 mL IVPB  100 mg Intravenous Daily Ghimire, Shanker M, MD      . tamsulosin (FLOMAX) capsule 0.4 mg  0.4 mg Oral Daily Jonetta Osgood, MD   0.4 mg at 10/14/20 1110  . terazosin (HYTRIN) capsule 1 mg  1 mg Oral Daily Ghimire, Shanker M, MD   1 mg at 10/14/20 1319  . umeclidinium bromide (INCRUSE ELLIPTA) 62.5 MCG/INH 1 puff  1 puff Inhalation Daily Jonetta Osgood, MD   1 puff at 10/14/20 1423  . zinc sulfate capsule 220 mg  220 mg Oral Daily Shela Leff, MD   220 mg at 10/14/20 1106        Physical Exam: Vitals:   10/14/20 2000 10/15/20 0454  BP: (!) 142/74 126/63  Pulse: 97 87  Resp: (!) 23 (!) 21  Temp: 97.7 F (36.5 C) 97.8 F (36.6 C)  SpO2: 100% 99%   No intake/output data recorded.  Intake/Output Summary (Last 24 hours) at 10/15/2020 0735 Last data filed at 10/15/2020 0539 Gross per 24 hour  Intake 360 ml  Output 500 ml  Net -140 ml   Patient not examined today   Test Results I personally reviewed new and old clinical labs and radiology tests Lab Results  Component Value Date   NA 138 10/15/2020   K 4.1 10/15/2020   CL 104 10/15/2020   CO2 21 (L) 10/15/2020   BUN 30 (H) 10/15/2020   CREATININE 5.28 (H) 10/15/2020   GLU 169 09/22/2020   CALCIUM 8.2 (L) 10/15/2020   ALBUMIN 2.1 (L) 10/15/2020

## 2020-10-15 NOTE — Evaluation (Signed)
Physical Therapy Evaluation and Discharge Patient Details Name: Victor Schultz MRN: [id number] DOB: 08/25/1951 Today's Date: 10/15/2020   History of Present Illness  70 y.o. male with medical history significant of ESRD on HD TTS, insulin-dependent type II diabetes, hypertension, hyperlipidemia, CAD, hypothyroidism presented to the ED with complaints of cough, fever, and generalized weakness. COVID+ CT angiogram neg for PE  Clinical Impression   Patient evaluated by Physical Therapy with no further acute PT needs identified. Patient lives alone and performed all mobility independently on room air with sats 99-100% (including walking 400 ft).  PT is signing off. Thank you for this referral.     Follow Up Recommendations No PT follow up    Equipment Recommendations  None recommended by PT    Recommendations for Other Services OT consult (energy conservation; ADLs)     Precautions / Restrictions Precautions Precautions: None      Mobility  Bed Mobility Overal bed mobility: Independent                  Transfers Overall transfer level: Needs assistance Equipment used: None Transfers: Sit to/from Stand Sit to Stand: Supervision         General transfer comment: for safety due to mild dizziness; gone in <15 seconds  Ambulation/Gait Ambulation/Gait assistance: Supervision;Independent Gait Distance (Feet): 400 Feet Assistive device: None Gait Pattern/deviations: WFL(Within Functional Limits)   Gait velocity interpretation: >2.62 ft/sec, indicative of community ambulatory General Gait Details: no dyspnea; able to talk throughout with sats 99-100% on room air  Stairs            Wheelchair Mobility    Modified Rankin (Stroke Patients Only)       Balance Overall balance assessment: No apparent balance deficits (not formally assessed)                                           Pertinent Vitals/Pain Pain Assessment: No/denies pain     Home Living Family/patient expects to be discharged to:: Private residence Living Arrangements: Alone Available Help at Discharge: Family Type of Home: House Home Access: Ramped entrance     Home Layout: One level Home Equipment: Shower seat - built in;Grab bars - tub/shower      Prior Function Level of Independence: Independent               Hand Dominance        Extremity/Trunk Assessment   Upper Extremity Assessment Upper Extremity Assessment: Defer to OT evaluation    Lower Extremity Assessment Lower Extremity Assessment: Overall WFL for tasks assessed    Cervical / Trunk Assessment Cervical / Trunk Assessment: Normal  Communication   Communication: No difficulties  Cognition Arousal/Alertness: Awake/alert Behavior During Therapy: WFL for tasks assessed/performed Overall Cognitive Status: Within Functional Limits for tasks assessed                                        General Comments      Exercises     Assessment/Plan    PT Assessment Patent does not need any further PT services  PT Problem List         PT Treatment Interventions      PT Goals (Current goals can be found in the Care Plan section)  Acute Rehab PT  Goals Patient Stated Goal: none stated PT Goal Formulation: All assessment and education complete, DC therapy    Frequency     Barriers to discharge        Co-evaluation               AM-PAC PT "6 Clicks" Mobility  Outcome Measure Help needed turning from your back to your side while in a flat bed without using bedrails?: None Help needed moving from lying on your back to sitting on the side of a flat bed without using bedrails?: None Help needed moving to and from a bed to a chair (including a wheelchair)?: None Help needed standing up from a chair using your arms (e.g., wheelchair or bedside chair)?: None Help needed to walk in hospital room?: None Help needed climbing 3-5 steps with a railing?  : None 6 Click Score: 24    End of Session Equipment Utilized During Treatment: Gait belt Activity Tolerance: Patient tolerated treatment well Patient left: in chair;with call bell/phone within reach;with chair alarm set Nurse Communication: Mobility status PT Visit Diagnosis: Difficulty in walking, not elsewhere classified (R26.2)    Time: 5701-7793 PT Time Calculation (min) (ACUTE ONLY): 20 min   Charges:   PT Evaluation $PT Eval Low Complexity: 1 Low           Arby Barrette, PT Pager [phone]   Rexanne Mano 10/15/2020, 8:39 AM

## 2020-10-16 ENCOUNTER — Inpatient Hospital Stay: Payer: Self-pay

## 2020-10-16 DIAGNOSIS — U071 COVID-19: Secondary | ICD-10-CM | POA: Diagnosis not present

## 2020-10-16 DIAGNOSIS — R9431 Abnormal electrocardiogram [ECG] [EKG]: Secondary | ICD-10-CM | POA: Diagnosis not present

## 2020-10-16 DIAGNOSIS — E1122 Type 2 diabetes mellitus with diabetic chronic kidney disease: Secondary | ICD-10-CM | POA: Diagnosis not present

## 2020-10-16 DIAGNOSIS — E039 Hypothyroidism, unspecified: Secondary | ICD-10-CM | POA: Diagnosis not present

## 2020-10-16 LAB — CBC WITH DIFFERENTIAL/PLATELET
Abs Immature Granulocytes: 0.02 10*3/uL (ref 0.00–0.07)
Basophils Absolute: 0 10*3/uL (ref 0.0–0.1)
Basophils Relative: 0 %
Eosinophils Absolute: 0 10*3/uL (ref 0.0–0.5)
Eosinophils Relative: 0 %
HCT: 27.5 % — ABNORMAL LOW (ref 39.0–52.0)
Hemoglobin: 8.6 g/dL — ABNORMAL LOW (ref 13.0–17.0)
Immature Granulocytes: 1 %
Lymphocytes Relative: 36 %
Lymphs Abs: 1.5 10*3/uL (ref 0.7–4.0)
MCH: 31.4 pg (ref 26.0–34.0)
MCHC: 31.3 g/dL (ref 30.0–36.0)
MCV: 100.4 fL — ABNORMAL HIGH (ref 80.0–100.0)
Monocytes Absolute: 0.5 10*3/uL (ref 0.1–1.0)
Monocytes Relative: 11 %
Neutro Abs: 2.1 10*3/uL (ref 1.7–7.7)
Neutrophils Relative %: 52 %
Platelets: 183 10*3/uL (ref 150–400)
RBC: 2.74 MIL/uL — ABNORMAL LOW (ref 4.22–5.81)
RDW: 14.6 % (ref 11.5–15.5)
WBC: 4.1 10*3/uL (ref 4.0–10.5)
nRBC: 0 % (ref 0.0–0.2)

## 2020-10-16 LAB — D-DIMER, QUANTITATIVE: D-Dimer, Quant: 1.16 ug/mL-FEU — ABNORMAL HIGH (ref 0.00–0.50)

## 2020-10-16 LAB — COMPREHENSIVE METABOLIC PANEL
ALT: 16 U/L (ref 0–44)
AST: 28 U/L (ref 15–41)
Albumin: 2.1 g/dL — ABNORMAL LOW (ref 3.5–5.0)
Alkaline Phosphatase: 109 U/L (ref 38–126)
Anion gap: 12 (ref 5–15)
BUN: 39 mg/dL — ABNORMAL HIGH (ref 8–23)
CO2: 20 mmol/L — ABNORMAL LOW (ref 22–32)
Calcium: 7.5 mg/dL — ABNORMAL LOW (ref 8.9–10.3)
Chloride: 105 mmol/L (ref 98–111)
Creatinine, Ser: 6.57 mg/dL — ABNORMAL HIGH (ref 0.61–1.24)
GFR, Estimated: 8 mL/min — ABNORMAL LOW (ref >60)
Glucose, Bld: 120 mg/dL — ABNORMAL HIGH (ref 70–99)
Potassium: 4 mmol/L (ref 3.5–5.1)
Sodium: 137 mmol/L (ref 135–145)
Total Bilirubin: 0.4 mg/dL (ref 0.3–1.2)
Total Protein: 6.2 g/dL — ABNORMAL LOW (ref 6.5–8.1)

## 2020-10-16 LAB — HEPATITIS B SURFACE ANTIGEN: Hepatitis B Surface Ag: NONREACTIVE

## 2020-10-16 LAB — GLUCOSE, CAPILLARY
Glucose-Capillary: 109 mg/dL — ABNORMAL HIGH (ref 70–99)
Glucose-Capillary: 83 mg/dL (ref 70–99)

## 2020-10-16 LAB — C-REACTIVE PROTEIN: CRP: 1 mg/dL — ABNORMAL HIGH (ref <1.0)

## 2020-10-16 MED ORDER — AZATHIOPRINE 50 MG PO TABS: 50.0000 mg | ORAL_TABLET | Freq: Every day | ORAL | 0 refills | Status: DC

## 2020-10-16 NOTE — TOC Transition Note (Signed)
Transition of Care [facility]) - CM/SW Discharge Note   Patient Details  Name: Victor Schultz MRN: [id number] Date of Birth: 11-Dec-1950  Transition of [facility] CM/SW Contact:  Bartholomew Crews, RN Phone Number: [phone] 10/16/2020, 1:18 PM   Clinical Narrative:     Patient to transition home today after hemodialysis. Arrangements made for covid shift for outpatient hemodialysis per renal navigator, Colleen. [place] PT OT orders written. Patient is active with [facility] who will follow up with patient at home. No further TOC needs identified at this time.  Final next level of care: Home w Home Health Services Barriers to Discharge: No Barriers Identified   Patient Goals and CMS Choice   CMS Medicare.gov Compare Post Acute Care list provided to:: Patient    Discharge Placement                       Discharge Plan and Services                DME Arranged: N/A DME Agency: NA       HH Arranged: PT,OT [place] Agency: [facility] (formerly [employer]) Date [place]: 10/16/20 Time [place]: 1200 Representative spoke with at [place]: [place]  Social Determinants of Health ([place]) Interventions     Readmission Risk Interventions No flowsheet data found.

## 2020-10-16 NOTE — Progress Notes (Signed)
Nephrology Follow-Up Consult note   Assessment/Recommendations: Victor Schultz is a/an 70 y.o. male with a past medical history significant for ESRD who presents to the hospital with COVID  Problems: # ESRD:  HD today in progress. Going home per pmd.  # Volume/ hypertension: EDW 71kg. Attempt to achieve EDW as tolerated.  # Anemia of Chronic Kidney Disease: Hemoglobin 8.8 today, maybe dilutional. Currently not receiving ESA.  # Secondary Hyperparathyroidism/Hyperphosphatemia: cont home sevelamer. Calcitriol 0.74mcg 3x weekly # Vascular access: TDC without issues #COVID: management per primary. For Dc today.  #History of kidney transplant: now failed on HD. Cont lower dose of Imuran at 50mg  qd given lower WBC at 4k, will follow up WBC weekly at outpatient HD. Also can hold if COVID symptoms worsen   Kelly Splinter, MD 10/16/2020, 10:50 AM   ___________________________________________________________  CC: ESRD  Interval History/Subjective: pt seen on HD, no c/o's, looking forward to dc home.    Medications:  Current Facility-Administered Medications  Medication Dose Route Frequency Provider Last Rate Last Admin  . 0.9 %  sodium chloride infusion  100 mL Intravenous PRN Reesa Chew, MD      . 0.9 %  sodium chloride infusion  100 mL Intravenous PRN Reesa Chew, MD      . acetaminophen (TYLENOL) tablet 650 mg  650 mg Oral Q6H PRN Shela Leff, MD      . albuterol (VENTOLIN HFA) 108 (90 Base) MCG/ACT inhaler 2 puff  2 puff Inhalation Q4H PRN Shela Leff, MD      . alteplase (CATHFLO ACTIVASE) injection 2 mg  2 mg Intracatheter Once PRN Reesa Chew, MD      . amLODipine (NORVASC) tablet 5 mg  5 mg Oral Daily Jonetta Osgood, MD   5 mg at 10/15/20 0858  . ascorbic acid (VITAMIN C) tablet 500 mg  500 mg Oral Daily Shela Leff, MD   500 mg at 10/15/20 0857  . atorvastatin (LIPITOR) tablet 20 mg  20 mg Oral Daily Jonetta Osgood, MD   20 mg at 10/15/20  0857  . azaTHIOprine (IMURAN) tablet 50 mg  50 mg Oral Daily Reesa Chew, MD   50 mg at 10/15/20 0856  . benzonatate (TESSALON) capsule 100 mg  100 mg Oral TID PRN Shela Leff, MD      . Derrill Memo ON 10/17/2020] calcitRIOL (ROCALTROL) capsule 0.5 mcg  0.5 mcg Oral Q T,Th,Sat-1800 Reesa Chew, MD      . Chlorhexidine Gluconate Cloth 2 % PADS 6 each  6 each Topical Q0600 Reesa Chew, MD   6 each at 10/16/20 0533  . cholecalciferol (VITAMIN D3) tablet 1,000 Units  1,000 Units Oral Daily Shela Leff, MD   1,000 Units at 10/15/20 0857  . ferrous sulfate tablet 325 mg  325 mg Oral Q Sun Ghimire, Henreitta Leber, MD   325 mg at 10/15/20 0858  . heparin injection 1,000 Units  1,000 Units Dialysis PRN Reesa Chew, MD      . heparin injection 5,000 Units  5,000 Units Subcutaneous Camelia Phenes Shela Leff, MD   5,000 Units at 10/16/20 0524  . insulin aspart (novoLOG) injection 0-15 Units  0-15 Units Subcutaneous TID WC Shela Leff, MD   2 Units at 10/15/20 1702  . insulin aspart (novoLOG) injection 0-5 Units  0-5 Units Subcutaneous QHS Shela Leff, MD   2 Units at 10/14/20 2119  . levothyroxine (SYNTHROID) tablet 50 mcg  50 mcg Oral Daily Ghimire, Henreitta Leber, MD  50 mcg at 10/16/20 0524  . lidocaine (PF) (XYLOCAINE) 1 % injection 5 mL  5 mL Intradermal PRN Reesa Chew, MD      . lidocaine-prilocaine (EMLA) cream 1 application  1 application Topical PRN Reesa Chew, MD      . metoprolol tartrate (LOPRESSOR) tablet 12.5 mg  12.5 mg Oral BID Jonetta Osgood, MD   12.5 mg at 10/15/20 2102  . pantoprazole (PROTONIX) EC tablet 40 mg  40 mg Oral BID Jonetta Osgood, MD   40 mg at 10/15/20 2102  . pentafluoroprop-tetrafluoroeth (GEBAUERS) aerosol 1 application  1 application Topical PRN Reesa Chew, MD      . remdesivir 100 mg in sodium chloride 0.9 % 100 mL IVPB  100 mg Intravenous Daily Jonetta Osgood, MD   Stopped at 10/15/20 1000  . tamsulosin  (FLOMAX) capsule 0.4 mg  0.4 mg Oral Daily Jonetta Osgood, MD   0.4 mg at 10/15/20 0856  . terazosin (HYTRIN) capsule 1 mg  1 mg Oral Daily Jonetta Osgood, MD   1 mg at 10/15/20 0858  . umeclidinium bromide (INCRUSE ELLIPTA) 62.5 MCG/INH 1 puff  1 puff Inhalation Daily Jonetta Osgood, MD   1 puff at 10/16/20 [phone]  . zinc sulfate capsule 220 mg  220 mg Oral Daily Shela Leff, MD   220 mg at 10/15/20 0856        Physical Exam: Vitals:   10/16/20 0930 10/16/20 1000  BP: 123/69 123/67  Pulse: 88 84  Resp: (!) 24 (!) 21  Temp:    SpO2:     No intake/output data recorded.  Intake/Output Summary (Last 24 hours) at 10/16/2020 1033 Last data filed at 10/16/2020 0300 Gross per 24 hour  Intake 102.29 ml  Output --  Net 102.29 ml   Patient not examined today   Test Results I personally reviewed new and old clinical labs and radiology tests Lab Results  Component Value Date   NA 137 10/16/2020   K 4.0 10/16/2020   CL 105 10/16/2020   CO2 20 (L) 10/16/2020   BUN 39 (H) 10/16/2020   CREATININE 6.57 (H) 10/16/2020   GLU 169 09/22/2020   CALCIUM 7.5 (L) 10/16/2020   ALBUMIN 2.1 (L) 10/16/2020

## 2020-10-16 NOTE — Progress Notes (Signed)
Renal Navigator appreciates notification from [facility] team that patient will be discharged today. Navigator notes HD has been bumped due to high COVID census.  Navigator spoke with [facility], who states plan will be for HD to remain on TTS second shift with a seat time of 1pm while he is needing treatment in isolation.  Navigator attempted to call patient, who is currently in HD in the hospital, but he did not answer his cell. Navigator called ER contact/Emma Pennella, who states she is his step-mother. She reports he drives himself to treatment and that if he is not feeling well, she will drive him or his sister will drive him. She is aware of his COVID positive status and states she and his sister were also around him while he was most contagious, have quarantined for 5 days and both tested negative. She understands his new chair time, states not issues with transportation, and hears he may be discharged today. Navigator states he should return for HD at his clinic tomorrow. Ms. Lumpkin stated understanding and appreciation for the call. She states she will absolutely get this information to patient.  Victor Schultz, [place] Renal Navigator [phone]

## 2020-10-16 NOTE — Discharge Instructions (Signed)
COVID-19: What to Do if You Are Sick If you have a fever, cough or other symptoms, you might have COVID-19. Most people have mild illness and are able to recover at home. If you are sick:  Keep track of your symptoms.  If you have an emergency warning sign (including trouble breathing), call 911. Steps to help prevent the spread of COVID-19 if you are sick If you are sick with COVID-19 or think you might have COVID-19, follow the steps below to care for yourself and to help protect other people in your home and community. Stay home except to get medical care  Stay home. Most people with COVID-19 have mild illness and can recover at home without medical care. Do not leave your home, except to get medical care. Do not visit public areas.  Take care of yourself. Get rest and stay hydrated. Take over-the-counter medicines, such as acetaminophen, to help you feel better.  Stay in touch with your doctor. Call before you get medical care. Be sure to get care if you have trouble breathing, or have any other emergency warning signs, or if you think it is an emergency.  Avoid public transportation, ride-sharing, or taxis. Separate yourself from other people As much as possible, stay in a specific room and away from other people and pets in your home. If possible, you should use a separate bathroom. If you need to be around other people or animals in or outside of the home, wear a mask. Tell your close contactsthat they may have been exposed to COVID-19. An infected person can spread COVID-19 starting 48 hours (or 2 days) before the person has any symptoms or tests positive. By letting your close contacts know they may have been exposed to COVID-19, you are helping to protect everyone.  Additional guidance is available for those living in close quarters and shared housing.  See COVID-19 and Animals if you have questions about pets.  If you are diagnosed with COVID-19, someone from the health department  may call you. Answer the call to slow the spread. Monitor your symptoms  Symptoms of COVID-19 include fever, cough, or other symptoms.  Follow care instructions from your healthcare provider and local health department. Your local health authorities may give instructions on checking your symptoms and reporting information. When to seek emergency medical attention Look for emergency warning signs* for COVID-19. If someone is showing any of these signs, seek emergency medical care immediately:  Trouble breathing  Persistent pain or pressure in the chest  New confusion  Inability to wake or stay awake  Pale, gray, or blue-colored skin, lips, or nail beds, depending on skin tone *This list is not all possible symptoms. Please call your medical provider for any other symptoms that are severe or concerning to you. Call 911 or call ahead to your local emergency facility: Notify the operator that you are seeking care for someone who has or may have COVID-19. Call ahead before visiting your doctor  Call ahead. Many medical visits for routine care are being postponed or done by phone or telemedicine.  If you have a medical appointment that cannot be postponed, call your doctor's office, and tell them you have or may have COVID-19. This will help the office protect themselves and other patients. Get  tested  If you have symptoms of COVID-19, get tested. While waiting for test results, you stay away from others, including staying apart from those living in your household.  You can visit your state, tribal,  local, and territorialhealth department's website to look for the latest local information on testing sites. If you are sick, wear a mask over your nose and mouth  You should wear a mask over your nose and mouth if you must be around other people or animals, including pets (even at home).  You don't need to wear the mask if you are alone. If you can't put on a mask (because of trouble  breathing, for example), cover your coughs and sneezes in some other way. Try to stay at least 6 feet away from other people. This will help protect the people around you.  Masks should not be placed on young children under age 46 years, anyone who has trouble breathing, or anyone who is not able to remove the mask without help. Note: During the COVID-19 pandemic, medical grade facemasks are reserved for healthcare workers and some first responders. Cover your coughs and sneezes  Cover your mouth and nose with a tissue when you cough or sneeze.  Throw away used tissues in a lined trash can.  Immediately wash your hands with soap and water for at least 20 seconds. If soap and water are not available, clean your hands with an alcohol-based hand sanitizer that contains at least 60% alcohol. Clean your hands often  Wash your hands often with soap and water for at least 20 seconds. This is especially important after blowing your nose, coughing, or sneezing; going to the bathroom; and before eating or preparing food.  Use hand sanitizer if soap and water are not available. Use an alcohol-based hand sanitizer with at least 60% alcohol, covering all surfaces of your hands and rubbing them together until they feel dry.  Soap and water are the best option, especially if hands are visibly dirty.  Avoid touching your eyes, nose, and mouth with unwashed hands.  Handwashing Tips Avoid sharing personal household items  Do not share dishes, drinking glasses, cups, eating utensils, towels, or bedding with other people in your home.  Wash these items thoroughly after using them with soap and water or put in the dishwasher. Clean all "high-touch" surfaces everyday  Clean and disinfect high-touch surfaces in your "sick room" and bathroom; wear disposable gloves. Let someone else clean and disinfect surfaces in common areas, but you should clean your bedroom and bathroom, if possible.  If a caregiver or  other person needs to clean and disinfect a sick person's bedroom or bathroom, they should do so on an as-needed basis. The caregiver/other person should wear a mask and disposable gloves prior to cleaning. They should wait as long as possible after the person who is sick has used the bathroom before coming in to clean and use the bathroom. ? High-touch surfaces include phones, remote controls, counters, tabletops, doorknobs, bathroom fixtures, toilets, keyboards, tablets, and bedside tables.  Clean and disinfect areas that may have blood, stool, or body fluids on them.  Use household cleaners and disinfectants. Clean the area or item with soap and water or another detergent if it is dirty. Then, use a household disinfectant. ? Be sure to follow the instructions on the label to ensure safe and effective use of the product. Many products recommend keeping the surface wet for several minutes to ensure germs are killed. Many also recommend precautions such as wearing gloves and making sure you have good ventilation during use of the product. ? Use a product from [employer]: Disinfectants for Coronavirus (KZSWF-09). ? Complete Disinfection Guidance When you can be  around others after being sick with COVID-19 Deciding when you can be around others is different for different situations. Find out when you can safely end home isolation. For any additional questions about your care, contact your healthcare provider or state or local health department. 12/22/2019 Content source: [facility] ([facility]), Division of Viral Diseases This information is not intended to replace advice given to you by your health care provider. Make sure you discuss any questions you have with your health care provider. Document Revised: 08/07/2020 Document Reviewed: 08/07/2020 Elsevier Patient Education  2021 [place] Can Do to Manage Your COVID-19 Symptoms at  Home If you have possible or confirmed COVID-19: 1. Stay home except to get medical care. 2. Monitor your symptoms carefully. If your symptoms get worse, call your healthcare provider immediately. 3. Get rest and stay hydrated. 4. If you have a medical appointment, call the healthcare provider ahead of time and tell them that you have or may have COVID-19. 5. For medical emergencies, call 911 and notify the dispatch personnel that you have or may have COVID-19. 6. Cover your cough and sneezes with a tissue or use the inside of your elbow. 7. Wash your hands often with soap and water for at least 20 seconds or clean your hands with an alcohol-based hand sanitizer that contains at least 60% alcohol. 8. As much as possible, stay in a specific room and away from other people in your home. Also, you should use a separate bathroom, if available. If you need to be around other people in or outside of the home, wear a mask. 9. Avoid sharing personal items with other people in your household, like dishes, towels, and bedding. 10. Clean all surfaces that are touched often, like counters, tabletops, and doorknobs. Use household cleaning sprays or wipes according to the label instructions. [email or web address] 04/21/2020 This information is not intended to replace advice given to you by your health care provider. Make sure you discuss any questions you have with your health care provider. Document Revised: 08/07/2020 Document Reviewed: 08/07/2020 Elsevier Patient Education  2021 [employer].

## 2020-10-16 NOTE — Progress Notes (Signed)
Patient of the unit for dialysis.

## 2020-10-16 NOTE — Discharge Summary (Addendum)
PATIENT DETAILS Name: Victor Schultz Age: 70 y.o. Sex: male Date of Birth: 05-01-1951 MRN: [id number]. Admitting Physician: Shela Leff, MD IWP:[id number], Zeb Comfort, MD  Admit Date: 10/13/2020 Discharge date: 10/16/2020  Recommendations for Outpatient Follow-up:  1. Follow up with PCP in 1-2 weeks 2. Please obtain CMP/CBC in one week   Admitted From:  Home  Disposition: Home with home health Ignacio: Yes  Equipment/Devices: None  Discharge Condition: Stable  CODE STATUS: FULL CODE  Diet recommendation:  Diet Order            Diet - low sodium heart healthy           Diet renal/carb modified with fluid restriction Diet-HS Snack? Nothing; Fluid restriction: 1200 mL Fluid; Room service appropriate? Yes; Fluid consistency: Thin  Diet effective now                  Brief Narrative: Patient is a 70 y.o. male with PMHx of failed kidney transplant x2 (1974, 1982) now ESRD on HD TTS, DM-2, HTN, HLD, CAD, hypothyroidism, chronic hepatitis C-2-day history of fever, cough and generalized weakness-patient found to have COVID-19 infection-and subsequently admitted to the hospitalist service.   COVID-19 vaccinated status: Vaccinated including booster ( around 4 weeks back).  Significant Events: 1/7>> Admit to [facility] for COVID-19 infection  Significant studies: 1/7>> CTA chest: No PE-no lung infiltrates. 1/8>> Echo: EF 55-60%  COVID-19 medications: Steroids: 1/7>>x1 Remdesivir: 1/7>>1/10  Antibiotics: Cefepime: 1/7>> 1/8 Vancomycin: 1/7 x 1  Microbiology data: 1/7>>Blood culture: No growth  Procedures: None  Consults: None   Brief Hospital Course: COVID-19 infection: No evidence of pneumonia on imaging studies-not hypoxic-given high risk medical comorbidities-was treated with a 3-day course of Remdesivir-as monoclonal antibody not available due to national shortage.  He continues to feel well-no major respiratory  symptoms-and remains on room air.     COVID-19 Labs:  Recent Labs    10/13/20 2216 10/14/20 0737 10/15/20 0348 10/16/20 0154  DDIMER  --  2.34* 1.36* 1.16*  FERRITIN 534*  --   --   --   LDH 168  --   --   --   CRP 0.6 0.8 0.7 1.0*    Lab Results  Component Value Date   SARSCOV2NAA POSITIVE (A) 10/13/2020   [place] NEGATIVE 05/20/2020   [place] NEGATIVE 05/05/2020   [place] NEGATIVE 12/22/2019     Minimally elevated troponin: No anginal symptoms-suspect either a false positive elevation due to ESRD or mild demand ischemia.  EKG without acute changes-echo with preserved EF.  No further work-up required.  Hypomagnesemia: Repleted.  Mildly prolonged QTC: Resolved-after correction of hypomagnesemia.  History of failed kidney transplant x2: He is now ESRD-on azathioprine-nephrology has decreased dosage to 50 mg.    ESRD on HD TTS: Nephrology following-HD managed per nephrology.  DM-2 (A1c 5.3 on 1/8) with uncontrolled hyperglycemia due to steroids:  Hyperglycemia resolved after discontinuing steroids.  HTN: BP stable-resume amlodipine, Terazosin and metoprolol  Hypothyroidism: Continue Synthroid  COPD: Not wheezing-continue inhalers  BPH: Resume Flomax  Lobulated left lower lobe pulmonary nodule: Followed by Dr. Hinton Rao at the cancer center in [facility].  Will defer further work-up to the outpatient setting.  Debility/deconditioning:  Feels much better after fever resolved-we will order home health services postdischarge.   Discharge Diagnoses:  Principal Problem:   COVID-19 virus infection Active Problems:   CAD in native artery   Hypothyroidism, unspecified   QT prolongation   Type 2  diabetes mellitus [facility])   Discharge Instructions:    Person Under Monitoring Name: Victor Schultz  Location: [address] [place] [address]   Infection Prevention Recommendations for Individuals Confirmed to have, or Being  Evaluated for, 2019 Novel Coronavirus (COVID-19) Infection Who Receive Care at Home  Individuals who are confirmed to have, or are being evaluated for, COVID-19 should follow the prevention steps below until a healthcare provider or local or state health department says they can return to normal activities.  Stay home except to get medical care You should restrict activities outside your home, except for getting medical care. Do not go to work, school, or public areas, and do not use public transportation or taxis.  Call ahead before visiting your doctor Before your medical appointment, call the healthcare provider and tell them that you have, or are being evaluated for, COVID-19 infection. This will help the healthcare provider's office take steps to keep other people from getting infected. Ask your healthcare provider to call the local or state health department.  Monitor your symptoms Seek prompt medical attention if your illness is worsening (e.g., difficulty breathing). Before going to your medical appointment, call the healthcare provider and tell them that you have, or are being evaluated for, COVID-19 infection. Ask your healthcare provider to call the local or state health department.  Wear a facemask You should wear a facemask that covers your nose and mouth when you are in the same room with other people and when you visit a healthcare provider. People who live with or visit you should also wear a facemask while they are in the same room with you.  Separate yourself from other people in your home As much as possible, you should stay in a different room from other people in your home. Also, you should use a separate bathroom, if available.  Avoid sharing household items You should not share dishes, drinking glasses, cups, eating utensils, towels, bedding, or other items with other people in your home. After using these items, you should wash them thoroughly with soap and  water.  Cover your coughs and sneezes Cover your mouth and nose with a tissue when you cough or sneeze, or you can cough or sneeze into your sleeve. Throw used tissues in a lined trash can, and immediately wash your hands with soap and water for at least 20 seconds or use an alcohol-based hand rub.  Wash your [facility] your hands often and thoroughly with soap and water for at least 20 seconds. You can use an alcohol-based hand sanitizer if soap and water are not available and if your hands are not visibly dirty. Avoid touching your eyes, nose, and mouth with unwashed hands.   Prevention Steps for Caregivers and Household Members of Individuals Confirmed to have, or Being Evaluated for, COVID-19 Infection Being Cared for in the Home  If you live with, or provide care at home for, a person confirmed to have, or being evaluated for, COVID-19 infection please follow these guidelines to prevent infection:  Follow healthcare provider's instructions Make sure that you understand and can help the patient follow any healthcare provider instructions for all care.  Provide for the patient's basic needs You should help the patient with basic needs in the home and provide support for getting groceries, prescriptions, and other personal needs.  Monitor the patient's symptoms If they are getting sicker, call his or her medical provider and tell them that the patient has, or is being evaluated for,  COVID-19 infection. This will help the healthcare provider's office take steps to keep other people from getting infected. Ask the healthcare provider to call the local or state health department.  Limit the number of people who have contact with the patient  If possible, have only one caregiver for the patient.  Other household members should stay in another home or place of residence. If this is not possible, they should stay  in another room, or be separated from the patient as much as possible.  Use a separate bathroom, if available.  Restrict visitors who do not have an essential need to be in the home.  Keep older adults, very young children, and other sick people away from the patient Keep older adults, very young children, and those who have compromised immune systems or chronic health conditions away from the patient. This includes people with chronic heart, lung, or kidney conditions, diabetes, and cancer.  Ensure good ventilation Make sure that shared spaces in the home have good air flow, such as from an air conditioner or an opened window, weather permitting.  Wash your hands often  Wash your hands often and thoroughly with soap and water for at least 20 seconds. You can use an alcohol based hand sanitizer if soap and water are not available and if your hands are not visibly dirty.  Avoid touching your eyes, nose, and mouth with unwashed hands.  Use disposable paper towels to dry your hands. If not available, use dedicated cloth towels and replace them when they become wet.  Wear a facemask and gloves  Wear a disposable facemask at all times in the room and gloves when you touch or have contact with the patient's blood, body fluids, and/or secretions or excretions, such as sweat, saliva, sputum, nasal mucus, vomit, urine, or feces.  Ensure the mask fits over your nose and mouth tightly, and do not touch it during use.  Throw out disposable facemasks and gloves after using them. Do not reuse.  Wash your hands immediately after removing your facemask and gloves.  If your personal clothing becomes contaminated, carefully remove clothing and launder. Wash your hands after handling contaminated clothing.  Place all used disposable facemasks, gloves, and other waste in a lined container before disposing them with other household waste.  Remove gloves and wash your hands immediately after handling these items.  Do not share dishes, glasses, or other household items with  the patient  Avoid sharing household items. You should not share dishes, drinking glasses, cups, eating utensils, towels, bedding, or other items with a patient who is confirmed to have, or being evaluated for, COVID-19 infection.  After the person uses these items, you should wash them thoroughly with soap and water.  Wash laundry thoroughly  Immediately remove and wash clothes or bedding that have blood, body fluids, and/or secretions or excretions, such as sweat, saliva, sputum, nasal mucus, vomit, urine, or feces, on them.  Wear gloves when handling laundry from the patient.  Read and follow directions on labels of laundry or clothing items and detergent. In general, wash and dry with the warmest temperatures recommended on the label.  Clean all areas the individual has used often  Clean all touchable surfaces, such as counters, tabletops, doorknobs, bathroom fixtures, toilets, phones, keyboards, tablets, and bedside tables, every day. Also, clean any surfaces that may have blood, body fluids, and/or secretions or excretions on them.  Wear gloves when cleaning surfaces the patient has come in contact with.  Use a  diluted bleach solution (e.g., dilute bleach with 1 part bleach and 10 parts water) or a household disinfectant with a label that says EPA-registered for coronaviruses. To make a bleach solution at home, add 1 tablespoon of bleach to 1 quart (4 cups) of water. For a larger supply, add  cup of bleach to 1 gallon (16 cups) of water.  Read labels of cleaning products and follow recommendations provided on product labels. Labels contain instructions for safe and effective use of the cleaning product including precautions you should take when applying the product, such as wearing gloves or eye protection and making sure you have good ventilation during use of the product.  Remove gloves and wash hands immediately after cleaning.  Monitor yourself for signs and symptoms of  illness Caregivers and household members are considered close contacts, should monitor their health, and will be asked to limit movement outside of the home to the extent possible. Follow the monitoring steps for close contacts listed on the symptom monitoring form.   ? If you have additional questions, contact your local health department or call the epidemiologist on call at [phone] (available 24/7). ? This guidance is subject to change. For the most up-to-date guidance from CDC, please refer to their website: YouBlogs.pl    Activity:  As tolerated   Discharge Instructions    Call MD for:  difficulty breathing, headache or visual disturbances   Complete by: As directed    Diet - low sodium heart healthy   Complete by: As directed    Discharge instructions   Complete by: As directed    Follow with Primary MD  Lillard Anes, MD in 1-2 weeks  Please get a complete blood count and chemistry panel checked by your Primary MD at your next visit, and again as instructed by your Primary MD.  Get Medicines reviewed and adjusted: Please take all your medications with you for your next visit with your Primary MD  Laboratory/radiological data: Please request your Primary MD to go over all hospital tests and procedure/radiological results at the follow up, please ask your Primary MD to get all Hospital records sent to his/her office.  In some cases, they will be blood work, cultures and biopsy results pending at the time of your discharge. Please request that your primary care M.D. follows up on these results.  Also Note the following: If you experience worsening of your admission symptoms, develop shortness of breath, life threatening emergency, suicidal or homicidal thoughts you must seek medical attention immediately by calling 911 or calling your MD immediately  if symptoms less severe.  You must read complete  instructions/literature along with all the possible adverse reactions/side effects for all the Medicines you take and that have been prescribed to you. Take any new Medicines after you have completely understood and accpet all the possible adverse reactions/side effects.   Do not drive when taking Pain medications or sleeping medications (Benzodaizepines)  Do not take more than prescribed Pain, Sleep and Anxiety Medications. It is not advisable to combine anxiety,sleep and pain medications without talking with your primary care practitioner  Special Instructions: If you have smoked or chewed Tobacco  in the last 2 yrs please stop smoking, stop any regular Alcohol  and or any Recreational drug use.  Wear Seat belts while driving.  Please note: You were cared for by a hospitalist during your hospital stay. Once you are discharged, your primary care physician will handle any further medical issues. Please note that NO  REFILLS for any discharge medications will be authorized once you are discharged, as it is imperative that you return to your primary care physician (or establish a relationship with a primary care physician if you do not have one) for your post hospital discharge needs so that they can reassess your need for medications and monitor your lab values.   1.)  10 days of isolation from 10/13/2020  2.)  If you develop shortness of breath-please seek immediate medical attention  3.)  Your dose of azathioprine has been reduced to 50 mg once a day   Increase activity slowly   Complete by: As directed    No wound care   Complete by: As directed      Allergies as of 10/16/2020      Reactions   Methoxy Polyethylene Glycol-epoetin Beta Other (See Comments)      Medication List    STOP taking these medications   Lantus SoloStar 100 UNIT/ML Solostar Pen Generic drug: insulin glargine   Levemir FlexTouch 100 UNIT/ML FlexPen Generic drug: insulin detemir   Lokelma 10 g Pack  packet Generic drug: sodium zirconium cyclosilicate     TAKE these medications   albuterol 108 (90 Base) MCG/ACT inhaler Commonly known as: VENTOLIN HFA Inhale 1-2 puffs into the lungs every 4 (four) hours as needed for wheezing or shortness of breath.   amLODipine 5 MG tablet Commonly known as: NORVASC Take 1 tablet (5 mg total) by mouth daily.   atorvastatin 20 MG tablet Commonly known as: LIPITOR Take 1 tablet (20 mg total) by mouth daily.   azaTHIOprine 50 MG tablet Commonly known as: IMURAN Take 1 tablet (50 mg total) by mouth daily. What changed:   when to take this  additional instructions   B-D UF III MINI PEN NEEDLES 31G X 5 MM Misc Generic drug: Insulin Pen Needle USE WITH LANTUS AS DIRECTED   calcitRIOL 0.25 MCG capsule Commonly known as: ROCALTROL Take 0.25 mcg by mouth daily.   ferrous sulfate 325 (65 FE) MG tablet Take 325 mg by mouth every Sunday.   levothyroxine 50 MCG tablet Commonly known as: SYNTHROID Take 1 tablet (50 mcg total) by mouth daily.   metoprolol tartrate 25 MG tablet Commonly known as: LOPRESSOR Take 0.5 tablets (12.5 mg total) by mouth 2 (two) times daily.   pantoprazole 40 MG tablet Commonly known as: Protonix Take 1 tablet (40 mg total) by mouth 2 (two) times daily.   sevelamer 800 MG tablet Commonly known as: RENAGEL Take 1 tablet (800 mg total) by mouth in the morning and at bedtime.   sodium bicarbonate 650 MG tablet Take 1,300 mg by mouth 2 (two) times daily.   Spiriva HandiHaler 18 MCG inhalation capsule Generic drug: tiotropium Place into inhaler and inhale.   tamsulosin 0.4 MG Caps capsule Commonly known as: FLOMAX Take 1 capsule (0.4 mg total) by mouth daily.   terazosin 1 MG capsule Commonly known as: HYTRIN Take 1 capsule (1 mg total) by mouth daily.   Vitamin D (Ergocalciferol) 1.25 MG (50000 UNIT) Caps capsule Commonly known as: DRISDOL Take 50,000 Units by mouth every Sunday.       Follow-up  Information    Lillard Anes, MD. Schedule an appointment as soon as possible for a visit in 1 week(s).   Specialty: Family Medicine Contact information: [address] [place] [address] [phone]        Hemodialysis clinic Follow up.   Why: Follow-up with your  HD clinic as instructed by them.       [facility] Follow up.   Specialty: Home Health Services Why: the office will call to schedule home health visits Contact information: [address] [place]  [address] [phone]              Allergies  Allergen Reactions  . Methoxy Polyethylene Glycol-Epoetin Beta Other (See Comments)     Other Procedures/Studies: CT Angio Chest PE W/Cm &/Or Wo Cm  Result Date: 10/13/2020 CLINICAL DATA:  PE suspected, high prob Shortness of breath and weakness. EXAM: CT ANGIOGRAPHY CHEST WITH CONTRAST TECHNIQUE: Multidetector CT imaging of the chest was performed using the standard protocol during bolus administration of intravenous contrast. Multiplanar CT image reconstructions and MIPs were obtained to evaluate the vascular anatomy. CONTRAST:  50mL OMNIPAQUE IOHEXOL 350 MG/ML SOLN COMPARISON:  PET CT 09/07/2020. FINDINGS: Cardiovascular: There are no filling defects within the pulmonary arteries to suggest pulmonary embolus. Right-sided dialysis catheter remains in place. Atherosclerosis of the thoracic aorta with tortuosity. No aortic dissection or acute aortic findings. Multi chamber cardiomegaly. Post CABG with dense calcification of native coronary arteries. No pericardial effusion. Mediastinum/Nodes: No enlarged mediastinal or hilar lymph nodes. Small hiatal hernia. Small amount of low-density fluid adjacent to the distal esophagus likely residual tracking ascites, improved from prior exam. No visualized thyroid nodule. Lungs/Pleura: Improved right pleural effusion from prior exam. Trace left pleural effusion is unchanged. Anterior left lower lobe  pulmonary nodule measures 2.1 x 1.4 cm, unchanged from recent PET. Subpleural reticulation and areas of honeycombing are unchanged from prior exam. No acute airspace disease. Scattered calcified granuloma. Upper Abdomen: Small volume abdominal ascites. No acute upper abdominal findings. Musculoskeletal: Median sternotomy. Faint sclerotic density within T10 is unchanged from prior imaging, not hypermetabolic on prior PET. Mild chronic loss of height of upper thoracic vertebra. No acute osseous abnormalities are seen. Review of the MIP images confirms the above findings. IMPRESSION: 1. No pulmonary embolus or acute intrathoracic abnormality. 2. Improved right pleural effusion from prior exam. Trace left pleural effusion is unchanged. 3. Unchanged left lower lobe pulmonary nodule. 4. Grossly stable interstitial lung disease allowing for motion on the current exam. Aortic Atherosclerosis (ICD10-I70.0). Electronically Signed   By: Keith Rake M.D.   On: 10/13/2020 21:43   DG Chest Port 1 View  Result Date: 10/13/2020 CLINICAL DATA:  Shortness of breath and weakness. EXAM: PORTABLE CHEST 1 VIEW COMPARISON:  07/07/2020 FINDINGS: Heart size is stable but enlarged. The patient is status post prior median sternotomy. There is a well-positioned tunneled dialysis catheter on the right. There is stable bibasilar atelectasis and scarring. There is no pneumothorax. No large pleural effusion. No acute osseous abnormality. IMPRESSION: No acute abnormality. Electronically Signed   By: Constance Holster M.D.   On: 10/13/2020 19:19   ECHOCARDIOGRAM LIMITED  Result Date: 10/14/2020    ECHOCARDIOGRAM REPORT   Patient Name:   Victor Schultz Date of Exam: 10/14/2020 Medical Rec #:  [id number]    Height:       64.0 in Accession #:    [id number]   Weight:       156.5 lb Date of Birth:  1950-11-11     BSA:          1.763 m Patient Age:    75 years     BP:           133/67 mmHg Patient Gender: M  HR:           92 bpm. Exam  Location:  Inpatient Procedure: Limited Echo, Cardiac Doppler and Color Doppler Indications:    Elvated troponin  History:        Patient has no prior history of Echocardiogram examinations.                 CAD, Prior CABG; Risk Factors:Diabetes, Hypertension,                 Dyslipidemia and Former Smoker. COVID-19. ESRD.  Sonographer:    Clayton Lefort RDCS (AE) Referring Phys: [id number] [facility]  Sonographer Comments: Suboptimal parasternal window. IMPRESSIONS  1. Left ventricular ejection fraction, by estimation, is 55 to 60%. The left ventricle has normal function. The left ventricle has no regional wall motion abnormalities. There is moderate left ventricular hypertrophy. Left ventricular diastolic parameters are indeterminate.  2. Right ventricular systolic function is normal. The right ventricular size is normal. There is normal pulmonary artery systolic pressure.  3. The mitral valve is grossly normal. Mild to moderate mitral valve regurgitation.  4. The aortic valve is normal in structure. Aortic valve regurgitation is trivial. FINDINGS  Left Ventricle: Left ventricular ejection fraction, by estimation, is 55 to 60%. The left ventricle has normal function. The left ventricle has no regional wall motion abnormalities. The left ventricular internal cavity size was normal in size. There is  moderate left ventricular hypertrophy. Left ventricular diastolic parameters are indeterminate. Right Ventricle: The right ventricular size is normal. No increase in right ventricular wall thickness. Right ventricular systolic function is normal. There is normal pulmonary artery systolic pressure. The tricuspid regurgitant velocity is 2.40 m/s, and  with an assumed right atrial pressure of 8 mmHg, the estimated right ventricular systolic pressure is 69.6 mmHg. Left Atrium: Left atrial size was normal in size. Right Atrium: Right atrial size was normal in size. Pericardium: There is no evidence of pericardial effusion.  Mitral Valve: The mitral valve is grossly normal. Mild to moderate mitral valve regurgitation. Tricuspid Valve: The tricuspid valve is grossly normal. Tricuspid valve regurgitation is trivial. Aortic Valve: The aortic valve is normal in structure. Aortic valve regurgitation is trivial. Pulmonic Valve: The pulmonic valve was normal in structure. Pulmonic valve regurgitation is not visualized. Aorta: The aortic root and ascending aorta are structurally normal, with no evidence of dilitation. IAS/Shunts: The atrial septum is grossly normal.  LEFT VENTRICLE PLAX 2D LVIDd:         4.80 cm LVIDs:         3.30 cm LV PW:         1.80 cm LV IVS:        1.50 cm LVOT diam:     2.40 cm LVOT Area:     4.52 cm  IVC IVC diam: 1.40 cm LEFT ATRIUM         Index LA diam:    3.80 cm 2.16 cm/m   AORTA Ao Root diam: 3.20 cm TRICUSPID VALVE TR Peak grad:   23.0 mmHg TR Vmax:        240.00 cm/s  SHUNTS Systemic Diam: 2.40 cm Mertie Moores MD Electronically signed by Mertie Moores MD Signature Date/Time: 10/14/2020/1:53:15 PM    Final      TODAY-DAY OF DISCHARGE:  Subjective:   [name] today has no headache,no chest abdominal pain,no new weakness tingling or numbness, feels much better wants to go home today.   Objective:   Blood pressure (!) 143/73,  pulse 90, temperature 98 F (36.7 C), resp. rate (!) 24, height 5\' 4"  (1.626 m), weight 69.5 kg, SpO2 98 %.  Intake/Output Summary (Last 24 hours) at 10/16/2020 1349 Last data filed at 10/16/2020 1155 Gross per 24 hour  Intake 102.29 ml  Output 2000 ml  Net -1897.71 ml   Filed Weights   10/13/20 1829 10/16/20 0850 10/16/20 1155  Weight: 71 kg 71.5 kg 69.5 kg    Exam: Awake Alert, Oriented *3, No new F.N deficits, Normal affect [place].AT,PERRAL Supple Neck,No JVD, No cervical lymphadenopathy appriciated.  Symmetrical Chest wall movement, Good air movement bilaterally, CTAB RRR,No Gallops,Rubs or new Murmurs, No Parasternal Heave +ve B.Sounds, Abd Soft, Non  tender, No organomegaly appriciated, No rebound -guarding or rigidity. No Cyanosis, Clubbing or edema, No new Rash or bruise   PERTINENT RADIOLOGIC STUDIES: CT Angio Chest PE W/Cm &/Or Wo Cm  Result Date: 10/13/2020 CLINICAL DATA:  PE suspected, high prob Shortness of breath and weakness. EXAM: CT ANGIOGRAPHY CHEST WITH CONTRAST TECHNIQUE: Multidetector CT imaging of the chest was performed using the standard protocol during bolus administration of intravenous contrast. Multiplanar CT image reconstructions and MIPs were obtained to evaluate the vascular anatomy. CONTRAST:  45mL OMNIPAQUE IOHEXOL 350 MG/ML SOLN COMPARISON:  PET CT 09/07/2020. FINDINGS: Cardiovascular: There are no filling defects within the pulmonary arteries to suggest pulmonary embolus. Right-sided dialysis catheter remains in place. Atherosclerosis of the thoracic aorta with tortuosity. No aortic dissection or acute aortic findings. Multi chamber cardiomegaly. Post CABG with dense calcification of native coronary arteries. No pericardial effusion. Mediastinum/Nodes: No enlarged mediastinal or hilar lymph nodes. Small hiatal hernia. Small amount of low-density fluid adjacent to the distal esophagus likely residual tracking ascites, improved from prior exam. No visualized thyroid nodule. Lungs/Pleura: Improved right pleural effusion from prior exam. Trace left pleural effusion is unchanged. Anterior left lower lobe pulmonary nodule measures 2.1 x 1.4 cm, unchanged from recent PET. Subpleural reticulation and areas of honeycombing are unchanged from prior exam. No acute airspace disease. Scattered calcified granuloma. Upper Abdomen: Small volume abdominal ascites. No acute upper abdominal findings. Musculoskeletal: Median sternotomy. Faint sclerotic density within T10 is unchanged from prior imaging, not hypermetabolic on prior PET. Mild chronic loss of height of upper thoracic vertebra. No acute osseous abnormalities are seen. Review of the  MIP images confirms the above findings. IMPRESSION: 1. No pulmonary embolus or acute intrathoracic abnormality. 2. Improved right pleural effusion from prior exam. Trace left pleural effusion is unchanged. 3. Unchanged left lower lobe pulmonary nodule. 4. Grossly stable interstitial lung disease allowing for motion on the current exam. Aortic Atherosclerosis (ICD10-I70.0). Electronically Signed   By: Keith Rake M.D.   On: 10/13/2020 21:43   DG Chest Port 1 View  Result Date: 10/13/2020 CLINICAL DATA:  Shortness of breath and weakness. EXAM: PORTABLE CHEST 1 VIEW COMPARISON:  07/07/2020 FINDINGS: Heart size is stable but enlarged. The patient is status post prior median sternotomy. There is a well-positioned tunneled dialysis catheter on the right. There is stable bibasilar atelectasis and scarring. There is no pneumothorax. No large pleural effusion. No acute osseous abnormality. IMPRESSION: No acute abnormality. Electronically Signed   By: Constance Holster M.D.   On: 10/13/2020 19:19   ECHOCARDIOGRAM LIMITED  Result Date: 10/14/2020    ECHOCARDIOGRAM REPORT   Patient Name:   DAWSEN KRIEGER Date of Exam: 10/14/2020 Medical Rec #:  [id number]    Height:       64.0 in Accession #:    [id number]  Weight:       156.5 lb Date of Birth:  [birth year]     BSA:          1.763 m Patient Age:    26 years     BP:           133/67 mmHg Patient Gender: M            HR:           92 bpm. Exam Location:  Inpatient Procedure: Limited Echo, Cardiac Doppler and Color Doppler Indications:    Elvated troponin  History:        Patient has no prior history of Echocardiogram examinations.                 CAD, Prior CABG; Risk Factors:Diabetes, Hypertension,                 Dyslipidemia and Former Smoker. COVID-19. ESRD.  Sonographer:    Clayton Lefort RDCS (AE) Referring Phys: [id number] [facility]  Sonographer Comments: Suboptimal parasternal window. IMPRESSIONS  1. Left ventricular ejection fraction, by estimation, is 55 to 60%.  The left ventricle has normal function. The left ventricle has no regional wall motion abnormalities. There is moderate left ventricular hypertrophy. Left ventricular diastolic parameters are indeterminate.  2. Right ventricular systolic function is normal. The right ventricular size is normal. There is normal pulmonary artery systolic pressure.  3. The mitral valve is grossly normal. Mild to moderate mitral valve regurgitation.  4. The aortic valve is normal in structure. Aortic valve regurgitation is trivial. FINDINGS  Left Ventricle: Left ventricular ejection fraction, by estimation, is 55 to 60%. The left ventricle has normal function. The left ventricle has no regional wall motion abnormalities. The left ventricular internal cavity size was normal in size. There is  moderate left ventricular hypertrophy. Left ventricular diastolic parameters are indeterminate. Right Ventricle: The right ventricular size is normal. No increase in right ventricular wall thickness. Right ventricular systolic function is normal. There is normal pulmonary artery systolic pressure. The tricuspid regurgitant velocity is 2.40 m/s, and  with an assumed right atrial pressure of 8 mmHg, the estimated right ventricular systolic pressure is 67.5 mmHg. Left Atrium: Left atrial size was normal in size. Right Atrium: Right atrial size was normal in size. Pericardium: There is no evidence of pericardial effusion. Mitral Valve: The mitral valve is grossly normal. Mild to moderate mitral valve regurgitation. Tricuspid Valve: The tricuspid valve is grossly normal. Tricuspid valve regurgitation is trivial. Aortic Valve: The aortic valve is normal in structure. Aortic valve regurgitation is trivial. Pulmonic Valve: The pulmonic valve was normal in structure. Pulmonic valve regurgitation is not visualized. Aorta: The aortic root and ascending aorta are structurally normal, with no evidence of dilitation. IAS/Shunts: The atrial septum is grossly  normal.  LEFT VENTRICLE PLAX 2D LVIDd:         4.80 cm LVIDs:         3.30 cm LV PW:         1.80 cm LV IVS:        1.50 cm LVOT diam:     2.40 cm LVOT Area:     4.52 cm  IVC IVC diam: 1.40 cm LEFT ATRIUM         Index LA diam:    3.80 cm 2.16 cm/m   AORTA Ao Root diam: 3.20 cm TRICUSPID VALVE TR Peak grad:   23.0 mmHg TR Vmax:  240.00 cm/s  SHUNTS Systemic Diam: 2.40 cm Mertie Moores MD Electronically signed by Mertie Moores MD Signature Date/Time: 10/14/2020/1:53:15 PM    Final      PERTINENT LAB RESULTS: CBC: Recent Labs    10/15/20 0348 10/16/20 0154  WBC 5.0 4.1  HGB 8.8* 8.6*  HCT 26.5* 27.5*  PLT 182 183   CMET CMP     Component Value Date/Time   NA 137 10/16/2020 0154   NA 141 09/22/2020 0000   K 4.0 10/16/2020 0154   CL 105 10/16/2020 0154   CO2 20 (L) 10/16/2020 0154   GLUCOSE 120 (H) 10/16/2020 0154   BUN 39 (H) 10/16/2020 0154   BUN 18 09/22/2020 0000   CREATININE 6.57 (H) 10/16/2020 0154   CALCIUM 7.5 (L) 10/16/2020 0154   PROT 6.2 (L) 10/16/2020 0154   PROT 6.4 04/05/2020 0935   ALBUMIN 2.1 (L) 10/16/2020 0154   ALBUMIN 2.5 (L) 04/05/2020 0935   AST 28 10/16/2020 0154   ALT 16 10/16/2020 0154   ALKPHOS 109 10/16/2020 0154   BILITOT 0.4 10/16/2020 0154   BILITOT 0.5 04/05/2020 0935   GFRNONAA 8 (L) 10/16/2020 0154   GFRAA 16 (L) 05/02/2020 1034    GFR Estimated Creatinine Clearance: 8.8 mL/min (A) (by C-G formula based on SCr of 6.57 mg/dL (H)). No results for input(s): LIPASE, AMYLASE in the last 72 hours. No results for input(s): CKTOTAL, CKMB, CKMBINDEX, TROPONINI in the last 72 hours. Invalid input(s): POCBNP Recent Labs    10/15/20 0348 10/16/20 0154  DDIMER 1.36* 1.16*   Recent Labs    10/14/20 0737  HGBA1C 5.3   Recent Labs    10/13/20 2218  TRIG 74   No results for input(s): TSH, T4TOTAL, T3FREE, THYROIDAB in the last 72 hours.  Invalid input(s): FREET3 Recent Labs    10/13/20 2216  FERRITIN 534*   Coags: No results  for input(s): INR in the last 72 hours.  Invalid input(s): PT Microbiology: Recent Results (from the past 240 hour(s))  Resp Panel by RT-PCR (Flu A&B, Covid) Nasopharyngeal Swab     Status: Abnormal   Collection Time: 10/13/20  6:58 PM   Specimen: Nasopharyngeal Swab; Nasopharyngeal(NP) swabs in vial transport medium  Result Value Ref Range Status   SARS Coronavirus 2 by RT PCR POSITIVE (A) NEGATIVE Final    Comment: RESULT CALLED TO, READ BACK BY AND VERIFIED WITH: A OLEARY RN 2104 10/13/20 A BROWNING (NOTE) SARS-CoV-2 target nucleic acids are DETECTED.  The SARS-CoV-2 RNA is generally detectable in upper respiratory specimens during the acute phase of infection. Positive results are indicative of the presence of the identified virus, but do not rule out bacterial infection or co-infection with other pathogens not detected by the test. Clinical correlation with patient history and other diagnostic information is necessary to determine patient infection status. The expected result is Negative.  Fact Sheet for Patients: EntrepreneurPulse.com.au  Fact Sheet for Healthcare Providers: IncredibleEmployment.be  This test is not yet approved or cleared by the [place] FDA and  has been authorized for detection and/or diagnosis of SARS-CoV-2 by FDA under an Emergency Use Authorization (EUA).  This EUA will remain in effect (meaning this test can b e used) for the duration of  the COVID-19 declaration under Section 564(b)(1) of the Act, 21 U.S.C. section 360bbb-3(b)(1), unless the authorization is terminated or revoked sooner.     Influenza A by PCR NEGATIVE NEGATIVE Final   Influenza B by PCR NEGATIVE NEGATIVE Final  Comment: (NOTE) The Xpert Xpress SARS-CoV-2/FLU/RSV plus assay is intended as an aid in the diagnosis of influenza from Nasopharyngeal swab specimens and should not be used as a sole basis for treatment. Nasal washings  and aspirates are unacceptable for Xpert Xpress SARS-CoV-2/FLU/RSV testing.  Fact Sheet for Patients: EntrepreneurPulse.com.au  Fact Sheet for Healthcare Providers: IncredibleEmployment.be  This test is not yet approved or cleared by the [place] FDA and has been authorized for detection and/or diagnosis of SARS-CoV-2 by FDA under an Emergency Use Authorization (EUA). This EUA will remain in effect (meaning this test can be used) for the duration of the COVID-19 declaration under Section 564(b)(1) of the Act, 21 U.S.C. section 360bbb-3(b)(1), unless the authorization is terminated or revoked.  Performed at [facility], [place] [address], [place], [place] [address]   Culture, blood (routine x 2)     Status: None (Preliminary result)   Collection Time: 10/13/20  7:22 PM   Specimen: BLOOD  Result Value Ref Range Status   Specimen Description BLOOD BLOOD LEFT FOREARM  Final   Special Requests   Final    BOTTLES DRAWN AEROBIC AND ANAEROBIC Blood Culture adequate volume   Culture   Final    NO GROWTH 3 DAYS Performed at [facility], [place] [address] [place], [place] [address]    Report Status PENDING  Incomplete  Culture, blood (routine x 2)     Status: None (Preliminary result)   Collection Time: 10/13/20  8:03 PM   Specimen: BLOOD  Result Value Ref Range Status   Specimen Description BLOOD RIGHT ANTECUBITAL  Final   Special Requests   Final    BOTTLES DRAWN AEROBIC AND ANAEROBIC Blood Culture results may not be optimal due to an inadequate volume of blood received in culture bottles   Culture   Final    NO GROWTH 3 DAYS Performed at [facility], [place] [address] [place], [place] [address]    Report Status PENDING  Incomplete    FURTHER DISCHARGE INSTRUCTIONS:  Get Medicines reviewed and adjusted: Please take all your medications with you for your next visit with your Primary MD  Laboratory/radiological  data: Please request your Primary MD to go over all hospital tests and procedure/radiological results at the follow up, please ask your Primary MD to get all Hospital records sent to his/her office.  In some cases, they will be blood work, cultures and biopsy results pending at the time of your discharge. Please request that your primary care M.D. goes through all the records of your hospital data and follows up on these results.  Also Note the following: If you experience worsening of your admission symptoms, develop shortness of breath, life threatening emergency, suicidal or homicidal thoughts you must seek medical attention immediately by calling 911 or calling your MD immediately  if symptoms less severe.  You must read complete instructions/literature along with all the possible adverse reactions/side effects for all the Medicines you take and that have been prescribed to you. Take any new Medicines after you have completely understood and accpet all the possible adverse reactions/side effects.   Do not drive when taking Pain medications or sleeping medications (Benzodaizepines)  Do not take more than prescribed Pain, Sleep and Anxiety Medications. It is not advisable to combine anxiety,sleep and pain medications without talking with your primary care practitioner  Special Instructions: If you have smoked or chewed Tobacco  in the last 2 yrs please stop smoking, stop any regular  Alcohol  and or any Recreational drug use.  Wear Seat belts while driving.  Please note: You were cared for by a hospitalist during your hospital stay. Once you are discharged, your primary care physician will handle any further medical issues. Please note that NO REFILLS for any discharge medications will be authorized once you are discharged, as it is imperative that you return to your primary care physician (or establish a relationship with a primary care physician if you do not have one) for your post hospital  discharge needs so that they can reassess your need for medications and monitor your lab values.  Total Time spent coordinating discharge including counseling, education and face to face time equals 35 minutes.  SignedOren Binet 10/16/2020 1:49 PM

## 2020-10-16 NOTE — Evaluation (Signed)
Occupational Therapy Evaluation Patient Details Name: Victor Schultz MRN: [id number] DOB: 10-Oct-1950 Today's Date: 10/16/2020    History of Present Illness 70 y.o. male with medical history significant of ESRD on HD TTS, insulin-dependent type II diabetes, hypertension, hyperlipidemia, CAD, hypothyroidism presented to the ED with complaints of cough, fever, and generalized weakness. COVID+ CT angiogram neg for PE   Clinical Impression   This 70 y/o male presents with the above. PTA pt reports being independent with ADL and functional mobility. Today pt performing ADL and mobility tasks overall at supervision level. Pt mobilizing in hallway without LOB or assist required. He does endorse some fatigue with hallway level activity but with no significant DOE noted, SpO2 96% after return to room, completing session on RA throughout. Issued pt energy conservation handout and further educated/reviewed during session as pt anticipating d/c home today. Pt verbalizing understanding and asking appropriate questions. Given pt to d/c today acute OT to sign off at this time. No OT needs identified after discharge at this time.     Follow Up Recommendations  No OT follow up;Supervision - Intermittent (increased supervision initially after d/c)    Equipment Recommendations  None recommended by OT (pt's DME needs are met)           Precautions / Restrictions Precautions Precautions: None Restrictions Weight Bearing Restrictions: No      Mobility Bed Mobility Overal bed mobility: Independent                  Transfers Overall transfer level: Needs assistance Equipment used: None Transfers: Sit to/from Stand Sit to Stand: Supervision         General transfer comment: for safety, no LOB noted or physical assist required    Balance Overall balance assessment: No apparent balance deficits (not formally assessed)                                         ADL either  performed or assessed with clinical judgement   ADL Overall ADL's : Needs assistance/impaired                                       General ADL Comments: pt demonstrating mobility in room and hallway without AD at supervision level, demonstrating figure 4 for LB ADL without difficulty. pt dressed upon arrival and reports no concerns with completing ADL tasks after return home. issued energy conservation handout and reviewed during session with pt verbalizing understanding. he does endorse some fatigue/increased WOB with activity but no significant dyspnea noted                         Pertinent Vitals/Pain Pain Assessment: No/denies pain     Hand Dominance     Extremity/Trunk Assessment Upper Extremity Assessment Upper Extremity Assessment: Overall WFL for tasks assessed   Lower Extremity Assessment Lower Extremity Assessment: Defer to PT evaluation;Overall [facility] for tasks assessed   Cervical / Trunk Assessment Cervical / Trunk Assessment: Normal   Communication Communication Communication: No difficulties   Cognition Arousal/Alertness: Awake/alert Behavior During Therapy: WFL for tasks assessed/performed Overall Cognitive Status: Within Functional Limits for tasks assessed  General Comments  SpO2 96% end of mobility tasks on RA    Exercises     Shoulder Instructions      Home Living Family/patient expects to be discharged to:: Private residence Living Arrangements: Alone Available Help at Discharge: Family Type of Home: House Home Access: Ramped entrance     Home Layout: One level     Bathroom Shower/Tub: Occupational psychologist: Standard     Home Equipment: Shower seat - built in;Grab bars - tub/shower;Bedside commode          Prior Functioning/Environment Level of Independence: Independent                 OT Problem List: Decreased activity  tolerance;Cardiopulmonary status limiting activity;Decreased strength      OT Treatment/Interventions:      OT Goals(Current goals can be found in the care plan section) Acute Rehab OT Goals Patient Stated Goal: none stated OT Goal Formulation: All assessment and education complete, DC therapy  OT Frequency:     Barriers to D/C:            Co-evaluation              AM-PAC OT "6 Clicks" Daily Activity     Outcome Measure Help from another person eating meals?: None Help from another person taking care of personal grooming?: A Little Help from another person toileting, which includes using toliet, bedpan, or urinal?: A Little Help from another person bathing (including washing, rinsing, drying)?: A Little Help from another person to put on and taking off regular upper body clothing?: None Help from another person to put on and taking off regular lower body clothing?: A Little 6 Click Score: 20   End of Session Nurse Communication: Mobility status  Activity Tolerance: Patient tolerated treatment well Patient left: in bed;with call bell/phone within reach  OT Visit Diagnosis: Muscle weakness (generalized) (M62.81);Other (comment) (decreased activity tolerance)                Time: 9483-4758 OT Time Calculation (min): 17 min Charges:  OT General Charges $OT Visit: 1 Visit OT Evaluation $OT Eval Moderate Complexity: [place], OT Acute Rehabilitation Services Pager [phone] Office [place] 10/16/2020, 4:00 PM

## 2020-10-16 NOTE — Progress Notes (Signed)
Patient discharge intstructions given along with printed avs summary. Medication changes acknowledged. Patient has no additional questions.Discharged home with family member.

## 2020-10-16 NOTE — Progress Notes (Signed)
OT Cancellation Note  Patient Details Name: Victor Schultz MRN: [id number] DOB: 04-30-1951   Cancelled Treatment:    Reason Eval/Treat Not Completed: Patient at procedure or test/ unavailable; (pt/bed currently out of the room). Will follow up for OT eval as able.  Lou Cal, OT Acute Rehabilitation Services Pager [phone] Office [phone]   Raymondo Band 10/16/2020, 9:08 AM

## 2020-10-16 NOTE — Plan of Care (Signed)
VSS. Remains on room air. OOB to BR. Dialysis today. Possible discharge.   Problem: Education: Goal: Knowledge of risk factors and measures for prevention of condition will improve Outcome: Progressing   Problem: Respiratory: Goal: Will maintain a patent airway Outcome: Progressing

## 2020-10-17 ENCOUNTER — Telehealth: Payer: Self-pay | Admitting: *Deleted

## 2020-10-17 ENCOUNTER — Telehealth: Payer: Self-pay | Admitting: Nephrology

## 2020-10-17 DIAGNOSIS — D509 Iron deficiency anemia, unspecified: Secondary | ICD-10-CM | POA: Diagnosis not present

## 2020-10-17 DIAGNOSIS — U071 COVID-19: Secondary | ICD-10-CM | POA: Diagnosis not present

## 2020-10-17 DIAGNOSIS — Z992 Dependence on renal dialysis: Secondary | ICD-10-CM | POA: Diagnosis not present

## 2020-10-17 DIAGNOSIS — N2581 Secondary hyperparathyroidism of renal origin: Secondary | ICD-10-CM | POA: Diagnosis not present

## 2020-10-17 DIAGNOSIS — E876 Hypokalemia: Secondary | ICD-10-CM | POA: Diagnosis not present

## 2020-10-17 DIAGNOSIS — N186 End stage renal disease: Secondary | ICD-10-CM | POA: Diagnosis not present

## 2020-10-17 NOTE — Telephone Encounter (Signed)
Transition of Care Contact from [place]   Date of Discharge: 10/16/20 Date of Contact: 10/17/20 Method of contact: phone Talked to patient   Patient contacted to discuss transition of care form recent hospitaliztion. Patient was admitted to [facility] from 10/13/20 to 10/16/20 with the discharge diagnosis of COVID pneumonia.     Medication changes were reviewed.   Patient will follow up with is outpatient dialysis center today 10/17/20.   Other follow up needs include none identified.    Jen Mow, PA-C [facility] Pager: [phone]

## 2020-10-17 NOTE — Telephone Encounter (Signed)
Patient was notified of cancellation of appointment due to Covid. Rescheduled for 11/01/20 with Dr. Lisbeth Renshaw.

## 2020-10-18 ENCOUNTER — Ambulatory Visit: Payer: Medicare Other | Admitting: Radiation Oncology

## 2020-10-18 ENCOUNTER — Ambulatory Visit: Payer: Medicare Other

## 2020-10-18 LAB — CULTURE, BLOOD (ROUTINE X 2)
Culture: NO GROWTH
Culture: NO GROWTH
Special Requests: ADEQUATE

## 2020-10-19 DIAGNOSIS — N2581 Secondary hyperparathyroidism of renal origin: Secondary | ICD-10-CM | POA: Diagnosis not present

## 2020-10-19 DIAGNOSIS — Z992 Dependence on renal dialysis: Secondary | ICD-10-CM | POA: Diagnosis not present

## 2020-10-19 DIAGNOSIS — D509 Iron deficiency anemia, unspecified: Secondary | ICD-10-CM | POA: Diagnosis not present

## 2020-10-19 DIAGNOSIS — U071 COVID-19: Secondary | ICD-10-CM | POA: Diagnosis not present

## 2020-10-19 DIAGNOSIS — E1122 Type 2 diabetes mellitus with diabetic chronic kidney disease: Secondary | ICD-10-CM | POA: Diagnosis not present

## 2020-10-19 DIAGNOSIS — N186 End stage renal disease: Secondary | ICD-10-CM | POA: Diagnosis not present

## 2020-10-19 DIAGNOSIS — E876 Hypokalemia: Secondary | ICD-10-CM | POA: Diagnosis not present

## 2020-10-20 ENCOUNTER — Encounter (HOSPITAL_COMMUNITY): Payer: Medicare Other

## 2020-10-20 ENCOUNTER — Other Ambulatory Visit (HOSPITAL_COMMUNITY): Payer: Medicare Other

## 2020-10-20 ENCOUNTER — Encounter: Payer: Medicare Other | Admitting: Vascular Surgery

## 2020-10-20 DIAGNOSIS — D631 Anemia in chronic kidney disease: Secondary | ICD-10-CM | POA: Diagnosis not present

## 2020-10-20 DIAGNOSIS — E1122 Type 2 diabetes mellitus with diabetic chronic kidney disease: Secondary | ICD-10-CM | POA: Diagnosis not present

## 2020-10-20 DIAGNOSIS — N400 Enlarged prostate without lower urinary tract symptoms: Secondary | ICD-10-CM | POA: Diagnosis not present

## 2020-10-20 DIAGNOSIS — N186 End stage renal disease: Secondary | ICD-10-CM | POA: Diagnosis not present

## 2020-10-20 DIAGNOSIS — J441 Chronic obstructive pulmonary disease with (acute) exacerbation: Secondary | ICD-10-CM | POA: Diagnosis not present

## 2020-10-20 DIAGNOSIS — I120 Hypertensive chronic kidney disease with stage 5 chronic kidney disease or end stage renal disease: Secondary | ICD-10-CM | POA: Diagnosis not present

## 2020-10-21 DIAGNOSIS — Z992 Dependence on renal dialysis: Secondary | ICD-10-CM | POA: Diagnosis not present

## 2020-10-21 DIAGNOSIS — N186 End stage renal disease: Secondary | ICD-10-CM | POA: Diagnosis not present

## 2020-10-21 DIAGNOSIS — E876 Hypokalemia: Secondary | ICD-10-CM | POA: Diagnosis not present

## 2020-10-21 DIAGNOSIS — N2581 Secondary hyperparathyroidism of renal origin: Secondary | ICD-10-CM | POA: Diagnosis not present

## 2020-10-21 DIAGNOSIS — U071 COVID-19: Secondary | ICD-10-CM | POA: Diagnosis not present

## 2020-10-21 DIAGNOSIS — D509 Iron deficiency anemia, unspecified: Secondary | ICD-10-CM | POA: Diagnosis not present

## 2020-10-24 DIAGNOSIS — U071 COVID-19: Secondary | ICD-10-CM | POA: Diagnosis not present

## 2020-10-24 DIAGNOSIS — E876 Hypokalemia: Secondary | ICD-10-CM | POA: Diagnosis not present

## 2020-10-24 DIAGNOSIS — N186 End stage renal disease: Secondary | ICD-10-CM | POA: Diagnosis not present

## 2020-10-24 DIAGNOSIS — N2581 Secondary hyperparathyroidism of renal origin: Secondary | ICD-10-CM | POA: Diagnosis not present

## 2020-10-24 DIAGNOSIS — D509 Iron deficiency anemia, unspecified: Secondary | ICD-10-CM | POA: Diagnosis not present

## 2020-10-24 DIAGNOSIS — Z992 Dependence on renal dialysis: Secondary | ICD-10-CM | POA: Diagnosis not present

## 2020-10-25 DIAGNOSIS — J441 Chronic obstructive pulmonary disease with (acute) exacerbation: Secondary | ICD-10-CM | POA: Diagnosis not present

## 2020-10-25 DIAGNOSIS — E1122 Type 2 diabetes mellitus with diabetic chronic kidney disease: Secondary | ICD-10-CM | POA: Diagnosis not present

## 2020-10-25 DIAGNOSIS — D631 Anemia in chronic kidney disease: Secondary | ICD-10-CM | POA: Diagnosis not present

## 2020-10-25 DIAGNOSIS — N186 End stage renal disease: Secondary | ICD-10-CM | POA: Diagnosis not present

## 2020-10-25 DIAGNOSIS — N400 Enlarged prostate without lower urinary tract symptoms: Secondary | ICD-10-CM | POA: Diagnosis not present

## 2020-10-25 DIAGNOSIS — I120 Hypertensive chronic kidney disease with stage 5 chronic kidney disease or end stage renal disease: Secondary | ICD-10-CM | POA: Diagnosis not present

## 2020-10-26 DIAGNOSIS — Z992 Dependence on renal dialysis: Secondary | ICD-10-CM | POA: Diagnosis not present

## 2020-10-26 DIAGNOSIS — U071 COVID-19: Secondary | ICD-10-CM | POA: Diagnosis not present

## 2020-10-26 DIAGNOSIS — D509 Iron deficiency anemia, unspecified: Secondary | ICD-10-CM | POA: Diagnosis not present

## 2020-10-26 DIAGNOSIS — N186 End stage renal disease: Secondary | ICD-10-CM | POA: Diagnosis not present

## 2020-10-26 DIAGNOSIS — E876 Hypokalemia: Secondary | ICD-10-CM | POA: Diagnosis not present

## 2020-10-26 DIAGNOSIS — N2581 Secondary hyperparathyroidism of renal origin: Secondary | ICD-10-CM | POA: Diagnosis not present

## 2020-10-28 DIAGNOSIS — N186 End stage renal disease: Secondary | ICD-10-CM | POA: Diagnosis not present

## 2020-10-28 DIAGNOSIS — Z992 Dependence on renal dialysis: Secondary | ICD-10-CM | POA: Diagnosis not present

## 2020-10-28 DIAGNOSIS — D509 Iron deficiency anemia, unspecified: Secondary | ICD-10-CM | POA: Diagnosis not present

## 2020-10-28 DIAGNOSIS — N2581 Secondary hyperparathyroidism of renal origin: Secondary | ICD-10-CM | POA: Diagnosis not present

## 2020-10-28 DIAGNOSIS — E876 Hypokalemia: Secondary | ICD-10-CM | POA: Diagnosis not present

## 2020-10-28 DIAGNOSIS — U071 COVID-19: Secondary | ICD-10-CM | POA: Diagnosis not present

## 2020-10-30 DIAGNOSIS — J4520 Mild intermittent asthma, uncomplicated: Secondary | ICD-10-CM | POA: Diagnosis not present

## 2020-10-30 DIAGNOSIS — R911 Solitary pulmonary nodule: Secondary | ICD-10-CM | POA: Diagnosis not present

## 2020-10-31 DIAGNOSIS — I2510 Atherosclerotic heart disease of native coronary artery without angina pectoris: Secondary | ICD-10-CM | POA: Diagnosis not present

## 2020-10-31 DIAGNOSIS — E876 Hypokalemia: Secondary | ICD-10-CM | POA: Diagnosis not present

## 2020-10-31 DIAGNOSIS — Z992 Dependence on renal dialysis: Secondary | ICD-10-CM | POA: Diagnosis not present

## 2020-10-31 DIAGNOSIS — E1165 Type 2 diabetes mellitus with hyperglycemia: Secondary | ICD-10-CM | POA: Diagnosis not present

## 2020-10-31 DIAGNOSIS — E785 Hyperlipidemia, unspecified: Secondary | ICD-10-CM | POA: Diagnosis not present

## 2020-10-31 DIAGNOSIS — D631 Anemia in chronic kidney disease: Secondary | ICD-10-CM | POA: Diagnosis not present

## 2020-10-31 DIAGNOSIS — N2581 Secondary hyperparathyroidism of renal origin: Secondary | ICD-10-CM | POA: Diagnosis not present

## 2020-10-31 DIAGNOSIS — N179 Acute kidney failure, unspecified: Secondary | ICD-10-CM | POA: Diagnosis not present

## 2020-10-31 DIAGNOSIS — J449 Chronic obstructive pulmonary disease, unspecified: Secondary | ICD-10-CM | POA: Diagnosis not present

## 2020-10-31 DIAGNOSIS — E039 Hypothyroidism, unspecified: Secondary | ICD-10-CM | POA: Diagnosis not present

## 2020-10-31 DIAGNOSIS — N186 End stage renal disease: Secondary | ICD-10-CM | POA: Diagnosis not present

## 2020-10-31 DIAGNOSIS — K219 Gastro-esophageal reflux disease without esophagitis: Secondary | ICD-10-CM | POA: Diagnosis not present

## 2020-10-31 DIAGNOSIS — U071 COVID-19: Secondary | ICD-10-CM | POA: Diagnosis not present

## 2020-10-31 DIAGNOSIS — I120 Hypertensive chronic kidney disease with stage 5 chronic kidney disease or end stage renal disease: Secondary | ICD-10-CM | POA: Diagnosis not present

## 2020-10-31 DIAGNOSIS — D509 Iron deficiency anemia, unspecified: Secondary | ICD-10-CM | POA: Diagnosis not present

## 2020-10-31 DIAGNOSIS — E1122 Type 2 diabetes mellitus with diabetic chronic kidney disease: Secondary | ICD-10-CM | POA: Diagnosis not present

## 2020-10-31 NOTE — Progress Notes (Signed)
Thoracic Location of Tumor / Histology: Left Lung Nodule  Patient presented with dry cough and SOB with/without exertion.  He was having pain in right side and shoulder due to fall he had.    CTA 10/13/2020: Improved right pleural effusion from prior exam. Trace left pleural effusion is unchanged. Anterior left lower lobe pulmonary nodule measures 2.1 x 1.4 cm, unchanged from recent PET.  PET 09/07/2020:The lobulated nodule of concern along the diaphragmatic surface of the left lower lobe measures 1.8 x 1.7 cm on image 39/8 and is hypermetabolic with an SUV max of 4.4. No other hypermetabolic or suspicious pulmonary nodules are identified.  Intense hypermetabolic activity associated with the superior aspect of the left ear associated with soft tissue thickening on the CT, suspicious for neoplasm.  CT Chest 07/03/2020: Bibasilar scarring, stable.  No new opacity evident. Stable cardiac silhouette. Postoperative changes noted.   Biopsies: None  Tobacco/Marijuana/Snuff/ETOH use: Former smoker, quit November 2021  Past/Anticipated interventions by cardiothoracic surgery, if any:   Past/Anticipated interventions by medical oncology, if any:  Dr. Hinton Rao 09/22/2020 - He was recently diagnosed with a left lower lobe pulmonary nodule measuring 1.8 x 1.7 cm which is hypermetabolic on PET imaging. -Surgical resection, could be considered, but this would be a high risk and invasive procedure.   -As he is on dialysis and this appears to be a stage I cancer, I do not recommend chemotherapy, but he may be a candidate for stereotactic radiation. -If he does not wish to be aggressive, we could pursue surveillance and continue to monitor the nodule with routine CT imaging if he chooses that, I would repeat his next CT in 3 months.   Signs/Symptoms  Weight changes, if any: Has dropped a few pounds over the past couple of months.  Respiratory complaints, if any: Continues to have SOB with increased activities.    Hemoptysis, if any:  Occasional non-productive cough.  Pain issues, if any:  No  SAFETY ISSUES:  Prior radiation? No  Pacemaker/ICD? No  Possible current pregnancy? n/a  Is the patient on methotrexate? 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

## 2020-11-01 ENCOUNTER — Encounter: Payer: Self-pay | Admitting: Radiation Oncology

## 2020-11-01 ENCOUNTER — Ambulatory Visit
Admission: RE | Admit: 2020-11-01 | Discharge: 2020-11-01 | Disposition: A | Discharge status: Home / Self Care | Payer: Medicare Other | Source: Ambulatory Visit | Attending: Radiation Oncology | Admitting: Radiation Oncology

## 2020-11-01 ENCOUNTER — Other Ambulatory Visit: Payer: Self-pay

## 2020-11-01 VITALS — BP 152/63 | HR 115 | Temp 97.0°F | Resp 18 | Ht 64.0 in | Wt 151.4 lb

## 2020-11-01 DIAGNOSIS — Z79899 Other long term (current) drug therapy: Secondary | ICD-10-CM | POA: Diagnosis not present

## 2020-11-01 DIAGNOSIS — E559 Vitamin D deficiency, unspecified: Secondary | ICD-10-CM | POA: Insufficient documentation

## 2020-11-01 DIAGNOSIS — I132 Hypertensive heart and chronic kidney disease with heart failure and with stage 5 chronic kidney disease, or end stage renal disease: Secondary | ICD-10-CM | POA: Diagnosis not present

## 2020-11-01 DIAGNOSIS — E039 Hypothyroidism, unspecified: Secondary | ICD-10-CM | POA: Diagnosis not present

## 2020-11-01 DIAGNOSIS — K219 Gastro-esophageal reflux disease without esophagitis: Secondary | ICD-10-CM | POA: Insufficient documentation

## 2020-11-01 DIAGNOSIS — C44309 Unspecified malignant neoplasm of skin of other parts of face: Secondary | ICD-10-CM

## 2020-11-01 DIAGNOSIS — E1122 Type 2 diabetes mellitus with diabetic chronic kidney disease: Secondary | ICD-10-CM | POA: Insufficient documentation

## 2020-11-01 DIAGNOSIS — C3432 Malignant neoplasm of lower lobe, left bronchus or lung: Secondary | ICD-10-CM | POA: Diagnosis not present

## 2020-11-01 DIAGNOSIS — N186 End stage renal disease: Secondary | ICD-10-CM | POA: Diagnosis not present

## 2020-11-01 DIAGNOSIS — Z87891 Personal history of nicotine dependence: Secondary | ICD-10-CM | POA: Insufficient documentation

## 2020-11-01 DIAGNOSIS — Z85828 Personal history of other malignant neoplasm of skin: Secondary | ICD-10-CM | POA: Diagnosis not present

## 2020-11-01 DIAGNOSIS — Z794 Long term (current) use of insulin: Secondary | ICD-10-CM | POA: Diagnosis not present

## 2020-11-01 DIAGNOSIS — E785 Hyperlipidemia, unspecified: Secondary | ICD-10-CM | POA: Diagnosis not present

## 2020-11-01 DIAGNOSIS — I083 Combined rheumatic disorders of mitral, aortic and tricuspid valves: Secondary | ICD-10-CM | POA: Insufficient documentation

## 2020-11-01 DIAGNOSIS — I2510 Atherosclerotic heart disease of native coronary artery without angina pectoris: Secondary | ICD-10-CM | POA: Diagnosis not present

## 2020-11-01 NOTE — Progress Notes (Signed)
Radiation Oncology         ([phone]) [phone] ________________________________  Name: Victor Schultz        MRN: [id number]  Date of Service: 11/01/2020 DOB: 04-Jul-1951  EM:[id number], Zeb Comfort, MD  Derwood Kaplan, MD     REFERRING PHYSICIAN: Derwood Kaplan, MD   DIAGNOSIS: The primary encounter diagnosis was Malignant neoplasm of lower lobe of left lung ([place]). A diagnosis of Unspecified malignant neoplasm of skin of other parts of face was also pertinent to this visit.   HISTORY OF PRESENT ILLNESS: Victor Schultz is a 70 y.o. male seen at the request of Dr. Hinton Rao for a putative Stage I lung carcinoma. The patient had a fall a few months ago and during his work up to rule out trauma, a CT chest on 07/08/20 revealed mass in the LLL. He did have fractures of his ribs attributed to his fall.  A PET scan on 09/07/20 revealed a lobulated LLL pulmonary nodule that was hypermetabolic with an SUV 4.4. no additional hypermetabolic or suspicious pulmonary nodules were noted. He did have hypermetabolic changes of the left ear. He is seen today to discuss options of stereotactic body radiotherapy as he is not a surgical candidate for lobectomy.    PREVIOUS RADIATION THERAPY: No   PAST MEDICAL HISTORY:  Past Medical History:  Diagnosis Date  . Abnormal hemoglobin (Hgb) ([place]) 12/08/2019  . Actinic keratosis 04/15/2020  . Acute upper GI bleeding 12/22/2019  . Anemia 04/15/2020  . Atherosclerotic heart disease of native coronary artery with unstable angina pectoris ([place])   . CAD in native artery 02/08/2016  . Cancer ([place])    skin cancer on both arms.  . Chronic hepatitis ([place]) 04/05/2020  . Chronic kidney disease, stage 4 (severe) ([place]) 01/02/2020  . 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 Contact dermatitis and other eczema due to other chemical products 04/15/2020  . Disorder of mineral metabolism, unspecified 06/18/2020  . Dizziness 05/08/2020  . End stage renal disease ([place]) 04/15/2020  .  Epigastric abdominal tenderness without rebound tenderness 01/02/2020  . Essential hypertension 12/22/2019  . Gastroesophageal reflux disease without esophagitis 12/06/2019  . Hypertensive heart and chronic kidney disease without heart failure, with stage 5 chronic kidney disease, or end stage renal disease ([place])    ESR,kidney transplant  . Hypertensive heart and renal disease 01/02/2020  . Hypothyroidism   . Kidney transplant status, cadaveric 07/08/2017   Formatting of this note might be different from the original. In the 1970s and again in 1982  . Long term (current) use of insulin ([place])   . Metabolic acidosis 11/07/69  . Mixed hyperlipidemia   . Obstructive uropathy 06/18/2020  . Personal history of malignant neoplasm of skin 10/09/2010   SQUAMOUS CELL CARCINOMA- R AND L FOREARM, LEFT EAR SQUAMOUS CELL CARCINOMA- R AND L FOREARM, LEFT EAR  Formatting of this note might be different from the original. SQUAMOUS CELL CARCINOMA- R AND L FOREARM, LEFT EAR  . Presence of aortocoronary bypass graft 02/08/2016   Formatting of this note might be different from the original. in Oct of 2013 Formatting of this note might be different from the original. Formatting of this note might be different from the original. in Oct of 2013 Oct of 2013  Formatting of this note might be different from the original. Oct of 2013 Formatting of this note might be different from the original. in Oct of 2013 Formatting of this n  . Pure hypercholesterolemia 06/15/2020  . S/P CABG  x 3 02/08/2016   in Oct of 2013 Oct of 2013  Formatting of this note might be different from the original. Oct of 2013  . Serum potassium elevated 12/08/2019  . Short of breath on exertion 07/06/2020  . Squamous cell carcinoma of skin of ear and external auditory canal 11/16/2010   SCC on left helix treated with Mohs surgery SCC on left helix treated with Mohs surgery  Formatting of this note might be different from the original. Overview:  SCC on left helix  treated with Mohs surgery  . Type 2 diabetes mellitus with chronic kidney disease, without long-term current use of insulin ([place]) 12/06/2019  . Unspecified disorder of kidney and ureter 06/15/2020  . Vitamin D deficiency 02/08/2016   In Jan 2017, his vitamin D level was 11 In Jan 2017, his vitamin D level was 57  Formatting of this note might be different from the original. In Jan 2017, his vitamin D level was 11  . Weight gain 07/06/2020  . Wheezing 07/06/2020       PAST SURGICAL HISTORY: Past Surgical History:  Procedure Laterality Date  . BIOPSY  05/24/2020   Procedure: BIOPSY;  Surgeon: [name], MD;  Location: [facility];  Service: Endoscopy;;  . CATARACT EXTRACTION    . CORONARY ARTERY BYPASS GRAFT    . ESOPHAGOGASTRODUODENOSCOPY (EGD) WITH PROPOFOL Left 12/23/2019   Procedure: ESOPHAGOGASTRODUODENOSCOPY (EGD) WITH PROPOFOL;  Surgeon: [name], MD;  Location: [facility];  Service: Endoscopy;  Laterality: Left;  . ESOPHAGOGASTRODUODENOSCOPY (EGD) WITH PROPOFOL N/A 05/24/2020   Procedure: ESOPHAGOGASTRODUODENOSCOPY (EGD) WITH PROPOFOL;  Surgeon: [name], MD;  Location: [facility];  Service: Endoscopy;  Laterality: N/A;  . EXTERNAL EAR SURGERY Left 2021  . [place]  . TRANSURETHRAL INCISION OF PROSTATE N/A 05/09/2020   Procedure: TRANSURETHRAL INCISION OF THE PROSTATE (TUIP);  Surgeon: Irine Seal, MD;  Location: [facility];  Service: Urology;  Laterality: N/A;     FAMILY HISTORY:  Family History  Problem Relation Age of Onset  . Liver disease Mother   . Alzheimer's disease Father      SOCIAL HISTORY:  reports that he quit smoking about 3 months ago. His smoking use included cigarettes. He has a 19.50 pack-year smoking history. He has never used smokeless tobacco. He reports that he does not drink alcohol and does not use drugs. The patient is divorced and is accompanied by his step mother. He lives in [place].   ALLERGIES: Methoxy  polyethylene glycol-epoetin beta   MEDICATIONS:  Current Outpatient Medications  Medication Sig Dispense Refill  . albuterol (VENTOLIN HFA) 108 (90 Base) MCG/ACT inhaler Inhale 1-2 puffs into the lungs every 4 (four) hours as needed for wheezing or shortness of breath.    [name] amLODipine (NORVASC) 5 MG tablet Take 1 tablet (5 mg total) by mouth daily. 90 tablet 1  . atorvastatin (LIPITOR) 20 MG tablet Take 1 tablet (20 mg total) by mouth daily. 90 tablet 0  . azaTHIOprine (IMURAN) 50 MG tablet Take 1 tablet (50 mg total) by mouth daily. 270 tablet 0  . calcitRIOL (ROCALTROL) 0.25 MCG capsule Take 0.25 mcg by mouth daily.    . ferrous sulfate 325 (65 FE) MG tablet Take 325 mg by mouth every Sunday.    . levothyroxine (SYNTHROID) 50 MCG tablet Take 1 tablet (50 mcg total) by mouth daily. 90 tablet 0  . metoprolol tartrate (LOPRESSOR) 25 MG tablet Take 0.5 tablets (12.5 mg total) by mouth  2 (two) times daily. 90 tablet 0  . pantoprazole (PROTONIX) 40 MG tablet Take 1 tablet (40 mg total) by mouth 2 (two) times daily. 60 tablet 2  . sevelamer (RENAGEL) 800 MG tablet Take 1 tablet (800 mg total) by mouth in the morning and at bedtime. 180 tablet 0  . tamsulosin (FLOMAX) 0.4 MG CAPS capsule Take 1 capsule (0.4 mg total) by mouth daily. 90 capsule 1  . terazosin (HYTRIN) 1 MG capsule Take 1 capsule (1 mg total) by mouth daily. 90 capsule 0  . tiotropium (SPIRIVA HANDIHALER) 18 MCG inhalation capsule Place into inhaler and inhale.    . Vitamin D, Ergocalciferol, (DRISDOL) 1.25 MG (50000 UNIT) CAPS capsule Take 50,000 Units by mouth every Sunday.      No current facility-administered medications for this encounter.     REVIEW OF SYSTEMS: On review of systems, the patient reports that he is doing well overall. He does have chest wall pain from rib fractures from his fall. He does have shortness of breath at times when he is more physically active. He denies any chest pain, productive cough, fevers,  chills, night sweats, unintended weight changes. No other complaints are verbalized.    PHYSICAL EXAM:  Wt Readings from Last 3 Encounters:  11/01/20 151 lb 6 oz (68.7 kg)  10/16/20 153 lb 3.5 oz (69.5 kg)  09/22/20 155 lb 12.8 oz (70.7 kg)   Temp Readings from Last 3 Encounters:  11/01/20 (!) 97 F (36.1 C) (Temporal)  10/16/20 98 F (36.7 C)  09/22/20 98.1 F (36.7 C) (Oral)   BP Readings from Last 3 Encounters:  11/01/20 (!) 152/63  10/16/20 (!) 143/73  09/22/20 (!) 111/54   Pulse Readings from Last 3 Encounters:  11/01/20 (!) 115  10/16/20 90  09/22/20 (!) 106   Pain Assessment Pain Score: 3  Pain Loc: Shoulder (Right shoulder, due to fall a month ago.)/10  In general this is a well appearing caucasian male in no acute distress. He's alert and oriented x4 and appropriate throughout the examination. Cardiopulmonary assessment is negative for acute distress and he exhibits normal effort.    ECOG = 1  0 - Asymptomatic (Fully active, able to carry on all predisease activities without restriction)  1 - Symptomatic but completely ambulatory (Restricted in physically strenuous activity but ambulatory and able to carry out work of a light or sedentary nature. For example, light housework, office work)  2 - Symptomatic, <50% in bed during the day (Ambulatory and capable of all self care but unable to carry out any work activities. Up and about more than 50% of waking hours)  3 - Symptomatic, >50% in bed, but not bedbound (Capable of only limited self-care, confined to bed or chair 50% or more of waking hours)  4 - Bedbound (Completely disabled. Cannot carry on any self-care. Totally confined to bed or chair)  5 - Death   Eustace Pen MM, Creech RH, Tormey DC, et al. [phone]). "Toxicity and response criteria of the [facility]". [place] Oncol. 5 (6): 649-55    LABORATORY DATA:  Lab Results  Component Value Date   WBC 4.1 10/16/2020   HGB 8.6 (L)  10/16/2020   HCT 27.5 (L) 10/16/2020   MCV 100.4 (H) 10/16/2020   PLT 183 10/16/2020   Lab Results  Component Value Date   NA 137 10/16/2020   K 4.0 10/16/2020   CL 105 10/16/2020   CO2 20 (L) 10/16/2020   Lab Results  Component Value Date   ALT 16 10/16/2020   AST 28 10/16/2020   ALKPHOS 109 10/16/2020   BILITOT 0.4 10/16/2020      RADIOGRAPHY: CT Angio Chest PE W/Cm &/Or Wo Cm  Result Date: 10/13/2020 CLINICAL DATA:  PE suspected, high prob Shortness of breath and weakness. EXAM: CT ANGIOGRAPHY CHEST WITH CONTRAST TECHNIQUE: Multidetector CT imaging of the chest was performed using the standard protocol during bolus administration of intravenous contrast. Multiplanar CT image reconstructions and MIPs were obtained to evaluate the vascular anatomy. CONTRAST:  43m OMNIPAQUE IOHEXOL 350 MG/ML SOLN COMPARISON:  PET CT 09/07/2020. FINDINGS: Cardiovascular: There are no filling defects within the pulmonary arteries to suggest pulmonary embolus. Right-sided dialysis catheter remains in place. Atherosclerosis of the thoracic aorta with tortuosity. No aortic dissection or acute aortic findings. Multi chamber cardiomegaly. Post CABG with dense calcification of native coronary arteries. No pericardial effusion. Mediastinum/Nodes: No enlarged mediastinal or hilar lymph nodes. Small hiatal hernia. Small amount of low-density fluid adjacent to the distal esophagus likely residual tracking ascites, improved from prior exam. No visualized thyroid nodule. Lungs/Pleura: Improved right pleural effusion from prior exam. Trace left pleural effusion is unchanged. Anterior left lower lobe pulmonary nodule measures 2.1 x 1.4 cm, unchanged from recent PET. Subpleural reticulation and areas of honeycombing are unchanged from prior exam. No acute airspace disease. Scattered calcified granuloma. Upper Abdomen: Small volume abdominal ascites. No acute upper abdominal findings. Musculoskeletal: Median sternotomy. Faint  sclerotic density within T10 is unchanged from prior imaging, not hypermetabolic on prior PET. Mild chronic loss of height of upper thoracic vertebra. No acute osseous abnormalities are seen. Review of the MIP images confirms the above findings. IMPRESSION: 1. No pulmonary embolus or acute intrathoracic abnormality. 2. Improved right pleural effusion from prior exam. Trace left pleural effusion is unchanged. 3. Unchanged left lower lobe pulmonary nodule. 4. Grossly stable interstitial lung disease allowing for motion on the current exam. Aortic Atherosclerosis (ICD10-I70.0). Electronically Signed   By: MKeith RakeM.D.   On: 10/13/2020 21:43   DG Chest Port 1 View  Result Date: 10/13/2020 CLINICAL DATA:  Shortness of breath and weakness. EXAM: PORTABLE CHEST 1 VIEW COMPARISON:  07/07/2020 FINDINGS: Heart size is stable but enlarged. The patient is status post prior median sternotomy. There is a well-positioned tunneled dialysis catheter on the right. There is stable bibasilar atelectasis and scarring. There is no pneumothorax. No large pleural effusion. No acute osseous abnormality. IMPRESSION: No acute abnormality. Electronically Signed   By: CConstance HolsterM.D.   On: 10/13/2020 19:19   ECHOCARDIOGRAM LIMITED  Result Date: 10/14/2020    ECHOCARDIOGRAM REPORT   Patient Name:   DCYRUSS ARATADate of Exam: 10/14/2020 Medical Rec #:  [id number]   Height:       64.0 in Accession #:    [id number]  Weight:       156.5 lb Date of Birth:  [birth year]    BSA:          1.763 m Patient Age:    89years     BP:           133/67 mmHg Patient Gender: M            HR:           92 bpm. Exam Location:  Inpatient Procedure: Limited Echo, Cardiac Doppler and Color Doppler Indications:    Elvated troponin  History:        Patient has no prior history  of Echocardiogram examinations.                 CAD, Prior CABG; Risk Factors:Diabetes, Hypertension,                 Dyslipidemia and Former Smoker. COVID-19. ESRD.   Sonographer:    Clayton Lefort RDCS (AE) Referring Phys: [id number] [facility]  Sonographer Comments: Suboptimal parasternal window. IMPRESSIONS  1. Left ventricular ejection fraction, by estimation, is 55 to 60%. The left ventricle has normal function. The left ventricle has no regional wall motion abnormalities. There is moderate left ventricular hypertrophy. Left ventricular diastolic parameters are indeterminate.  2. Right ventricular systolic function is normal. The right ventricular size is normal. There is normal pulmonary artery systolic pressure.  3. The mitral valve is grossly normal. Mild to moderate mitral valve regurgitation.  4. The aortic valve is normal in structure. Aortic valve regurgitation is trivial. FINDINGS  Left Ventricle: Left ventricular ejection fraction, by estimation, is 55 to 60%. The left ventricle has normal function. The left ventricle has no regional wall motion abnormalities. The left ventricular internal cavity size was normal in size. There is  moderate left ventricular hypertrophy. Left ventricular diastolic parameters are indeterminate. Right Ventricle: The right ventricular size is normal. No increase in right ventricular wall thickness. Right ventricular systolic function is normal. There is normal pulmonary artery systolic pressure. The tricuspid regurgitant velocity is 2.40 m/s, and  with an assumed right atrial pressure of 8 mmHg, the estimated right ventricular systolic pressure is 16.0 mmHg. Left Atrium: Left atrial size was normal in size. Right Atrium: Right atrial size was normal in size. Pericardium: There is no evidence of pericardial effusion. Mitral Valve: The mitral valve is grossly normal. Mild to moderate mitral valve regurgitation. Tricuspid Valve: The tricuspid valve is grossly normal. Tricuspid valve regurgitation is trivial. Aortic Valve: The aortic valve is normal in structure. Aortic valve regurgitation is trivial. Pulmonic Valve: The pulmonic valve was  normal in structure. Pulmonic valve regurgitation is not visualized. Aorta: The aortic root and ascending aorta are structurally normal, with no evidence of dilitation. IAS/Shunts: The atrial septum is grossly normal.  LEFT VENTRICLE PLAX 2D LVIDd:         4.80 cm LVIDs:         3.30 cm LV PW:         1.80 cm LV IVS:        1.50 cm LVOT diam:     2.40 cm LVOT Area:     4.52 cm  IVC IVC diam: 1.40 cm LEFT ATRIUM         Index LA diam:    3.80 cm 2.16 cm/m   AORTA Ao Root diam: 3.20 cm TRICUSPID VALVE TR Peak grad:   23.0 mmHg TR Vmax:        240.00 cm/s  SHUNTS Systemic Diam: 2.40 cm Mertie Moores MD Electronically signed by Mertie Moores MD Signature Date/Time: 10/14/2020/1:53:15 PM    Final        IMPRESSION/PLAN: 1. Putative Stage IA3, cT1cN0M0, NSCLC of the LLL. Dr. Lisbeth Renshaw discusses the pathology findings and reviews the nature of early stage lung cancer. While it would be most ideal if he could have tissue sampling to confirm our suspicion of non small cell lung cancer of the LLL, his medical comorbidities make the risks of doing a procedure higher than the benefit. Similarly he is not a candidate for surgical resection per pulmonary and medical oncology, and rather, Dr.  Lisbeth Renshaw would like to offer definitive stereotactic body radiotherapy (SBRT).  We discussed the risks, benefits, short, and long term effects of radiotherapy, as well as the curative intent, and the patient is interested in proceeding. Dr. Lisbeth Renshaw discusses the delivery and logistics of radiotherapy and anticipates a course of 3-5 fractions of radiotherapy. He will return on Wednesday next week for simulation. Written consent is obtained and placed in the chart, a copy was provided to the patient, and plans to follow up with Dr. Hinton Rao for long term surveillance. 2. History of renal transplant x2 with ESRD on HD. The patient will continue his dialysis sessions throughout the radiotherapy treatment and follow up with [facility]  as scheduled. 3. 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 The patient is going to follow up with his dermatology team for management of his right hand. We will follow this expectantly but he is aware of the utility of radiotherapy if he had perineural involvement or positive margins with excision.  In a visit lasting 60 minutes, greater than 50% of the time was spent face to face discussing the patient's condition, in preparation for the discussion, and coordinating the patient's care.   The above documentation reflects my direct findings during this shared patient visit. Please see the separate note by Dr. Lisbeth Renshaw on this date for the remainder of the patient's plan of care.    Carola Rhine, PAC

## 2020-11-02 DIAGNOSIS — U071 COVID-19: Secondary | ICD-10-CM | POA: Diagnosis not present

## 2020-11-02 DIAGNOSIS — N186 End stage renal disease: Secondary | ICD-10-CM | POA: Diagnosis not present

## 2020-11-02 DIAGNOSIS — E1122 Type 2 diabetes mellitus with diabetic chronic kidney disease: Secondary | ICD-10-CM | POA: Diagnosis not present

## 2020-11-02 DIAGNOSIS — N400 Enlarged prostate without lower urinary tract symptoms: Secondary | ICD-10-CM | POA: Diagnosis not present

## 2020-11-02 DIAGNOSIS — Z992 Dependence on renal dialysis: Secondary | ICD-10-CM | POA: Diagnosis not present

## 2020-11-02 DIAGNOSIS — E876 Hypokalemia: Secondary | ICD-10-CM | POA: Diagnosis not present

## 2020-11-02 DIAGNOSIS — D631 Anemia in chronic kidney disease: Secondary | ICD-10-CM | POA: Diagnosis not present

## 2020-11-02 DIAGNOSIS — N2581 Secondary hyperparathyroidism of renal origin: Secondary | ICD-10-CM | POA: Diagnosis not present

## 2020-11-02 DIAGNOSIS — Z8616 Personal history of COVID-19: Secondary | ICD-10-CM | POA: Insufficient documentation

## 2020-11-02 DIAGNOSIS — D509 Iron deficiency anemia, unspecified: Secondary | ICD-10-CM | POA: Diagnosis not present

## 2020-11-02 DIAGNOSIS — J441 Chronic obstructive pulmonary disease with (acute) exacerbation: Secondary | ICD-10-CM | POA: Diagnosis not present

## 2020-11-02 DIAGNOSIS — I120 Hypertensive chronic kidney disease with stage 5 chronic kidney disease or end stage renal disease: Secondary | ICD-10-CM | POA: Diagnosis not present

## 2020-11-04 DIAGNOSIS — N2581 Secondary hyperparathyroidism of renal origin: Secondary | ICD-10-CM | POA: Diagnosis not present

## 2020-11-04 DIAGNOSIS — E876 Hypokalemia: Secondary | ICD-10-CM | POA: Diagnosis not present

## 2020-11-04 DIAGNOSIS — D509 Iron deficiency anemia, unspecified: Secondary | ICD-10-CM | POA: Diagnosis not present

## 2020-11-04 DIAGNOSIS — N186 End stage renal disease: Secondary | ICD-10-CM | POA: Diagnosis not present

## 2020-11-04 DIAGNOSIS — Z992 Dependence on renal dialysis: Secondary | ICD-10-CM | POA: Diagnosis not present

## 2020-11-04 DIAGNOSIS — U071 COVID-19: Secondary | ICD-10-CM | POA: Diagnosis not present

## 2020-11-07 DIAGNOSIS — D631 Anemia in chronic kidney disease: Secondary | ICD-10-CM | POA: Diagnosis not present

## 2020-11-07 DIAGNOSIS — D509 Iron deficiency anemia, unspecified: Secondary | ICD-10-CM | POA: Diagnosis not present

## 2020-11-07 DIAGNOSIS — C3432 Malignant neoplasm of lower lobe, left bronchus or lung: Secondary | ICD-10-CM | POA: Diagnosis not present

## 2020-11-07 DIAGNOSIS — Z992 Dependence on renal dialysis: Secondary | ICD-10-CM | POA: Diagnosis not present

## 2020-11-07 DIAGNOSIS — E876 Hypokalemia: Secondary | ICD-10-CM | POA: Diagnosis not present

## 2020-11-07 DIAGNOSIS — Z510 Encounter for antineoplastic radiation therapy: Secondary | ICD-10-CM | POA: Diagnosis not present

## 2020-11-07 DIAGNOSIS — N2581 Secondary hyperparathyroidism of renal origin: Secondary | ICD-10-CM | POA: Diagnosis not present

## 2020-11-07 DIAGNOSIS — Z87891 Personal history of nicotine dependence: Secondary | ICD-10-CM | POA: Diagnosis not present

## 2020-11-07 DIAGNOSIS — T8610 Unspecified complication of kidney transplant: Secondary | ICD-10-CM | POA: Diagnosis not present

## 2020-11-07 DIAGNOSIS — N186 End stage renal disease: Secondary | ICD-10-CM | POA: Diagnosis not present

## 2020-11-08 ENCOUNTER — Ambulatory Visit
Admission: RE | Admit: 2020-11-08 | Discharge: 2020-11-08 | Disposition: A | Discharge status: Home / Self Care | Payer: Medicare Other | Source: Ambulatory Visit | Attending: Radiation Oncology | Admitting: Radiation Oncology

## 2020-11-08 ENCOUNTER — Other Ambulatory Visit: Payer: Self-pay

## 2020-11-08 DIAGNOSIS — Z510 Encounter for antineoplastic radiation therapy: Secondary | ICD-10-CM | POA: Diagnosis not present

## 2020-11-08 DIAGNOSIS — C3432 Malignant neoplasm of lower lobe, left bronchus or lung: Secondary | ICD-10-CM | POA: Insufficient documentation

## 2020-11-08 DIAGNOSIS — Z87891 Personal history of nicotine dependence: Secondary | ICD-10-CM | POA: Diagnosis not present

## 2020-11-09 DIAGNOSIS — E876 Hypokalemia: Secondary | ICD-10-CM | POA: Diagnosis not present

## 2020-11-09 DIAGNOSIS — N186 End stage renal disease: Secondary | ICD-10-CM | POA: Diagnosis not present

## 2020-11-09 DIAGNOSIS — D509 Iron deficiency anemia, unspecified: Secondary | ICD-10-CM | POA: Diagnosis not present

## 2020-11-09 DIAGNOSIS — Z992 Dependence on renal dialysis: Secondary | ICD-10-CM | POA: Diagnosis not present

## 2020-11-09 DIAGNOSIS — D631 Anemia in chronic kidney disease: Secondary | ICD-10-CM | POA: Diagnosis not present

## 2020-11-09 DIAGNOSIS — N2581 Secondary hyperparathyroidism of renal origin: Secondary | ICD-10-CM | POA: Diagnosis not present

## 2020-11-11 DIAGNOSIS — Z992 Dependence on renal dialysis: Secondary | ICD-10-CM | POA: Diagnosis not present

## 2020-11-11 DIAGNOSIS — E876 Hypokalemia: Secondary | ICD-10-CM | POA: Diagnosis not present

## 2020-11-11 DIAGNOSIS — D509 Iron deficiency anemia, unspecified: Secondary | ICD-10-CM | POA: Diagnosis not present

## 2020-11-11 DIAGNOSIS — D631 Anemia in chronic kidney disease: Secondary | ICD-10-CM | POA: Diagnosis not present

## 2020-11-11 DIAGNOSIS — N186 End stage renal disease: Secondary | ICD-10-CM | POA: Diagnosis not present

## 2020-11-11 DIAGNOSIS — N2581 Secondary hyperparathyroidism of renal origin: Secondary | ICD-10-CM | POA: Diagnosis not present

## 2020-11-14 DIAGNOSIS — E876 Hypokalemia: Secondary | ICD-10-CM | POA: Diagnosis not present

## 2020-11-14 DIAGNOSIS — Z510 Encounter for antineoplastic radiation therapy: Secondary | ICD-10-CM | POA: Diagnosis not present

## 2020-11-14 DIAGNOSIS — N2581 Secondary hyperparathyroidism of renal origin: Secondary | ICD-10-CM | POA: Diagnosis not present

## 2020-11-14 DIAGNOSIS — D509 Iron deficiency anemia, unspecified: Secondary | ICD-10-CM | POA: Diagnosis not present

## 2020-11-14 DIAGNOSIS — Z87891 Personal history of nicotine dependence: Secondary | ICD-10-CM | POA: Diagnosis not present

## 2020-11-14 DIAGNOSIS — C3432 Malignant neoplasm of lower lobe, left bronchus or lung: Secondary | ICD-10-CM | POA: Diagnosis not present

## 2020-11-14 DIAGNOSIS — D631 Anemia in chronic kidney disease: Secondary | ICD-10-CM | POA: Diagnosis not present

## 2020-11-14 DIAGNOSIS — N186 End stage renal disease: Secondary | ICD-10-CM | POA: Diagnosis not present

## 2020-11-14 DIAGNOSIS — Z992 Dependence on renal dialysis: Secondary | ICD-10-CM | POA: Diagnosis not present

## 2020-11-15 ENCOUNTER — Ambulatory Visit
Admission: RE | Admit: 2020-11-15 | Discharge: 2020-11-15 | Disposition: A | Discharge status: Home / Self Care | Payer: Medicare Other | Source: Ambulatory Visit | Attending: Radiation Oncology | Admitting: Radiation Oncology

## 2020-11-15 DIAGNOSIS — C3432 Malignant neoplasm of lower lobe, left bronchus or lung: Secondary | ICD-10-CM | POA: Diagnosis not present

## 2020-11-15 DIAGNOSIS — Z87891 Personal history of nicotine dependence: Secondary | ICD-10-CM | POA: Diagnosis not present

## 2020-11-15 DIAGNOSIS — Z510 Encounter for antineoplastic radiation therapy: Secondary | ICD-10-CM | POA: Diagnosis not present

## 2020-11-16 ENCOUNTER — Ambulatory Visit: Payer: Medicare Other | Admitting: Radiation Oncology

## 2020-11-16 ENCOUNTER — Other Ambulatory Visit: Payer: Self-pay

## 2020-11-16 ENCOUNTER — Ambulatory Visit
Admission: RE | Admit: 2020-11-16 | Discharge: 2020-11-16 | Disposition: A | Discharge status: Home / Self Care | Payer: Medicare Other | Source: Ambulatory Visit | Attending: Radiation Oncology | Admitting: Radiation Oncology

## 2020-11-16 DIAGNOSIS — C3432 Malignant neoplasm of lower lobe, left bronchus or lung: Secondary | ICD-10-CM | POA: Diagnosis not present

## 2020-11-16 DIAGNOSIS — N186 End stage renal disease: Secondary | ICD-10-CM | POA: Diagnosis not present

## 2020-11-16 DIAGNOSIS — E876 Hypokalemia: Secondary | ICD-10-CM | POA: Diagnosis not present

## 2020-11-16 DIAGNOSIS — N2581 Secondary hyperparathyroidism of renal origin: Secondary | ICD-10-CM | POA: Diagnosis not present

## 2020-11-16 DIAGNOSIS — Z87891 Personal history of nicotine dependence: Secondary | ICD-10-CM | POA: Diagnosis not present

## 2020-11-16 DIAGNOSIS — D509 Iron deficiency anemia, unspecified: Secondary | ICD-10-CM | POA: Diagnosis not present

## 2020-11-16 DIAGNOSIS — Z992 Dependence on renal dialysis: Secondary | ICD-10-CM | POA: Diagnosis not present

## 2020-11-16 DIAGNOSIS — D631 Anemia in chronic kidney disease: Secondary | ICD-10-CM | POA: Diagnosis not present

## 2020-11-16 DIAGNOSIS — Z510 Encounter for antineoplastic radiation therapy: Secondary | ICD-10-CM | POA: Diagnosis not present

## 2020-11-17 ENCOUNTER — Ambulatory Visit
Admission: RE | Admit: 2020-11-17 | Discharge: 2020-11-17 | Disposition: A | Discharge status: Home / Self Care | Payer: Medicare Other | Source: Ambulatory Visit | Attending: Radiation Oncology | Admitting: Radiation Oncology

## 2020-11-17 DIAGNOSIS — Z87891 Personal history of nicotine dependence: Secondary | ICD-10-CM | POA: Diagnosis not present

## 2020-11-17 DIAGNOSIS — C3432 Malignant neoplasm of lower lobe, left bronchus or lung: Secondary | ICD-10-CM | POA: Diagnosis not present

## 2020-11-17 DIAGNOSIS — Z510 Encounter for antineoplastic radiation therapy: Secondary | ICD-10-CM | POA: Diagnosis not present

## 2020-11-18 DIAGNOSIS — N2581 Secondary hyperparathyroidism of renal origin: Secondary | ICD-10-CM | POA: Diagnosis not present

## 2020-11-18 DIAGNOSIS — Z992 Dependence on renal dialysis: Secondary | ICD-10-CM | POA: Diagnosis not present

## 2020-11-18 DIAGNOSIS — E876 Hypokalemia: Secondary | ICD-10-CM | POA: Diagnosis not present

## 2020-11-18 DIAGNOSIS — N186 End stage renal disease: Secondary | ICD-10-CM | POA: Diagnosis not present

## 2020-11-18 DIAGNOSIS — D509 Iron deficiency anemia, unspecified: Secondary | ICD-10-CM | POA: Diagnosis not present

## 2020-11-18 DIAGNOSIS — D631 Anemia in chronic kidney disease: Secondary | ICD-10-CM | POA: Diagnosis not present

## 2020-11-20 ENCOUNTER — Ambulatory Visit
Admission: RE | Admit: 2020-11-20 | Discharge: 2020-11-20 | Disposition: A | Discharge status: Home / Self Care | Payer: Medicare Other | Source: Ambulatory Visit | Attending: Radiation Oncology | Admitting: Radiation Oncology

## 2020-11-20 ENCOUNTER — Other Ambulatory Visit: Payer: Self-pay

## 2020-11-20 DIAGNOSIS — Z87891 Personal history of nicotine dependence: Secondary | ICD-10-CM | POA: Diagnosis not present

## 2020-11-20 DIAGNOSIS — C3432 Malignant neoplasm of lower lobe, left bronchus or lung: Secondary | ICD-10-CM | POA: Diagnosis not present

## 2020-11-20 DIAGNOSIS — Z510 Encounter for antineoplastic radiation therapy: Secondary | ICD-10-CM | POA: Diagnosis not present

## 2020-11-21 ENCOUNTER — Ambulatory Visit
Admission: RE | Admit: 2020-11-21 | Discharge: 2020-11-21 | Disposition: A | Discharge status: Home / Self Care | Payer: Medicare Other | Source: Ambulatory Visit | Attending: Radiation Oncology | Admitting: Radiation Oncology

## 2020-11-21 DIAGNOSIS — Z87891 Personal history of nicotine dependence: Secondary | ICD-10-CM | POA: Diagnosis not present

## 2020-11-21 DIAGNOSIS — N186 End stage renal disease: Secondary | ICD-10-CM | POA: Diagnosis not present

## 2020-11-21 DIAGNOSIS — N2581 Secondary hyperparathyroidism of renal origin: Secondary | ICD-10-CM | POA: Diagnosis not present

## 2020-11-21 DIAGNOSIS — Z992 Dependence on renal dialysis: Secondary | ICD-10-CM | POA: Diagnosis not present

## 2020-11-21 DIAGNOSIS — D509 Iron deficiency anemia, unspecified: Secondary | ICD-10-CM | POA: Diagnosis not present

## 2020-11-21 DIAGNOSIS — E876 Hypokalemia: Secondary | ICD-10-CM | POA: Diagnosis not present

## 2020-11-21 DIAGNOSIS — D631 Anemia in chronic kidney disease: Secondary | ICD-10-CM | POA: Diagnosis not present

## 2020-11-21 DIAGNOSIS — C3432 Malignant neoplasm of lower lobe, left bronchus or lung: Secondary | ICD-10-CM | POA: Diagnosis not present

## 2020-11-21 DIAGNOSIS — Z510 Encounter for antineoplastic radiation therapy: Secondary | ICD-10-CM | POA: Diagnosis not present

## 2020-11-22 ENCOUNTER — Ambulatory Visit: Payer: Medicare Other | Admitting: Radiation Oncology

## 2020-11-22 ENCOUNTER — Ambulatory Visit
Admission: RE | Admit: 2020-11-22 | Discharge: 2020-11-22 | Disposition: A | Discharge status: Home / Self Care | Payer: Medicare Other | Source: Ambulatory Visit | Attending: Radiation Oncology | Admitting: Radiation Oncology

## 2020-11-22 DIAGNOSIS — Z87891 Personal history of nicotine dependence: Secondary | ICD-10-CM | POA: Diagnosis not present

## 2020-11-22 DIAGNOSIS — C3432 Malignant neoplasm of lower lobe, left bronchus or lung: Secondary | ICD-10-CM | POA: Diagnosis not present

## 2020-11-22 DIAGNOSIS — Z510 Encounter for antineoplastic radiation therapy: Secondary | ICD-10-CM | POA: Diagnosis not present

## 2020-11-23 ENCOUNTER — Ambulatory Visit
Admission: RE | Admit: 2020-11-23 | Discharge: 2020-11-23 | Disposition: A | Discharge status: Home / Self Care | Payer: Medicare Other | Source: Ambulatory Visit | Attending: Radiation Oncology | Admitting: Radiation Oncology

## 2020-11-23 DIAGNOSIS — Z87891 Personal history of nicotine dependence: Secondary | ICD-10-CM | POA: Diagnosis not present

## 2020-11-23 DIAGNOSIS — N2581 Secondary hyperparathyroidism of renal origin: Secondary | ICD-10-CM | POA: Diagnosis not present

## 2020-11-23 DIAGNOSIS — D631 Anemia in chronic kidney disease: Secondary | ICD-10-CM | POA: Diagnosis not present

## 2020-11-23 DIAGNOSIS — C3432 Malignant neoplasm of lower lobe, left bronchus or lung: Secondary | ICD-10-CM | POA: Diagnosis not present

## 2020-11-23 DIAGNOSIS — E876 Hypokalemia: Secondary | ICD-10-CM | POA: Diagnosis not present

## 2020-11-23 DIAGNOSIS — D509 Iron deficiency anemia, unspecified: Secondary | ICD-10-CM | POA: Diagnosis not present

## 2020-11-23 DIAGNOSIS — Z510 Encounter for antineoplastic radiation therapy: Secondary | ICD-10-CM | POA: Diagnosis not present

## 2020-11-23 DIAGNOSIS — N186 End stage renal disease: Secondary | ICD-10-CM | POA: Diagnosis not present

## 2020-11-23 DIAGNOSIS — Z992 Dependence on renal dialysis: Secondary | ICD-10-CM | POA: Diagnosis not present

## 2020-11-24 ENCOUNTER — Other Ambulatory Visit: Payer: Self-pay

## 2020-11-24 ENCOUNTER — Ambulatory Visit
Admission: RE | Admit: 2020-11-24 | Discharge: 2020-11-24 | Disposition: A | Discharge status: Home / Self Care | Payer: Medicare Other | Source: Ambulatory Visit | Attending: Radiation Oncology | Admitting: Radiation Oncology

## 2020-11-24 DIAGNOSIS — C3432 Malignant neoplasm of lower lobe, left bronchus or lung: Secondary | ICD-10-CM | POA: Diagnosis not present

## 2020-11-24 DIAGNOSIS — Z510 Encounter for antineoplastic radiation therapy: Secondary | ICD-10-CM | POA: Diagnosis not present

## 2020-11-24 DIAGNOSIS — Z87891 Personal history of nicotine dependence: Secondary | ICD-10-CM | POA: Diagnosis not present

## 2020-11-25 DIAGNOSIS — N186 End stage renal disease: Secondary | ICD-10-CM | POA: Diagnosis not present

## 2020-11-25 DIAGNOSIS — D631 Anemia in chronic kidney disease: Secondary | ICD-10-CM | POA: Diagnosis not present

## 2020-11-25 DIAGNOSIS — D509 Iron deficiency anemia, unspecified: Secondary | ICD-10-CM | POA: Diagnosis not present

## 2020-11-25 DIAGNOSIS — Z992 Dependence on renal dialysis: Secondary | ICD-10-CM | POA: Diagnosis not present

## 2020-11-25 DIAGNOSIS — N2581 Secondary hyperparathyroidism of renal origin: Secondary | ICD-10-CM | POA: Diagnosis not present

## 2020-11-25 DIAGNOSIS — E876 Hypokalemia: Secondary | ICD-10-CM | POA: Diagnosis not present

## 2020-11-27 ENCOUNTER — Ambulatory Visit
Admission: RE | Admit: 2020-11-27 | Discharge: 2020-11-27 | Disposition: A | Discharge status: Home / Self Care | Payer: Medicare Other | Source: Ambulatory Visit | Attending: Radiation Oncology | Admitting: Radiation Oncology

## 2020-11-27 ENCOUNTER — Other Ambulatory Visit: Payer: Self-pay

## 2020-11-27 DIAGNOSIS — Z87891 Personal history of nicotine dependence: Secondary | ICD-10-CM | POA: Diagnosis not present

## 2020-11-27 DIAGNOSIS — Z510 Encounter for antineoplastic radiation therapy: Secondary | ICD-10-CM | POA: Diagnosis not present

## 2020-11-27 DIAGNOSIS — C3432 Malignant neoplasm of lower lobe, left bronchus or lung: Secondary | ICD-10-CM | POA: Diagnosis not present

## 2020-11-28 ENCOUNTER — Other Ambulatory Visit: Payer: Self-pay

## 2020-11-28 ENCOUNTER — Ambulatory Visit
Admission: RE | Admit: 2020-11-28 | Discharge: 2020-11-28 | Disposition: A | Discharge status: Home / Self Care | Payer: Medicare Other | Source: Ambulatory Visit | Attending: Radiation Oncology | Admitting: Radiation Oncology

## 2020-11-28 ENCOUNTER — Encounter: Payer: Self-pay | Admitting: Radiation Oncology

## 2020-11-28 DIAGNOSIS — Z992 Dependence on renal dialysis: Secondary | ICD-10-CM | POA: Diagnosis not present

## 2020-11-28 DIAGNOSIS — N2581 Secondary hyperparathyroidism of renal origin: Secondary | ICD-10-CM | POA: Diagnosis not present

## 2020-11-28 DIAGNOSIS — Z510 Encounter for antineoplastic radiation therapy: Secondary | ICD-10-CM | POA: Diagnosis not present

## 2020-11-28 DIAGNOSIS — C3432 Malignant neoplasm of lower lobe, left bronchus or lung: Secondary | ICD-10-CM | POA: Diagnosis not present

## 2020-11-28 DIAGNOSIS — D631 Anemia in chronic kidney disease: Secondary | ICD-10-CM | POA: Diagnosis not present

## 2020-11-28 DIAGNOSIS — E876 Hypokalemia: Secondary | ICD-10-CM | POA: Diagnosis not present

## 2020-11-28 DIAGNOSIS — N186 End stage renal disease: Secondary | ICD-10-CM | POA: Diagnosis not present

## 2020-11-28 DIAGNOSIS — D509 Iron deficiency anemia, unspecified: Secondary | ICD-10-CM | POA: Diagnosis not present

## 2020-11-28 DIAGNOSIS — Z87891 Personal history of nicotine dependence: Secondary | ICD-10-CM | POA: Diagnosis not present

## 2020-11-30 DIAGNOSIS — Z992 Dependence on renal dialysis: Secondary | ICD-10-CM | POA: Diagnosis not present

## 2020-11-30 DIAGNOSIS — D509 Iron deficiency anemia, unspecified: Secondary | ICD-10-CM | POA: Diagnosis not present

## 2020-11-30 DIAGNOSIS — N2581 Secondary hyperparathyroidism of renal origin: Secondary | ICD-10-CM | POA: Diagnosis not present

## 2020-11-30 DIAGNOSIS — D631 Anemia in chronic kidney disease: Secondary | ICD-10-CM | POA: Diagnosis not present

## 2020-11-30 DIAGNOSIS — E876 Hypokalemia: Secondary | ICD-10-CM | POA: Diagnosis not present

## 2020-11-30 DIAGNOSIS — N186 End stage renal disease: Secondary | ICD-10-CM | POA: Diagnosis not present

## 2020-12-01 ENCOUNTER — Encounter: Payer: Self-pay | Admitting: Vascular Surgery

## 2020-12-01 ENCOUNTER — Encounter: Payer: Self-pay | Admitting: *Deleted

## 2020-12-01 ENCOUNTER — Other Ambulatory Visit: Payer: Self-pay | Admitting: *Deleted

## 2020-12-01 ENCOUNTER — Ambulatory Visit (INDEPENDENT_AMBULATORY_CARE_PROVIDER_SITE_OTHER): Payer: Medicare Other | Admitting: Vascular Surgery

## 2020-12-01 ENCOUNTER — Other Ambulatory Visit: Payer: Self-pay

## 2020-12-01 ENCOUNTER — Ambulatory Visit (INDEPENDENT_AMBULATORY_CARE_PROVIDER_SITE_OTHER)
Admission: RE | Admit: 2020-12-01 | Discharge: 2020-12-01 | Disposition: A | Discharge status: Home / Self Care | Payer: Medicare Other | Source: Ambulatory Visit | Attending: Vascular Surgery | Admitting: Vascular Surgery

## 2020-12-01 ENCOUNTER — Ambulatory Visit (HOSPITAL_COMMUNITY)
Admission: RE | Admit: 2020-12-01 | Discharge: 2020-12-01 | Disposition: A | Discharge status: Home / Self Care | Payer: Medicare Other | Source: Ambulatory Visit | Attending: Vascular Surgery | Admitting: Vascular Surgery

## 2020-12-01 VITALS — BP 147/81 | HR 103 | Temp 98.0°F | Resp 20 | Ht 64.0 in | Wt 144.0 lb

## 2020-12-01 DIAGNOSIS — N186 End stage renal disease: Secondary | ICD-10-CM

## 2020-12-01 DIAGNOSIS — N184 Chronic kidney disease, stage 4 (severe): Secondary | ICD-10-CM

## 2020-12-01 NOTE — Progress Notes (Signed)
Patient ID: Victor Schultz, male   DOB: April 11, 1951, 70 y.o.   MRN: [id number]  Reason for Consult: New Patient (Initial Visit)   Referred by Lillard Anes,*  Subjective:     HPI:  Victor Schultz is a 70 y.o. male previously on dialysis he has had bilateral upper extremity fistulas in the left forearm AV graft.  He has had 2 previous transplants that have failed.  He now is on dialysis via a tunneled catheter.  He is dialyzing Tuesdays Thursdays and Saturdays.  He does not take blood thinners.  He has no issues with his hands other than a recent skin cancer removed from his right hand.  He is ambidextrous  Past Medical History:  Diagnosis Date  . Abnormal hemoglobin (Hgb) ([place]) 12/08/2019  . Actinic keratosis 04/15/2020  . Acute upper GI bleeding 12/22/2019  . Anemia 04/15/2020  . Atherosclerotic heart disease of native coronary artery with unstable angina pectoris ([place])   . CAD in native artery 02/08/2016  . Cancer ([place])    skin cancer on both arms.  . Chronic hepatitis ([place]) 04/05/2020  . Chronic kidney disease, stage 4 (severe) ([place]) 01/02/2020  . 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 Contact dermatitis and other eczema due to other chemical products 04/15/2020  . Disorder of mineral metabolism, unspecified 06/18/2020  . Dizziness 05/08/2020  . End stage renal disease ([place]) 04/15/2020  . Epigastric abdominal tenderness without rebound tenderness 01/02/2020  . Essential hypertension 12/22/2019  . Gastroesophageal reflux disease without esophagitis 12/06/2019  . Hypertensive heart and chronic kidney disease without heart failure, with stage 5 chronic kidney disease, or end stage renal disease ([place])    ESR,kidney transplant  . Hypertensive heart and renal disease 01/02/2020  . Hypothyroidism   . Kidney transplant status, cadaveric 07/08/2017   Formatting of this note might be different from the original. In the 1970s and again in 1982  . Long term (current) use of insulin ([place])   .  Metabolic acidosis 9/32/3557  . Mixed hyperlipidemia   . Obstructive uropathy 06/18/2020  . Personal history of malignant neoplasm of skin 10/09/2010   SQUAMOUS CELL CARCINOMA- R AND L FOREARM, LEFT EAR SQUAMOUS CELL CARCINOMA- R AND L FOREARM, LEFT EAR  Formatting of this note might be different from the original. SQUAMOUS CELL CARCINOMA- R AND L FOREARM, LEFT EAR  . Presence of aortocoronary bypass graft 02/08/2016   Formatting of this note might be different from the original. in Oct of 2013 Formatting of this note might be different from the original. Formatting of this note might be different from the original. in Oct of 2013 Oct of 2013  Formatting of this note might be different from the original. Oct of 2013 Formatting of this note might be different from the original. in Oct of 2013 Formatting of this n  . Pure hypercholesterolemia 06/15/2020  . S/P CABG x 3 02/08/2016   in Oct of 2013 Oct of 2013  Formatting of this note might be different from the original. Oct of 2013  . Serum potassium elevated 12/08/2019  . Short of breath on exertion 07/06/2020  . Squamous cell carcinoma of skin of ear and external auditory canal 11/16/2010   SCC on left helix treated with Mohs surgery SCC on left helix treated with Mohs surgery  Formatting of this note might be different from the original. Overview:  SCC on left helix treated with Mohs surgery  . Type 2 diabetes mellitus with chronic kidney disease, without  long-term current use of insulin ([place]) 12/06/2019  . Unspecified disorder of kidney and ureter 06/15/2020  . Vitamin D deficiency 02/08/2016   In Jan 2017, his vitamin D level was 11 In Jan 2017, his vitamin D level was 85  Formatting of this note might be different from the original. In Jan 2017, his vitamin D level was 11  . Weight gain 07/06/2020  . Wheezing 07/06/2020   Family History  Problem Relation Age of Onset  . Liver disease Mother   . Alzheimer's disease Father    Past Surgical History:   Procedure Laterality Date  . BIOPSY  05/24/2020   Procedure: BIOPSY;  Surgeon: [name], MD;  Location: [facility];  Service: Endoscopy;;  . CATARACT EXTRACTION    . CORONARY ARTERY BYPASS GRAFT    . ESOPHAGOGASTRODUODENOSCOPY (EGD) WITH PROPOFOL Left 12/23/2019   Procedure: ESOPHAGOGASTRODUODENOSCOPY (EGD) WITH PROPOFOL;  Surgeon: [name], MD;  Location: [facility];  Service: Endoscopy;  Laterality: Left;  . ESOPHAGOGASTRODUODENOSCOPY (EGD) WITH PROPOFOL N/A 05/24/2020   Procedure: ESOPHAGOGASTRODUODENOSCOPY (EGD) WITH PROPOFOL;  Surgeon: [name], MD;  Location: [facility];  Service: Endoscopy;  Laterality: N/A;  . EXTERNAL EAR SURGERY Left 2021  . [place]  . TRANSURETHRAL INCISION OF PROSTATE N/A 05/09/2020   Procedure: TRANSURETHRAL INCISION OF THE PROSTATE (TUIP);  Surgeon: Irine Seal, MD;  Location: [facility];  Service: Urology;  Laterality: N/A;    Short Social History:  Social History   Tobacco Use  . Smoking status: Former Smoker    Packs/day: 0.50    Years: 39.00    Pack years: 19.50    Types: Cigarettes    Quit date: 07/13/2020    Years since quitting: 0.3  . Smokeless tobacco: Never Used  Substance Use Topics  . Alcohol use: Never    Allergies  Allergen Reactions  . Methoxy Polyethylene Glycol-Epoetin Beta Other (See Comments)    Current Outpatient Medications  Medication Sig Dispense Refill  . albuterol (VENTOLIN HFA) 108 (90 Base) MCG/ACT inhaler Inhale 1-2 puffs into the lungs every 4 (four) hours as needed for wheezing or shortness of breath.    [name] amLODipine (NORVASC) 5 MG tablet Take 1 tablet (5 mg total) by mouth daily. 90 tablet 1  . atorvastatin (LIPITOR) 20 MG tablet Take 1 tablet (20 mg total) by mouth daily. 90 tablet 0  . azaTHIOprine (IMURAN) 50 MG tablet Take 1 tablet (50 mg total) by mouth daily. 270 tablet 0  . calcitRIOL (ROCALTROL) 0.25 MCG capsule Take 0.25 mcg by mouth daily.    . ferrous  sulfate 325 (65 FE) MG tablet Take 325 mg by mouth every Sunday.    . levothyroxine (SYNTHROID) 50 MCG tablet Take 1 tablet (50 mcg total) by mouth daily. 90 tablet 0  . metoprolol tartrate (LOPRESSOR) 25 MG tablet Take 0.5 tablets (12.5 mg total) by mouth 2 (two) times daily. 90 tablet 0  . pantoprazole (PROTONIX) 40 MG tablet Take 1 tablet (40 mg total) by mouth 2 (two) times daily. 60 tablet 2  . sevelamer (RENAGEL) 800 MG tablet Take 1 tablet (800 mg total) by mouth in the morning and at bedtime. 180 tablet 0  . tamsulosin (FLOMAX) 0.4 MG CAPS capsule Take 1 capsule (0.4 mg total) by mouth daily. 90 capsule 1  . terazosin (HYTRIN) 1 MG capsule Take 1 capsule (1 mg total) by mouth daily. 90 capsule 0  . tiotropium (SPIRIVA HANDIHALER) 18 MCG inhalation capsule Place  into inhaler and inhale.    . Vitamin D, Ergocalciferol, (DRISDOL) 1.25 MG (50000 UNIT) CAPS capsule Take 50,000 Units by mouth every Sunday.      No current facility-administered medications for this visit.    Review of Systems  Constitutional:  Constitutional negative. HENT: HENT negative.  Eyes: Eyes negative.  Respiratory: Respiratory negative.  Cardiovascular: Cardiovascular negative.  GI: Gastrointestinal negative.  Musculoskeletal: Musculoskeletal negative.  Skin: Skin negative.  Neurological: Neurological negative. Hematologic: Hematologic/lymphatic negative.  Psychiatric: Psychiatric negative.        Objective:  Objective   Vitals:   12/01/20 1353  BP: (!) 147/81  Pulse: (!) 103  Resp: 20  Temp: 98 F (36.7 C)  SpO2: 93%  Weight: 144 lb (65.3 kg)  Height: _0  (1.626 m)   Body mass index is 24.72 kg/m.  Physical Exam HENT:     Head: Normocephalic.     Nose:     Comments: Wearing a mask Eyes:     Pupils: Pupils are equal, round, and reactive to light.  Cardiovascular:     Pulses: Victor pulses.  Pulmonary:     Effort: Pulmonary effort is Victor.  Abdominal:     General: Abdomen is flat.   Musculoskeletal:        General: No swelling. Victor range of motion.  Skin:    General: Skin is warm.     Capillary Refill: Capillary refill takes less than 2 seconds.  Neurological:     General: No focal deficit present.     Mental Status: He is alert.  Psychiatric:        Mood and Affect: Mood Victor.        Behavior: Behavior Victor.        Thought Content: Thought content Victor.        Judgment: Judgment Victor.     Data: +-----------------+-------------+----------+---------+  Right Cephalic  Diameter (cm)Depth (cm)Findings   +-----------------+-------------+----------+---------+  Shoulder       0.28     1.01         +-----------------+-------------+----------+---------+  Prox upper arm    0.30     0.76         +-----------------+-------------+----------+---------+  Mid upper arm    0.33     0.34         +-----------------+-------------+----------+---------+  Dist upper arm    0.27     0.22         +-----------------+-------------+----------+---------+  Antecubital fossa  0.31     0.39         +-----------------+-------------+----------+---------+  Prox forearm     0.36     0.52  branching  +-----------------+-------------+----------+---------+  Mid forearm     0.29     0.32         +-----------------+-------------+----------+---------+  Dist forearm     0.37     0.29         +-----------------+-------------+----------+---------+  Wrist        0.34     0.26  branching  +-----------------+-------------+----------+---------+   +-----------------+-------------+----------+---------+  Right Basilic  Diameter (cm)Depth (cm)Findings   +-----------------+-------------+----------+---------+  Prox upper arm    0.38     1.11         +-----------------+-------------+----------+---------+  Mid upper arm     0.48     0.74         +-----------------+-------------+----------+---------+  Dist upper arm    0.54     0.51         +-----------------+-------------+----------+---------+  Antecubital fossa  0.36     0.64  branching  +-----------------+-------------+----------+---------+  Prox forearm     0.33     0.22         +-----------------+-------------+----------+---------+  Mid forearm     0.37     0.21         +-----------------+-------------+----------+---------+  Distal forearm    0.33     0.23  branching  +-----------------+-------------+----------+---------+  Wrist        0.31     0.32  branching  +-----------------+-------------+----------+---------+   +-----------------+-------------+----------+--------------+  Left Cephalic  Diameter (cm)Depth (cm)  Findings    +-----------------+-------------+----------+--------------+  Shoulder                 not visualized  +-----------------+-------------+----------+--------------+  Prox upper arm    0.40     0.81           +-----------------+-------------+----------+--------------+  Mid upper arm    0.47     0.53           +-----------------+-------------+----------+--------------+  Dist upper arm    0.48     0.65           +-----------------+-------------+----------+--------------+  Antecubital fossa  0.53     0.53           +-----------------+-------------+----------+--------------+  Prox forearm     0.53     0.18           +-----------------+-------------+----------+--------------+  Mid forearm                old graft    +-----------------+-------------+----------+--------------+  Dist forearm                old graft     +-----------------+-------------+----------+--------------+  Wrist                  not visualized  +-----------------+-------------+----------+--------------+   +-----------------+-------------+----------+---------+  Left Basilic   Diameter (cm)Depth (cm)Findings   +-----------------+-------------+----------+---------+  Prox upper arm    0.43     1.12         +-----------------+-------------+----------+---------+  Mid upper arm    0.47     1.03         +-----------------+-------------+----------+---------+  Dist upper arm    0.58     0.69         +-----------------+-------------+----------+---------+  Antecubital fossa  0.39     0.49  branching  +-----------------+-------------+----------+---------+  Prox forearm     0.34     0.23         +-----------------+-------------+----------+---------+  Mid forearm     0.35     0.17         +-----------------+-------------+----------+---------+  Distal forearm    0.23     0.30         +-----------------+-------------+----------+---------+  Wrist        0.22     0.32         +-----------------+-------------+----------+---------+      Assessment/Plan:     70 year old male with previous history of bilateral upper extremity access surgeries now on dialysis via catheter after having a functioning transplant for many years.  He is ambidextrous.  Veins appear somewhat more suitable on the left.  We will begin with left upper arm cephalic vein or basilic vein fistula.  We have discussed the need for 2 procedures should we proceed with basilic vein fistula.  We have also discussed the complications of primary failure of the fistula, need for further procedures for maturation and risk  of steal.  They demonstrate understanding we will get him scheduled on a nondialysis day in the near future.      Waynetta Sandy MD [facility]

## 2020-12-01 NOTE — H&P (View-Only) (Signed)
 Patient ID: Victor Schultz, male   DOB: 11/26/1950, 70 y.o.   MRN: [id number]  Reason for Consult: New Patient (Initial Visit)   Referred by Perry, Lawrence Edward,*  Subjective:     HPI:  Victor Schultz is a 70 y.o. male previously on dialysis he has had bilateral upper extremity fistulas in the left forearm AV graft.  He has had 2 previous transplants that have failed.  He now is on dialysis via a tunneled catheter.  He is dialyzing Tuesdays Thursdays and Saturdays.  He does not take blood thinners.  He has no issues with his hands other than a recent skin cancer removed from his right hand.  He is ambidextrous  Past Medical History:  Diagnosis Date  . Abnormal hemoglobin (Hgb) (HCC) 12/08/2019  . Actinic keratosis 04/15/2020  . Acute upper GI bleeding 12/22/2019  . Anemia 04/15/2020  . Atherosclerotic heart disease of native coronary artery with unstable angina pectoris (HCC)   . CAD in native artery 02/08/2016  . Cancer (HCC)    skin cancer on both arms.  . Chronic hepatitis (HCC) 04/05/2020  . Chronic kidney disease, stage 4 (severe) (HCC) 01/02/2020  . 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 Contact dermatitis and other eczema due to other chemical products 04/15/2020  . Disorder of mineral metabolism, unspecified 06/18/2020  . Dizziness 05/08/2020  . End stage renal disease (HCC) 04/15/2020  . Epigastric abdominal tenderness without rebound tenderness 01/02/2020  . Essential hypertension 12/22/2019  . Gastroesophageal reflux disease without esophagitis 12/06/2019  . Hypertensive heart and chronic kidney disease without heart failure, with stage 5 chronic kidney disease, or end stage renal disease (HCC)    ESR,kidney transplant  . Hypertensive heart and renal disease 01/02/2020  . Hypothyroidism   . Kidney transplant status, cadaveric 07/08/2017   Formatting of this note might be different from the original. In the 1970s and again in 1982  . Long term (current) use of insulin (HCC)   .  Metabolic acidosis 06/18/2020  . Mixed hyperlipidemia   . Obstructive uropathy 06/18/2020  . Personal history of malignant neoplasm of skin 10/09/2010   SQUAMOUS CELL CARCINOMA- R AND L FOREARM, LEFT EAR SQUAMOUS CELL CARCINOMA- R AND L FOREARM, LEFT EAR  Formatting of this note might be different from the original. SQUAMOUS CELL CARCINOMA- R AND L FOREARM, LEFT EAR  . Presence of aortocoronary bypass graft 02/08/2016   Formatting of this note might be different from the original. in Oct of 2013 Formatting of this note might be different from the original. Formatting of this note might be different from the original. in Oct of 2013 Oct of 2013  Formatting of this note might be different from the original. Oct of 2013 Formatting of this note might be different from the original. in Oct of 2013 Formatting of this n  . Pure hypercholesterolemia 06/15/2020  . S/P CABG x 3 02/08/2016   in Oct of 2013 Oct of 2013  Formatting of this note might be different from the original. Oct of 2013  . Serum potassium elevated 12/08/2019  . Short of breath on exertion 07/06/2020  . Squamous cell carcinoma of skin of ear and external auditory canal 11/16/2010   SCC on left helix treated with Mohs surgery SCC on left helix treated with Mohs surgery  Formatting of this note might be different from the original. Overview:  SCC on left helix treated with Mohs surgery  . Type 2 diabetes mellitus with chronic kidney disease, without   long-term current use of insulin (HCC) 12/06/2019  . Unspecified disorder of kidney and ureter 06/15/2020  . Vitamin D deficiency 02/08/2016   In Jan 2017, his vitamin D level was 11 In Jan 2017, his vitamin D level was 11  Formatting of this note might be different from the original. In Jan 2017, his vitamin D level was 11  . Weight gain 07/06/2020  . Wheezing 07/06/2020   Family History  Problem Relation Age of Onset  . Liver disease Mother   . Alzheimer's disease Father    Past Surgical History:   Procedure Laterality Date  . BIOPSY  05/24/2020   Procedure: BIOPSY;  Surgeon: Outlaw, William, MD;  Location: [facility];  Service: Endoscopy;;  . CATARACT EXTRACTION    . CORONARY ARTERY BYPASS GRAFT    . ESOPHAGOGASTRODUODENOSCOPY (EGD) WITH PROPOFOL Left 12/23/2019   Procedure: ESOPHAGOGASTRODUODENOSCOPY (EGD) WITH PROPOFOL;  Surgeon: Outlaw, William, MD;  Location: [facility];  Service: Endoscopy;  Laterality: Left;  . ESOPHAGOGASTRODUODENOSCOPY (EGD) WITH PROPOFOL N/A 05/24/2020   Procedure: ESOPHAGOGASTRODUODENOSCOPY (EGD) WITH PROPOFOL;  Surgeon: Outlaw, William, MD;  Location: [facility];  Service: Endoscopy;  Laterality: N/A;  . EXTERNAL EAR SURGERY Left 2021  . KIDNEY TRANSPLANT     1974, 1982  . TRANSURETHRAL INCISION OF PROSTATE N/A 05/09/2020   Procedure: TRANSURETHRAL INCISION OF THE PROSTATE (TUIP);  Surgeon: Wrenn, John, MD;  Location: [facility];  Service: Urology;  Laterality: N/A;    Short Social History:  Social History   Tobacco Use  . Smoking status: Former Smoker    Packs/day: 0.50    Years: 39.00    Pack years: 19.50    Types: Cigarettes    Quit date: 07/13/2020    Years since quitting: 0.3  . Smokeless tobacco: Never Used  Substance Use Topics  . Alcohol use: Never    Allergies  Allergen Reactions  . Methoxy Polyethylene Glycol-Epoetin Beta Other (See Comments)    Current Outpatient Medications  Medication Sig Dispense Refill  . albuterol (VENTOLIN HFA) 108 (90 Base) MCG/ACT inhaler Inhale 1-2 puffs into the lungs every 4 (four) hours as needed for wheezing or shortness of breath.    . amLODipine (NORVASC) 5 MG tablet Take 1 tablet (5 mg total) by mouth daily. 90 tablet 1  . atorvastatin (LIPITOR) 20 MG tablet Take 1 tablet (20 mg total) by mouth daily. 90 tablet 0  . azaTHIOprine (IMURAN) 50 MG tablet Take 1 tablet (50 mg total) by mouth daily. 270 tablet 0  . calcitRIOL (ROCALTROL) 0.25 MCG capsule Take 0.25 mcg by mouth daily.    . ferrous  sulfate 325 (65 FE) MG tablet Take 325 mg by mouth every Sunday.    . levothyroxine (SYNTHROID) 50 MCG tablet Take 1 tablet (50 mcg total) by mouth daily. 90 tablet 0  . metoprolol tartrate (LOPRESSOR) 25 MG tablet Take 0.5 tablets (12.5 mg total) by mouth 2 (two) times daily. 90 tablet 0  . pantoprazole (PROTONIX) 40 MG tablet Take 1 tablet (40 mg total) by mouth 2 (two) times daily. 60 tablet 2  . sevelamer (RENAGEL) 800 MG tablet Take 1 tablet (800 mg total) by mouth in the morning and at bedtime. 180 tablet 0  . tamsulosin (FLOMAX) 0.4 MG CAPS capsule Take 1 capsule (0.4 mg total) by mouth daily. 90 capsule 1  . terazosin (HYTRIN) 1 MG capsule Take 1 capsule (1 mg total) by mouth daily. 90 capsule 0  . tiotropium (SPIRIVA HANDIHALER) 18 MCG inhalation capsule Place   into inhaler and inhale.    . Vitamin D, Ergocalciferol, (DRISDOL) 1.25 MG (50000 UNIT) CAPS capsule Take 50,000 Units by mouth every Sunday.      No current facility-administered medications for this visit.    Review of Systems  Constitutional:  Constitutional negative. HENT: HENT negative.  Eyes: Eyes negative.  Respiratory: Respiratory negative.  Cardiovascular: Cardiovascular negative.  GI: Gastrointestinal negative.  Musculoskeletal: Musculoskeletal negative.  Skin: Skin negative.  Neurological: Neurological negative. Hematologic: Hematologic/lymphatic negative.  Psychiatric: Psychiatric negative.        Objective:  Objective   Vitals:   12/01/20 1353  BP: (!) 147/81  Pulse: (!) 103  Resp: 20  Temp: 98 F (36.7 C)  SpO2: 93%  Weight: 144 lb (65.3 kg)  Height: 5' 4" (1.626 m)   Body mass index is 24.72 kg/m.  Physical Exam HENT:     Head: Normocephalic.     Nose:     Comments: Wearing a mask Eyes:     Pupils: Pupils are equal, round, and reactive to light.  Cardiovascular:     Pulses: Normal pulses.  Pulmonary:     Effort: Pulmonary effort is normal.  Abdominal:     General: Abdomen is flat.   Musculoskeletal:        General: No swelling. Normal range of motion.  Skin:    General: Skin is warm.     Capillary Refill: Capillary refill takes less than 2 seconds.  Neurological:     General: No focal deficit present.     Mental Status: He is alert.  Psychiatric:        Mood and Affect: Mood normal.        Behavior: Behavior normal.        Thought Content: Thought content normal.        Judgment: Judgment normal.     Data: +-----------------+-------------+----------+---------+  Right Cephalic  Diameter (cm)Depth (cm)Findings   +-----------------+-------------+----------+---------+  Shoulder       0.28     1.01         +-----------------+-------------+----------+---------+  Prox upper arm    0.30     0.76         +-----------------+-------------+----------+---------+  Mid upper arm    0.33     0.34         +-----------------+-------------+----------+---------+  Dist upper arm    0.27     0.22         +-----------------+-------------+----------+---------+  Antecubital fossa  0.31     0.39         +-----------------+-------------+----------+---------+  Prox forearm     0.36     0.52  branching  +-----------------+-------------+----------+---------+  Mid forearm     0.29     0.32         +-----------------+-------------+----------+---------+  Dist forearm     0.37     0.29         +-----------------+-------------+----------+---------+  Wrist        0.34     0.26  branching  +-----------------+-------------+----------+---------+   +-----------------+-------------+----------+---------+  Right Basilic  Diameter (cm)Depth (cm)Findings   +-----------------+-------------+----------+---------+  Prox upper arm    0.38     1.11         +-----------------+-------------+----------+---------+  Mid upper arm     0.48     0.74         +-----------------+-------------+----------+---------+  Dist upper arm    0.54     0.51         +-----------------+-------------+----------+---------+    Antecubital fossa  0.36     0.64  branching  +-----------------+-------------+----------+---------+  Prox forearm     0.33     0.22         +-----------------+-------------+----------+---------+  Mid forearm     0.37     0.21         +-----------------+-------------+----------+---------+  Distal forearm    0.33     0.23  branching  +-----------------+-------------+----------+---------+  Wrist        0.31     0.32  branching  +-----------------+-------------+----------+---------+   +-----------------+-------------+----------+--------------+  Left Cephalic  Diameter (cm)Depth (cm)  Findings    +-----------------+-------------+----------+--------------+  Shoulder                 not visualized  +-----------------+-------------+----------+--------------+  Prox upper arm    0.40     0.81           +-----------------+-------------+----------+--------------+  Mid upper arm    0.47     0.53           +-----------------+-------------+----------+--------------+  Dist upper arm    0.48     0.65           +-----------------+-------------+----------+--------------+  Antecubital fossa  0.53     0.53           +-----------------+-------------+----------+--------------+  Prox forearm     0.53     0.18           +-----------------+-------------+----------+--------------+  Mid forearm                old graft    +-----------------+-------------+----------+--------------+  Dist forearm                old graft     +-----------------+-------------+----------+--------------+  Wrist                  not visualized  +-----------------+-------------+----------+--------------+   +-----------------+-------------+----------+---------+  Left Basilic   Diameter (cm)Depth (cm)Findings   +-----------------+-------------+----------+---------+  Prox upper arm    0.43     1.12         +-----------------+-------------+----------+---------+  Mid upper arm    0.47     1.03         +-----------------+-------------+----------+---------+  Dist upper arm    0.58     0.69         +-----------------+-------------+----------+---------+  Antecubital fossa  0.39     0.49  branching  +-----------------+-------------+----------+---------+  Prox forearm     0.34     0.23         +-----------------+-------------+----------+---------+  Mid forearm     0.35     0.17         +-----------------+-------------+----------+---------+  Distal forearm    0.23     0.30         +-----------------+-------------+----------+---------+  Wrist        0.22     0.32         +-----------------+-------------+----------+---------+      Assessment/Plan:     69-year-old male with previous history of bilateral upper extremity access surgeries now on dialysis via catheter after having a functioning transplant for many years.  He is ambidextrous.  Veins appear somewhat more suitable on the left.  We will begin with left upper arm cephalic vein or basilic vein fistula.  We have discussed the need for 2 procedures should we proceed with basilic vein fistula.  We have also discussed the complications of primary failure of the fistula, need for further procedures for maturation and risk   of steal.  They demonstrate understanding we will get him scheduled on a nondialysis day in the near future.      Taariq Leitz Christopher Loretta Doutt MD [facility]  

## 2020-12-02 DIAGNOSIS — E876 Hypokalemia: Secondary | ICD-10-CM | POA: Diagnosis not present

## 2020-12-02 DIAGNOSIS — N2581 Secondary hyperparathyroidism of renal origin: Secondary | ICD-10-CM | POA: Diagnosis not present

## 2020-12-02 DIAGNOSIS — N186 End stage renal disease: Secondary | ICD-10-CM | POA: Diagnosis not present

## 2020-12-02 DIAGNOSIS — D509 Iron deficiency anemia, unspecified: Secondary | ICD-10-CM | POA: Diagnosis not present

## 2020-12-02 DIAGNOSIS — D631 Anemia in chronic kidney disease: Secondary | ICD-10-CM | POA: Diagnosis not present

## 2020-12-02 DIAGNOSIS — Z992 Dependence on renal dialysis: Secondary | ICD-10-CM | POA: Diagnosis not present

## 2020-12-04 NOTE — Progress Notes (Signed)
  Patient Name: Victor Schultz MRN: [id number] DOB: 11/26/1950 Referring Physician: Reinaldo Meeker (Profile Not Attached) Date of Service: 11/28/2020 [facility] [place]                                                        End Of Treatment Note  Diagnoses: C34.32-Malignant neoplasm of lower lobe, left bronchus or lung  Cancer Staging: Putative Stage IA3, cT1cN0M0, NSCLC of the LLL.  Intent: Curative  Radiation Treatment Dates: 11/15/2020 through 11/28/2020 Site Technique Total Dose (Gy) Dose per Fx (Gy) Completed Fx Beam Energies  Lung, Left: Lung_Lt IMRT 60/60 6 10/10 6XFFF   Narrative: The patient tolerated radiation therapy relatively well. His SBRT treatment did change to a 10 fraction course to account for motion.  Plan: The patient will receive a call in about one month from the radiation oncology department. He will continue follow up with Dr. Hinton Rao as well for long term surveillance. For this reason we will not order additional imaging.  ________________________________________________    Carola Rhine, PAC

## 2020-12-05 ENCOUNTER — Other Ambulatory Visit: Payer: Self-pay | Admitting: Oncology

## 2020-12-05 DIAGNOSIS — Z992 Dependence on renal dialysis: Secondary | ICD-10-CM | POA: Diagnosis not present

## 2020-12-05 DIAGNOSIS — E876 Hypokalemia: Secondary | ICD-10-CM | POA: Diagnosis not present

## 2020-12-05 DIAGNOSIS — C3432 Malignant neoplasm of lower lobe, left bronchus or lung: Secondary | ICD-10-CM

## 2020-12-05 DIAGNOSIS — T8610 Unspecified complication of kidney transplant: Secondary | ICD-10-CM | POA: Diagnosis not present

## 2020-12-05 DIAGNOSIS — N2581 Secondary hyperparathyroidism of renal origin: Secondary | ICD-10-CM | POA: Diagnosis not present

## 2020-12-05 DIAGNOSIS — D509 Iron deficiency anemia, unspecified: Secondary | ICD-10-CM | POA: Diagnosis not present

## 2020-12-05 DIAGNOSIS — D631 Anemia in chronic kidney disease: Secondary | ICD-10-CM | POA: Diagnosis not present

## 2020-12-05 DIAGNOSIS — N186 End stage renal disease: Secondary | ICD-10-CM | POA: Diagnosis not present

## 2020-12-06 DIAGNOSIS — C44622 Squamous cell carcinoma of skin of right upper limb, including shoulder: Secondary | ICD-10-CM | POA: Diagnosis not present

## 2020-12-07 DIAGNOSIS — Z992 Dependence on renal dialysis: Secondary | ICD-10-CM | POA: Diagnosis not present

## 2020-12-07 DIAGNOSIS — N2581 Secondary hyperparathyroidism of renal origin: Secondary | ICD-10-CM | POA: Diagnosis not present

## 2020-12-07 DIAGNOSIS — E876 Hypokalemia: Secondary | ICD-10-CM | POA: Diagnosis not present

## 2020-12-07 DIAGNOSIS — N186 End stage renal disease: Secondary | ICD-10-CM | POA: Diagnosis not present

## 2020-12-07 DIAGNOSIS — D509 Iron deficiency anemia, unspecified: Secondary | ICD-10-CM | POA: Diagnosis not present

## 2020-12-07 DIAGNOSIS — D631 Anemia in chronic kidney disease: Secondary | ICD-10-CM | POA: Diagnosis not present

## 2020-12-09 DIAGNOSIS — D631 Anemia in chronic kidney disease: Secondary | ICD-10-CM | POA: Diagnosis not present

## 2020-12-09 DIAGNOSIS — Z992 Dependence on renal dialysis: Secondary | ICD-10-CM | POA: Diagnosis not present

## 2020-12-09 DIAGNOSIS — E876 Hypokalemia: Secondary | ICD-10-CM | POA: Diagnosis not present

## 2020-12-09 DIAGNOSIS — N186 End stage renal disease: Secondary | ICD-10-CM | POA: Diagnosis not present

## 2020-12-09 DIAGNOSIS — D509 Iron deficiency anemia, unspecified: Secondary | ICD-10-CM | POA: Diagnosis not present

## 2020-12-09 DIAGNOSIS — N2581 Secondary hyperparathyroidism of renal origin: Secondary | ICD-10-CM | POA: Diagnosis not present

## 2020-12-12 DIAGNOSIS — D509 Iron deficiency anemia, unspecified: Secondary | ICD-10-CM | POA: Diagnosis not present

## 2020-12-12 DIAGNOSIS — N186 End stage renal disease: Secondary | ICD-10-CM | POA: Diagnosis not present

## 2020-12-12 DIAGNOSIS — N2581 Secondary hyperparathyroidism of renal origin: Secondary | ICD-10-CM | POA: Diagnosis not present

## 2020-12-12 DIAGNOSIS — D631 Anemia in chronic kidney disease: Secondary | ICD-10-CM | POA: Diagnosis not present

## 2020-12-12 DIAGNOSIS — Z992 Dependence on renal dialysis: Secondary | ICD-10-CM | POA: Diagnosis not present

## 2020-12-12 DIAGNOSIS — E876 Hypokalemia: Secondary | ICD-10-CM | POA: Diagnosis not present

## 2020-12-14 DIAGNOSIS — N2581 Secondary hyperparathyroidism of renal origin: Secondary | ICD-10-CM | POA: Diagnosis not present

## 2020-12-14 DIAGNOSIS — N186 End stage renal disease: Secondary | ICD-10-CM | POA: Diagnosis not present

## 2020-12-14 DIAGNOSIS — D631 Anemia in chronic kidney disease: Secondary | ICD-10-CM | POA: Diagnosis not present

## 2020-12-14 DIAGNOSIS — D509 Iron deficiency anemia, unspecified: Secondary | ICD-10-CM | POA: Diagnosis not present

## 2020-12-14 DIAGNOSIS — Z992 Dependence on renal dialysis: Secondary | ICD-10-CM | POA: Diagnosis not present

## 2020-12-14 DIAGNOSIS — E876 Hypokalemia: Secondary | ICD-10-CM | POA: Diagnosis not present

## 2020-12-16 DIAGNOSIS — Z992 Dependence on renal dialysis: Secondary | ICD-10-CM | POA: Diagnosis not present

## 2020-12-16 DIAGNOSIS — D509 Iron deficiency anemia, unspecified: Secondary | ICD-10-CM | POA: Diagnosis not present

## 2020-12-16 DIAGNOSIS — E876 Hypokalemia: Secondary | ICD-10-CM | POA: Diagnosis not present

## 2020-12-16 DIAGNOSIS — D631 Anemia in chronic kidney disease: Secondary | ICD-10-CM | POA: Diagnosis not present

## 2020-12-16 DIAGNOSIS — N186 End stage renal disease: Secondary | ICD-10-CM | POA: Diagnosis not present

## 2020-12-16 DIAGNOSIS — N2581 Secondary hyperparathyroidism of renal origin: Secondary | ICD-10-CM | POA: Diagnosis not present

## 2020-12-18 ENCOUNTER — Other Ambulatory Visit: Payer: Self-pay | Admitting: Legal Medicine

## 2020-12-18 ENCOUNTER — Other Ambulatory Visit (HOSPITAL_COMMUNITY)
Admission: RE | Admit: 2020-12-18 | Discharge: 2020-12-18 | Disposition: A | Discharge status: Home / Self Care | Payer: Medicare Other | Source: Ambulatory Visit | Attending: Vascular Surgery | Admitting: Vascular Surgery

## 2020-12-18 DIAGNOSIS — Z01812 Encounter for preprocedural laboratory examination: Secondary | ICD-10-CM | POA: Diagnosis not present

## 2020-12-18 DIAGNOSIS — Z20822 Contact with and (suspected) exposure to covid-19: Secondary | ICD-10-CM | POA: Diagnosis not present

## 2020-12-18 LAB — SARS CORONAVIRUS 2 (TAT 6-24 HRS): SARS Coronavirus 2: NEGATIVE

## 2020-12-19 ENCOUNTER — Encounter (HOSPITAL_COMMUNITY): Payer: Self-pay | Admitting: Vascular Surgery

## 2020-12-19 ENCOUNTER — Other Ambulatory Visit: Payer: Self-pay

## 2020-12-19 DIAGNOSIS — D631 Anemia in chronic kidney disease: Secondary | ICD-10-CM | POA: Diagnosis not present

## 2020-12-19 DIAGNOSIS — N186 End stage renal disease: Secondary | ICD-10-CM | POA: Diagnosis not present

## 2020-12-19 DIAGNOSIS — D509 Iron deficiency anemia, unspecified: Secondary | ICD-10-CM | POA: Diagnosis not present

## 2020-12-19 DIAGNOSIS — Z992 Dependence on renal dialysis: Secondary | ICD-10-CM | POA: Diagnosis not present

## 2020-12-19 DIAGNOSIS — N2581 Secondary hyperparathyroidism of renal origin: Secondary | ICD-10-CM | POA: Diagnosis not present

## 2020-12-19 DIAGNOSIS — E876 Hypokalemia: Secondary | ICD-10-CM | POA: Diagnosis not present

## 2020-12-19 NOTE — Progress Notes (Signed)
Victor Schultz denies chest pain or shortness of breath. Victor Schultz that he is not taking medications at all. I asked patient why, he said "I have my reasons."  Victor Schultz said that a [facility] MD is aware, per patient , the Dr. said we are going to go over that when I see you again.  (patient sees Dr. while he is on dialysis.)  Victor Schultz states that his labsawn at dialysis hasv been good.  Victor Schultz has type II diabetes- no medications. Patient states he checks CBG two times a week- CBGs run 130 -140. I instructed patient to check CBG after awaking and every 2 hours until arrival  to the hospital.  I Instructed patient if CBG is less than 70 to drink 1/2 cup  juice. Recheck CBG in 15 minutes if CBG is not over 70 call, pre- op desk at [phone] for further instructions.

## 2020-12-20 ENCOUNTER — Ambulatory Visit (HOSPITAL_COMMUNITY)
Admission: RE | Admit: 2020-12-20 | Discharge: 2020-12-20 | Disposition: A | Discharge status: Home / Self Care | Payer: Medicare Other | Attending: Vascular Surgery | Admitting: Vascular Surgery

## 2020-12-20 ENCOUNTER — Other Ambulatory Visit: Payer: Self-pay

## 2020-12-20 ENCOUNTER — Encounter (HOSPITAL_COMMUNITY)
Admission: RE | Disposition: A | Discharge status: Home / Self Care | Payer: Self-pay | Source: Home / Self Care | Attending: Vascular Surgery

## 2020-12-20 ENCOUNTER — Encounter (HOSPITAL_COMMUNITY): Payer: Self-pay | Admitting: Vascular Surgery

## 2020-12-20 ENCOUNTER — Ambulatory Visit (HOSPITAL_COMMUNITY): Payer: Medicare Other | Admitting: Certified Registered Nurse Anesthetist

## 2020-12-20 DIAGNOSIS — Z992 Dependence on renal dialysis: Secondary | ICD-10-CM | POA: Insufficient documentation

## 2020-12-20 DIAGNOSIS — Z87891 Personal history of nicotine dependence: Secondary | ICD-10-CM | POA: Insufficient documentation

## 2020-12-20 DIAGNOSIS — Z79890 Hormone replacement therapy (postmenopausal): Secondary | ICD-10-CM | POA: Insufficient documentation

## 2020-12-20 DIAGNOSIS — E1122 Type 2 diabetes mellitus with diabetic chronic kidney disease: Secondary | ICD-10-CM | POA: Diagnosis not present

## 2020-12-20 DIAGNOSIS — I120 Hypertensive chronic kidney disease with stage 5 chronic kidney disease or end stage renal disease: Secondary | ICD-10-CM | POA: Diagnosis not present

## 2020-12-20 DIAGNOSIS — Z940 Kidney transplant status: Secondary | ICD-10-CM | POA: Insufficient documentation

## 2020-12-20 DIAGNOSIS — Z79899 Other long term (current) drug therapy: Secondary | ICD-10-CM | POA: Diagnosis not present

## 2020-12-20 DIAGNOSIS — N186 End stage renal disease: Secondary | ICD-10-CM | POA: Insufficient documentation

## 2020-12-20 DIAGNOSIS — N185 Chronic kidney disease, stage 5: Secondary | ICD-10-CM | POA: Diagnosis not present

## 2020-12-20 DIAGNOSIS — I1311 Hypertensive heart and chronic kidney disease without heart failure, with stage 5 chronic kidney disease, or end stage renal disease: Secondary | ICD-10-CM | POA: Diagnosis not present

## 2020-12-20 HISTORY — PX: AV FISTULA PLACEMENT: SHX1204

## 2020-12-20 HISTORY — DX: Personal history of other medical treatment: Z92.89

## 2020-12-20 HISTORY — DX: Unspecified osteoarthritis, unspecified site: M19.90

## 2020-12-20 HISTORY — DX: Depression, unspecified: F32.A

## 2020-12-20 LAB — POCT I-STAT, CHEM 8
BUN: 24 mg/dL — ABNORMAL HIGH (ref 8–23)
Calcium, Ion: 1.07 mmol/L — ABNORMAL LOW (ref 1.15–1.40)
Chloride: 98 mmol/L (ref 98–111)
Creatinine, Ser: 3.9 mg/dL — ABNORMAL HIGH (ref 0.61–1.24)
Glucose, Bld: 181 mg/dL — ABNORMAL HIGH (ref 70–99)
HCT: 31 % — ABNORMAL LOW (ref 39.0–52.0)
Hemoglobin: 10.5 g/dL — ABNORMAL LOW (ref 13.0–17.0)
Potassium: 3.1 mmol/L — ABNORMAL LOW (ref 3.5–5.1)
Sodium: 138 mmol/L (ref 135–145)
TCO2: 27 mmol/L (ref 22–32)

## 2020-12-20 LAB — GLUCOSE, CAPILLARY
Glucose-Capillary: 203 mg/dL — ABNORMAL HIGH (ref 70–99)
Glucose-Capillary: 123 mg/dL — ABNORMAL HIGH (ref 70–99)
Glucose-Capillary: 87 mg/dL (ref 70–99)

## 2020-12-20 SURGERY — ARTERIOVENOUS (AV) FISTULA CREATION
Anesthesia: Monitor Anesthesia Care | Site: Arm Upper | Laterality: Left

## 2020-12-20 MED ORDER — SODIUM CHLORIDE 0.9 % IV SOLN
INTRAVENOUS | Status: AC
  Filled 2020-12-20: qty 1.2

## 2020-12-20 MED ORDER — CHLORHEXIDINE GLUCONATE 4 % EX LIQD: 60.0000 mL | Freq: Once | CUTANEOUS | Status: DC

## 2020-12-20 MED ORDER — SODIUM CHLORIDE 0.9 % IV SOLN
INTRAVENOUS | Status: DC | PRN
  Administered 2020-12-20: 500 mL

## 2020-12-20 MED ORDER — OXYCODONE HCL 5 MG/5ML PO SOLN: 5.0000 mg | Freq: Once | ORAL | Status: DC | PRN

## 2020-12-20 MED ORDER — HYDROCODONE-ACETAMINOPHEN 5-325 MG PO TABS: 1.0000 | ORAL_TABLET | Freq: Four times a day (QID) | ORAL | 0 refills | Status: DC | PRN

## 2020-12-20 MED ORDER — LIDOCAINE-EPINEPHRINE 0.5 %-1:200000 IJ SOLN
INTRAMUSCULAR | Status: DC | PRN
  Administered 2020-12-20: 2 mL

## 2020-12-20 MED ORDER — ACETAMINOPHEN 10 MG/ML IV SOLN: 1000.0000 mg | Freq: Once | INTRAVENOUS | Status: DC | PRN

## 2020-12-20 MED ORDER — CHLORHEXIDINE GLUCONATE 0.12 % MT SOLN
OROMUCOSAL | Status: AC
  Filled 2020-12-20: qty 15

## 2020-12-20 MED ORDER — SODIUM CHLORIDE 0.9 % IV SOLN: INTRAVENOUS | Status: DC

## 2020-12-20 MED ORDER — FENTANYL CITRATE (PF) 250 MCG/5ML IJ SOLN
INTRAMUSCULAR | Status: AC
  Filled 2020-12-20: qty 5

## 2020-12-20 MED ORDER — PROPOFOL 10 MG/ML IV BOLUS
INTRAVENOUS | Status: DC | PRN
  Administered 2020-12-20: 20 mg via INTRAVENOUS

## 2020-12-20 MED ORDER — ACETAMINOPHEN 160 MG/5ML PO SOLN: 1000.0000 mg | Freq: Once | ORAL | Status: DC | PRN

## 2020-12-20 MED ORDER — PROPOFOL 500 MG/50ML IV EMUL
INTRAVENOUS | Status: DC | PRN
  Administered 2020-12-20: 100 ug/kg/min via INTRAVENOUS

## 2020-12-20 MED ORDER — 0.9 % SODIUM CHLORIDE (POUR BTL) OPTIME
TOPICAL | Status: DC | PRN
  Administered 2020-12-20: 1000 mL

## 2020-12-20 MED ORDER — PHENYLEPHRINE HCL-NACL 10-0.9 MG/250ML-% IV SOLN
INTRAVENOUS | Status: DC | PRN
  Administered 2020-12-20: 20 ug/min via INTRAVENOUS

## 2020-12-20 MED ORDER — ACETAMINOPHEN 500 MG PO TABS: 1000.0000 mg | ORAL_TABLET | Freq: Once | ORAL | Status: DC | PRN

## 2020-12-20 MED ORDER — OXYCODONE HCL 5 MG PO TABS: 5.0000 mg | ORAL_TABLET | Freq: Once | ORAL | Status: DC | PRN

## 2020-12-20 MED ORDER — PHENYLEPHRINE 40 MCG/ML (10ML) SYRINGE FOR IV PUSH (FOR BLOOD PRESSURE SUPPORT)
PREFILLED_SYRINGE | INTRAVENOUS | Status: DC | PRN
  Administered 2020-12-20: 80 ug via INTRAVENOUS
  Administered 2020-12-20 (×2): 40 ug via INTRAVENOUS

## 2020-12-20 MED ORDER — FENTANYL CITRATE (PF) 250 MCG/5ML IJ SOLN
INTRAMUSCULAR | Status: DC | PRN
  Administered 2020-12-20 (×4): 25 ug via INTRAVENOUS

## 2020-12-20 MED ORDER — PROPOFOL 10 MG/ML IV BOLUS
INTRAVENOUS | Status: AC
  Filled 2020-12-20: qty 20

## 2020-12-20 MED ORDER — CEFAZOLIN SODIUM-DEXTROSE 2-4 GM/100ML-% IV SOLN
2.0000 g | INTRAVENOUS | Status: AC
  Administered 2020-12-20: 2 g via INTRAVENOUS
  Filled 2020-12-20: qty 100

## 2020-12-20 MED ORDER — LIDOCAINE 2% (20 MG/ML) 5 ML SYRINGE
INTRAMUSCULAR | Status: DC | PRN
  Administered 2020-12-20: 60 mg via INTRAVENOUS

## 2020-12-20 MED ORDER — LIDOCAINE-EPINEPHRINE 0.5 %-1:200000 IJ SOLN
INTRAMUSCULAR | Status: AC
  Filled 2020-12-20: qty 1

## 2020-12-20 MED ORDER — FENTANYL CITRATE (PF) 100 MCG/2ML IJ SOLN: 25.0000 ug | INTRAMUSCULAR | Status: DC | PRN

## 2020-12-20 SURGICAL SUPPLY — 29 items
ARMBAND PINK RESTRICT EXTREMIT (MISCELLANEOUS) ×2 IMPLANT
CANISTER SUCT 3000ML PPV (MISCELLANEOUS) ×2 IMPLANT
CANNULA VESSEL 3MM 2 BLNT TIP (CANNULA) ×2 IMPLANT
CLIP LIGATING EXTRA MED SLVR (CLIP) ×2 IMPLANT
CLIP LIGATING EXTRA SM BLUE (MISCELLANEOUS) ×2 IMPLANT
COVER PROBE W GEL 5X96 (DRAPES) ×2 IMPLANT
COVER WAND RF STERILE (DRAPES) IMPLANT
DECANTER SPIKE VIAL GLASS SM (MISCELLANEOUS) ×2 IMPLANT
DERMABOND ADVANCED (GAUZE/BANDAGES/DRESSINGS) ×1
DERMABOND ADVANCED .7 DNX12 (GAUZE/BANDAGES/DRESSINGS) ×1 IMPLANT
ELECT REM PT RETURN 9FT ADLT (ELECTROSURGICAL) ×2
ELECTRODE REM PT RTRN 9FT ADLT (ELECTROSURGICAL) ×1 IMPLANT
GLOVE SS BIOGEL STRL SZ 7.5 (GLOVE) ×1 IMPLANT
GLOVE SUPERSENSE BIOGEL SZ 7.5 (GLOVE) ×1
GLOVE SURG SYN 7.5  E (GLOVE) ×1
GLOVE SURG SYN 7.5 E (GLOVE) ×1 IMPLANT
GOWN STRL REUS W/ TWL LRG LVL3 (GOWN DISPOSABLE) ×3 IMPLANT
GOWN STRL REUS W/TWL LRG LVL3 (GOWN DISPOSABLE) ×3
KIT BASIN OR (CUSTOM PROCEDURE TRAY) ×2 IMPLANT
KIT TURNOVER KIT B (KITS) ×2 IMPLANT
NS IRRIG 1000ML POUR BTL (IV SOLUTION) ×2 IMPLANT
PACK CV ACCESS (CUSTOM PROCEDURE TRAY) ×2 IMPLANT
PAD ARMBOARD 7.5X6 YLW CONV (MISCELLANEOUS) ×4 IMPLANT
SUT PROLENE 6 0 CC (SUTURE) ×4 IMPLANT
SUT VIC AB 3-0 SH 27 (SUTURE) ×2
SUT VIC AB 3-0 SH 27X BRD (SUTURE) ×2 IMPLANT
TOWEL GREEN STERILE (TOWEL DISPOSABLE) ×2 IMPLANT
UNDERPAD 30X36 HEAVY ABSORB (UNDERPADS AND DIAPERS) ×2 IMPLANT
WATER STERILE IRR 1000ML POUR (IV SOLUTION) ×2 IMPLANT

## 2020-12-20 NOTE — Interval H&P Note (Signed)
History and Physical Interval Note:  12/20/2020 8:16 AM  Victor Schultz  has presented today for surgery, with the diagnosis of ESRD.  The various methods of treatment have been discussed with the patient and family. After consideration of risks, benefits and other options for treatment, the patient has consented to  Procedure(s): LEFT ARTERIOVENOUS (AV) FISTULA CREATION VERSUS GRAFT (Left) as a surgical intervention.  The patient's history has been reviewed, patient examined, no change in status, stable for surgery.  I have reviewed the patient's chart and labs.  Questions were answered to the patient's satisfaction.     Curt Jews

## 2020-12-20 NOTE — Discharge Instructions (Signed)
   [facility]  Discharge Instructions  AV Fistula or Graft Surgery for Dialysis Access  Please refer to the following instructions for your post-procedure care. Your surgeon or physician assistant will discuss any changes with you.  Activity  You may drive the day following your surgery, if you are comfortable and no longer taking prescription pain medication. Resume full activity as the soreness in your incision resolves.  Bathing/Showering  You may shower after you go home. Keep your incision dry for 48 hours. Do not soak in a bathtub, hot tub, or swim until the incision heals completely. You may not shower if you have a hemodialysis catheter.  Incision Care  Clean your incision with mild soap and water after 48 hours. Pat the area dry with a clean towel. You do not need a bandage unless otherwise instructed. Do not apply any ointments or creams to your incision. You may have skin glue on your incision. Do not peel it off. It will come off on its own in about one week. Your arm may swell a bit after surgery. To reduce swelling use pillows to elevate your arm so it is above your heart. Your doctor will tell you if you need to lightly wrap your arm with an ACE bandage.  Diet  Resume your normal diet. There are not special food restrictions following this procedure. In order to heal from your surgery, it is CRITICAL to get adequate nutrition. Your body requires vitamins, minerals, and protein. Vegetables are the best source of vitamins and minerals. Vegetables also provide the perfect balance of protein. Processed food has little nutritional value, so try to avoid this.  Medications  Resume taking all of your medications. If your incision is causing pain, you may take over-the counter pain relievers such as acetaminophen (Tylenol). If you were prescribed a stronger pain medication, please be aware these medications can cause nausea and constipation. Prevent  nausea by taking the medication with a snack or meal. Avoid constipation by drinking plenty of fluids and eating foods with high amount of fiber, such as fruits, vegetables, and grains.  Do not take Tylenol if you are taking prescription pain medications.  Follow up Your surgeon may want to see you in the office following your access surgery. If so, this will be arranged at the time of your surgery.  Please call us immediately for any of the following conditions:  . Increased pain, redness, drainage (pus) from your incision site . Fever of 101 degrees or higher . Severe or worsening pain at your incision site . Hand pain or numbness. .  Reduce your risk of vascular disease:  . Stop smoking. If you would like help, call [facility] at 1-800-QUIT-NOW 804-473-7759) or [place] at (959) 826-2112  . Manage your cholesterol . Maintain a desired weight . Control your diabetes . Keep your blood pressure down  Dialysis  It will take several weeks to several months for your new dialysis access to be ready for use. Your surgeon will determine when it is okay to use it. Your nephrologist will continue to direct your dialysis. You can continue to use your Permcath until your new access is ready for use.   12/20/2020 Victor Schultz [id number] 11-Jul-1951  Surgeon(s): Early, Arvilla Meres, MD  Procedure(s): LEFT ARTERIOVENOUS (AV) FISTULA CREATION  x Do not stick fistula for 12 weeks    If you have any questions, please call the office at [phone].

## 2020-12-20 NOTE — Anesthesia Procedure Notes (Signed)
Procedure Name: MAC Date/Time: 12/20/2020 10:32 AM Performed by: Dorthea Cove, CRNA Pre-anesthesia Checklist: Patient identified, Emergency Drugs available, Suction available, Patient being monitored and Timeout performed Patient Re-evaluated:Patient Re-evaluated prior to induction Oxygen Delivery Method: Simple face mask Preoxygenation: Pre-oxygenation with 100% oxygen Induction Type: IV induction Placement Confirmation: positive ETCO2 Dental Injury: Teeth and Oropharynx as per pre-operative assessment

## 2020-12-20 NOTE — Interval H&P Note (Signed)
History and Physical Interval Note:  12/20/2020 8:18 AM  Victor Schultz  has presented today for surgery, with the diagnosis of ESRD.  The various methods of treatment have been discussed with the patient and family. After consideration of risks, benefits and other options for treatment, the patient has consented to  Procedure(s): LEFT ARTERIOVENOUS (AV) FISTULA CREATION VERSUS GRAFT (Left) as a surgical intervention.  The patient's history has been reviewed, patient examined, no change in status, stable for surgery.  I have reviewed the patient's chart and labs.  Questions were answered to the patient's satisfaction.     Curt Jews

## 2020-12-20 NOTE — Op Note (Signed)
    OPERATIVE REPORT  DATE OF SURGERY: 12/20/2020  PATIENT: Victor Schultz, 70 y.o. male MRN: [id number]  DOB: 20-Sep-1951  PRE-OPERATIVE DIAGNOSIS: End-stage renal disease  POST-OPERATIVE DIAGNOSIS:  Same  PROCEDURE: Left upper arm AV fistula creation, brachiobasilic  SURGEON:  Curt Jews, M.D.  PHYSICIAN ASSISTANT: Nurse  The assistant was needed for exposure and to expedite the case  ANESTHESIA: Local with sedation  EBL: per anesthesia record  Total I/O In: 400 [I.V.:400] Out: 20 [Blood:20]  BLOOD ADMINISTERED: none  DRAINS: none  SPECIMEN: none  COUNTS CORRECT:  YES  PATIENT DISPOSITION:  PACU - hemodynamically stable  PROCEDURE DETAILS: Patient was taken to room placed position with area of the left arm prepped draped in sterile fashion.  SonoSite ultrasound was used to visualize the vein in the upper arm.  The patient had an old forearm loop AV Gore-Tex graft which had been nonfunctional for many years.  The patient had very small cephalic vein.  Had a large basilic vein.  Incision was made over the antecubital space using local anesthesia and carried down to isolate the basilic vein which actually had been anastomosed to the Gore-Tex graft at the antecubital space.  This was ligated at the old Gore-Tex graft.  Tributary branches were ligated and divided.  The brachial artery was exposed at the antecubital space.  The artery was exposed proximal distal to the old Gore-Tex anastomosis.  The artery was occluded proximal distal to the old anastomosis and the old anastomosis was excised.  The basilic vein was brought into approximation of the brachial artery and was sewn end-to-side to the artery with a running 6-0 Prolene suture.  Clamps removed and excellent thrill was noted.  The wounds irrigated with saline.  Hemostasis electrocautery.  Wounds were closed with 3-0 Vicryl in the subcutaneous subcuticular tissue.  Sterile dressing was applied and the patient was transferred to  the recovery room in stable condition  He will be seen in the office in 4 to 6 weeks to plan second stage transposition   Rosetta Posner, M.D., [facility] 12/20/2020 11:44 AM  Note: Portions of this report may have been transcribed using voice recognition software.  Every effort has been made to ensure accuracy; however, inadvertent computerized transcription errors may still be present.

## 2020-12-20 NOTE — Transfer of Care (Signed)
Immediate Anesthesia Transfer of Care Note  Patient: Victor Schultz  Procedure(s) Performed: LEFT ARTERIOVENOUS (AV) FISTULA CREATION (Left Arm Upper)  Patient Location: PACU  Anesthesia Type:MAC  Level of Consciousness: drowsy and patient cooperative  Airway & Oxygen Therapy: Patient Spontanous Breathing and Patient connected to face mask oxygen  Post-op Assessment: Report given to RN and Post -op Vital signs reviewed and stable  Post vital signs: Reviewed  Last Vitals:  Vitals Value Taken Time  BP 123/55 12/20/20 1155  Temp    Pulse 91 12/20/20 1157  Resp 24 12/20/20 1157  SpO2 100 % 12/20/20 1157  Vitals shown include unvalidated device data.  Last Pain:  Vitals:   12/20/20 0804  TempSrc:   PainSc: 0-No pain         Complications: No complications documented.

## 2020-12-20 NOTE — Anesthesia Preprocedure Evaluation (Signed)
Anesthesia Evaluation  Patient identified by MRN, date of birth, ID band Patient awake    Reviewed: Allergy & Precautions, NPO status , Patient's Chart, lab work & pertinent test results  History of Anesthesia Complications Negative for: history of anesthetic complications  Airway Mallampati: II  TM Distance: >3 FB Neck ROM: Full    Dental  (+) Upper Dentures, Lower Dentures, Edentulous Upper, Edentulous Lower   Pulmonary neg shortness of breath, neg sleep apnea, COPD, neg recent URI, former smoker,  Covid-19 Nucleic Acid Test Results Lab Results      Component                Value               Date                      SARSCOV2NAA              NEGATIVE            12/18/2020                SARSCOV2NAA              POSITIVE (A)        10/13/2020                SARSCOV2NAA              NEGATIVE            05/20/2020                SARSCOV2NAA              NEGATIVE            05/05/2020                SARSCOV2NAA              NEGATIVE            12/22/2019                SARSCOV2                 POSITIVE (A)        12/22/2019                SARSCOV2                                     12/22/2019            THIS TEST WAS ORDERED IN ERROR AND HAS BEEN CREDITED. (A)    breath sounds clear to auscultation       Cardiovascular hypertension, Pt. on medications and Pt. on home beta blockers (-) angina+ CAD and + CABG   Rhythm:Regular  1. Left ventricular ejection fraction, by estimation, is 55 to 60%. The  left ventricle has normal function. The left ventricle has no regional  wall motion abnormalities. There is moderate left ventricular hypertrophy.  Left ventricular diastolic  parameters are indeterminate.  2. Right ventricular systolic function is normal. The right ventricular  size is normal. There is normal pulmonary artery systolic pressure.  3. The mitral valve is grossly normal. Mild to moderate mitral valve  regurgitation.   4. The aortic valve is normal in structure. Aortic valve regurgitation is  trivial.    Neuro/Psych Seizures -, Well Controlled,  PSYCHIATRIC  DISORDERS Depression No current meds    GI/Hepatic GERD  Medicated and Controlled,(+) Hepatitis -  Endo/Other  diabetesHypothyroidism   Renal/GU ESRF and DialysisRenal diseaseLast HD 3/15  Lab Results      Component                Value               Date                      CREATININE               3.90 (H)            12/20/2020           Lab Results      Component                Value               Date                      K                        3.1 (L)             12/20/2020           \     Musculoskeletal  (+) Arthritis ,   Abdominal   Peds  Hematology  (+) Blood dyscrasia, anemia , Lab Results      Component                Value               Date                      WBC                      4.1                 10/16/2020                HGB                      10.5 (L)            12/20/2020                HCT                      31.0 (L)            12/20/2020                MCV                      100.4 (H)           10/16/2020                PLT                      183                 10/16/2020              Anesthesia Other Findings   Reproductive/Obstetrics  Anesthesia Physical Anesthesia Plan  ASA: IV  Anesthesia Plan: MAC   Post-op Pain Management:    Induction: Intravenous  PONV Risk Score and Plan: 1 and Propofol infusion and Treatment may vary due to age or medical condition  Airway Management Planned: Nasal Cannula  Additional Equipment: None  Intra-op Plan:   Post-operative Plan:   Informed Consent: I have reviewed the patients History and Physical, chart, labs and discussed the procedure including the risks, benefits and alternatives for the proposed anesthesia with the patient or authorized representative who has indicated his/her  understanding and acceptance.     Dental advisory given  Plan Discussed with: Surgeon and CRNA  Anesthesia Plan Comments:         Anesthesia Quick Evaluation

## 2020-12-21 ENCOUNTER — Encounter (HOSPITAL_COMMUNITY): Payer: Self-pay | Admitting: Vascular Surgery

## 2020-12-21 DIAGNOSIS — N186 End stage renal disease: Secondary | ICD-10-CM | POA: Diagnosis not present

## 2020-12-21 DIAGNOSIS — N2581 Secondary hyperparathyroidism of renal origin: Secondary | ICD-10-CM | POA: Diagnosis not present

## 2020-12-21 DIAGNOSIS — Z992 Dependence on renal dialysis: Secondary | ICD-10-CM | POA: Diagnosis not present

## 2020-12-21 DIAGNOSIS — D509 Iron deficiency anemia, unspecified: Secondary | ICD-10-CM | POA: Diagnosis not present

## 2020-12-21 DIAGNOSIS — D631 Anemia in chronic kidney disease: Secondary | ICD-10-CM | POA: Diagnosis not present

## 2020-12-21 DIAGNOSIS — E876 Hypokalemia: Secondary | ICD-10-CM | POA: Diagnosis not present

## 2020-12-23 DIAGNOSIS — D631 Anemia in chronic kidney disease: Secondary | ICD-10-CM | POA: Diagnosis not present

## 2020-12-23 DIAGNOSIS — N2581 Secondary hyperparathyroidism of renal origin: Secondary | ICD-10-CM | POA: Diagnosis not present

## 2020-12-23 DIAGNOSIS — D509 Iron deficiency anemia, unspecified: Secondary | ICD-10-CM | POA: Diagnosis not present

## 2020-12-23 DIAGNOSIS — E876 Hypokalemia: Secondary | ICD-10-CM | POA: Diagnosis not present

## 2020-12-23 DIAGNOSIS — N186 End stage renal disease: Secondary | ICD-10-CM | POA: Diagnosis not present

## 2020-12-23 DIAGNOSIS — Z992 Dependence on renal dialysis: Secondary | ICD-10-CM | POA: Diagnosis not present

## 2020-12-25 ENCOUNTER — Ambulatory Visit: Payer: Medicare Other | Admitting: Legal Medicine

## 2020-12-25 ENCOUNTER — Other Ambulatory Visit: Payer: Self-pay

## 2020-12-25 ENCOUNTER — Ambulatory Visit
Admission: RE | Admit: 2020-12-25 | Discharge: 2020-12-25 | Disposition: A | Discharge status: Home / Self Care | Payer: Medicare Other | Source: Ambulatory Visit | Attending: Radiation Oncology | Admitting: Radiation Oncology

## 2020-12-25 ENCOUNTER — Telehealth: Payer: Self-pay

## 2020-12-25 DIAGNOSIS — C3432 Malignant neoplasm of lower lobe, left bronchus or lung: Secondary | ICD-10-CM | POA: Insufficient documentation

## 2020-12-25 DIAGNOSIS — Z510 Encounter for antineoplastic radiation therapy: Secondary | ICD-10-CM | POA: Insufficient documentation

## 2020-12-25 NOTE — Telephone Encounter (Signed)
Spoke with patient in regards to 1 month follow-up post treatment call. Patient verbalized understanding of information given during the call. TM

## 2020-12-25 NOTE — Progress Notes (Signed)
One month post treatment telephone call. Patient denies having any issues with esophagitis or sore throat. Patient denies having pain. Patient states still having fatigue. Patient states no skin issues. Advised patient that skin is sensitive 1 year after radiation treatment and to use SPF of 30 or higher because it has he potential to burn. Advised patient to where protective clothing if skin being exposed to the sun. Patient states that he follows-up with the physicians here at the cancer center.

## 2020-12-25 NOTE — Anesthesia Postprocedure Evaluation (Signed)
Anesthesia Post Note  Patient: Victor Schultz  Procedure(s) Performed: LEFT ARTERIOVENOUS (AV) FISTULA CREATION (Left Arm Upper)     Patient location during evaluation: PACU Anesthesia Type: MAC Level of consciousness: awake and alert Pain management: pain level controlled Vital Signs Assessment: post-procedure vital signs reviewed and stable Respiratory status: spontaneous breathing, nonlabored ventilation, respiratory function stable and patient connected to nasal cannula oxygen Cardiovascular status: stable and blood pressure returned to baseline Postop Assessment: no apparent nausea or vomiting Anesthetic complications: no   No complications documented.  Last Vitals:  Vitals:   12/20/20 1225 12/20/20 1235  BP: 110/60 100/61  Pulse: 92 94  Resp: 20 (!) 21  Temp:  36.7 C  SpO2: 93% 91%    Last Pain:  Vitals:   12/20/20 1235  TempSrc:   PainSc: 0-No pain                 Sugar Vanzandt

## 2020-12-26 DIAGNOSIS — D509 Iron deficiency anemia, unspecified: Secondary | ICD-10-CM | POA: Diagnosis not present

## 2020-12-26 DIAGNOSIS — N2581 Secondary hyperparathyroidism of renal origin: Secondary | ICD-10-CM | POA: Diagnosis not present

## 2020-12-26 DIAGNOSIS — Z992 Dependence on renal dialysis: Secondary | ICD-10-CM | POA: Diagnosis not present

## 2020-12-26 DIAGNOSIS — N186 End stage renal disease: Secondary | ICD-10-CM | POA: Diagnosis not present

## 2020-12-26 DIAGNOSIS — E876 Hypokalemia: Secondary | ICD-10-CM | POA: Diagnosis not present

## 2020-12-26 DIAGNOSIS — D631 Anemia in chronic kidney disease: Secondary | ICD-10-CM | POA: Diagnosis not present

## 2020-12-27 ENCOUNTER — Encounter: Payer: Self-pay | Admitting: Legal Medicine

## 2020-12-27 ENCOUNTER — Other Ambulatory Visit: Payer: Self-pay

## 2020-12-27 ENCOUNTER — Other Ambulatory Visit: Payer: Self-pay | Admitting: Legal Medicine

## 2020-12-27 ENCOUNTER — Ambulatory Visit (INDEPENDENT_AMBULATORY_CARE_PROVIDER_SITE_OTHER): Payer: Medicare Other | Admitting: Legal Medicine

## 2020-12-27 VITALS — BP 130/58 | HR 106 | Temp 97.5°F | Resp 18 | Ht 64.0 in | Wt 145.0 lb

## 2020-12-27 DIAGNOSIS — N184 Chronic kidney disease, stage 4 (severe): Secondary | ICD-10-CM

## 2020-12-27 DIAGNOSIS — I1310 Hypertensive heart and chronic kidney disease without heart failure, with stage 1 through stage 4 chronic kidney disease, or unspecified chronic kidney disease: Secondary | ICD-10-CM

## 2020-12-27 DIAGNOSIS — N186 End stage renal disease: Secondary | ICD-10-CM | POA: Diagnosis not present

## 2020-12-27 DIAGNOSIS — E440 Moderate protein-calorie malnutrition: Secondary | ICD-10-CM

## 2020-12-27 DIAGNOSIS — J984 Other disorders of lung: Secondary | ICD-10-CM | POA: Diagnosis not present

## 2020-12-27 DIAGNOSIS — E559 Vitamin D deficiency, unspecified: Secondary | ICD-10-CM | POA: Diagnosis not present

## 2020-12-27 DIAGNOSIS — G4089 Other seizures: Secondary | ICD-10-CM

## 2020-12-27 DIAGNOSIS — K219 Gastro-esophageal reflux disease without esophagitis: Secondary | ICD-10-CM | POA: Diagnosis not present

## 2020-12-27 DIAGNOSIS — E1122 Type 2 diabetes mellitus with diabetic chronic kidney disease: Secondary | ICD-10-CM

## 2020-12-27 DIAGNOSIS — E782 Mixed hyperlipidemia: Secondary | ICD-10-CM

## 2020-12-27 DIAGNOSIS — N400 Enlarged prostate without lower urinary tract symptoms: Secondary | ICD-10-CM

## 2020-12-27 DIAGNOSIS — D582 Other hemoglobinopathies: Secondary | ICD-10-CM

## 2020-12-27 DIAGNOSIS — K739 Chronic hepatitis, unspecified: Secondary | ICD-10-CM

## 2020-12-27 DIAGNOSIS — C3432 Malignant neoplasm of lower lobe, left bronchus or lung: Secondary | ICD-10-CM | POA: Diagnosis not present

## 2020-12-27 DIAGNOSIS — E875 Hyperkalemia: Secondary | ICD-10-CM | POA: Diagnosis not present

## 2020-12-27 DIAGNOSIS — N2581 Secondary hyperparathyroidism of renal origin: Secondary | ICD-10-CM

## 2020-12-27 DIAGNOSIS — E039 Hypothyroidism, unspecified: Secondary | ICD-10-CM

## 2020-12-27 NOTE — Progress Notes (Signed)
Subjective:  Patient ID: Victor Schultz, male    DOB: 1950-11-06  Age: 70 y.o. MRN: [id number]  Chief Complaint  Patient presents with  . Hyperlipidemia  . Hypertension  . Gastroesophageal Reflux    HPI: chronic visit- paliative care  Patient has malignant carcinoma on LLL and is undergoing radiation therapy, no chemotherapy.  No surgery  He has end stage renal disease and is on dialysis 3 times a week.  He stopped all his medicines and does not want to take them.  Patient presents for follow up of hypertension.  Patient tolerating none well with side effects.  Patient was diagnosed with hypertension 2010 so has been treated for hypertension for 10 years.Patient is working on maintaining diet and exercise regimen and follows up as directed. Complication include renal failure  Patient has restrictive lung disease seeing pulmonary but not on any medicines.  Hypothyroid aff thyroid medicines.  Patient present with type 2 diabetes.  Specifically, this is type 2, noninsuin requiring diabetes, complicated by renal disease.  Compliance with treatment has been good; patient take medicines as directed, maintains diet and exercise regimen, follows up as directed, and is keeping glucose diary.  Date of  diagnosis 2010.  Depression screen has been performed.Tobacco screen nonsmoker. Current medicines for diabetes none.  Patient is on no medicines for renal protection and no medicines for cholesterol control.  Patient performs foot exams daily and last ophthalmologic exam was heart disease. Current Outpatient Medications on File Prior to Visit  Medication Sig Dispense Refill  . albuterol (VENTOLIN HFA) 108 (90 Base) MCG/ACT inhaler Inhale 1-2 puffs into the lungs every 4 (four) hours as needed for wheezing or shortness of breath. (Patient not taking: No sig reported)    . azaTHIOprine (IMURAN) 50 MG tablet Take 1 tablet (50 mg total) by mouth daily. (Patient not taking: No sig reported) 270  tablet 0  . calcitRIOL (ROCALTROL) 0.25 MCG capsule Take 0.25 mcg by mouth daily. (Patient not taking: No sig reported)    . ferrous sulfate 325 (65 FE) MG tablet Take 325 mg by mouth every Sunday. (Patient not taking: Reported on 12/27/2020)    . HYDROcodone-acetaminophen (NORCO/VICODIN) 5-325 MG tablet Take 1 tablet by mouth every 6 (six) hours as needed for moderate pain. (Patient not taking: Reported on 12/27/2020) 8 tablet 0  . metoprolol tartrate (LOPRESSOR) 25 MG tablet TAKE 1/2 TABLET(12.5 MG) BY MOUTH TWICE DAILY (Patient not taking: Reported on 12/27/2020) 90 tablet 2  . pantoprazole (PROTONIX) 40 MG tablet Take 1 tablet (40 mg total) by mouth 2 (two) times daily. (Patient not taking: No sig reported) 60 tablet 2  . sevelamer (RENAGEL) 800 MG tablet Take 1 tablet (800 mg total) by mouth in the morning and at bedtime. (Patient not taking: No sig reported) 180 tablet 0  . tamsulosin (FLOMAX) 0.4 MG CAPS capsule Take 1 capsule (0.4 mg total) by mouth daily. (Patient not taking: Reported on 12/27/2020) 90 capsule 1  . tiotropium (SPIRIVA HANDIHALER) 18 MCG inhalation capsule Place into inhaler and inhale. (Patient not taking: No sig reported)    . Vitamin D, Ergocalciferol, (DRISDOL) 1.25 MG (50000 UNIT) CAPS capsule Take 50,000 Units by mouth every Sunday.  (Patient not taking: No sig reported)     No current facility-administered medications on file prior to visit.   Past Medical History:  Diagnosis Date  . Abnormal hemoglobin (Hgb) ([place]) 12/08/2019  . Actinic keratosis 04/15/2020  . Acute upper  GI bleeding 12/22/2019  . Anemia 04/15/2020  . Arthritis    hands   . Atherosclerotic heart disease of native coronary artery with unstable angina pectoris ([place])    no chest  . CAD in native artery 02/08/2016  . Cancer ([place])    skin cancer on both arms.  . Chronic hepatitis ([place]) 04/05/2020   Hepatitis-C  . Chronic kidney disease, stage 4 (severe) ([place]) 01/02/2020  . Congenital hypoplasia of kidney  01/31/2016  . Contact dermatitis and other eczema due to other chemical products 04/15/2020  . Depression     07/2020 Loss kidney after 40 years of transplant,  "I got over it."   . Disorder of mineral metabolism, unspecified 06/18/2020  . Dizziness 05/08/2020  . End stage renal disease ([place]) 04/15/2020  . Epigastric abdominal tenderness without rebound tenderness 01/02/2020  . Essential hypertension 12/22/2019  . Gastroesophageal reflux disease without esophagitis 12/06/2019   12/19/20 - " not in a long time."  . History of blood transfusion   . Hypertensive heart and chronic kidney disease without heart failure, with stage 5 chronic kidney disease, or end stage renal disease ([place])    ESR,kidney transplant, 12/19/20= Hemodialyis [place], TTHS  . Hypertensive heart and renal disease 01/02/2020  . Hypothyroidism   . Kidney transplant status, cadaveric 07/08/2017   Formatting of this note might be different from the original. In the 1970s and again in 1982  . Long term (current) use of insulin ([place])   . Metabolic acidosis 11/26/2540  . Mixed hyperlipidemia   . Obstructive uropathy 06/18/2020  . Personal history of malignant neoplasm of skin 10/09/2010   SQUAMOUS CELL CARCINOMA- R AND L FOREARM, LEFT EAR SQUAMOUS CELL CARCINOMA- R AND L FOREARM, LEFT EAR  Formatting of this note might be different from the original. SQUAMOUS CELL CARCINOMA- R AND L FOREARM, LEFT EAR  . Presence of aortocoronary bypass graft 02/08/2016   Formatting of this note might be different from the original. in Oct of 2013 Formatting of this note might be different from the original. Formatting of this note might be different from the original. in Oct of 2013 Oct of 2013  Formatting of this note might be different from the original. Oct of 2013 Formatting of this note might be different from the original. in Oct of 2013 Formatting of this n  . Pure hypercholesterolemia 06/15/2020  . S/P CABG x 3 02/08/2016   in Oct of 2013 Oct of 2013   Formatting of this note might be different from the original. Oct of 2013  . Serum potassium elevated 12/08/2019  . Short of breath on exertion 07/06/2020  . Squamous cell carcinoma of skin of ear and external auditory canal 11/16/2010   SCC on left helix treated with Mohs surgery SCC on left helix treated with Mohs surgery  Formatting of this note might be different from the original. Overview:  SCC on left helix treated with Mohs surgery  . Type 2 diabetes mellitus with chronic kidney disease, without long-term current use of insulin ([place]) 12/06/2019  . Unspecified disorder of kidney and ureter 06/15/2020  . Vitamin D deficiency 02/08/2016   In Jan 2017, his vitamin D level was 11 In Jan 2017, his vitamin D level was 24  Formatting of this note might be different from the original. In Jan 2017, his vitamin D level was 11  . Weight gain 07/06/2020  . Wheezing 07/06/2020   Past Surgical History:  Procedure Laterality Date  . AV FISTULA PLACEMENT  Left 12/20/2020   Procedure: LEFT ARTERIOVENOUS (AV) FISTULA CREATION;  Surgeon: Rosetta Posner, MD;  Location: [facility];  Service: Vascular;  Laterality: Left;  . BIOPSY  05/24/2020   Procedure: BIOPSY;  Surgeon: [name], MD;  Location: [facility];  Service: Endoscopy;;  . CATARACT EXTRACTION    . CORONARY ARTERY BYPASS GRAFT    . ESOPHAGOGASTRODUODENOSCOPY (EGD) WITH PROPOFOL Left 12/23/2019   Procedure: ESOPHAGOGASTRODUODENOSCOPY (EGD) WITH PROPOFOL;  Surgeon: [name], MD;  Location: [facility];  Service: Endoscopy;  Laterality: Left;  . ESOPHAGOGASTRODUODENOSCOPY (EGD) WITH PROPOFOL N/A 05/24/2020   Procedure: ESOPHAGOGASTRODUODENOSCOPY (EGD) WITH PROPOFOL;  Surgeon: [name], MD;  Location: [facility];  Service: Endoscopy;  Laterality: N/A;  . EXTERNAL EAR SURGERY Left 2021  . [place]  . TRANSURETHRAL INCISION OF PROSTATE N/A 05/09/2020   Procedure: TRANSURETHRAL INCISION OF THE PROSTATE (TUIP);  Surgeon:  Irine Seal, MD;  Location: [facility];  Service: Urology;  Laterality: N/A;    Family History  Problem Relation Age of Onset  . Liver disease Mother   . Alzheimer's disease Father    Social History   Socioeconomic History  . Marital status: Divorced    Spouse name: Not on file  . Number of children: 1  . Years of education: 12th grade  . Highest education level: Some college, no degree  Occupational History  . Occupation: retired  Tobacco Use  . Smoking status: Former Smoker    Packs/day: 0.50    Years: 39.00    Pack years: 19.50    Types: Cigarettes    Quit date: 07/13/2020    Years since quitting: 0.4  . Smokeless tobacco: Never Used  Vaping Use  . Vaping Use: Never used  Substance and Sexual Activity  . Alcohol use: Never  . Drug use: Never  . Sexual activity: Not Currently  Other Topics Concern  . Not on file  Social History Narrative  . Not on file   Social Determinants of Health   Financial Resource Strain: Not on file  Food Insecurity: No Food Insecurity  . Worried About Charity fundraiser in the Last Year: Never true  . Ran Out of Food in the Last Year: Never true  Transportation Needs: No Transportation Needs  . Lack of Transportation (Medical): No  . Lack of Transportation (Non-Medical): No  Physical Activity: Not on file  Stress: Not on file  Social Connections: Socially Isolated  . Frequency of Communication with Friends and Family: More than three times a week  . Frequency of Social Gatherings with Friends and Family: Once a week  . Attends Religious Services: Never  . Active Member of Clubs or Organizations: No  . Attends Archivist Meetings: Never  . Marital Status: Divorced    Review of Systems  Constitutional: Positive for unexpected weight change. Negative for activity change and appetite change.  Eyes: Negative for visual disturbance.  Respiratory: Negative for chest tightness and shortness of breath.   Cardiovascular: Negative  for palpitations and leg swelling.  Gastrointestinal: Negative for abdominal distention and abdominal pain.  Genitourinary: Negative for dysuria.  Musculoskeletal: Negative for arthralgias and back pain.  Skin: Negative.   Psychiatric/Behavioral: Negative.      Objective:  BP (!) 130/58 (BP Location: Right Arm, Patient Position: Sitting, Cuff Size: Normal)   Pulse (!) 106   Temp (!) 97.5 F (36.4 C) (Temporal)   Resp 18   Ht '5\' 4"'  (1.626 m)  Wt 145 lb (65.8 kg)   SpO2 95%   BMI 24.89 kg/m   BP/Weight 12/27/2020 12/20/2020 8/54/6270  Systolic BP 350 093 818  Diastolic BP 58 61 81  Wt. (Lbs) 145 145 144  BMI 24.89 24.89 24.72    Physical Exam Vitals reviewed.  Constitutional:      Appearance: Normal appearance.  HENT:     Head: Normocephalic.     Right Ear: Tympanic membrane normal.     Nose: Nose normal.     Mouth/Throat:     Mouth: Mucous membranes are moist.     Pharynx: Oropharynx is clear.  Eyes:     Extraocular Movements: Extraocular movements intact.     Conjunctiva/sclera: Conjunctivae normal.     Pupils: Pupils are equal, round, and reactive to light.  Cardiovascular:     Rate and Rhythm: Normal rate and regular rhythm.     Pulses: Normal pulses.     Heart sounds: Normal heart sounds. No murmur heard. No gallop.   Pulmonary:     Effort: Pulmonary effort is normal. No respiratory distress.     Breath sounds: No rales.  Abdominal:     General: Abdomen is flat. Bowel sounds are normal. There is no distension.     Palpations: Abdomen is soft.     Tenderness: There is no abdominal tenderness.  Musculoskeletal:     Cervical back: Normal range of motion.     Comments: Loss of muscle mass  Skin:    General: Skin is warm.     Capillary Refill: Capillary refill takes less than 2 seconds.     Comments: Has AV stent in left antecubital area for dialysis  Neurological:     General: No focal deficit present.     Mental Status: He is alert and oriented to  person, place, and time.     Diabetic Foot Exam - Simple   Simple Foot Form Diabetic Foot exam was performed with the following findings: Yes 12/27/2020  2:03 PM  Visual Inspection No deformities, no ulcerations, no other skin breakdown bilaterally: Yes Sensation Testing Intact to touch and monofilament testing bilaterally: Yes Pulse Check Posterior Tibialis and Dorsalis pulse intact bilaterally: Yes Comments      Lab Results  Component Value Date   WBC 4.1 10/16/2020   HGB 10.5 (L) 12/20/2020   HCT 31.0 (L) 12/20/2020   PLT 183 10/16/2020   GLUCOSE 181 (H) 12/20/2020   CHOL 167 04/05/2020   TRIG 74 10/13/2020   HDL 44 04/05/2020   LDLCALC 77 04/05/2020   ALT 16 10/16/2020   AST 28 10/16/2020   NA 138 12/20/2020   K 3.1 (L) 12/20/2020   CL 98 12/20/2020   CREATININE 3.90 (H) 12/20/2020   BUN 24 (H) 12/20/2020   CO2 20 (L) 10/16/2020   TSH 3.310 12/06/2019   INR 1.2 12/23/2019   HGBA1C 5.3 10/14/2020   MICROALBUR 150 12/06/2019      Assessment & Plan:   Diagnoses and all orders for this visit: End stage renal disease ([place]) Patient has end-stage renal disease and is on dialysis 3 times a week he has a Port-A-Cath and the AV fistula is healing well and so they will do dialysis to that the future.  Hypertensive heart and renal disease with renal failure, stage 1 through stage 4 or unspecified chronic kidney disease, without heart failure -     CBC with Differential/Platelet -     Comprehensive metabolic panel Patient has hypertensive heart disease  with renal failure he has stopped all his medicines since he is feeling better after weight loss and dialysis.  He is basically on palliative care for his lung cancer and no plans for chemotherapy or excision.  He is not on hospice yet. Restrictive lung disease Patient has chronic restrictive lung disease and is seeing pulmonary but presently using no medications.  Type 2 diabetes mellitus with chronic kidney disease,  without long-term current use of insulin, unspecified CKD stage (HCC) -     Hemoglobin A1c Patient has type 2 diabetes with chronic renal disease and was formally on insulin but stopped due to repeated hypoglycemic episodes he feels better now and we will check his A1c to see if he still needs any treatment. Mixed hyperlipidemia -     Lipid panel AN INDIVIDUAL CARE PLAN for hyperlipidemia/ cholesterol was established and reinforced today.  The patient's status was assessed using clinical findings on exam, lab and other diagnostic tests. The patient's disease status was assessed based on evidence-based guidelines and found to be fair controlled. MEDICATIONS were reviewed. SELF MANAGEMENT GOALS have been discussed and patient's success at attaining the goal of low cholesterol was assessed. RECOMMENDATION given include regular exercise 3 days a week and low cholesterol/low fat diet. CLINICAL SUMMARY including written plan to identify barriers unique to the patient due to social or economic  reasons was discussed.  Gastroesophageal reflux disease without esophagitis Patient is having no problems with his reflux at the present time and is taking no medications.  Vitamin D deficiency Patient has chronic vitamin D deficiency but presently is on no treatment per his request.  Hyperkalemia Hyperkalemia is been well treated with dialysis.  Chronic kidney disease, stage 4 (severe) (HCC) Patient has actually stage V renal disease and is presently on hemodialysis.  Malignant neoplasm of lower lobe of left lung [facility]) Patient is seeing oncology and is presently only getting radiation therapy which she has recently completed for his lung cancer he is not a candidate for surgery or chemotherapy.  His baseline on palliative care.  Moderate protein-calorie malnutrition ([place]) Supplement nutrition with protein/calorie supplement with meals to improve nutritional status.  Other seizures ([place]) Patient has not  had seizures for years and is on no medication.  Secondary hyperparathyroidism of renal origin [facility]) Patient has secondary hyperparathyroidism of renal origin his blood pressure is well controlled and he is not requiring treatment especially since he has another malignancy present.   Chronic hepatitis ([place]) Patient has history of chronic hepatitis C but was treated and has no particular cirrhotic problems at the present time.     Orders Placed This Encounter  Procedures  . CBC with Differential/Platelet  . Comprehensive metabolic panel  . Hemoglobin A1c  . Lipid panel      I spent 35 minutes dedicated to the care of this patient on the date of this encounter to include face-to-face time with the patient, as well as: review chart and oncology  Follow-up: Return in about 3 months (around 03/29/2021).  An After Visit Summary was printed and given to the patient.  Reinaldo Meeker, MD [facility] [phone]

## 2020-12-28 DIAGNOSIS — D631 Anemia in chronic kidney disease: Secondary | ICD-10-CM | POA: Diagnosis not present

## 2020-12-28 DIAGNOSIS — Z992 Dependence on renal dialysis: Secondary | ICD-10-CM | POA: Diagnosis not present

## 2020-12-28 DIAGNOSIS — N2581 Secondary hyperparathyroidism of renal origin: Secondary | ICD-10-CM | POA: Diagnosis not present

## 2020-12-28 DIAGNOSIS — D509 Iron deficiency anemia, unspecified: Secondary | ICD-10-CM | POA: Diagnosis not present

## 2020-12-28 DIAGNOSIS — E876 Hypokalemia: Secondary | ICD-10-CM | POA: Diagnosis not present

## 2020-12-28 DIAGNOSIS — N186 End stage renal disease: Secondary | ICD-10-CM | POA: Diagnosis not present

## 2020-12-28 LAB — COMPREHENSIVE METABOLIC PANEL
ALT: 30 IU/L (ref 0–44)
AST: 48 IU/L — ABNORMAL HIGH (ref 0–40)
Albumin/Globulin Ratio: 0.8 — ABNORMAL LOW (ref 1.2–2.2)
Albumin: 3.3 g/dL — ABNORMAL LOW (ref 3.8–4.8)
Alkaline Phosphatase: 139 IU/L — ABNORMAL HIGH (ref 44–121)
BUN/Creatinine Ratio: 8 — ABNORMAL LOW (ref 10–24)
BUN: 29 mg/dL — ABNORMAL HIGH (ref 8–27)
Bilirubin Total: 0.6 mg/dL (ref 0.0–1.2)
CO2: 23 mmol/L (ref 20–29)
Calcium: 8.7 mg/dL (ref 8.6–10.2)
Chloride: 98 mmol/L (ref 96–106)
Creatinine, Ser: 3.74 mg/dL — ABNORMAL HIGH (ref 0.76–1.27)
Globulin, Total: 4 g/dL (ref 1.5–4.5)
Glucose: 181 mg/dL — ABNORMAL HIGH (ref 65–99)
Potassium: 3.9 mmol/L (ref 3.5–5.2)
Sodium: 138 mmol/L (ref 134–144)
Total Protein: 7.3 g/dL (ref 6.0–8.5)
eGFR: 17 mL/min/{1.73_m2} — ABNORMAL LOW (ref >59)

## 2020-12-28 LAB — CBC WITH DIFFERENTIAL/PLATELET
Basophils Absolute: 0 10*3/uL (ref 0.0–0.2)
Basos: 0 %
EOS (ABSOLUTE): 0.1 10*3/uL (ref 0.0–0.4)
Eos: 2 %
Hematocrit: 33.1 % — ABNORMAL LOW (ref 37.5–51.0)
Hemoglobin: 11 g/dL — ABNORMAL LOW (ref 13.0–17.7)
Immature Grans (Abs): 0 10*3/uL (ref 0.0–0.1)
Immature Granulocytes: 0 %
Lymphocytes Absolute: 1.4 10*3/uL (ref 0.7–3.1)
Lymphs: 23 %
MCH: 31.8 pg (ref 26.6–33.0)
MCHC: 33.2 g/dL (ref 31.5–35.7)
MCV: 96 fL (ref 79–97)
Monocytes Absolute: 0.8 10*3/uL (ref 0.1–0.9)
Monocytes: 13 %
Neutrophils Absolute: 3.9 10*3/uL (ref 1.4–7.0)
Neutrophils: 62 %
Platelets: 229 10*3/uL (ref 150–450)
RBC: 3.46 x10E6/uL — ABNORMAL LOW (ref 4.14–5.80)
RDW: 13.4 % (ref 11.6–15.4)
WBC: 6.1 10*3/uL (ref 3.4–10.8)

## 2020-12-28 LAB — HEMOGLOBIN A1C
Est. average glucose Bld gHb Est-mCnc: 137 mg/dL
Hgb A1c MFr Bld: 6.4 % — ABNORMAL HIGH (ref 4.8–5.6)

## 2020-12-28 LAB — LIPID PANEL
Chol/HDL Ratio: 4.2 ratio (ref 0.0–5.0)
Cholesterol, Total: 122 mg/dL (ref 100–199)
HDL: 29 mg/dL — ABNORMAL LOW (ref >39)
LDL Chol Calc (NIH): 61 mg/dL (ref 0–99)
Triglycerides: 189 mg/dL — ABNORMAL HIGH (ref 0–149)
VLDL Cholesterol Cal: 32 mg/dL (ref 5–40)

## 2020-12-28 LAB — CARDIOVASCULAR RISK ASSESSMENT

## 2020-12-28 NOTE — Progress Notes (Signed)
Chronic anemia, glucose 181, kidney tests stage 4-5, liver tests up, A1c 6.4 Ok, triglycerides high 189 watch diet lp

## 2020-12-30 DIAGNOSIS — E876 Hypokalemia: Secondary | ICD-10-CM | POA: Diagnosis not present

## 2020-12-30 DIAGNOSIS — N186 End stage renal disease: Secondary | ICD-10-CM | POA: Diagnosis not present

## 2020-12-30 DIAGNOSIS — D509 Iron deficiency anemia, unspecified: Secondary | ICD-10-CM | POA: Diagnosis not present

## 2020-12-30 DIAGNOSIS — Z992 Dependence on renal dialysis: Secondary | ICD-10-CM | POA: Diagnosis not present

## 2020-12-30 DIAGNOSIS — N2581 Secondary hyperparathyroidism of renal origin: Secondary | ICD-10-CM | POA: Diagnosis not present

## 2020-12-30 DIAGNOSIS — D631 Anemia in chronic kidney disease: Secondary | ICD-10-CM | POA: Diagnosis not present

## 2021-01-02 DIAGNOSIS — E876 Hypokalemia: Secondary | ICD-10-CM | POA: Diagnosis not present

## 2021-01-02 DIAGNOSIS — D631 Anemia in chronic kidney disease: Secondary | ICD-10-CM | POA: Diagnosis not present

## 2021-01-02 DIAGNOSIS — Z992 Dependence on renal dialysis: Secondary | ICD-10-CM | POA: Diagnosis not present

## 2021-01-02 DIAGNOSIS — N186 End stage renal disease: Secondary | ICD-10-CM | POA: Diagnosis not present

## 2021-01-02 DIAGNOSIS — N2581 Secondary hyperparathyroidism of renal origin: Secondary | ICD-10-CM | POA: Diagnosis not present

## 2021-01-02 DIAGNOSIS — D509 Iron deficiency anemia, unspecified: Secondary | ICD-10-CM | POA: Diagnosis not present

## 2021-01-04 DIAGNOSIS — E876 Hypokalemia: Secondary | ICD-10-CM | POA: Diagnosis not present

## 2021-01-04 DIAGNOSIS — N2581 Secondary hyperparathyroidism of renal origin: Secondary | ICD-10-CM | POA: Diagnosis not present

## 2021-01-04 DIAGNOSIS — N186 End stage renal disease: Secondary | ICD-10-CM | POA: Diagnosis not present

## 2021-01-04 DIAGNOSIS — Z992 Dependence on renal dialysis: Secondary | ICD-10-CM | POA: Diagnosis not present

## 2021-01-04 DIAGNOSIS — D509 Iron deficiency anemia, unspecified: Secondary | ICD-10-CM | POA: Diagnosis not present

## 2021-01-04 DIAGNOSIS — D631 Anemia in chronic kidney disease: Secondary | ICD-10-CM | POA: Diagnosis not present

## 2021-01-04 DIAGNOSIS — Z4802 Encounter for removal of sutures: Secondary | ICD-10-CM | POA: Insufficient documentation

## 2021-01-05 DIAGNOSIS — N186 End stage renal disease: Secondary | ICD-10-CM | POA: Diagnosis not present

## 2021-01-05 DIAGNOSIS — T8610 Unspecified complication of kidney transplant: Secondary | ICD-10-CM | POA: Diagnosis not present

## 2021-01-05 DIAGNOSIS — Z992 Dependence on renal dialysis: Secondary | ICD-10-CM | POA: Diagnosis not present

## 2021-01-06 DIAGNOSIS — D509 Iron deficiency anemia, unspecified: Secondary | ICD-10-CM | POA: Diagnosis not present

## 2021-01-06 DIAGNOSIS — N186 End stage renal disease: Secondary | ICD-10-CM | POA: Diagnosis not present

## 2021-01-06 DIAGNOSIS — D631 Anemia in chronic kidney disease: Secondary | ICD-10-CM | POA: Diagnosis not present

## 2021-01-06 DIAGNOSIS — Z992 Dependence on renal dialysis: Secondary | ICD-10-CM | POA: Diagnosis not present

## 2021-01-06 DIAGNOSIS — N2581 Secondary hyperparathyroidism of renal origin: Secondary | ICD-10-CM | POA: Diagnosis not present

## 2021-01-06 DIAGNOSIS — Z23 Encounter for immunization: Secondary | ICD-10-CM | POA: Diagnosis not present

## 2021-01-06 DIAGNOSIS — E876 Hypokalemia: Secondary | ICD-10-CM | POA: Diagnosis not present

## 2021-01-09 DIAGNOSIS — D509 Iron deficiency anemia, unspecified: Secondary | ICD-10-CM | POA: Diagnosis not present

## 2021-01-09 DIAGNOSIS — N2581 Secondary hyperparathyroidism of renal origin: Secondary | ICD-10-CM | POA: Diagnosis not present

## 2021-01-09 DIAGNOSIS — D631 Anemia in chronic kidney disease: Secondary | ICD-10-CM | POA: Diagnosis not present

## 2021-01-09 DIAGNOSIS — N186 End stage renal disease: Secondary | ICD-10-CM | POA: Diagnosis not present

## 2021-01-09 DIAGNOSIS — Z992 Dependence on renal dialysis: Secondary | ICD-10-CM | POA: Diagnosis not present

## 2021-01-09 DIAGNOSIS — E876 Hypokalemia: Secondary | ICD-10-CM | POA: Diagnosis not present

## 2021-01-10 ENCOUNTER — Telehealth: Payer: Self-pay | Admitting: Hematology and Oncology

## 2021-01-10 DIAGNOSIS — E118 Type 2 diabetes mellitus with unspecified complications: Secondary | ICD-10-CM | POA: Diagnosis not present

## 2021-01-10 DIAGNOSIS — N185 Chronic kidney disease, stage 5: Secondary | ICD-10-CM | POA: Diagnosis not present

## 2021-01-10 DIAGNOSIS — E785 Hyperlipidemia, unspecified: Secondary | ICD-10-CM | POA: Diagnosis not present

## 2021-01-10 DIAGNOSIS — E039 Hypothyroidism, unspecified: Secondary | ICD-10-CM | POA: Diagnosis not present

## 2021-01-10 DIAGNOSIS — E559 Vitamin D deficiency, unspecified: Secondary | ICD-10-CM | POA: Diagnosis not present

## 2021-01-10 DIAGNOSIS — Z940 Kidney transplant status: Secondary | ICD-10-CM | POA: Diagnosis not present

## 2021-01-10 NOTE — Telephone Encounter (Signed)
Per Staff Msg, patient scheduled for 4/11 labs, Follow up with [facility] - Patient Notfied

## 2021-01-11 DIAGNOSIS — N186 End stage renal disease: Secondary | ICD-10-CM | POA: Diagnosis not present

## 2021-01-11 DIAGNOSIS — N2581 Secondary hyperparathyroidism of renal origin: Secondary | ICD-10-CM | POA: Diagnosis not present

## 2021-01-11 DIAGNOSIS — D631 Anemia in chronic kidney disease: Secondary | ICD-10-CM | POA: Diagnosis not present

## 2021-01-11 DIAGNOSIS — D509 Iron deficiency anemia, unspecified: Secondary | ICD-10-CM | POA: Diagnosis not present

## 2021-01-11 DIAGNOSIS — E876 Hypokalemia: Secondary | ICD-10-CM | POA: Diagnosis not present

## 2021-01-11 DIAGNOSIS — Z992 Dependence on renal dialysis: Secondary | ICD-10-CM | POA: Diagnosis not present

## 2021-01-13 ENCOUNTER — Other Ambulatory Visit: Payer: Self-pay

## 2021-01-13 DIAGNOSIS — D631 Anemia in chronic kidney disease: Secondary | ICD-10-CM | POA: Diagnosis not present

## 2021-01-13 DIAGNOSIS — N2581 Secondary hyperparathyroidism of renal origin: Secondary | ICD-10-CM | POA: Diagnosis not present

## 2021-01-13 DIAGNOSIS — E876 Hypokalemia: Secondary | ICD-10-CM | POA: Diagnosis not present

## 2021-01-13 DIAGNOSIS — N186 End stage renal disease: Secondary | ICD-10-CM

## 2021-01-13 DIAGNOSIS — Z992 Dependence on renal dialysis: Secondary | ICD-10-CM | POA: Diagnosis not present

## 2021-01-13 DIAGNOSIS — D509 Iron deficiency anemia, unspecified: Secondary | ICD-10-CM | POA: Diagnosis not present

## 2021-01-15 ENCOUNTER — Other Ambulatory Visit: Payer: Self-pay

## 2021-01-15 ENCOUNTER — Telehealth: Payer: Self-pay | Admitting: Hematology and Oncology

## 2021-01-15 ENCOUNTER — Inpatient Hospital Stay: Payer: Medicare Other | Attending: Hematology and Oncology

## 2021-01-15 ENCOUNTER — Inpatient Hospital Stay (INDEPENDENT_AMBULATORY_CARE_PROVIDER_SITE_OTHER): Payer: Medicare Other | Admitting: Hematology and Oncology

## 2021-01-15 ENCOUNTER — Other Ambulatory Visit: Payer: Self-pay | Admitting: Hematology and Oncology

## 2021-01-15 VITALS — BP 177/79 | HR 108 | Temp 97.9°F | Resp 20 | Ht 64.0 in | Wt 145.2 lb

## 2021-01-15 DIAGNOSIS — C3432 Malignant neoplasm of lower lobe, left bronchus or lung: Secondary | ICD-10-CM

## 2021-01-15 DIAGNOSIS — D649 Anemia, unspecified: Secondary | ICD-10-CM | POA: Diagnosis not present

## 2021-01-15 LAB — HEPATIC FUNCTION PANEL
ALT: 48 — AB (ref 10–40)
AST: 24 (ref 14–40)
Alkaline Phosphatase: 150 — AB (ref 25–125)
Bilirubin, Total: 0.7

## 2021-01-15 LAB — BASIC METABOLIC PANEL
BUN: 30 — AB (ref 4–21)
CO2: 22 (ref 13–22)
Chloride: 105 (ref 99–108)
Creatinine: 5.1 — AB (ref 0.6–1.3)
Glucose: 138
Potassium: 4.1 (ref 3.4–5.3)
Sodium: 138 (ref 137–147)

## 2021-01-15 LAB — CBC AND DIFFERENTIAL
HCT: 34 — AB (ref 41–53)
Hemoglobin: 11.3 — AB (ref 13.5–17.5)
Neutrophils Absolute: 2.97
Platelets: 156 (ref 150–399)
WBC: 5.4

## 2021-01-15 LAB — COMPREHENSIVE METABOLIC PANEL
Albumin: 3.7 (ref 3.5–5.0)
Calcium: 9.1 (ref 8.7–10.7)

## 2021-01-15 LAB — PROTEIN, TOTAL: Total Protein: 8.3 g/dL — AB (ref 6.3–8.2)

## 2021-01-15 LAB — CBC: RBC: 3.49 — AB (ref 3.87–5.11)

## 2021-01-15 NOTE — Progress Notes (Signed)
[facility]  [address] [place],  [place]  [address] [phone]  Clinic Day:  01/16/2021  Referring physician: Lillard Anes,*      CHIEF COMPLAINT:  CC: A 70 year old male with stage I lung malignancy here for evaluation after completing SBRT.  Current Treatment:  Surveillance   HISTORY OF PRESENT ILLNESS:  Victor Schultz is a 70 y.o. male referred by Dr. Gardiner Rhyme for the evaluation and treatment of a possible lung malignancy.  This was found when the patient had a fall in his home and presented to the [facility] on November 7th due to right shoulder and right chest wall pain.  Imaging revealed right 8, 9 and 10 rib fractures.  However, CT scan showed incidental finding of a 2.0 x 1.4 cm macrolobulated nodule in the anterior aspect of the left lower lobe abutting the left hemidiaphragm.  PET imaging was pursued on December 3rd confirming the lobulated left lower lobe pulmonary nodule measuring 1.8 x 1.7 cm to be hypermetabolic with an SUV max of 4.4, suspicious for possible malignancy.  There was also intense hypermetabolic activity associated with the superior aspect of the left ear associated with soft tissue thickening on the CT, which is suspicious for neoplasm.  No other evidence of malignancy was observed.    Usually we would pursue pathologic confirmation, but with the PET results, we know this is likely malignancy.  Surgical resection, could be considered, but this would be a high risk and invasive procedure.  As he is on dialysis and this appears to be a stage I cancer, we do not recommend chemotherapy, but he may be a candidate for stereotactic radiation. He did undergo  SBRT and completed 11-28-2020. He continues to follow with Radiation Oncology  INTERVAL HISTORY:  Victor Schultz is a pleasant 71 year old male with a PET diagnosed stage I lung malignancy who underwent SBRT. He has been well since completing radiation  therapy and states he notes that his cough is gone.He has had several skin cancers removed in the past and had a recent one removed from his right hand.  He continues with hemodialysis and had a new port placed recently; he is scheduled for tomorrow. He denies fever, chills, nausea or vomiting. He denies shortness of breath, chest pain or cough. He denies issue with bowel or bladder. CBC and CMP are unremarkable today other than a creatinine 5.1.  REVIEW OF SYSTEMS:  Review of Systems  Constitutional: Negative for appetite change, chills, diaphoresis, fatigue, fever and unexpected weight change.  HENT:   Negative for hearing loss, lump/mass, mouth sores, nosebleeds, sore throat, tinnitus, trouble swallowing and voice change.   Eyes: Negative for eye problems and icterus.  Respiratory: Negative for chest tightness, cough, hemoptysis, shortness of breath and wheezing.   Cardiovascular: Negative for chest pain, leg swelling and palpitations.  Gastrointestinal: Negative for abdominal distention, abdominal pain, blood in stool, constipation, diarrhea, nausea, rectal pain and vomiting.  Endocrine: Negative for hot flashes.  Genitourinary: Negative for bladder incontinence, difficulty urinating, dyspareunia, dysuria, frequency, hematuria and nocturia.   Musculoskeletal: Negative for arthralgias, back pain, flank pain, gait problem, myalgias, neck pain and neck stiffness.  Skin: Negative for itching, rash and wound.  Neurological: Negative for dizziness, extremity weakness, gait problem, headaches, light-headedness, numbness, seizures and speech difficulty.  Hematological: Negative for adenopathy. Does not bruise/bleed easily.  Psychiatric/Behavioral: Negative for confusion, decreased concentration, depression, sleep disturbance and suicidal ideas. The patient is  not nervous/anxious.      VITALS:  Blood pressure (!) 177/79, pulse (!) 108, temperature 97.9 F (36.6 C), temperature source Oral, resp. rate  20, height '5\' 4"'  (1.626 m), weight 145 lb 3.2 oz (65.9 kg), SpO2 96 %.  Wt Readings from Last 3 Encounters:  01/15/21 145 lb 3.2 oz (65.9 kg)  12/27/20 145 lb (65.8 kg)  12/20/20 145 lb (65.8 kg)    Body mass index is 24.92 kg/m.  Performance status (ECOG): 1 - Symptomatic but completely ambulatory  PHYSICAL EXAM:  Physical Exam Constitutional:      General: He is not in acute distress.    Appearance: Normal appearance. He is normal weight. He is not ill-appearing, toxic-appearing or diaphoretic.  HENT:     Head: Normocephalic and atraumatic.     Nose: Nose normal. No congestion or rhinorrhea.     Mouth/Throat:     Mouth: Mucous membranes are moist.     Pharynx: Oropharynx is clear. No oropharyngeal exudate or posterior oropharyngeal erythema.  Eyes:     General: No scleral icterus.       Right eye: No discharge.        Left eye: No discharge.     Extraocular Movements: Extraocular movements intact.     Conjunctiva/sclera: Conjunctivae normal.     Pupils: Pupils are equal, round, and reactive to light.  Neck:     Vascular: No carotid bruit.  Cardiovascular:     Rate and Rhythm: Normal rate and regular rhythm.     Heart sounds: No murmur heard. No friction rub. No gallop.   Pulmonary:     Effort: Pulmonary effort is normal. No respiratory distress.     Breath sounds: Normal breath sounds. No stridor. No wheezing, rhonchi or rales.  Chest:     Chest wall: No tenderness.  Abdominal:     General: Abdomen is flat. Bowel sounds are normal. There is no distension.     Palpations: There is no mass.     Tenderness: There is no abdominal tenderness. There is no right CVA tenderness, left CVA tenderness, guarding or rebound.     Hernia: No hernia is present.  Musculoskeletal:        General: No swelling, tenderness, deformity or signs of injury. Normal range of motion.     Cervical back: Normal range of motion and neck supple. No rigidity or tenderness.     Right lower leg: No  edema.     Left lower leg: No edema.  Lymphadenopathy:     Cervical: No cervical adenopathy.  Skin:    General: Skin is warm and dry.     Capillary Refill: Capillary refill takes less than 2 seconds.     Coloration: Skin is not jaundiced or pale.     Findings: No bruising, erythema, lesion or rash.  Neurological:     General: No focal deficit present.     Mental Status: He is alert and oriented to person, place, and time. Mental status is at baseline.     Cranial Nerves: No cranial nerve deficit.     Sensory: No sensory deficit.     Motor: No weakness.     Coordination: Coordination normal.     Gait: Gait normal.     Deep Tendon Reflexes: Reflexes normal.  Psychiatric:        Mood and Affect: Mood normal.        Behavior: Behavior normal.        Thought Content: Thought  content normal.        Judgment: Judgment normal.    LABS:   CBC Latest Ref Rng & Units 01/15/2021 12/27/2020 12/20/2020  WBC - 5.4 6.1 -  Hemoglobin 13.5 - 17.5 11.3(A) 11.0(L) 10.5(L)  Hematocrit 41 - 53 34(A) 33.1(L) 31.0(L)  Platelets 150 - 399 156 229 -   CMP Latest Ref Rng & Units 01/15/2021 12/27/2020 12/20/2020  Glucose 65 - 99 mg/dL - 181(H) 181(H)  BUN 4 - 21 30(A) 29(H) 24(H)  Creatinine 0.6 - 1.3 5.1(A) 3.74(H) 3.90(H)  Sodium 137 - 147 138 138 138  Potassium 3.4 - 5.3 4.1 3.9 3.1(L)  Chloride 99 - 108 105 98 98  CO2 13 - '22 22 23 ' -  Calcium 8.7 - 10.7 9.1 8.7 -  Total Protein 6.3 - 8.2 g/dL 8.3(A) 7.3 -  Total Bilirubin 0.0 - 1.2 mg/dL - 0.6 -  Alkaline Phos 25 - 125 150(A) 139(H) -  AST 14 - 40 24 48(H) -  ALT 10 - 40 48(A) 30 -     Lab Results  Component Value Date   CEA1 4.6 09/22/2020   /  CEA  Date Value Ref Range Status  09/22/2020 4.6 0.0 - 4.7 ng/mL Final    Comment:    (NOTE)                             Nonsmokers          <3.9                             Smokers             <5.6 [employer] Electrochemiluminescence Immunoassay (ECLIA) Values obtained with different  assay methods or kits cannot be used interchangeably.  Results cannot be interpreted as absolute evidence of the presence or absence of malignant disease. Performed At: [facility] [place], [place] [id number] Rush Farmer MD KG:[id number]      STUDIES:  No results found.     HISTORY:   Past Medical History:  Diagnosis Date  . Abnormal hemoglobin (Hgb) ([place]) 12/08/2019  . Actinic keratosis 04/15/2020  . Acute upper GI bleeding 12/22/2019  . Anemia 04/15/2020  . Arthritis    hands   . Atherosclerotic heart disease of native coronary artery with unstable angina pectoris ([place])    no chest  . CAD in native artery 02/08/2016  . Cancer ([place])    skin cancer on both arms.  . Chronic hepatitis ([place]) 04/05/2020   Hepatitis-C  . Chronic kidney disease, stage 4 (severe) ([place]) 01/02/2020  . 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 Contact dermatitis and other eczema due to other chemical products 04/15/2020  . Depression     07/2020 Loss kidney after 40 years of transplant,  "I got over it."   . Disorder of mineral metabolism, unspecified 06/18/2020  . Dizziness 05/08/2020  . End stage renal disease ([place]) 04/15/2020  . Epigastric abdominal tenderness without rebound tenderness 01/02/2020  . Essential hypertension 12/22/2019  . Gastroesophageal reflux disease without esophagitis 12/06/2019   12/19/20 - " not in a long time."  . History of blood transfusion   . Hypertensive heart and chronic kidney disease without heart failure, with stage 5 chronic kidney disease, or end stage renal disease ([place])    ESR,kidney transplant, 12/19/20= Hemodialyis [place], TTHS  . Hypertensive heart and renal disease  01/02/2020  . Hypothyroidism   . Kidney transplant status, cadaveric 07/08/2017   Formatting of this note might be different from the original. In the 1970s and again in 1982  . Long term (current) use of insulin ([place])   . Metabolic acidosis 06/23/9149  . Mixed hyperlipidemia    . Obstructive uropathy 06/18/2020  . Personal history of malignant neoplasm of skin 10/09/2010   SQUAMOUS CELL CARCINOMA- R AND L FOREARM, LEFT EAR SQUAMOUS CELL CARCINOMA- R AND L FOREARM, LEFT EAR  Formatting of this note might be different from the original. SQUAMOUS CELL CARCINOMA- R AND L FOREARM, LEFT EAR  . Presence of aortocoronary bypass graft 02/08/2016   Formatting of this note might be different from the original. in Oct of 2013 Formatting of this note might be different from the original. Formatting of this note might be different from the original. in Oct of 2013 Oct of 2013  Formatting of this note might be different from the original. Oct of 2013 Formatting of this note might be different from the original. in Oct of 2013 Formatting of this n  . Pure hypercholesterolemia 06/15/2020  . S/P CABG x 3 02/08/2016   in Oct of 2013 Oct of 2013  Formatting of this note might be different from the original. Oct of 2013  . Serum potassium elevated 12/08/2019  . Short of breath on exertion 07/06/2020  . Squamous cell carcinoma of skin of ear and external auditory canal 11/16/2010   SCC on left helix treated with Mohs surgery SCC on left helix treated with Mohs surgery  Formatting of this note might be different from the original. Overview:  SCC on left helix treated with Mohs surgery  . Type 2 diabetes mellitus with chronic kidney disease, without long-term current use of insulin ([place]) 12/06/2019  . Unspecified disorder of kidney and ureter 06/15/2020  . Vitamin D deficiency 02/08/2016   In Jan 2017, his vitamin D level was 11 In Jan 2017, his vitamin D level was 48  Formatting of this note might be different from the original. In Jan 2017, his vitamin D level was 11  . Weight gain 07/06/2020  . Wheezing 07/06/2020    Past Surgical History:  Procedure Laterality Date  . AV FISTULA PLACEMENT Left 12/20/2020   Procedure: LEFT ARTERIOVENOUS (AV) FISTULA CREATION;  Surgeon: Rosetta Posner, MD;  Location: [facility];   Service: Vascular;  Laterality: Left;  . BIOPSY  05/24/2020   Procedure: BIOPSY;  Surgeon: [name], MD;  Location: [facility];  Service: Endoscopy;;  . CATARACT EXTRACTION    . CORONARY ARTERY BYPASS GRAFT    . ESOPHAGOGASTRODUODENOSCOPY (EGD) WITH PROPOFOL Left 12/23/2019   Procedure: ESOPHAGOGASTRODUODENOSCOPY (EGD) WITH PROPOFOL;  Surgeon: [name], MD;  Location: [facility];  Service: Endoscopy;  Laterality: Left;  . ESOPHAGOGASTRODUODENOSCOPY (EGD) WITH PROPOFOL N/A 05/24/2020   Procedure: ESOPHAGOGASTRODUODENOSCOPY (EGD) WITH PROPOFOL;  Surgeon: [name], MD;  Location: [facility];  Service: Endoscopy;  Laterality: N/A;  . EXTERNAL EAR SURGERY Left 2021  . [place]  . TRANSURETHRAL INCISION OF PROSTATE N/A 05/09/2020   Procedure: TRANSURETHRAL INCISION OF THE PROSTATE (TUIP);  Surgeon: Irine Seal, MD;  Location: [facility];  Service: Urology;  Laterality: N/A;    Family History  Problem Relation Age of Onset  . Liver disease Mother   . Alzheimer's disease Father     Social History:  reports that he quit smoking about 6 months ago. His smoking  use included cigarettes. He has a 19.50 pack-year smoking history. He has never used smokeless tobacco. He reports that he does not drink alcohol and does not use drugs.The patient is accompanied by family today.  He is single and lives at home alone.  He has 1 child, but they are not actively present.  He is retired from [employer].  Allergies:  Allergies  Allergen Reactions  . Latex Other (See Comments)    Unknown   . Methoxy Polyethylene Glycol-Epoetin Beta Other (See Comments)    Unknown    Current Medications: Current Outpatient Medications  Medication Sig Dispense Refill  . levothyroxine (SYNTHROID) 50 MCG tablet Take by mouth.    [name] albuterol (VENTOLIN HFA) 108 (90 Base) MCG/ACT inhaler Inhale 1-2 puffs into the lungs every 4 (four) hours as needed for wheezing or shortness of breath.  (Patient not taking: No sig reported)    . atorvastatin (LIPITOR) 20 MG tablet TAKE 1 TABLET(20 MG) BY MOUTH DAILY 90 tablet 2  . B Complex-C-Folic Acid (DIALYVITE TABLET) TABS Take 1 tablet by mouth daily.    . ferrous sulfate 325 (65 FE) MG tablet Take 325 mg by mouth every Sunday. (Patient not taking: Reported on 12/27/2020)    . HYDROcodone-acetaminophen (NORCO/VICODIN) 5-325 MG tablet Take 1 tablet by mouth every 6 (six) hours as needed for moderate pain. (Patient not taking: Reported on 12/27/2020) 8 tablet 0  . pantoprazole (PROTONIX) 40 MG tablet Take 1 tablet (40 mg total) by mouth 2 (two) times daily. (Patient not taking: No sig reported) 60 tablet 2  . tiotropium (SPIRIVA HANDIHALER) 18 MCG inhalation capsule Place into inhaler and inhale. (Patient not taking: No sig reported)     No current facility-administered medications for this visit.     ASSESSMENT & PLAN:   Assessment:   1.  Lobulated left lower lobe pulmonary nodule measuring 1.8 x 1.7 cm.  This is concerning for malignancy and has been confirmed hypermerabolic on recent PET imaging. He underwent SBRT, completed in February.   2. Skin cancer. He has had multiple lesions removed and most recently had one removed from the right hand. Dressing is dry and intact. He will follow with Dermatology.  3.  End stage renal disease and history of kidney transplants, in the 70s and 80s.  He is currently on hemodialysis. Creatinine 5.1 today.    Plan: He will follow with Radiation Oncology next month for imaging. He will continue with hemodialysis. He will continue follow up with Dermatology. We will see him back in clinic in 4 months with repeat CBC, CMP and evaluation.   He verbalizes understanding of and agreement to the plans discussed today. He knows to call the office should any new questions or concerns arise.   Melodye Ped, NP [facility] AT [facility] [place] [address] Dept: [phone] Dept Fax: [phone]

## 2021-01-15 NOTE — Telephone Encounter (Signed)
Per 4/11 LOS, patient scheduled for Aug Appt's.  Gave patient Appt Summary

## 2021-01-16 ENCOUNTER — Encounter: Payer: Self-pay | Admitting: Hematology and Oncology

## 2021-01-16 DIAGNOSIS — D631 Anemia in chronic kidney disease: Secondary | ICD-10-CM | POA: Diagnosis not present

## 2021-01-16 DIAGNOSIS — Z992 Dependence on renal dialysis: Secondary | ICD-10-CM | POA: Diagnosis not present

## 2021-01-16 DIAGNOSIS — D509 Iron deficiency anemia, unspecified: Secondary | ICD-10-CM | POA: Diagnosis not present

## 2021-01-16 DIAGNOSIS — N186 End stage renal disease: Secondary | ICD-10-CM | POA: Diagnosis not present

## 2021-01-16 DIAGNOSIS — N2581 Secondary hyperparathyroidism of renal origin: Secondary | ICD-10-CM | POA: Diagnosis not present

## 2021-01-16 DIAGNOSIS — E876 Hypokalemia: Secondary | ICD-10-CM | POA: Diagnosis not present

## 2021-01-18 DIAGNOSIS — D631 Anemia in chronic kidney disease: Secondary | ICD-10-CM | POA: Diagnosis not present

## 2021-01-18 DIAGNOSIS — E1122 Type 2 diabetes mellitus with diabetic chronic kidney disease: Secondary | ICD-10-CM | POA: Diagnosis not present

## 2021-01-18 DIAGNOSIS — E876 Hypokalemia: Secondary | ICD-10-CM | POA: Diagnosis not present

## 2021-01-18 DIAGNOSIS — Z992 Dependence on renal dialysis: Secondary | ICD-10-CM | POA: Diagnosis not present

## 2021-01-18 DIAGNOSIS — N186 End stage renal disease: Secondary | ICD-10-CM | POA: Diagnosis not present

## 2021-01-18 DIAGNOSIS — N2581 Secondary hyperparathyroidism of renal origin: Secondary | ICD-10-CM | POA: Diagnosis not present

## 2021-01-18 DIAGNOSIS — D509 Iron deficiency anemia, unspecified: Secondary | ICD-10-CM | POA: Diagnosis not present

## 2021-01-20 DIAGNOSIS — D509 Iron deficiency anemia, unspecified: Secondary | ICD-10-CM | POA: Diagnosis not present

## 2021-01-20 DIAGNOSIS — N2581 Secondary hyperparathyroidism of renal origin: Secondary | ICD-10-CM | POA: Diagnosis not present

## 2021-01-20 DIAGNOSIS — N186 End stage renal disease: Secondary | ICD-10-CM | POA: Diagnosis not present

## 2021-01-20 DIAGNOSIS — E876 Hypokalemia: Secondary | ICD-10-CM | POA: Diagnosis not present

## 2021-01-20 DIAGNOSIS — Z992 Dependence on renal dialysis: Secondary | ICD-10-CM | POA: Diagnosis not present

## 2021-01-20 DIAGNOSIS — D631 Anemia in chronic kidney disease: Secondary | ICD-10-CM | POA: Diagnosis not present

## 2021-01-22 DIAGNOSIS — B351 Tinea unguium: Secondary | ICD-10-CM | POA: Diagnosis not present

## 2021-01-22 DIAGNOSIS — L853 Xerosis cutis: Secondary | ICD-10-CM | POA: Diagnosis not present

## 2021-01-22 DIAGNOSIS — E119 Type 2 diabetes mellitus without complications: Secondary | ICD-10-CM | POA: Diagnosis not present

## 2021-01-23 DIAGNOSIS — E876 Hypokalemia: Secondary | ICD-10-CM | POA: Diagnosis not present

## 2021-01-23 DIAGNOSIS — N2581 Secondary hyperparathyroidism of renal origin: Secondary | ICD-10-CM | POA: Diagnosis not present

## 2021-01-23 DIAGNOSIS — D509 Iron deficiency anemia, unspecified: Secondary | ICD-10-CM | POA: Diagnosis not present

## 2021-01-23 DIAGNOSIS — D631 Anemia in chronic kidney disease: Secondary | ICD-10-CM | POA: Diagnosis not present

## 2021-01-23 DIAGNOSIS — N186 End stage renal disease: Secondary | ICD-10-CM | POA: Diagnosis not present

## 2021-01-23 DIAGNOSIS — Z992 Dependence on renal dialysis: Secondary | ICD-10-CM | POA: Diagnosis not present

## 2021-01-24 DIAGNOSIS — L814 Other melanin hyperpigmentation: Secondary | ICD-10-CM | POA: Diagnosis not present

## 2021-01-24 DIAGNOSIS — C44622 Squamous cell carcinoma of skin of right upper limb, including shoulder: Secondary | ICD-10-CM | POA: Diagnosis not present

## 2021-01-24 DIAGNOSIS — L578 Other skin changes due to chronic exposure to nonionizing radiation: Secondary | ICD-10-CM | POA: Diagnosis not present

## 2021-01-24 DIAGNOSIS — R911 Solitary pulmonary nodule: Secondary | ICD-10-CM | POA: Diagnosis not present

## 2021-01-24 DIAGNOSIS — J4520 Mild intermittent asthma, uncomplicated: Secondary | ICD-10-CM | POA: Diagnosis not present

## 2021-01-25 DIAGNOSIS — E876 Hypokalemia: Secondary | ICD-10-CM | POA: Diagnosis not present

## 2021-01-25 DIAGNOSIS — N2581 Secondary hyperparathyroidism of renal origin: Secondary | ICD-10-CM | POA: Diagnosis not present

## 2021-01-25 DIAGNOSIS — Z992 Dependence on renal dialysis: Secondary | ICD-10-CM | POA: Diagnosis not present

## 2021-01-25 DIAGNOSIS — N186 End stage renal disease: Secondary | ICD-10-CM | POA: Diagnosis not present

## 2021-01-25 DIAGNOSIS — D631 Anemia in chronic kidney disease: Secondary | ICD-10-CM | POA: Diagnosis not present

## 2021-01-25 DIAGNOSIS — D509 Iron deficiency anemia, unspecified: Secondary | ICD-10-CM | POA: Diagnosis not present

## 2021-01-27 DIAGNOSIS — E876 Hypokalemia: Secondary | ICD-10-CM | POA: Diagnosis not present

## 2021-01-27 DIAGNOSIS — D509 Iron deficiency anemia, unspecified: Secondary | ICD-10-CM | POA: Diagnosis not present

## 2021-01-27 DIAGNOSIS — N2581 Secondary hyperparathyroidism of renal origin: Secondary | ICD-10-CM | POA: Diagnosis not present

## 2021-01-27 DIAGNOSIS — N186 End stage renal disease: Secondary | ICD-10-CM | POA: Diagnosis not present

## 2021-01-27 DIAGNOSIS — Z992 Dependence on renal dialysis: Secondary | ICD-10-CM | POA: Diagnosis not present

## 2021-01-27 DIAGNOSIS — D631 Anemia in chronic kidney disease: Secondary | ICD-10-CM | POA: Diagnosis not present

## 2021-01-30 DIAGNOSIS — D631 Anemia in chronic kidney disease: Secondary | ICD-10-CM | POA: Diagnosis not present

## 2021-01-30 DIAGNOSIS — Z992 Dependence on renal dialysis: Secondary | ICD-10-CM | POA: Diagnosis not present

## 2021-01-30 DIAGNOSIS — E876 Hypokalemia: Secondary | ICD-10-CM | POA: Diagnosis not present

## 2021-01-30 DIAGNOSIS — N2581 Secondary hyperparathyroidism of renal origin: Secondary | ICD-10-CM | POA: Diagnosis not present

## 2021-01-30 DIAGNOSIS — N186 End stage renal disease: Secondary | ICD-10-CM | POA: Diagnosis not present

## 2021-01-30 DIAGNOSIS — D509 Iron deficiency anemia, unspecified: Secondary | ICD-10-CM | POA: Diagnosis not present

## 2021-02-01 DIAGNOSIS — Z992 Dependence on renal dialysis: Secondary | ICD-10-CM | POA: Diagnosis not present

## 2021-02-01 DIAGNOSIS — D631 Anemia in chronic kidney disease: Secondary | ICD-10-CM | POA: Diagnosis not present

## 2021-02-01 DIAGNOSIS — N186 End stage renal disease: Secondary | ICD-10-CM | POA: Diagnosis not present

## 2021-02-01 DIAGNOSIS — D509 Iron deficiency anemia, unspecified: Secondary | ICD-10-CM | POA: Diagnosis not present

## 2021-02-01 DIAGNOSIS — N2581 Secondary hyperparathyroidism of renal origin: Secondary | ICD-10-CM | POA: Diagnosis not present

## 2021-02-01 DIAGNOSIS — E876 Hypokalemia: Secondary | ICD-10-CM | POA: Diagnosis not present

## 2021-02-02 ENCOUNTER — Other Ambulatory Visit: Payer: Self-pay

## 2021-02-02 ENCOUNTER — Ambulatory Visit (INDEPENDENT_AMBULATORY_CARE_PROVIDER_SITE_OTHER): Payer: Medicare Other | Admitting: Physician Assistant

## 2021-02-02 ENCOUNTER — Ambulatory Visit (HOSPITAL_COMMUNITY)
Admission: RE | Admit: 2021-02-02 | Discharge: 2021-02-02 | Disposition: A | Discharge status: Home / Self Care | Payer: Medicare Other | Source: Ambulatory Visit | Attending: Vascular Surgery | Admitting: Vascular Surgery

## 2021-02-02 VITALS — BP 145/70 | HR 103 | Temp 98.6°F | Resp 20 | Ht 64.0 in | Wt 142.2 lb

## 2021-02-02 DIAGNOSIS — N186 End stage renal disease: Secondary | ICD-10-CM

## 2021-02-02 NOTE — H&P (View-Only) (Signed)
  POST OPERATIVE DIALYSIS ACCESS OFFICE NOTE    CC:  F/u for dialysis access surgery; 6 weeks post-op  HPI:  This is a 70 y.o. male who is s/p left upper arm AV fistula creation, brachiobasilic by Dr Donnetta Hutching on 3/84/6659.  He denies hand pain or numbness.  If he falls asleep with his elbow at a 90 degree flexion he says his arm will go to sleep. He is dialyzing via right IJ [facility]  Dialysis days:  Tuesdays Thursdays and Saturdays   Dialysis center:  [facility]  Allergies  Allergen Reactions  . Latex Other (See Comments)    Unknown   . Methoxy Polyethylene Glycol-Epoetin Beta Other (See Comments)    Unknown    Current Outpatient Medications  Medication Sig Dispense Refill  . albuterol (VENTOLIN HFA) 108 (90 Base) MCG/ACT inhaler Inhale 1-2 puffs into the lungs every 4 (four) hours as needed for wheezing or shortness of breath. (Patient not taking: No sig reported)    . atorvastatin (LIPITOR) 20 MG tablet TAKE 1 TABLET(20 MG) BY MOUTH DAILY 90 tablet 2  . B Complex-C-Folic Acid (DIALYVITE TABLET) TABS Take 1 tablet by mouth daily.    . ferrous sulfate 325 (65 FE) MG tablet Take 325 mg by mouth every Sunday. (Patient not taking: Reported on 12/27/2020)    . HYDROcodone-acetaminophen (NORCO/VICODIN) 5-325 MG tablet Take 1 tablet by mouth every 6 (six) hours as needed for moderate pain. (Patient not taking: Reported on 12/27/2020) 8 tablet 0  . levothyroxine (SYNTHROID) 50 MCG tablet Take by mouth.    . pantoprazole (PROTONIX) 40 MG tablet Take 1 tablet (40 mg total) by mouth 2 (two) times daily. (Patient not taking: No sig reported) 60 tablet 2  . tiotropium (SPIRIVA HANDIHALER) 18 MCG inhalation capsule Place into inhaler and inhale. (Patient not taking: No sig reported)     No current facility-administered medications for this visit.     ROS:  See HPI Vitals:   02/02/21 1337  Weight: 142 lb 3.2 oz (64.5 kg)  Height: 5\' 4"  (1.626 m)    Physical Exam:  General appearance: WD, WN in  NAD Cardiac:RRR Respiratory:non-labored Incision:  Well healed Extremities:  5/5 hand grip strength,hand is warm, motor and sensation intact.  Good thrill and bruit in fistula  Dialysis duplex on 02/02/2021 Depth: 0.40-1.21 cm Diameter: 0.53-0.87 cm  Assessment/Plan:   -pt does not have evidence of steal syndrome -dialysis duplex today reveals fistula to be of adequate diameter for 2nd stage -we will make arrangements for this on non-dialysis day  Barbie Banner, PA-C 02/02/2021 1:36 PM [facility] [phone]  Clinic MD:  Dr. Trula Slade on-call

## 2021-02-02 NOTE — Progress Notes (Signed)
  POST OPERATIVE DIALYSIS ACCESS OFFICE NOTE    CC:  F/u for dialysis access surgery; 6 weeks post-op  HPI:  This is a 70 y.o. male who is s/p left upper arm AV fistula creation, brachiobasilic by Dr Donnetta Hutching on 06/23/6059.  He denies hand pain or numbness.  If he falls asleep with his elbow at a 90 degree flexion he says his arm will go to sleep. He is dialyzing via right IJ [facility]  Dialysis days:  Tuesdays Thursdays and Saturdays   Dialysis center:  [facility]  Allergies  Allergen Reactions  . Latex Other (See Comments)    Unknown   . Methoxy Polyethylene Glycol-Epoetin Beta Other (See Comments)    Unknown    Current Outpatient Medications  Medication Sig Dispense Refill  . albuterol (VENTOLIN HFA) 108 (90 Base) MCG/ACT inhaler Inhale 1-2 puffs into the lungs every 4 (four) hours as needed for wheezing or shortness of breath. (Patient not taking: No sig reported)    . atorvastatin (LIPITOR) 20 MG tablet TAKE 1 TABLET(20 MG) BY MOUTH DAILY 90 tablet 2  . B Complex-C-Folic Acid (DIALYVITE TABLET) TABS Take 1 tablet by mouth daily.    . ferrous sulfate 325 (65 FE) MG tablet Take 325 mg by mouth every Sunday. (Patient not taking: Reported on 12/27/2020)    . HYDROcodone-acetaminophen (NORCO/VICODIN) 5-325 MG tablet Take 1 tablet by mouth every 6 (six) hours as needed for moderate pain. (Patient not taking: Reported on 12/27/2020) 8 tablet 0  . levothyroxine (SYNTHROID) 50 MCG tablet Take by mouth.    . pantoprazole (PROTONIX) 40 MG tablet Take 1 tablet (40 mg total) by mouth 2 (two) times daily. (Patient not taking: No sig reported) 60 tablet 2  . tiotropium (SPIRIVA HANDIHALER) 18 MCG inhalation capsule Place into inhaler and inhale. (Patient not taking: No sig reported)     No current facility-administered medications for this visit.     ROS:  See HPI Vitals:   02/02/21 1337  Weight: 142 lb 3.2 oz (64.5 kg)  Height: 5\' 4"  (1.626 m)    Physical Exam:  General appearance: WD, WN in  NAD Cardiac:RRR Respiratory:non-labored Incision:  Well healed Extremities:  5/5 hand grip strength,hand is warm, motor and sensation intact.  Good thrill and bruit in fistula  Dialysis duplex on 02/02/2021 Depth: 0.40-1.21 cm Diameter: 0.53-0.87 cm  Assessment/Plan:   -pt does not have evidence of steal syndrome -dialysis duplex today reveals fistula to be of adequate diameter for 2nd stage -we will make arrangements for this on non-dialysis day  Barbie Banner, PA-C 02/02/2021 1:36 PM [facility] [phone]  Clinic MD:  Dr. Trula Slade on-call

## 2021-02-03 DIAGNOSIS — D509 Iron deficiency anemia, unspecified: Secondary | ICD-10-CM | POA: Diagnosis not present

## 2021-02-03 DIAGNOSIS — N186 End stage renal disease: Secondary | ICD-10-CM | POA: Diagnosis not present

## 2021-02-03 DIAGNOSIS — N2581 Secondary hyperparathyroidism of renal origin: Secondary | ICD-10-CM | POA: Diagnosis not present

## 2021-02-03 DIAGNOSIS — D631 Anemia in chronic kidney disease: Secondary | ICD-10-CM | POA: Diagnosis not present

## 2021-02-03 DIAGNOSIS — Z992 Dependence on renal dialysis: Secondary | ICD-10-CM | POA: Diagnosis not present

## 2021-02-03 DIAGNOSIS — E876 Hypokalemia: Secondary | ICD-10-CM | POA: Diagnosis not present

## 2021-02-04 DIAGNOSIS — Z992 Dependence on renal dialysis: Secondary | ICD-10-CM | POA: Diagnosis not present

## 2021-02-04 DIAGNOSIS — T8610 Unspecified complication of kidney transplant: Secondary | ICD-10-CM | POA: Diagnosis not present

## 2021-02-04 DIAGNOSIS — N186 End stage renal disease: Secondary | ICD-10-CM | POA: Diagnosis not present

## 2021-02-05 ENCOUNTER — Other Ambulatory Visit (HOSPITAL_COMMUNITY)
Admission: RE | Admit: 2021-02-05 | Discharge: 2021-02-05 | Disposition: A | Discharge status: Home / Self Care | Payer: Medicare Other | Source: Ambulatory Visit | Attending: Vascular Surgery | Admitting: Vascular Surgery

## 2021-02-05 DIAGNOSIS — E039 Hypothyroidism, unspecified: Secondary | ICD-10-CM | POA: Diagnosis not present

## 2021-02-05 DIAGNOSIS — Z20822 Contact with and (suspected) exposure to covid-19: Secondary | ICD-10-CM | POA: Diagnosis not present

## 2021-02-05 DIAGNOSIS — N186 End stage renal disease: Secondary | ICD-10-CM | POA: Diagnosis not present

## 2021-02-05 DIAGNOSIS — Z01812 Encounter for preprocedural laboratory examination: Secondary | ICD-10-CM | POA: Insufficient documentation

## 2021-02-05 DIAGNOSIS — I10 Essential (primary) hypertension: Secondary | ICD-10-CM | POA: Diagnosis not present

## 2021-02-05 DIAGNOSIS — Z992 Dependence on renal dialysis: Secondary | ICD-10-CM | POA: Diagnosis not present

## 2021-02-06 ENCOUNTER — Other Ambulatory Visit: Payer: Self-pay

## 2021-02-06 ENCOUNTER — Encounter (HOSPITAL_COMMUNITY): Payer: Self-pay | Admitting: Vascular Surgery

## 2021-02-06 DIAGNOSIS — Z992 Dependence on renal dialysis: Secondary | ICD-10-CM | POA: Diagnosis not present

## 2021-02-06 DIAGNOSIS — D631 Anemia in chronic kidney disease: Secondary | ICD-10-CM | POA: Diagnosis not present

## 2021-02-06 DIAGNOSIS — E876 Hypokalemia: Secondary | ICD-10-CM | POA: Diagnosis not present

## 2021-02-06 DIAGNOSIS — D509 Iron deficiency anemia, unspecified: Secondary | ICD-10-CM | POA: Diagnosis not present

## 2021-02-06 DIAGNOSIS — N2581 Secondary hyperparathyroidism of renal origin: Secondary | ICD-10-CM | POA: Diagnosis not present

## 2021-02-06 DIAGNOSIS — Z23 Encounter for immunization: Secondary | ICD-10-CM | POA: Diagnosis not present

## 2021-02-06 DIAGNOSIS — N186 End stage renal disease: Secondary | ICD-10-CM | POA: Diagnosis not present

## 2021-02-06 LAB — SARS CORONAVIRUS 2 (TAT 6-24 HRS): SARS Coronavirus 2: NEGATIVE

## 2021-02-06 NOTE — Progress Notes (Signed)
PCP - Carlyle Lipa, NP  (per pt [facility] [place] - provider is also pt cousin?) Cardiologist -   Chest x-ray -  EKG - 10/15/20 Stress Test -  ECHO - 10/14/20 Cardiac Cath -   Per pt, he used to be diabetic, but no longer is.   COVID TEST- 02/05/21 negative   Anesthesia review: yes   -------------  SDW INSTRUCTIONS:  Your procedure is scheduled on 02/07/21. Please report to [facility] Main Entrance "A" at [place].M., and check in at the Admitting office. Call this number if you have problems the morning of surgery: [phone]   Remember: Do not eat or drink after midnight the night before your surgery  Medications to take morning of surgery with a sip of water include: albuterol (VENTOLIN HFA) --- Please bring all inhalers with you the day of surgery.  atorvastatin (LIPITOR)  levothyroxine (SYNTHROID)  pantoprazole (PROTONIX)  As of today, STOP taking any Aspirin (unless otherwise instructed by your surgeon), Aleve, Naproxen, Ibuprofen, Motrin, Advil, Goody's, BC's, all herbal medications, fish oil, and all vitamins.    The Morning of Surgery Do not wear jewelry Do not wear lotions, powders, colognes, or deodorant   Men may shave face and neck. Do not bring valuables to the hospital. [facility] is not responsible for any belongings or valuables. If you are a smoker, DO NOT Smoke 24 hours prior to surgery If you wear a CPAP at night please bring your mask the morning of surgery  Remember that you must have someone to transport you home after your surgery, and remain with you for 24 hours if you are discharged the same day. Please bring cases for contacts, glasses, hearing aids, dentures or bridgework because it cannot be worn into surgery.   Patients discharged the day of surgery will not be allowed to drive home.   Please shower the NIGHT BEFORE SURGERY and the MORNING OF SURGERY with DIAL Soap. Wear comfortable clothes the morning of surgery. Oral Hygiene is also  important to reduce your risk of infection.  Remember - BRUSH YOUR TEETH THE MORNING OF SURGERY WITH YOUR REGULAR TOOTHPASTE  Patient denies shortness of breath, fever, cough and chest pain.

## 2021-02-06 NOTE — Progress Notes (Addendum)
Anesthesia Chart Review: Same day workup  Pertinent history includes former smoker (18.5 pack years, quit ~6 months ago), chronic hep C, DM2 (diet-controlled), CAD (CABG 2008), s/p kidney transplant (1974 and 1982) with recurrent ESRD on HD, left lower lobe lung cancer s/p SBRT 11/28/2020 (no plan for chemo or surgery), hypothyroid.  Of note patient was admitted in January 2022 for COVID infection.  Treated with antibiotics, steroids, remdesivir.  He did not require supplemental oxygen.  Follows with oncology for history of LLL cancer s/p SBRT completed 11/26/2020.  Last seen 01/15/2021 and noted to be doing well since completing radiation therapy, patient stated that his cough had resolved at that time.  No chemo or surgery is planned.  Cardiologist is Dr. Darral Dash at [place] seen 08/04/2018.  Stable at that time without anginal symptoms.  Last seen by PCP Dr. Reinaldo Meeker on 12/27/2020, chronic conditions noted to be stable.  Will need day of surgery labs and evaluation.  EKG 10/15/2020: NSR.  Rate 79.  CTA chest 10/13/2020: IMPRESSION: 1. No pulmonary embolus or acute intrathoracic abnormality. 2. Improved right pleural effusion from prior exam. Trace left pleural effusion is unchanged. 3. Unchanged left lower lobe pulmonary nodule. 4. Grossly stable interstitial lung disease allowing for motion on the current exam.  TTE 10/14/2020: 1. Left ventricular ejection fraction, by estimation, is 55 to 60%. The  left ventricle has normal function. The left ventricle has no regional  wall motion abnormalities. There is moderate left ventricular hypertrophy.  Left ventricular diastolic  parameters are indeterminate.  2. Right ventricular systolic function is normal. The right ventricular  size is normal. There is normal pulmonary artery systolic pressure.  3. The mitral valve is grossly normal. Mild to moderate mitral valve  regurgitation.  4. The aortic valve is normal in structure.  Aortic valve regurgitation is  trivial.    Wynonia Musty [facility] Short Stay Center/Anesthesiology Phone [phone] 02/06/2021 1:17 PM

## 2021-02-06 NOTE — Anesthesia Preprocedure Evaluation (Addendum)
Anesthesia Evaluation  Patient identified by MRN, date of birth, ID band Patient awake    Reviewed: Allergy & Precautions, NPO status , Patient's Chart, lab work & pertinent test results  History of Anesthesia Complications Negative for: history of anesthetic complications  Airway Mallampati: IV  TM Distance: >3 FB Neck ROM: Full    Dental  (+) Poor Dentition, Missing, Loose, Dental Advisory Given   Pulmonary COPD,  COPD inhaler, former smoker,  02/05/2021 SARS coronavirus NEG   breath sounds clear to auscultation       Cardiovascular hypertension, + CAD and + CABG   Rhythm:Regular Rate:Normal  10/2020 ECHO: EF 55-60%. LV hasnormal function, with no regional wall motion abnormalities, mod LVH. Mild to moderate MR.    Neuro/Psych Depression negative neurological ROS     GI/Hepatic Neg liver ROS, GERD  Medicated and Controlled,  Endo/Other  diabetes (glu 116)Hypothyroidism   Renal/GU Dialysis and ESRFRenal disease (K+ 3.9, TuThSa)     Musculoskeletal  (+) Arthritis ,   Abdominal   Peds  Hematology   Anesthesia Other Findings   Reproductive/Obstetrics                           Anesthesia Physical Anesthesia Plan  ASA: III  Anesthesia Plan: Regional and MAC   Post-op Pain Management:    Induction:   PONV Risk Score and Plan: 1 and Ondansetron and Treatment may vary due to age or medical condition  Airway Management Planned: Natural Airway and Simple Face Mask  Additional Equipment: None  Intra-op Plan:   Post-operative Plan:   Informed Consent: I have reviewed the patients History and Physical, chart, labs and discussed the procedure including the risks, benefits and alternatives for the proposed anesthesia with the patient or authorized representative who has indicated his/her understanding and acceptance.     Dental advisory given  Plan Discussed with: CRNA and  Surgeon  Anesthesia Plan Comments: (Plan routine monitors, Supraclavicular block with sedation   PAT note by Karoline Caldwell, PA-C:  Pertinent history includesformer smoker (18.5 pack years, quit ~6 months ago), chronic hep C, DM2 (diet-controlled), CAD (CABG 2008), s/p kidney transplant(1974 and 1982)with recurrent ESRD on HD, left lower lobe lung cancer s/p SBRT 11/28/2020 (no plan for chemo or surgery), hypothyroid.  Of note patient was admitted in January 2022 for COVID infection.  Treated with antibiotics, steroids, remdesivir.  He did not require supplemental oxygen.  Follows with oncology for history of LLL cancer s/p SBRT completed 11/26/2020.  Last seen 01/15/2021 and noted to be doing well since completing radiation therapy, patient stated that his cough had resolved at that time.  No chemo or surgery is planned.  Cardiologist is Dr. Darral Dash at [place] seen 08/04/2018.  Stable at that time without anginal symptoms.  Last seen by PCP Dr. Reinaldo Meeker on 12/27/2020, chronic conditions noted to be stable.  Will need day of surgery labs and evaluation.  EKG 10/15/2020: NSR.  Rate 79.  CTA chest 10/13/2020: IMPRESSION: 1. No pulmonary embolus or acute intrathoracic abnormality. 2. Improved right pleural effusion from prior exam. Trace left pleural effusion is unchanged. 3. Unchanged left lower lobe pulmonary nodule. 4. Grossly stable interstitial lung disease allowing for motion on the current exam.  TTE 10/14/2020: 1. Left ventricular ejection fraction, by estimation, is 55 to 60%. The  left ventricle has normal function. The left ventricle has no regional  wall motion abnormalities. There is moderate left ventricular hypertrophy.  Left ventricular diastolic  parameters are indeterminate.  2. Right ventricular systolic function is normal. The right ventricular  size is normal. There is normal pulmonary artery systolic pressure.  3. The mitral valve is grossly  normal. Mild to moderate mitral valve  regurgitation.  4. The aortic valve is normal in structure. Aortic valve regurgitation is  trivial.   )     Anesthesia Quick Evaluation

## 2021-02-07 ENCOUNTER — Ambulatory Visit (HOSPITAL_COMMUNITY)
Admission: RE | Admit: 2021-02-07 | Discharge: 2021-02-07 | Disposition: A | Discharge status: Home / Self Care | Payer: Medicare Other | Attending: Vascular Surgery | Admitting: Vascular Surgery

## 2021-02-07 ENCOUNTER — Ambulatory Visit (HOSPITAL_COMMUNITY): Payer: Medicare Other | Admitting: Physician Assistant

## 2021-02-07 ENCOUNTER — Encounter (HOSPITAL_COMMUNITY): Payer: Self-pay | Admitting: Vascular Surgery

## 2021-02-07 ENCOUNTER — Encounter (HOSPITAL_COMMUNITY)
Admission: RE | Disposition: A | Discharge status: Home / Self Care | Payer: Self-pay | Source: Home / Self Care | Attending: Vascular Surgery

## 2021-02-07 DIAGNOSIS — I2510 Atherosclerotic heart disease of native coronary artery without angina pectoris: Secondary | ICD-10-CM | POA: Diagnosis not present

## 2021-02-07 DIAGNOSIS — B182 Chronic viral hepatitis C: Secondary | ICD-10-CM | POA: Diagnosis not present

## 2021-02-07 DIAGNOSIS — Z992 Dependence on renal dialysis: Secondary | ICD-10-CM | POA: Insufficient documentation

## 2021-02-07 DIAGNOSIS — Z923 Personal history of irradiation: Secondary | ICD-10-CM | POA: Insufficient documentation

## 2021-02-07 DIAGNOSIS — E039 Hypothyroidism, unspecified: Secondary | ICD-10-CM | POA: Insufficient documentation

## 2021-02-07 DIAGNOSIS — Z940 Kidney transplant status: Secondary | ICD-10-CM | POA: Diagnosis not present

## 2021-02-07 DIAGNOSIS — N186 End stage renal disease: Secondary | ICD-10-CM | POA: Insufficient documentation

## 2021-02-07 DIAGNOSIS — Z79890 Hormone replacement therapy (postmenopausal): Secondary | ICD-10-CM | POA: Insufficient documentation

## 2021-02-07 DIAGNOSIS — E1122 Type 2 diabetes mellitus with diabetic chronic kidney disease: Secondary | ICD-10-CM | POA: Insufficient documentation

## 2021-02-07 DIAGNOSIS — Z452 Encounter for adjustment and management of vascular access device: Secondary | ICD-10-CM | POA: Diagnosis not present

## 2021-02-07 DIAGNOSIS — Z85118 Personal history of other malignant neoplasm of bronchus and lung: Secondary | ICD-10-CM | POA: Diagnosis not present

## 2021-02-07 DIAGNOSIS — Z951 Presence of aortocoronary bypass graft: Secondary | ICD-10-CM | POA: Insufficient documentation

## 2021-02-07 DIAGNOSIS — D631 Anemia in chronic kidney disease: Secondary | ICD-10-CM | POA: Diagnosis not present

## 2021-02-07 DIAGNOSIS — Z79899 Other long term (current) drug therapy: Secondary | ICD-10-CM | POA: Diagnosis not present

## 2021-02-07 DIAGNOSIS — Z888 Allergy status to other drugs, medicaments and biological substances status: Secondary | ICD-10-CM | POA: Diagnosis not present

## 2021-02-07 DIAGNOSIS — Z8616 Personal history of COVID-19: Secondary | ICD-10-CM | POA: Diagnosis not present

## 2021-02-07 DIAGNOSIS — N185 Chronic kidney disease, stage 5: Secondary | ICD-10-CM | POA: Diagnosis not present

## 2021-02-07 DIAGNOSIS — Z87891 Personal history of nicotine dependence: Secondary | ICD-10-CM | POA: Diagnosis not present

## 2021-02-07 DIAGNOSIS — Z91040 Latex allergy status: Secondary | ICD-10-CM | POA: Diagnosis not present

## 2021-02-07 DIAGNOSIS — I1311 Hypertensive heart and chronic kidney disease without heart failure, with stage 5 chronic kidney disease, or end stage renal disease: Secondary | ICD-10-CM | POA: Diagnosis not present

## 2021-02-07 DIAGNOSIS — T82898A Other specified complication of vascular prosthetic devices, implants and grafts, initial encounter: Secondary | ICD-10-CM | POA: Diagnosis not present

## 2021-02-07 DIAGNOSIS — R202 Paresthesia of skin: Secondary | ICD-10-CM | POA: Insufficient documentation

## 2021-02-07 HISTORY — PX: BASCILIC VEIN TRANSPOSITION: SHX5742

## 2021-02-07 LAB — POCT I-STAT, CHEM 8
BUN: 24 mg/dL — ABNORMAL HIGH (ref 8–23)
Calcium, Ion: 1.05 mmol/L — ABNORMAL LOW (ref 1.15–1.40)
Chloride: 100 mmol/L (ref 98–111)
Creatinine, Ser: 4.5 mg/dL — ABNORMAL HIGH (ref 0.61–1.24)
Glucose, Bld: 116 mg/dL — ABNORMAL HIGH (ref 70–99)
HCT: 29 % — ABNORMAL LOW (ref 39.0–52.0)
Hemoglobin: 9.9 g/dL — ABNORMAL LOW (ref 13.0–17.0)
Potassium: 3.9 mmol/L (ref 3.5–5.1)
Sodium: 138 mmol/L (ref 135–145)
TCO2: 28 mmol/L (ref 22–32)

## 2021-02-07 SURGERY — TRANSPOSITION, VEIN, BASILIC
Anesthesia: Monitor Anesthesia Care | Site: Arm Upper | Laterality: Left

## 2021-02-07 MED ORDER — MIDAZOLAM HCL 2 MG/2ML IJ SOLN: 1.0000 mg | Freq: Once | INTRAMUSCULAR | Status: AC

## 2021-02-07 MED ORDER — LIDOCAINE-EPINEPHRINE (PF) 1.5 %-1:200000 IJ SOLN
INTRAMUSCULAR | Status: DC | PRN
  Administered 2021-02-07: 30 mL via PERINEURAL

## 2021-02-07 MED ORDER — PROPOFOL 10 MG/ML IV BOLUS
INTRAVENOUS | Status: AC
  Filled 2021-02-07: qty 20

## 2021-02-07 MED ORDER — 0.9 % SODIUM CHLORIDE (POUR BTL) OPTIME
TOPICAL | Status: DC | PRN
  Administered 2021-02-07: 1000 mL

## 2021-02-07 MED ORDER — OXYCODONE HCL 5 MG/5ML PO SOLN: 5.0000 mg | Freq: Once | ORAL | Status: DC | PRN

## 2021-02-07 MED ORDER — FENTANYL CITRATE (PF) 100 MCG/2ML IJ SOLN
50.0000 ug | Freq: Once | INTRAMUSCULAR | Status: AC
Start: 2021-02-07 — End: 2021-02-07

## 2021-02-07 MED ORDER — ONDANSETRON HCL 4 MG/2ML IJ SOLN
INTRAMUSCULAR | Status: AC
  Filled 2021-02-07: qty 2

## 2021-02-07 MED ORDER — CHLORHEXIDINE GLUCONATE 0.12 % MT SOLN: 15.0000 mL | Freq: Once | OROMUCOSAL | Status: AC

## 2021-02-07 MED ORDER — PROPOFOL 500 MG/50ML IV EMUL
INTRAVENOUS | Status: DC | PRN
  Administered 2021-02-07: 75 ug/kg/min via INTRAVENOUS

## 2021-02-07 MED ORDER — MIDAZOLAM HCL 2 MG/2ML IJ SOLN
INTRAMUSCULAR | Status: AC
  Administered 2021-02-07: 1 mg via INTRAVENOUS
  Filled 2021-02-07: qty 2

## 2021-02-07 MED ORDER — CHLORHEXIDINE GLUCONATE 4 % EX LIQD: 60.0000 mL | Freq: Once | CUTANEOUS | Status: DC

## 2021-02-07 MED ORDER — MIDAZOLAM HCL 2 MG/2ML IJ SOLN: 0.5000 mg | Freq: Once | INTRAMUSCULAR | Status: DC | PRN

## 2021-02-07 MED ORDER — SODIUM CHLORIDE 0.9 % IV SOLN: INTRAVENOUS | Status: DC

## 2021-02-07 MED ORDER — SODIUM CHLORIDE 0.9 % IV SOLN
INTRAVENOUS | Status: AC
  Filled 2021-02-07: qty 1.2

## 2021-02-07 MED ORDER — ORAL CARE MOUTH RINSE
15.0000 mL | Freq: Once | OROMUCOSAL | Status: AC
Start: 2021-02-07 — End: 2021-02-07
  Administered 2021-02-07: 15 mL via OROMUCOSAL

## 2021-02-07 MED ORDER — LIDOCAINE-EPINEPHRINE 1 %-1:100000 IJ SOLN
INTRAMUSCULAR | Status: AC
  Filled 2021-02-07: qty 1

## 2021-02-07 MED ORDER — PHENYLEPHRINE 40 MCG/ML (10ML) SYRINGE FOR IV PUSH (FOR BLOOD PRESSURE SUPPORT)
PREFILLED_SYRINGE | INTRAVENOUS | Status: AC
  Filled 2021-02-07: qty 10

## 2021-02-07 MED ORDER — FENTANYL CITRATE (PF) 100 MCG/2ML IJ SOLN
INTRAMUSCULAR | Status: AC
  Administered 2021-02-07: 50 ug via INTRAVENOUS
  Filled 2021-02-07: qty 2

## 2021-02-07 MED ORDER — FENTANYL CITRATE (PF) 100 MCG/2ML IJ SOLN: 25.0000 ug | INTRAMUSCULAR | Status: DC | PRN

## 2021-02-07 MED ORDER — MEPERIDINE HCL 25 MG/ML IJ SOLN: 6.2500 mg | INTRAMUSCULAR | Status: DC | PRN

## 2021-02-07 MED ORDER — LIDOCAINE HCL 1 % IJ SOLN
INTRAMUSCULAR | Status: DC | PRN
  Administered 2021-02-07: 10 mL

## 2021-02-07 MED ORDER — HYDROCODONE-ACETAMINOPHEN 5-325 MG PO TABS: 1.0000 | ORAL_TABLET | Freq: Four times a day (QID) | ORAL | 0 refills | Status: DC | PRN

## 2021-02-07 MED ORDER — SODIUM CHLORIDE 0.9 % IV SOLN
INTRAVENOUS | Status: DC | PRN
  Administered 2021-02-07: 500 mL

## 2021-02-07 MED ORDER — FENTANYL CITRATE (PF) 250 MCG/5ML IJ SOLN
INTRAMUSCULAR | Status: DC | PRN
  Administered 2021-02-07: 50 ug via INTRAVENOUS

## 2021-02-07 MED ORDER — LIDOCAINE HCL (PF) 1 % IJ SOLN
INTRAMUSCULAR | Status: AC
  Filled 2021-02-07: qty 30

## 2021-02-07 MED ORDER — OXYCODONE HCL 5 MG PO TABS: 5.0000 mg | ORAL_TABLET | Freq: Once | ORAL | Status: DC | PRN

## 2021-02-07 MED ORDER — PHENYLEPHRINE HCL-NACL 10-0.9 MG/250ML-% IV SOLN
INTRAVENOUS | Status: DC | PRN
  Administered 2021-02-07: 50 ug/min via INTRAVENOUS

## 2021-02-07 MED ORDER — CEFAZOLIN SODIUM-DEXTROSE 2-4 GM/100ML-% IV SOLN
2.0000 g | INTRAVENOUS | Status: AC
  Administered 2021-02-07: 2 g via INTRAVENOUS
  Filled 2021-02-07 (×2): qty 100

## 2021-02-07 MED ORDER — FENTANYL CITRATE (PF) 250 MCG/5ML IJ SOLN
INTRAMUSCULAR | Status: AC
  Filled 2021-02-07: qty 5

## 2021-02-07 SURGICAL SUPPLY — 32 items
ARMBAND PINK RESTRICT EXTREMIT (MISCELLANEOUS) ×3 IMPLANT
CANISTER SUCT 3000ML PPV (MISCELLANEOUS) ×3 IMPLANT
CLIP VESOCCLUDE MED 24/CT (CLIP) IMPLANT
CLIP VESOCCLUDE MED 6/CT (CLIP) IMPLANT
CLIP VESOCCLUDE SM WIDE 24/CT (CLIP) IMPLANT
CLIP VESOCCLUDE SM WIDE 6/CT (CLIP) IMPLANT
COVER PROBE W GEL 5X96 (DRAPES) IMPLANT
COVER WAND RF STERILE (DRAPES) IMPLANT
DECANTER SPIKE VIAL GLASS SM (MISCELLANEOUS) ×3 IMPLANT
DERMABOND ADVANCED (GAUZE/BANDAGES/DRESSINGS) ×2
DERMABOND ADVANCED .7 DNX12 (GAUZE/BANDAGES/DRESSINGS) ×1 IMPLANT
ELECT REM PT RETURN 9FT ADLT (ELECTROSURGICAL) ×3
ELECTRODE REM PT RTRN 9FT ADLT (ELECTROSURGICAL) ×1 IMPLANT
GLOVE BIO SURGEON STRL SZ7.5 (GLOVE) ×3 IMPLANT
GOWN STRL REUS W/ TWL LRG LVL3 (GOWN DISPOSABLE) ×2 IMPLANT
GOWN STRL REUS W/ TWL XL LVL3 (GOWN DISPOSABLE) ×1 IMPLANT
GOWN STRL REUS W/TWL LRG LVL3 (GOWN DISPOSABLE) ×4
GOWN STRL REUS W/TWL XL LVL3 (GOWN DISPOSABLE) ×2
KIT BASIN OR (CUSTOM PROCEDURE TRAY) ×3 IMPLANT
KIT TURNOVER KIT B (KITS) ×3 IMPLANT
NS IRRIG 1000ML POUR BTL (IV SOLUTION) ×3 IMPLANT
PACK CV ACCESS (CUSTOM PROCEDURE TRAY) ×3 IMPLANT
PAD ARMBOARD 7.5X6 YLW CONV (MISCELLANEOUS) ×6 IMPLANT
SUT MNCRL AB 4-0 PS2 18 (SUTURE) ×6 IMPLANT
SUT PROLENE 6 0 BV (SUTURE) ×3 IMPLANT
SUT SILK 2 0 SH (SUTURE) IMPLANT
SUT VIC AB 3-0 SH 27 (SUTURE) ×4
SUT VIC AB 3-0 SH 27X BRD (SUTURE) ×2 IMPLANT
SYR CONTROL 10ML LL (SYRINGE) ×3 IMPLANT
TOWEL GREEN STERILE (TOWEL DISPOSABLE) ×3 IMPLANT
UNDERPAD 30X36 HEAVY ABSORB (UNDERPADS AND DIAPERS) ×3 IMPLANT
WATER STERILE IRR 1000ML POUR (IV SOLUTION) ×3 IMPLANT

## 2021-02-07 NOTE — Transfer of Care (Signed)
Immediate Anesthesia Transfer of Care Note  Patient: Victor Schultz  Procedure(s) Performed: LEFT SECOND STAGE BASCILIC VEIN TRANSPOSITION (Left Arm Upper)  Patient Location: PACU  Anesthesia Type:MAC and Regional  Level of Consciousness: awake, alert  and oriented  Airway & Oxygen Therapy: Patient Spontanous Breathing  Post-op Assessment: Report given to RN and Post -op Vital signs reviewed and stable  Post vital signs: Reviewed and stable  Last Vitals:  Vitals Value Taken Time  BP    Temp    Pulse    Resp 22 02/07/21 1407  SpO2    Vitals shown include unvalidated device data.  Last Pain:  Vitals:   02/07/21 1013  TempSrc:   PainSc: 0-No pain      Patients Stated Pain Goal: 6 (39/12/25 8346)  Complications: No complications documented.

## 2021-02-07 NOTE — Progress Notes (Signed)
Orthopedic Tech Progress Note Patient Details:  Victor Schultz Feb 10, 1951 [id number] PACU RN called requesting an ARM SLING Ortho Devices Type of Ortho Device: Arm sling Ortho Device/Splint Interventions: Other (comment)   Post Interventions Patient Tolerated: Well Instructions Provided: Care of [place] 02/07/2021, 2:28 PM

## 2021-02-07 NOTE — Op Note (Signed)
    Patient name: Nguyen Todorov MRN: [id number] DOB: Mar 03, 1951 Sex: male  02/07/2021 Pre-operative Diagnosis: ers Post-operative diagnosis:  Same Surgeon:  Erlene Quan C. Donzetta Matters, MD Assistant: Laurence Slate, PA Procedure Performed:  Left arm basilic vein AV fistula revision with transposition  Indications: 70 year old male with end-stage renal disease currently dialyzing via catheter.  He has a previously failed left forearm AV graft.  He has a recently placed left arm basilic vein fistula.  He is now indicated for revision with transposition.  An assistant was necessary to expedite the case.  Findings: Fistula throughout its course measured greater than 0.5 mm.  There was 1 large branch midway through that was divided.  At completion there was a very strong thrill in the fistula and a palpable radial artery pulse the wrist.  Both these were confirmed with Doppler.   Procedure:  The patient was identified in the holding area and taken to the operating was placed supine on operative table.  Preoperative block of been placed.  He was sterilely prepped draped in left upper extremity, MAC anesthesia was induced and a timeout was called.  The block was checked noted be intact.  We reopened the previous incision above the antecubitum.  We identified the vein taken back to the arterial anastomosis.  We protected the nerve throughout its course.  2 separate skip incisions were made.  Breast was divided between clips and ties.  When the entirety the vein to be dissected free we marked for orientation.  We then clamped near the previous anastomosis and transected.  We then tunneled laterally and flushed with heparinized saline and clamped the vein.  We spatulated both ends and sewed them end-to-end with 6-0 Prolene suture.  Prior to completion without flushing or EXTR.  On completion there was very strong thrill in the fistula.  There is a palpable radial artery pulse the wrist.  Both these were confirmed with Doppler.   The radial artery signal did augment with compression of the fistula.  Satisfied with this we irrigated the wounds obtaining stasis and closed in layers of Vicryl and Monocryl.  Dermabond placed over the skin.  He was awake from anesthesia having tolerated procedure without any complication.  Counts were correct at completion.   EBL: 100cc  Teneil Shiller C. Donzetta Matters, MD [facility] [place] Office: [phone] Pager: [phone]

## 2021-02-07 NOTE — Discharge Instructions (Signed)
° °  [facility] [place] ° °Discharge Instructions ° °AV Fistula or Graft Surgery for Dialysis Access ° °Please refer to the following instructions for your post-procedure care. Your surgeon or physician assistant will discuss any changes with you. ° °Activity ° °You may drive the day following your surgery, if you are comfortable and no longer taking prescription pain medication. Resume full activity as the soreness in your incision resolves. ° °Bathing/Showering ° °You may shower after you go home. Keep your incision dry for 48 hours. Do not soak in a bathtub, hot tub, or swim until the incision heals completely. You may not shower if you have a hemodialysis catheter. ° °Incision Care ° °Clean your incision with mild soap and water after 48 hours. Pat the area dry with a clean towel. You do not need a bandage unless otherwise instructed. Do not apply any ointments or creams to your incision. You may have skin glue on your incision. Do not peel it off. It will come off on its own in about one week. Your arm may swell a bit after surgery. To reduce swelling use pillows to elevate your arm so it is above your heart. Your doctor will tell you if you need to lightly wrap your arm with an ACE bandage. ° °Diet ° °Resume your normal diet. There are not special food restrictions following this procedure. In order to heal from your surgery, it is CRITICAL to get adequate nutrition. Your body requires vitamins, minerals, and protein. Vegetables are the best source of vitamins and minerals. Vegetables also provide the perfect balance of protein. Processed food has little nutritional value, so try to avoid this. ° °Medications ° °Resume taking all of your medications. If your incision is causing pain, you may take over-the counter pain relievers such as acetaminophen (Tylenol). If you were prescribed a stronger pain medication, please be aware these medications can cause nausea and constipation. Prevent  nausea by taking the medication with a snack or meal. Avoid constipation by drinking plenty of fluids and eating foods with high amount of fiber, such as fruits, vegetables, and grains. Do not take Tylenol if you are taking prescription pain medications. ° ° ° ° °Follow up °Your surgeon may want to see you in the office following your access surgery. If so, this will be arranged at the time of your surgery. ° °Please call us immediately for any of the following conditions: ° °Increased pain, redness, drainage (pus) from your incision site °Fever of 101 degrees or higher °Severe or worsening pain at your incision site °Hand pain or numbness. ° °Reduce your risk of vascular disease: ° °Stop smoking. If you would like help, call [facility] at 1-800-QUIT-NOW (1-800-784-8669) or  at 336-586-4000 ° °Manage your cholesterol °Maintain a desired weight °Control your diabetes °Keep your blood pressure down ° °Dialysis ° °It will take several weeks to several months for your new dialysis access to be ready for use. Your surgeon will determine when it is OK to use it. Your nephrologist will continue to direct your dialysis. You can continue to use your Permcath until your new access is ready for use. ° °If you have any questions, please call the office at [phone]. ° °

## 2021-02-07 NOTE — Interval H&P Note (Signed)
History and Physical Interval Note:  02/07/2021 10:08 AM  Victor Schultz  has presented today for surgery, with the diagnosis of ESRD.  The various methods of treatment have been discussed with the patient and family. After consideration of risks, benefits and other options for treatment, the patient has consented to  Procedure(s): LEFT SECOND STAGE [place] (Left) as a surgical intervention.  The patient's history has been reviewed, patient examined, no change in status, stable for surgery.  I have reviewed the patient's chart and labs.  Questions were answered to the patient's satisfaction.     Servando Snare

## 2021-02-07 NOTE — Anesthesia Procedure Notes (Signed)
Procedure Name: MAC Date/Time: 02/07/2021 12:32 PM Performed by: Babs Bertin, CRNA Pre-anesthesia Checklist: Patient identified, Emergency Drugs available, Suction available, Patient being monitored and Timeout performed Patient Re-evaluated:Patient Re-evaluated prior to induction Oxygen Delivery Method: Nasal cannula

## 2021-02-07 NOTE — Anesthesia Postprocedure Evaluation (Signed)
Anesthesia Post Note  Patient: Victor Schultz  Procedure(s) Performed: LEFT SECOND STAGE BASCILIC VEIN TRANSPOSITION (Left Arm Upper)     Patient location during evaluation: PACU Anesthesia Type: Regional Level of consciousness: patient cooperative, awake and alert and oriented Pain management: pain level controlled Vital Signs Assessment: post-procedure vital signs reviewed and stable Respiratory status: spontaneous breathing, respiratory function stable and nonlabored ventilation Cardiovascular status: blood pressure returned to baseline and stable Postop Assessment: no apparent nausea or vomiting Anesthetic complications: no   No complications documented.  Last Vitals:  Vitals:   02/07/21 1425 02/07/21 1440  BP: (!) 109/52 (!) 111/53  Pulse: 93 97  Resp: (!) 22 20  Temp:  36.4 C  SpO2: 92% 94%    Last Pain:  Vitals:   02/07/21 1440  TempSrc:   PainSc: 0-No pain                 Lillyth Spong,E. Netanel Yannuzzi

## 2021-02-07 NOTE — Anesthesia Procedure Notes (Signed)
Anesthesia Regional Block: Supraclavicular block   Pre-Anesthetic Checklist: ,, timeout performed, Correct Patient, Correct Site, Correct Laterality, Correct Procedure, Correct Position, site marked, Risks and benefits discussed,  Surgical consent,  Pre-op evaluation,  At surgeon's request and post-op pain management  Laterality: Left and Upper  Prep: chloraprep       Needles:  Injection technique: Single-shot  Needle Type: Echogenic Needle     Needle Length: 9cm  Needle Gauge: 21     Additional Needles:   Procedures:,,,, ultrasound used (permanent image in chart),,,,  Narrative:  Start time: 02/07/2021 11:34 AM End time: 02/07/2021 11:40 AM Injection made incrementally with aspirations every 5 mL.  Performed by: Personally  Anesthesiologist: Annye Asa, MD  Additional Notes: Pt identified in Holding room.  Monitors applied. Working IV access confirmed. Sterile prep L clavicle and neck.  #21ga ECHOgenic Arrow block needle to supraclav brachial plexus with US guidance.  25cc 1.5% Lidocaine with 1:200k epi injected incrementally after negative test dose.  Patient asymptomatic, VSS, no heme aspirated, tolerated well.  Jenita Seashore, MD

## 2021-02-08 ENCOUNTER — Encounter (HOSPITAL_COMMUNITY): Payer: Self-pay | Admitting: Vascular Surgery

## 2021-02-08 DIAGNOSIS — D509 Iron deficiency anemia, unspecified: Secondary | ICD-10-CM | POA: Diagnosis not present

## 2021-02-08 DIAGNOSIS — E876 Hypokalemia: Secondary | ICD-10-CM | POA: Diagnosis not present

## 2021-02-08 DIAGNOSIS — N186 End stage renal disease: Secondary | ICD-10-CM | POA: Diagnosis not present

## 2021-02-08 DIAGNOSIS — N2581 Secondary hyperparathyroidism of renal origin: Secondary | ICD-10-CM | POA: Diagnosis not present

## 2021-02-08 DIAGNOSIS — Z992 Dependence on renal dialysis: Secondary | ICD-10-CM | POA: Diagnosis not present

## 2021-02-08 DIAGNOSIS — D631 Anemia in chronic kidney disease: Secondary | ICD-10-CM | POA: Diagnosis not present

## 2021-02-10 DIAGNOSIS — N186 End stage renal disease: Secondary | ICD-10-CM | POA: Diagnosis not present

## 2021-02-10 DIAGNOSIS — D509 Iron deficiency anemia, unspecified: Secondary | ICD-10-CM | POA: Diagnosis not present

## 2021-02-10 DIAGNOSIS — N2581 Secondary hyperparathyroidism of renal origin: Secondary | ICD-10-CM | POA: Diagnosis not present

## 2021-02-10 DIAGNOSIS — E876 Hypokalemia: Secondary | ICD-10-CM | POA: Diagnosis not present

## 2021-02-10 DIAGNOSIS — Z992 Dependence on renal dialysis: Secondary | ICD-10-CM | POA: Diagnosis not present

## 2021-02-10 DIAGNOSIS — D631 Anemia in chronic kidney disease: Secondary | ICD-10-CM | POA: Diagnosis not present

## 2021-02-13 DIAGNOSIS — N2581 Secondary hyperparathyroidism of renal origin: Secondary | ICD-10-CM | POA: Diagnosis not present

## 2021-02-13 DIAGNOSIS — D631 Anemia in chronic kidney disease: Secondary | ICD-10-CM | POA: Diagnosis not present

## 2021-02-13 DIAGNOSIS — N186 End stage renal disease: Secondary | ICD-10-CM | POA: Diagnosis not present

## 2021-02-13 DIAGNOSIS — D509 Iron deficiency anemia, unspecified: Secondary | ICD-10-CM | POA: Diagnosis not present

## 2021-02-13 DIAGNOSIS — Z992 Dependence on renal dialysis: Secondary | ICD-10-CM | POA: Diagnosis not present

## 2021-02-13 DIAGNOSIS — E876 Hypokalemia: Secondary | ICD-10-CM | POA: Diagnosis not present

## 2021-02-15 DIAGNOSIS — N186 End stage renal disease: Secondary | ICD-10-CM | POA: Diagnosis not present

## 2021-02-15 DIAGNOSIS — Z992 Dependence on renal dialysis: Secondary | ICD-10-CM | POA: Diagnosis not present

## 2021-02-15 DIAGNOSIS — D631 Anemia in chronic kidney disease: Secondary | ICD-10-CM | POA: Diagnosis not present

## 2021-02-15 DIAGNOSIS — D509 Iron deficiency anemia, unspecified: Secondary | ICD-10-CM | POA: Diagnosis not present

## 2021-02-15 DIAGNOSIS — N2581 Secondary hyperparathyroidism of renal origin: Secondary | ICD-10-CM | POA: Diagnosis not present

## 2021-02-15 DIAGNOSIS — E876 Hypokalemia: Secondary | ICD-10-CM | POA: Diagnosis not present

## 2021-02-17 DIAGNOSIS — D509 Iron deficiency anemia, unspecified: Secondary | ICD-10-CM | POA: Diagnosis not present

## 2021-02-17 DIAGNOSIS — E876 Hypokalemia: Secondary | ICD-10-CM | POA: Diagnosis not present

## 2021-02-17 DIAGNOSIS — D631 Anemia in chronic kidney disease: Secondary | ICD-10-CM | POA: Diagnosis not present

## 2021-02-17 DIAGNOSIS — Z992 Dependence on renal dialysis: Secondary | ICD-10-CM | POA: Diagnosis not present

## 2021-02-17 DIAGNOSIS — N2581 Secondary hyperparathyroidism of renal origin: Secondary | ICD-10-CM | POA: Diagnosis not present

## 2021-02-17 DIAGNOSIS — N186 End stage renal disease: Secondary | ICD-10-CM | POA: Diagnosis not present

## 2021-02-20 DIAGNOSIS — Z992 Dependence on renal dialysis: Secondary | ICD-10-CM | POA: Diagnosis not present

## 2021-02-20 DIAGNOSIS — N186 End stage renal disease: Secondary | ICD-10-CM | POA: Diagnosis not present

## 2021-02-20 DIAGNOSIS — D509 Iron deficiency anemia, unspecified: Secondary | ICD-10-CM | POA: Diagnosis not present

## 2021-02-20 DIAGNOSIS — N2581 Secondary hyperparathyroidism of renal origin: Secondary | ICD-10-CM | POA: Diagnosis not present

## 2021-02-20 DIAGNOSIS — E876 Hypokalemia: Secondary | ICD-10-CM | POA: Diagnosis not present

## 2021-02-20 DIAGNOSIS — D631 Anemia in chronic kidney disease: Secondary | ICD-10-CM | POA: Diagnosis not present

## 2021-02-22 DIAGNOSIS — N2581 Secondary hyperparathyroidism of renal origin: Secondary | ICD-10-CM | POA: Diagnosis not present

## 2021-02-22 DIAGNOSIS — Z992 Dependence on renal dialysis: Secondary | ICD-10-CM | POA: Diagnosis not present

## 2021-02-22 DIAGNOSIS — E876 Hypokalemia: Secondary | ICD-10-CM | POA: Diagnosis not present

## 2021-02-22 DIAGNOSIS — N186 End stage renal disease: Secondary | ICD-10-CM | POA: Diagnosis not present

## 2021-02-22 DIAGNOSIS — D509 Iron deficiency anemia, unspecified: Secondary | ICD-10-CM | POA: Diagnosis not present

## 2021-02-22 DIAGNOSIS — D631 Anemia in chronic kidney disease: Secondary | ICD-10-CM | POA: Diagnosis not present

## 2021-02-23 ENCOUNTER — Encounter (HOSPITAL_COMMUNITY): Payer: Medicare Other

## 2021-02-23 ENCOUNTER — Ambulatory Visit: Payer: Medicare Other | Admitting: Vascular Surgery

## 2021-02-24 DIAGNOSIS — D509 Iron deficiency anemia, unspecified: Secondary | ICD-10-CM | POA: Diagnosis not present

## 2021-02-24 DIAGNOSIS — D631 Anemia in chronic kidney disease: Secondary | ICD-10-CM | POA: Diagnosis not present

## 2021-02-24 DIAGNOSIS — E876 Hypokalemia: Secondary | ICD-10-CM | POA: Diagnosis not present

## 2021-02-24 DIAGNOSIS — N2581 Secondary hyperparathyroidism of renal origin: Secondary | ICD-10-CM | POA: Diagnosis not present

## 2021-02-24 DIAGNOSIS — Z992 Dependence on renal dialysis: Secondary | ICD-10-CM | POA: Diagnosis not present

## 2021-02-24 DIAGNOSIS — N186 End stage renal disease: Secondary | ICD-10-CM | POA: Diagnosis not present

## 2021-02-27 DIAGNOSIS — D509 Iron deficiency anemia, unspecified: Secondary | ICD-10-CM | POA: Diagnosis not present

## 2021-02-27 DIAGNOSIS — Z992 Dependence on renal dialysis: Secondary | ICD-10-CM | POA: Diagnosis not present

## 2021-02-27 DIAGNOSIS — N186 End stage renal disease: Secondary | ICD-10-CM | POA: Diagnosis not present

## 2021-02-27 DIAGNOSIS — E876 Hypokalemia: Secondary | ICD-10-CM | POA: Diagnosis not present

## 2021-02-27 DIAGNOSIS — N2581 Secondary hyperparathyroidism of renal origin: Secondary | ICD-10-CM | POA: Diagnosis not present

## 2021-02-27 DIAGNOSIS — D631 Anemia in chronic kidney disease: Secondary | ICD-10-CM | POA: Diagnosis not present

## 2021-03-01 DIAGNOSIS — N186 End stage renal disease: Secondary | ICD-10-CM | POA: Diagnosis not present

## 2021-03-01 DIAGNOSIS — E876 Hypokalemia: Secondary | ICD-10-CM | POA: Diagnosis not present

## 2021-03-01 DIAGNOSIS — D631 Anemia in chronic kidney disease: Secondary | ICD-10-CM | POA: Diagnosis not present

## 2021-03-01 DIAGNOSIS — D509 Iron deficiency anemia, unspecified: Secondary | ICD-10-CM | POA: Diagnosis not present

## 2021-03-01 DIAGNOSIS — Z992 Dependence on renal dialysis: Secondary | ICD-10-CM | POA: Diagnosis not present

## 2021-03-01 DIAGNOSIS — N2581 Secondary hyperparathyroidism of renal origin: Secondary | ICD-10-CM | POA: Diagnosis not present

## 2021-03-02 ENCOUNTER — Other Ambulatory Visit: Payer: Self-pay

## 2021-03-02 ENCOUNTER — Ambulatory Visit (INDEPENDENT_AMBULATORY_CARE_PROVIDER_SITE_OTHER): Payer: Medicare Other | Admitting: Physician Assistant

## 2021-03-02 VITALS — BP 166/77 | HR 111 | Temp 97.9°F | Resp 18 | Ht 64.0 in | Wt 142.1 lb

## 2021-03-02 DIAGNOSIS — N186 End stage renal disease: Secondary | ICD-10-CM

## 2021-03-02 NOTE — Progress Notes (Signed)
  POST OPERATIVE OFFICE NOTE    CC:  F/u for surgery  HPI:  This is a 70 y.o. male who is s/p first stage basilic 9/70/26 followed second stage basilic transposition on 02/07/21.     Pt returns today for follow up.  Pt denise pain, loss of sensation and loss of motor in the left UE.    Dialysis days:  Tuesdays Thursdays and Saturdays   Dialysis center:  [facility]  Allergies  Allergen Reactions  . Latex Other (See Comments)    Unknown   . Methoxy Polyethylene Glycol-Epoetin Beta Other (See Comments)    Unknown    Current Outpatient Medications  Medication Sig Dispense Refill  . albuterol (VENTOLIN HFA) 108 (90 Base) MCG/ACT inhaler Inhale 1-2 puffs into the lungs every 4 (four) hours as needed for wheezing or shortness of breath.    [name] atorvastatin (LIPITOR) 20 MG tablet TAKE 1 TABLET(20 MG) BY MOUTH DAILY (Patient taking differently: Take 20 mg by mouth daily.) 90 tablet 2  . B Complex-C-Folic Acid (DIALYVITE TABLET) TABS Take 1 tablet by mouth daily.    [name] HYDROcodone-acetaminophen (NORCO/VICODIN) 5-325 MG tablet Take 1 tablet by mouth every 6 (six) hours as needed for moderate pain. 20 tablet 0  . levothyroxine (SYNTHROID) 50 MCG tablet Take 50 mcg by mouth daily before breakfast.    . pantoprazole (PROTONIX) 40 MG tablet Take 1 tablet (40 mg total) by mouth 2 (two) times daily. (Patient not taking: No sig reported) 60 tablet 2   No current facility-administered medications for this visit.     ROS:  See HPI  Physical Exam:    Incision:  Well healed Extremities:  N/M/V in tact left UE.  Good palpable thrill in the fistula Lungs: non labored breathing    Assessment/Plan:  This is a 70 y.o. male who is s/p:first stage basilic 3/78/58 followed second stage basilic transposition on 02/07/21.     The fistula may be accessed by 03/22/21.  Once the fistula is working well the [facility] can be removed.   Roxy Horseman PA-C [facility] [phone]   Clinic  MD:  Donzetta Matters

## 2021-03-03 DIAGNOSIS — D509 Iron deficiency anemia, unspecified: Secondary | ICD-10-CM | POA: Diagnosis not present

## 2021-03-03 DIAGNOSIS — N186 End stage renal disease: Secondary | ICD-10-CM | POA: Diagnosis not present

## 2021-03-03 DIAGNOSIS — Z992 Dependence on renal dialysis: Secondary | ICD-10-CM | POA: Diagnosis not present

## 2021-03-03 DIAGNOSIS — N2581 Secondary hyperparathyroidism of renal origin: Secondary | ICD-10-CM | POA: Diagnosis not present

## 2021-03-03 DIAGNOSIS — D631 Anemia in chronic kidney disease: Secondary | ICD-10-CM | POA: Diagnosis not present

## 2021-03-03 DIAGNOSIS — E876 Hypokalemia: Secondary | ICD-10-CM | POA: Diagnosis not present

## 2021-03-06 DIAGNOSIS — N186 End stage renal disease: Secondary | ICD-10-CM | POA: Diagnosis not present

## 2021-03-06 DIAGNOSIS — Z992 Dependence on renal dialysis: Secondary | ICD-10-CM | POA: Diagnosis not present

## 2021-03-06 DIAGNOSIS — D509 Iron deficiency anemia, unspecified: Secondary | ICD-10-CM | POA: Diagnosis not present

## 2021-03-06 DIAGNOSIS — E876 Hypokalemia: Secondary | ICD-10-CM | POA: Diagnosis not present

## 2021-03-06 DIAGNOSIS — D631 Anemia in chronic kidney disease: Secondary | ICD-10-CM | POA: Diagnosis not present

## 2021-03-06 DIAGNOSIS — N2581 Secondary hyperparathyroidism of renal origin: Secondary | ICD-10-CM | POA: Diagnosis not present

## 2021-03-07 DIAGNOSIS — N186 End stage renal disease: Secondary | ICD-10-CM | POA: Diagnosis not present

## 2021-03-07 DIAGNOSIS — L821 Other seborrheic keratosis: Secondary | ICD-10-CM | POA: Diagnosis not present

## 2021-03-07 DIAGNOSIS — Z992 Dependence on renal dialysis: Secondary | ICD-10-CM | POA: Diagnosis not present

## 2021-03-07 DIAGNOSIS — T8610 Unspecified complication of kidney transplant: Secondary | ICD-10-CM | POA: Diagnosis not present

## 2021-03-07 DIAGNOSIS — C44622 Squamous cell carcinoma of skin of right upper limb, including shoulder: Secondary | ICD-10-CM | POA: Diagnosis not present

## 2021-03-08 DIAGNOSIS — Z992 Dependence on renal dialysis: Secondary | ICD-10-CM | POA: Diagnosis not present

## 2021-03-08 DIAGNOSIS — N2581 Secondary hyperparathyroidism of renal origin: Secondary | ICD-10-CM | POA: Diagnosis not present

## 2021-03-08 DIAGNOSIS — E876 Hypokalemia: Secondary | ICD-10-CM | POA: Diagnosis not present

## 2021-03-08 DIAGNOSIS — N186 End stage renal disease: Secondary | ICD-10-CM | POA: Diagnosis not present

## 2021-03-08 DIAGNOSIS — D509 Iron deficiency anemia, unspecified: Secondary | ICD-10-CM | POA: Diagnosis not present

## 2021-03-08 DIAGNOSIS — D631 Anemia in chronic kidney disease: Secondary | ICD-10-CM | POA: Diagnosis not present

## 2021-03-08 DIAGNOSIS — Z23 Encounter for immunization: Secondary | ICD-10-CM | POA: Diagnosis not present

## 2021-03-10 DIAGNOSIS — E876 Hypokalemia: Secondary | ICD-10-CM | POA: Diagnosis not present

## 2021-03-10 DIAGNOSIS — N2581 Secondary hyperparathyroidism of renal origin: Secondary | ICD-10-CM | POA: Diagnosis not present

## 2021-03-10 DIAGNOSIS — Z992 Dependence on renal dialysis: Secondary | ICD-10-CM | POA: Diagnosis not present

## 2021-03-10 DIAGNOSIS — D631 Anemia in chronic kidney disease: Secondary | ICD-10-CM | POA: Diagnosis not present

## 2021-03-10 DIAGNOSIS — N186 End stage renal disease: Secondary | ICD-10-CM | POA: Diagnosis not present

## 2021-03-10 DIAGNOSIS — D509 Iron deficiency anemia, unspecified: Secondary | ICD-10-CM | POA: Diagnosis not present

## 2021-03-13 DIAGNOSIS — N2581 Secondary hyperparathyroidism of renal origin: Secondary | ICD-10-CM | POA: Diagnosis not present

## 2021-03-13 DIAGNOSIS — D631 Anemia in chronic kidney disease: Secondary | ICD-10-CM | POA: Diagnosis not present

## 2021-03-13 DIAGNOSIS — N186 End stage renal disease: Secondary | ICD-10-CM | POA: Diagnosis not present

## 2021-03-13 DIAGNOSIS — Z992 Dependence on renal dialysis: Secondary | ICD-10-CM | POA: Diagnosis not present

## 2021-03-13 DIAGNOSIS — D509 Iron deficiency anemia, unspecified: Secondary | ICD-10-CM | POA: Diagnosis not present

## 2021-03-13 DIAGNOSIS — E876 Hypokalemia: Secondary | ICD-10-CM | POA: Diagnosis not present

## 2021-03-15 DIAGNOSIS — N2581 Secondary hyperparathyroidism of renal origin: Secondary | ICD-10-CM | POA: Diagnosis not present

## 2021-03-15 DIAGNOSIS — D509 Iron deficiency anemia, unspecified: Secondary | ICD-10-CM | POA: Diagnosis not present

## 2021-03-15 DIAGNOSIS — N186 End stage renal disease: Secondary | ICD-10-CM | POA: Diagnosis not present

## 2021-03-15 DIAGNOSIS — Z992 Dependence on renal dialysis: Secondary | ICD-10-CM | POA: Diagnosis not present

## 2021-03-15 DIAGNOSIS — D631 Anemia in chronic kidney disease: Secondary | ICD-10-CM | POA: Diagnosis not present

## 2021-03-15 DIAGNOSIS — E876 Hypokalemia: Secondary | ICD-10-CM | POA: Diagnosis not present

## 2021-03-17 DIAGNOSIS — N2581 Secondary hyperparathyroidism of renal origin: Secondary | ICD-10-CM | POA: Diagnosis not present

## 2021-03-17 DIAGNOSIS — D631 Anemia in chronic kidney disease: Secondary | ICD-10-CM | POA: Diagnosis not present

## 2021-03-17 DIAGNOSIS — D509 Iron deficiency anemia, unspecified: Secondary | ICD-10-CM | POA: Diagnosis not present

## 2021-03-17 DIAGNOSIS — N186 End stage renal disease: Secondary | ICD-10-CM | POA: Diagnosis not present

## 2021-03-17 DIAGNOSIS — Z992 Dependence on renal dialysis: Secondary | ICD-10-CM | POA: Diagnosis not present

## 2021-03-17 DIAGNOSIS — E876 Hypokalemia: Secondary | ICD-10-CM | POA: Diagnosis not present

## 2021-03-20 DIAGNOSIS — N2581 Secondary hyperparathyroidism of renal origin: Secondary | ICD-10-CM | POA: Diagnosis not present

## 2021-03-20 DIAGNOSIS — N186 End stage renal disease: Secondary | ICD-10-CM | POA: Diagnosis not present

## 2021-03-20 DIAGNOSIS — D509 Iron deficiency anemia, unspecified: Secondary | ICD-10-CM | POA: Diagnosis not present

## 2021-03-20 DIAGNOSIS — D631 Anemia in chronic kidney disease: Secondary | ICD-10-CM | POA: Diagnosis not present

## 2021-03-20 DIAGNOSIS — Z992 Dependence on renal dialysis: Secondary | ICD-10-CM | POA: Diagnosis not present

## 2021-03-20 DIAGNOSIS — E876 Hypokalemia: Secondary | ICD-10-CM | POA: Diagnosis not present

## 2021-03-22 DIAGNOSIS — N2581 Secondary hyperparathyroidism of renal origin: Secondary | ICD-10-CM | POA: Diagnosis not present

## 2021-03-22 DIAGNOSIS — E876 Hypokalemia: Secondary | ICD-10-CM | POA: Diagnosis not present

## 2021-03-22 DIAGNOSIS — Z992 Dependence on renal dialysis: Secondary | ICD-10-CM | POA: Diagnosis not present

## 2021-03-22 DIAGNOSIS — D631 Anemia in chronic kidney disease: Secondary | ICD-10-CM | POA: Diagnosis not present

## 2021-03-22 DIAGNOSIS — N186 End stage renal disease: Secondary | ICD-10-CM | POA: Diagnosis not present

## 2021-03-22 DIAGNOSIS — D509 Iron deficiency anemia, unspecified: Secondary | ICD-10-CM | POA: Diagnosis not present

## 2021-03-24 DIAGNOSIS — Z992 Dependence on renal dialysis: Secondary | ICD-10-CM | POA: Diagnosis not present

## 2021-03-24 DIAGNOSIS — E876 Hypokalemia: Secondary | ICD-10-CM | POA: Diagnosis not present

## 2021-03-24 DIAGNOSIS — D509 Iron deficiency anemia, unspecified: Secondary | ICD-10-CM | POA: Diagnosis not present

## 2021-03-24 DIAGNOSIS — N2581 Secondary hyperparathyroidism of renal origin: Secondary | ICD-10-CM | POA: Diagnosis not present

## 2021-03-24 DIAGNOSIS — D631 Anemia in chronic kidney disease: Secondary | ICD-10-CM | POA: Diagnosis not present

## 2021-03-24 DIAGNOSIS — N186 End stage renal disease: Secondary | ICD-10-CM | POA: Diagnosis not present

## 2021-03-26 DIAGNOSIS — H35373 Puckering of macula, bilateral: Secondary | ICD-10-CM | POA: Diagnosis not present

## 2021-03-26 DIAGNOSIS — H353131 Nonexudative age-related macular degeneration, bilateral, early dry stage: Secondary | ICD-10-CM | POA: Diagnosis not present

## 2021-03-26 DIAGNOSIS — H5212 Myopia, left eye: Secondary | ICD-10-CM | POA: Diagnosis not present

## 2021-03-26 DIAGNOSIS — H3562 Retinal hemorrhage, left eye: Secondary | ICD-10-CM | POA: Diagnosis not present

## 2021-03-26 DIAGNOSIS — Z961 Presence of intraocular lens: Secondary | ICD-10-CM | POA: Diagnosis not present

## 2021-03-26 DIAGNOSIS — Z9842 Cataract extraction status, left eye: Secondary | ICD-10-CM | POA: Diagnosis not present

## 2021-03-26 DIAGNOSIS — Z9841 Cataract extraction status, right eye: Secondary | ICD-10-CM | POA: Diagnosis not present

## 2021-03-26 DIAGNOSIS — H52223 Regular astigmatism, bilateral: Secondary | ICD-10-CM | POA: Diagnosis not present

## 2021-03-26 DIAGNOSIS — H5201 Hypermetropia, right eye: Secondary | ICD-10-CM | POA: Diagnosis not present

## 2021-03-26 DIAGNOSIS — H524 Presbyopia: Secondary | ICD-10-CM | POA: Diagnosis not present

## 2021-03-27 DIAGNOSIS — E876 Hypokalemia: Secondary | ICD-10-CM | POA: Diagnosis not present

## 2021-03-27 DIAGNOSIS — Z992 Dependence on renal dialysis: Secondary | ICD-10-CM | POA: Diagnosis not present

## 2021-03-27 DIAGNOSIS — D631 Anemia in chronic kidney disease: Secondary | ICD-10-CM | POA: Diagnosis not present

## 2021-03-27 DIAGNOSIS — N186 End stage renal disease: Secondary | ICD-10-CM | POA: Diagnosis not present

## 2021-03-27 DIAGNOSIS — N2581 Secondary hyperparathyroidism of renal origin: Secondary | ICD-10-CM | POA: Diagnosis not present

## 2021-03-27 DIAGNOSIS — D509 Iron deficiency anemia, unspecified: Secondary | ICD-10-CM | POA: Diagnosis not present

## 2021-03-29 DIAGNOSIS — E876 Hypokalemia: Secondary | ICD-10-CM | POA: Diagnosis not present

## 2021-03-29 DIAGNOSIS — N2581 Secondary hyperparathyroidism of renal origin: Secondary | ICD-10-CM | POA: Diagnosis not present

## 2021-03-29 DIAGNOSIS — N186 End stage renal disease: Secondary | ICD-10-CM | POA: Diagnosis not present

## 2021-03-29 DIAGNOSIS — D631 Anemia in chronic kidney disease: Secondary | ICD-10-CM | POA: Diagnosis not present

## 2021-03-29 DIAGNOSIS — Z992 Dependence on renal dialysis: Secondary | ICD-10-CM | POA: Diagnosis not present

## 2021-03-29 DIAGNOSIS — D509 Iron deficiency anemia, unspecified: Secondary | ICD-10-CM | POA: Diagnosis not present

## 2021-03-31 DIAGNOSIS — N186 End stage renal disease: Secondary | ICD-10-CM | POA: Diagnosis not present

## 2021-03-31 DIAGNOSIS — E876 Hypokalemia: Secondary | ICD-10-CM | POA: Diagnosis not present

## 2021-03-31 DIAGNOSIS — Z992 Dependence on renal dialysis: Secondary | ICD-10-CM | POA: Diagnosis not present

## 2021-03-31 DIAGNOSIS — N2581 Secondary hyperparathyroidism of renal origin: Secondary | ICD-10-CM | POA: Diagnosis not present

## 2021-03-31 DIAGNOSIS — D631 Anemia in chronic kidney disease: Secondary | ICD-10-CM | POA: Diagnosis not present

## 2021-03-31 DIAGNOSIS — D509 Iron deficiency anemia, unspecified: Secondary | ICD-10-CM | POA: Diagnosis not present

## 2021-04-02 ENCOUNTER — Ambulatory Visit: Payer: Medicare Other | Admitting: Legal Medicine

## 2021-04-03 DIAGNOSIS — E876 Hypokalemia: Secondary | ICD-10-CM | POA: Diagnosis not present

## 2021-04-03 DIAGNOSIS — N2581 Secondary hyperparathyroidism of renal origin: Secondary | ICD-10-CM | POA: Diagnosis not present

## 2021-04-03 DIAGNOSIS — N186 End stage renal disease: Secondary | ICD-10-CM | POA: Diagnosis not present

## 2021-04-03 DIAGNOSIS — D631 Anemia in chronic kidney disease: Secondary | ICD-10-CM | POA: Diagnosis not present

## 2021-04-03 DIAGNOSIS — D509 Iron deficiency anemia, unspecified: Secondary | ICD-10-CM | POA: Diagnosis not present

## 2021-04-03 DIAGNOSIS — Z992 Dependence on renal dialysis: Secondary | ICD-10-CM | POA: Diagnosis not present

## 2021-04-05 DIAGNOSIS — E876 Hypokalemia: Secondary | ICD-10-CM | POA: Diagnosis not present

## 2021-04-05 DIAGNOSIS — D509 Iron deficiency anemia, unspecified: Secondary | ICD-10-CM | POA: Diagnosis not present

## 2021-04-05 DIAGNOSIS — Z992 Dependence on renal dialysis: Secondary | ICD-10-CM | POA: Diagnosis not present

## 2021-04-05 DIAGNOSIS — N2581 Secondary hyperparathyroidism of renal origin: Secondary | ICD-10-CM | POA: Diagnosis not present

## 2021-04-05 DIAGNOSIS — D631 Anemia in chronic kidney disease: Secondary | ICD-10-CM | POA: Diagnosis not present

## 2021-04-05 DIAGNOSIS — N186 End stage renal disease: Secondary | ICD-10-CM | POA: Diagnosis not present

## 2021-04-06 DIAGNOSIS — Z992 Dependence on renal dialysis: Secondary | ICD-10-CM | POA: Diagnosis not present

## 2021-04-06 DIAGNOSIS — N186 End stage renal disease: Secondary | ICD-10-CM | POA: Diagnosis not present

## 2021-04-06 DIAGNOSIS — T8610 Unspecified complication of kidney transplant: Secondary | ICD-10-CM | POA: Diagnosis not present

## 2021-04-07 DIAGNOSIS — D631 Anemia in chronic kidney disease: Secondary | ICD-10-CM | POA: Diagnosis not present

## 2021-04-07 DIAGNOSIS — N2581 Secondary hyperparathyroidism of renal origin: Secondary | ICD-10-CM | POA: Diagnosis not present

## 2021-04-07 DIAGNOSIS — N186 End stage renal disease: Secondary | ICD-10-CM | POA: Diagnosis not present

## 2021-04-07 DIAGNOSIS — D509 Iron deficiency anemia, unspecified: Secondary | ICD-10-CM | POA: Diagnosis not present

## 2021-04-07 DIAGNOSIS — R6883 Chills (without fever): Secondary | ICD-10-CM | POA: Diagnosis not present

## 2021-04-07 DIAGNOSIS — Z992 Dependence on renal dialysis: Secondary | ICD-10-CM | POA: Diagnosis not present

## 2021-04-07 DIAGNOSIS — E876 Hypokalemia: Secondary | ICD-10-CM | POA: Diagnosis not present

## 2021-04-10 DIAGNOSIS — L570 Actinic keratosis: Secondary | ICD-10-CM | POA: Diagnosis present

## 2021-04-10 DIAGNOSIS — I129 Hypertensive chronic kidney disease with stage 1 through stage 4 chronic kidney disease, or unspecified chronic kidney disease: Secondary | ICD-10-CM | POA: Diagnosis not present

## 2021-04-10 DIAGNOSIS — Z794 Long term (current) use of insulin: Secondary | ICD-10-CM | POA: Diagnosis not present

## 2021-04-10 DIAGNOSIS — N2581 Secondary hyperparathyroidism of renal origin: Secondary | ICD-10-CM | POA: Diagnosis not present

## 2021-04-10 DIAGNOSIS — L821 Other seborrheic keratosis: Secondary | ICD-10-CM | POA: Diagnosis present

## 2021-04-10 DIAGNOSIS — N186 End stage renal disease: Secondary | ICD-10-CM | POA: Diagnosis present

## 2021-04-10 DIAGNOSIS — L270 Generalized skin eruption due to drugs and medicaments taken internally: Secondary | ICD-10-CM | POA: Diagnosis not present

## 2021-04-10 DIAGNOSIS — E785 Hyperlipidemia, unspecified: Secondary | ICD-10-CM | POA: Diagnosis present

## 2021-04-10 DIAGNOSIS — Z79890 Hormone replacement therapy (postmenopausal): Secondary | ICD-10-CM | POA: Diagnosis not present

## 2021-04-10 DIAGNOSIS — E1122 Type 2 diabetes mellitus with diabetic chronic kidney disease: Secondary | ICD-10-CM | POA: Diagnosis present

## 2021-04-10 DIAGNOSIS — Z20822 Contact with and (suspected) exposure to covid-19: Secondary | ICD-10-CM | POA: Diagnosis present

## 2021-04-10 DIAGNOSIS — Z66 Do not resuscitate: Secondary | ICD-10-CM | POA: Diagnosis not present

## 2021-04-10 DIAGNOSIS — Z992 Dependence on renal dialysis: Secondary | ICD-10-CM | POA: Diagnosis not present

## 2021-04-10 DIAGNOSIS — E876 Hypokalemia: Secondary | ICD-10-CM | POA: Diagnosis not present

## 2021-04-10 DIAGNOSIS — D509 Iron deficiency anemia, unspecified: Secondary | ICD-10-CM | POA: Diagnosis not present

## 2021-04-10 DIAGNOSIS — Z79899 Other long term (current) drug therapy: Secondary | ICD-10-CM | POA: Diagnosis not present

## 2021-04-10 DIAGNOSIS — I2510 Atherosclerotic heart disease of native coronary artery without angina pectoris: Secondary | ICD-10-CM | POA: Diagnosis present

## 2021-04-10 DIAGNOSIS — R509 Fever, unspecified: Secondary | ICD-10-CM | POA: Diagnosis not present

## 2021-04-10 DIAGNOSIS — R6883 Chills (without fever): Secondary | ICD-10-CM | POA: Diagnosis not present

## 2021-04-10 DIAGNOSIS — Z923 Personal history of irradiation: Secondary | ICD-10-CM | POA: Diagnosis not present

## 2021-04-10 DIAGNOSIS — I517 Cardiomegaly: Secondary | ICD-10-CM | POA: Diagnosis not present

## 2021-04-10 DIAGNOSIS — I1311 Hypertensive heart and chronic kidney disease without heart failure, with stage 5 chronic kidney disease, or end stage renal disease: Secondary | ICD-10-CM | POA: Diagnosis present

## 2021-04-10 DIAGNOSIS — Z85118 Personal history of other malignant neoplasm of bronchus and lung: Secondary | ICD-10-CM | POA: Diagnosis not present

## 2021-04-10 DIAGNOSIS — Z87891 Personal history of nicotine dependence: Secondary | ICD-10-CM | POA: Diagnosis not present

## 2021-04-10 DIAGNOSIS — D631 Anemia in chronic kidney disease: Secondary | ICD-10-CM | POA: Diagnosis present

## 2021-04-10 DIAGNOSIS — R000 Tachycardia, unspecified: Secondary | ICD-10-CM | POA: Diagnosis not present

## 2021-04-10 DIAGNOSIS — C3490 Malignant neoplasm of unspecified part of unspecified bronchus or lung: Secondary | ICD-10-CM | POA: Diagnosis not present

## 2021-04-10 DIAGNOSIS — E039 Hypothyroidism, unspecified: Secondary | ICD-10-CM | POA: Diagnosis present

## 2021-04-10 DIAGNOSIS — Z951 Presence of aortocoronary bypass graft: Secondary | ICD-10-CM | POA: Diagnosis not present

## 2021-04-10 DIAGNOSIS — J449 Chronic obstructive pulmonary disease, unspecified: Secondary | ICD-10-CM | POA: Diagnosis present

## 2021-04-10 DIAGNOSIS — J9811 Atelectasis: Secondary | ICD-10-CM | POA: Diagnosis not present

## 2021-04-10 DIAGNOSIS — I120 Hypertensive chronic kidney disease with stage 5 chronic kidney disease or end stage renal disease: Secondary | ICD-10-CM | POA: Diagnosis not present

## 2021-04-10 DIAGNOSIS — R0902 Hypoxemia: Secondary | ICD-10-CM | POA: Diagnosis not present

## 2021-04-10 DIAGNOSIS — T368X5A Adverse effect of other systemic antibiotics, initial encounter: Secondary | ICD-10-CM | POA: Diagnosis present

## 2021-04-10 DIAGNOSIS — M8580 Other specified disorders of bone density and structure, unspecified site: Secondary | ICD-10-CM | POA: Diagnosis not present

## 2021-04-10 DIAGNOSIS — B1920 Unspecified viral hepatitis C without hepatic coma: Secondary | ICD-10-CM | POA: Diagnosis present

## 2021-04-10 DIAGNOSIS — T8612 Kidney transplant failure: Secondary | ICD-10-CM | POA: Diagnosis present

## 2021-04-14 DIAGNOSIS — N2581 Secondary hyperparathyroidism of renal origin: Secondary | ICD-10-CM | POA: Diagnosis not present

## 2021-04-14 DIAGNOSIS — E876 Hypokalemia: Secondary | ICD-10-CM | POA: Diagnosis not present

## 2021-04-14 DIAGNOSIS — N186 End stage renal disease: Secondary | ICD-10-CM | POA: Diagnosis not present

## 2021-04-14 DIAGNOSIS — D509 Iron deficiency anemia, unspecified: Secondary | ICD-10-CM | POA: Diagnosis not present

## 2021-04-14 DIAGNOSIS — R6883 Chills (without fever): Secondary | ICD-10-CM | POA: Diagnosis not present

## 2021-04-14 DIAGNOSIS — Z992 Dependence on renal dialysis: Secondary | ICD-10-CM | POA: Diagnosis not present

## 2021-04-17 DIAGNOSIS — R6883 Chills (without fever): Secondary | ICD-10-CM | POA: Diagnosis not present

## 2021-04-17 DIAGNOSIS — N186 End stage renal disease: Secondary | ICD-10-CM | POA: Diagnosis not present

## 2021-04-17 DIAGNOSIS — N2581 Secondary hyperparathyroidism of renal origin: Secondary | ICD-10-CM | POA: Diagnosis not present

## 2021-04-17 DIAGNOSIS — Z992 Dependence on renal dialysis: Secondary | ICD-10-CM | POA: Diagnosis not present

## 2021-04-17 DIAGNOSIS — E876 Hypokalemia: Secondary | ICD-10-CM | POA: Diagnosis not present

## 2021-04-17 DIAGNOSIS — D509 Iron deficiency anemia, unspecified: Secondary | ICD-10-CM | POA: Diagnosis not present

## 2021-04-18 DIAGNOSIS — Z452 Encounter for adjustment and management of vascular access device: Secondary | ICD-10-CM | POA: Diagnosis not present

## 2021-04-18 DIAGNOSIS — Z992 Dependence on renal dialysis: Secondary | ICD-10-CM | POA: Diagnosis not present

## 2021-04-18 DIAGNOSIS — N186 End stage renal disease: Secondary | ICD-10-CM | POA: Diagnosis not present

## 2021-04-19 DIAGNOSIS — N2581 Secondary hyperparathyroidism of renal origin: Secondary | ICD-10-CM | POA: Diagnosis not present

## 2021-04-19 DIAGNOSIS — N186 End stage renal disease: Secondary | ICD-10-CM | POA: Diagnosis not present

## 2021-04-19 DIAGNOSIS — D509 Iron deficiency anemia, unspecified: Secondary | ICD-10-CM | POA: Diagnosis not present

## 2021-04-19 DIAGNOSIS — Z992 Dependence on renal dialysis: Secondary | ICD-10-CM | POA: Diagnosis not present

## 2021-04-19 DIAGNOSIS — E1122 Type 2 diabetes mellitus with diabetic chronic kidney disease: Secondary | ICD-10-CM | POA: Diagnosis not present

## 2021-04-19 DIAGNOSIS — E876 Hypokalemia: Secondary | ICD-10-CM | POA: Diagnosis not present

## 2021-04-19 DIAGNOSIS — R6883 Chills (without fever): Secondary | ICD-10-CM | POA: Diagnosis not present

## 2021-04-21 DIAGNOSIS — E876 Hypokalemia: Secondary | ICD-10-CM | POA: Diagnosis not present

## 2021-04-21 DIAGNOSIS — N186 End stage renal disease: Secondary | ICD-10-CM | POA: Diagnosis not present

## 2021-04-21 DIAGNOSIS — Z992 Dependence on renal dialysis: Secondary | ICD-10-CM | POA: Diagnosis not present

## 2021-04-21 DIAGNOSIS — N2581 Secondary hyperparathyroidism of renal origin: Secondary | ICD-10-CM | POA: Diagnosis not present

## 2021-04-21 DIAGNOSIS — D509 Iron deficiency anemia, unspecified: Secondary | ICD-10-CM | POA: Diagnosis not present

## 2021-04-21 DIAGNOSIS — R6883 Chills (without fever): Secondary | ICD-10-CM | POA: Diagnosis not present

## 2021-04-24 DIAGNOSIS — N2581 Secondary hyperparathyroidism of renal origin: Secondary | ICD-10-CM | POA: Diagnosis not present

## 2021-04-24 DIAGNOSIS — E876 Hypokalemia: Secondary | ICD-10-CM | POA: Diagnosis not present

## 2021-04-24 DIAGNOSIS — D509 Iron deficiency anemia, unspecified: Secondary | ICD-10-CM | POA: Diagnosis not present

## 2021-04-24 DIAGNOSIS — R6883 Chills (without fever): Secondary | ICD-10-CM | POA: Diagnosis not present

## 2021-04-24 DIAGNOSIS — Z992 Dependence on renal dialysis: Secondary | ICD-10-CM | POA: Diagnosis not present

## 2021-04-24 DIAGNOSIS — N186 End stage renal disease: Secondary | ICD-10-CM | POA: Diagnosis not present

## 2021-04-26 DIAGNOSIS — Z992 Dependence on renal dialysis: Secondary | ICD-10-CM | POA: Diagnosis not present

## 2021-04-26 DIAGNOSIS — R6883 Chills (without fever): Secondary | ICD-10-CM | POA: Diagnosis not present

## 2021-04-26 DIAGNOSIS — N186 End stage renal disease: Secondary | ICD-10-CM | POA: Diagnosis not present

## 2021-04-26 DIAGNOSIS — N2581 Secondary hyperparathyroidism of renal origin: Secondary | ICD-10-CM | POA: Diagnosis not present

## 2021-04-26 DIAGNOSIS — E876 Hypokalemia: Secondary | ICD-10-CM | POA: Diagnosis not present

## 2021-04-26 DIAGNOSIS — D509 Iron deficiency anemia, unspecified: Secondary | ICD-10-CM | POA: Diagnosis not present

## 2021-04-28 DIAGNOSIS — E876 Hypokalemia: Secondary | ICD-10-CM | POA: Diagnosis not present

## 2021-04-28 DIAGNOSIS — N2581 Secondary hyperparathyroidism of renal origin: Secondary | ICD-10-CM | POA: Diagnosis not present

## 2021-04-28 DIAGNOSIS — R6883 Chills (without fever): Secondary | ICD-10-CM | POA: Diagnosis not present

## 2021-04-28 DIAGNOSIS — N186 End stage renal disease: Secondary | ICD-10-CM | POA: Diagnosis not present

## 2021-04-28 DIAGNOSIS — D509 Iron deficiency anemia, unspecified: Secondary | ICD-10-CM | POA: Diagnosis not present

## 2021-04-28 DIAGNOSIS — Z992 Dependence on renal dialysis: Secondary | ICD-10-CM | POA: Diagnosis not present

## 2021-05-01 DIAGNOSIS — Z992 Dependence on renal dialysis: Secondary | ICD-10-CM | POA: Diagnosis not present

## 2021-05-01 DIAGNOSIS — R6883 Chills (without fever): Secondary | ICD-10-CM | POA: Diagnosis not present

## 2021-05-01 DIAGNOSIS — N186 End stage renal disease: Secondary | ICD-10-CM | POA: Diagnosis not present

## 2021-05-01 DIAGNOSIS — N2581 Secondary hyperparathyroidism of renal origin: Secondary | ICD-10-CM | POA: Diagnosis not present

## 2021-05-01 DIAGNOSIS — D509 Iron deficiency anemia, unspecified: Secondary | ICD-10-CM | POA: Diagnosis not present

## 2021-05-01 DIAGNOSIS — E876 Hypokalemia: Secondary | ICD-10-CM | POA: Diagnosis not present

## 2021-05-03 DIAGNOSIS — E876 Hypokalemia: Secondary | ICD-10-CM | POA: Diagnosis not present

## 2021-05-03 DIAGNOSIS — N2581 Secondary hyperparathyroidism of renal origin: Secondary | ICD-10-CM | POA: Diagnosis not present

## 2021-05-03 DIAGNOSIS — D509 Iron deficiency anemia, unspecified: Secondary | ICD-10-CM | POA: Diagnosis not present

## 2021-05-03 DIAGNOSIS — N186 End stage renal disease: Secondary | ICD-10-CM | POA: Diagnosis not present

## 2021-05-03 DIAGNOSIS — Z992 Dependence on renal dialysis: Secondary | ICD-10-CM | POA: Diagnosis not present

## 2021-05-03 DIAGNOSIS — R6883 Chills (without fever): Secondary | ICD-10-CM | POA: Diagnosis not present

## 2021-05-05 DIAGNOSIS — R6883 Chills (without fever): Secondary | ICD-10-CM | POA: Diagnosis not present

## 2021-05-05 DIAGNOSIS — N2581 Secondary hyperparathyroidism of renal origin: Secondary | ICD-10-CM | POA: Diagnosis not present

## 2021-05-05 DIAGNOSIS — Z992 Dependence on renal dialysis: Secondary | ICD-10-CM | POA: Diagnosis not present

## 2021-05-05 DIAGNOSIS — E876 Hypokalemia: Secondary | ICD-10-CM | POA: Diagnosis not present

## 2021-05-05 DIAGNOSIS — D509 Iron deficiency anemia, unspecified: Secondary | ICD-10-CM | POA: Diagnosis not present

## 2021-05-05 DIAGNOSIS — N186 End stage renal disease: Secondary | ICD-10-CM | POA: Diagnosis not present

## 2021-05-07 DIAGNOSIS — N186 End stage renal disease: Secondary | ICD-10-CM | POA: Diagnosis not present

## 2021-05-07 DIAGNOSIS — Z992 Dependence on renal dialysis: Secondary | ICD-10-CM | POA: Diagnosis not present

## 2021-05-07 DIAGNOSIS — T8610 Unspecified complication of kidney transplant: Secondary | ICD-10-CM | POA: Diagnosis not present

## 2021-05-08 DIAGNOSIS — Z23 Encounter for immunization: Secondary | ICD-10-CM | POA: Diagnosis not present

## 2021-05-08 DIAGNOSIS — E876 Hypokalemia: Secondary | ICD-10-CM | POA: Diagnosis not present

## 2021-05-08 DIAGNOSIS — D509 Iron deficiency anemia, unspecified: Secondary | ICD-10-CM | POA: Diagnosis not present

## 2021-05-08 DIAGNOSIS — N186 End stage renal disease: Secondary | ICD-10-CM | POA: Diagnosis not present

## 2021-05-08 DIAGNOSIS — Z992 Dependence on renal dialysis: Secondary | ICD-10-CM | POA: Diagnosis not present

## 2021-05-08 DIAGNOSIS — N2581 Secondary hyperparathyroidism of renal origin: Secondary | ICD-10-CM | POA: Diagnosis not present

## 2021-05-08 DIAGNOSIS — E1122 Type 2 diabetes mellitus with diabetic chronic kidney disease: Secondary | ICD-10-CM | POA: Diagnosis not present

## 2021-05-08 DIAGNOSIS — D631 Anemia in chronic kidney disease: Secondary | ICD-10-CM | POA: Diagnosis not present

## 2021-05-10 DIAGNOSIS — D509 Iron deficiency anemia, unspecified: Secondary | ICD-10-CM | POA: Diagnosis not present

## 2021-05-10 DIAGNOSIS — N2581 Secondary hyperparathyroidism of renal origin: Secondary | ICD-10-CM | POA: Diagnosis not present

## 2021-05-10 DIAGNOSIS — Z992 Dependence on renal dialysis: Secondary | ICD-10-CM | POA: Diagnosis not present

## 2021-05-10 DIAGNOSIS — E1122 Type 2 diabetes mellitus with diabetic chronic kidney disease: Secondary | ICD-10-CM | POA: Diagnosis not present

## 2021-05-10 DIAGNOSIS — N186 End stage renal disease: Secondary | ICD-10-CM | POA: Diagnosis not present

## 2021-05-10 DIAGNOSIS — E876 Hypokalemia: Secondary | ICD-10-CM | POA: Diagnosis not present

## 2021-05-10 NOTE — Progress Notes (Signed)
[name]  [address] [place],  [place]  [address] [phone]  Clinic Day:  05/18/2021  Referring physician: Myrlene Broker, MD   This document serves as a record of services personally performed by Hosie Poisson, MD. It was created on their behalf by [facility], a trained medical scribe. The creation of this record is based on the scribe's personal observations and the provider's statements to them.  CHIEF COMPLAINT:  CC: Stage I lung malignancy   Current Treatment:  Surveillance   HISTORY OF PRESENT ILLNESS:  Victor Schultz is a 70 y.o. male with PET diagnosed stage I lung malignancy.  This was found when the patient had a fall in his home and presented to the [facility] in November 2021 due to right shoulder and right chest wall pain.  Imaging revealed right 8, 9 and 10 rib fractures.  However, CT scan showed an incidental finding of a 2.0 x 1.4 cm macrolobulated nodule in the anterior aspect of the left lower lobe abutting the left hemidiaphragm.  PET imaging was pursued on December 3rd confirming the lobulated left lower lobe pulmonary nodule measuring 1.8 x 1.7 cm to be hypermetabolic with an SUV max of 4.4, suspicious for possible malignancy.  There was also intense hypermetabolic activity associated with the superior aspect of the left ear associated with soft tissue thickening on the CT, which is suspicious for neoplasm.  No other evidence of malignancy was observed.  We did not pursue pathologic confirmation in view of his comorbidities.  He would be a high risk for surgery or invasive procedures.  He was referred for stereotactic radiation, completed in February 2022.  INTERVAL HISTORY:  Victor Schultz is here for routine follow up and states that he is fair.  He continues hemodialysis Tuesdays, Thursdays and Saturdays.  He reports fatigue, shortness of breath, worse with exertion, and weakness, especially on the days of  his dialysis.   He has not had a CT scan since January.  His hemoglobin has decreased from 11.3 to 10.9, and his white count and platelets are normal.  Chemistries are unremarkable except for a creatinine of 6.7.  His  appetite is good, and he has lost 3 pounds since his last visit.  He denies fever, chills or other signs of infection.  He denies nausea, vomiting, bowel issues, or abdominal pain.  He denies sore throat, cough or chest pain.  REVIEW OF SYSTEMS:  Review of Systems  Constitutional:  Positive for fatigue. Negative for appetite change, chills, fever and unexpected weight change.  HENT:  Negative.    Eyes: Negative.   Respiratory:  Positive for shortness of breath (worse with exertion). Negative for chest tightness, cough, hemoptysis and wheezing.   Cardiovascular: Negative.  Negative for chest pain, leg swelling and palpitations.  Gastrointestinal: Negative.  Negative for abdominal distention, abdominal pain, blood in stool, constipation, diarrhea, nausea and vomiting.  Endocrine: Negative.   Genitourinary: Negative.  Negative for difficulty urinating, dysuria, frequency and hematuria.   Musculoskeletal:  Negative for arthralgias, back pain, flank pain, gait problem and myalgias.  Skin: Negative.   Neurological:  Positive for extremity weakness. Negative for dizziness, gait problem, headaches, light-headedness, numbness, seizures and speech difficulty.  Hematological: Negative.   Psychiatric/Behavioral: Negative.  Negative for depression and sleep disturbance. The patient is not nervous/anxious.   All other systems reviewed and are negative.   VITALS:  Blood pressure (!) 115/56, pulse (!) 103, temperature 97.9 F (36.6  C), temperature source Oral, resp. rate 18, height 5\' 4"  (1.626 m), weight 142 lb 6.4 oz (64.6 kg), SpO2 94 %.  Wt Readings from Last 3 Encounters:  05/18/21 142 lb 6.4 oz (64.6 kg)  03/02/21 142 lb 1.6 oz (64.5 kg)  02/07/21 142 lb (64.4 kg)    Body mass index  is 24.44 kg/m.  Performance status (ECOG): 1 - Symptomatic but completely ambulatory  PHYSICAL EXAM:  Physical Exam Constitutional:      General: He is not in acute distress.    Appearance: Normal appearance. He is normal weight.  HENT:     Head: Normocephalic and atraumatic.  Eyes:     General: No scleral icterus.    Extraocular Movements: Extraocular movements intact.     Conjunctiva/sclera: Conjunctivae normal.     Pupils: Pupils are equal, round, and reactive to light.  Cardiovascular:     Rate and Rhythm: Regular rhythm. Tachycardia present.     Pulses: Normal pulses.     Heart sounds: Normal heart sounds. No murmur heard.   No friction rub. No gallop.  Pulmonary:     Effort: Pulmonary effort is normal. No respiratory distress.     Breath sounds: Normal breath sounds.  Abdominal:     General: Bowel sounds are normal. There is no distension.     Palpations: Abdomen is soft. There is no hepatomegaly, splenomegaly or mass.     Tenderness: There is no abdominal tenderness.  Musculoskeletal:        General: Normal range of motion.     Cervical back: Normal range of motion and neck supple.     Right lower leg: No edema.     Left lower leg: No edema.  Lymphadenopathy:     Cervical: No cervical adenopathy.  Skin:    General: Skin is warm and dry.  Neurological:     General: No focal deficit present.     Mental Status: He is alert and oriented to person, place, and time. Mental status is at baseline.  Psychiatric:        Mood and Affect: Mood normal.        Behavior: Behavior normal.        Thought Content: Thought content normal.        Judgment: Judgment normal.   LABS:   CBC Latest Ref Rng & Units 05/18/2021 02/07/2021 01/15/2021  WBC - 8.6 - 5.4  Hemoglobin 13.5 - 17.5 10.9(A) 9.9(L) 11.3(A)  Hematocrit 41 - 53 32(A) 29.0(L) 34(A)  Platelets 150 - 399 224 - 156   CMP Latest Ref Rng & Units 02/07/2021 01/15/2021 12/27/2020  Glucose 70 - 99 mg/dL 116(H) - 181(H)  BUN 8  - 23 mg/dL 24(H) 30(A) 29(H)  Creatinine 0.61 - 1.24 mg/dL 4.50(H) 5.1(A) 3.74(H)  Sodium 135 - 145 mmol/L 138 138 138  Potassium 3.5 - 5.1 mmol/L 3.9 4.1 3.9  Chloride 98 - 111 mmol/L 100 105 98  CO2 13 - 22 - 22 23  Calcium 8.7 - 10.7 - 9.1 8.7  Total Protein 6.3 - 8.2 g/dL - 8.3(A) 7.3  Total Bilirubin 0.0 - 1.2 mg/dL - - 0.6  Alkaline Phos 25 - 125 - 150(A) 139(H)  AST 14 - 40 - 24 48(H)  ALT 10 - 40 - 48(A) 30     Lab Results  Component Value Date   CEA1 4.6 09/22/2020   /  CEA  Date Value Ref Range Status  09/22/2020 4.6 0.0 - 4.7 ng/mL Final  Comment:    (NOTE)                             Nonsmokers          <3.9                             Smokers             <5.6 [employer] Electrochemiluminescence Immunoassay (ECLIA) Values obtained with different assay methods or kits cannot be used interchangeably.  Results cannot be interpreted as absolute evidence of the presence or absence of malignant disease. Performed At: [facility] [place], [place] [id number] Rush Farmer MD YI:[id number]      STUDIES:  No results found.     HISTORY:   Allergies:  Allergies  Allergen Reactions   Vancomycin Itching    Other reaction(s): Breathing Problems, Flushing (Red Skin)   Latex Other (See Comments)    Unknown    Methoxy Polyethylene Glycol-Epoetin Beta Other (See Comments)    Unknown    Current Medications: Current Outpatient Medications  Medication Sig Dispense Refill   B Complex-C-Folic Acid (DIALYVITE TABLET) TABS Take 1 tablet by mouth daily.     levothyroxine (SYNTHROID) 50 MCG tablet Take 50 mcg by mouth daily before breakfast.     albuterol (VENTOLIN HFA) 108 (90 Base) MCG/ACT inhaler Inhale 1-2 puffs into the lungs every 4 (four) hours as needed for wheezing or shortness of breath.     atorvastatin (LIPITOR) 20 MG tablet TAKE 1 TABLET(20 MG) BY MOUTH DAILY (Patient taking differently: Take 20 mg by mouth daily.) 90  tablet 2   lidocaine-prilocaine (EMLA) cream APPLY SMALL AMOUNT TO ACCESS SITE (AVF) 1 TO 2 HOURS BEFORE DIALYSIS. COVER WITH OCCLUSIVE DRESSING (SARAN WRAP)     tamsulosin (FLOMAX) 0.4 MG CAPS capsule Take 0.4 mg by mouth daily.     Vitamin D, Ergocalciferol, (DRISDOL) 1.25 MG (50000 UNIT) CAPS capsule Take 50,000 Units by mouth once a week.     No current facility-administered medications for this visit.     ASSESSMENT & PLAN:   Assessment:   1.  Lobulated left lower lobe pulmonary nodule measuring 1.8-2.0 cm.  This was confirmed hypermerabolic on PET imaging. He underwent SBRT, completed in February.   2. Skin cancers. He has had multiple lesions removed. He will continue to follow with Dermatology.  3. End stage renal disease and history of kidney transplants, in the 70s and 80s.  He is currently on hemodialysis Tuesdays, Thursdays and Saturdays.   4. Anemia, which is likely anemia of chronic disease related to his ESRD.    5. Hyperproteinemia, his total protein has increased from 8.3 to 8.9.  I will order an SPEP to evaluate.  Plan: He will continue with hemodialysis. As his last imaging was in January, we will schedule him for CT chest without contrast now and we will call him with the results.  We will see him back in clinic in 6 months with repeat CBC, CMP and evaluation.  He verbalizes understanding of and agreement to the plans discussed today. He knows to call the office should any new questions or concerns arise.   Derwood Kaplan, MD [facility] AT [facility] [address] [place] [address] Dept: [phone] Dept Fax: [phone]    I, Rita Ohara, am acting as  scribe for Derwood Kaplan, MD  I have reviewed this report as typed by the medical scribe, and it is complete and accurate.  ADDENDUM:  His CEA is elevated at 8.0, previously 4.6 at the time of his lung cancer diagnosis. He also has a  biclonal gammopathy, with 1 monoclonal spike of 1.7 and the other 0.5.  I will plan to bring the patient back for further discussion and evaluation of these abnormalities.

## 2021-05-12 DIAGNOSIS — Z992 Dependence on renal dialysis: Secondary | ICD-10-CM | POA: Diagnosis not present

## 2021-05-12 DIAGNOSIS — D509 Iron deficiency anemia, unspecified: Secondary | ICD-10-CM | POA: Diagnosis not present

## 2021-05-12 DIAGNOSIS — N186 End stage renal disease: Secondary | ICD-10-CM | POA: Diagnosis not present

## 2021-05-12 DIAGNOSIS — N2581 Secondary hyperparathyroidism of renal origin: Secondary | ICD-10-CM | POA: Diagnosis not present

## 2021-05-12 DIAGNOSIS — E1122 Type 2 diabetes mellitus with diabetic chronic kidney disease: Secondary | ICD-10-CM | POA: Diagnosis not present

## 2021-05-12 DIAGNOSIS — E876 Hypokalemia: Secondary | ICD-10-CM | POA: Diagnosis not present

## 2021-05-15 DIAGNOSIS — N186 End stage renal disease: Secondary | ICD-10-CM | POA: Diagnosis not present

## 2021-05-15 DIAGNOSIS — E1122 Type 2 diabetes mellitus with diabetic chronic kidney disease: Secondary | ICD-10-CM | POA: Diagnosis not present

## 2021-05-15 DIAGNOSIS — E876 Hypokalemia: Secondary | ICD-10-CM | POA: Diagnosis not present

## 2021-05-15 DIAGNOSIS — Z992 Dependence on renal dialysis: Secondary | ICD-10-CM | POA: Diagnosis not present

## 2021-05-15 DIAGNOSIS — D509 Iron deficiency anemia, unspecified: Secondary | ICD-10-CM | POA: Diagnosis not present

## 2021-05-15 DIAGNOSIS — N2581 Secondary hyperparathyroidism of renal origin: Secondary | ICD-10-CM | POA: Diagnosis not present

## 2021-05-17 ENCOUNTER — Other Ambulatory Visit: Payer: Self-pay | Admitting: Oncology

## 2021-05-17 DIAGNOSIS — Z992 Dependence on renal dialysis: Secondary | ICD-10-CM | POA: Diagnosis not present

## 2021-05-17 DIAGNOSIS — E1122 Type 2 diabetes mellitus with diabetic chronic kidney disease: Secondary | ICD-10-CM | POA: Diagnosis not present

## 2021-05-17 DIAGNOSIS — N2581 Secondary hyperparathyroidism of renal origin: Secondary | ICD-10-CM | POA: Diagnosis not present

## 2021-05-17 DIAGNOSIS — D509 Iron deficiency anemia, unspecified: Secondary | ICD-10-CM | POA: Diagnosis not present

## 2021-05-17 DIAGNOSIS — C3432 Malignant neoplasm of lower lobe, left bronchus or lung: Secondary | ICD-10-CM

## 2021-05-17 DIAGNOSIS — E876 Hypokalemia: Secondary | ICD-10-CM | POA: Diagnosis not present

## 2021-05-17 DIAGNOSIS — N186 End stage renal disease: Secondary | ICD-10-CM | POA: Diagnosis not present

## 2021-05-18 ENCOUNTER — Encounter: Payer: Self-pay | Admitting: Oncology

## 2021-05-18 ENCOUNTER — Other Ambulatory Visit: Payer: Self-pay | Admitting: Oncology

## 2021-05-18 ENCOUNTER — Inpatient Hospital Stay (INDEPENDENT_AMBULATORY_CARE_PROVIDER_SITE_OTHER): Payer: Medicare Other | Admitting: Oncology

## 2021-05-18 ENCOUNTER — Inpatient Hospital Stay: Payer: Medicare Other | Attending: Oncology

## 2021-05-18 ENCOUNTER — Other Ambulatory Visit: Payer: Self-pay

## 2021-05-18 VITALS — BP 115/56 | HR 103 | Temp 97.9°F | Resp 18 | Ht 64.0 in | Wt 142.4 lb

## 2021-05-18 DIAGNOSIS — D509 Iron deficiency anemia, unspecified: Secondary | ICD-10-CM | POA: Diagnosis not present

## 2021-05-18 DIAGNOSIS — C3432 Malignant neoplasm of lower lobe, left bronchus or lung: Secondary | ICD-10-CM

## 2021-05-18 DIAGNOSIS — D649 Anemia, unspecified: Secondary | ICD-10-CM | POA: Diagnosis not present

## 2021-05-18 DIAGNOSIS — E8809 Other disorders of plasma-protein metabolism, not elsewhere classified: Secondary | ICD-10-CM

## 2021-05-18 LAB — CBC AND DIFFERENTIAL
HCT: 32 — AB (ref 41–53)
Hemoglobin: 10.9 — AB (ref 13.5–17.5)
Neutrophils Absolute: 4.39
Platelets: 224 (ref 150–399)
WBC: 8.6

## 2021-05-18 LAB — HEPATIC FUNCTION PANEL
ALT: 52 — AB (ref 10–40)
AST: 45 — AB (ref 14–40)
Alkaline Phosphatase: 126 — AB (ref 25–125)
Bilirubin, Total: 0.8

## 2021-05-18 LAB — COMPREHENSIVE METABOLIC PANEL
Albumin: 4.1 (ref 3.5–5.0)
Calcium: 8.7 (ref 8.7–10.7)

## 2021-05-18 LAB — BASIC METABOLIC PANEL
BUN: 29 — AB (ref 4–21)
CO2: 28 — AB (ref 13–22)
Chloride: 98 — AB (ref 99–108)
Creatinine: 6.7 — AB (ref 0.6–1.3)
Glucose: 123
Potassium: 3.9 (ref 3.4–5.3)
Sodium: 140 (ref 137–147)

## 2021-05-18 LAB — CBC: RBC: 3.31 — AB (ref 3.87–5.11)

## 2021-05-19 DIAGNOSIS — D509 Iron deficiency anemia, unspecified: Secondary | ICD-10-CM | POA: Diagnosis not present

## 2021-05-19 DIAGNOSIS — Z992 Dependence on renal dialysis: Secondary | ICD-10-CM | POA: Diagnosis not present

## 2021-05-19 DIAGNOSIS — E1122 Type 2 diabetes mellitus with diabetic chronic kidney disease: Secondary | ICD-10-CM | POA: Diagnosis not present

## 2021-05-19 DIAGNOSIS — N186 End stage renal disease: Secondary | ICD-10-CM | POA: Diagnosis not present

## 2021-05-19 DIAGNOSIS — N2581 Secondary hyperparathyroidism of renal origin: Secondary | ICD-10-CM | POA: Diagnosis not present

## 2021-05-19 DIAGNOSIS — E876 Hypokalemia: Secondary | ICD-10-CM | POA: Diagnosis not present

## 2021-05-20 LAB — CEA: CEA: 8 ng/mL — ABNORMAL HIGH (ref 0.0–4.7)

## 2021-05-21 DIAGNOSIS — D472 Monoclonal gammopathy: Secondary | ICD-10-CM | POA: Insufficient documentation

## 2021-05-22 DIAGNOSIS — Z992 Dependence on renal dialysis: Secondary | ICD-10-CM | POA: Diagnosis not present

## 2021-05-22 DIAGNOSIS — E1122 Type 2 diabetes mellitus with diabetic chronic kidney disease: Secondary | ICD-10-CM | POA: Diagnosis not present

## 2021-05-22 DIAGNOSIS — N186 End stage renal disease: Secondary | ICD-10-CM | POA: Diagnosis not present

## 2021-05-22 DIAGNOSIS — E876 Hypokalemia: Secondary | ICD-10-CM | POA: Diagnosis not present

## 2021-05-22 DIAGNOSIS — D509 Iron deficiency anemia, unspecified: Secondary | ICD-10-CM | POA: Diagnosis not present

## 2021-05-22 DIAGNOSIS — N2581 Secondary hyperparathyroidism of renal origin: Secondary | ICD-10-CM | POA: Diagnosis not present

## 2021-05-22 LAB — PROTEIN ELECTROPHORESIS, SERUM
A/G Ratio: 0.6 — ABNORMAL LOW (ref 0.7–1.7)
Albumin ELP: 3.1 g/dL (ref 2.9–4.4)
Alpha-1-Globulin: 0.3 g/dL (ref 0.0–0.4)
Alpha-2-Globulin: 0.9 g/dL (ref 0.4–1.0)
Beta Globulin: 0.8 g/dL (ref 0.7–1.3)
Gamma Globulin: 2.9 g/dL — ABNORMAL HIGH (ref 0.4–1.8)
Globulin, Total: 4.9 g/dL — ABNORMAL HIGH (ref 2.2–3.9)
M-Spike, %: 1.7 g/dL — ABNORMAL HIGH
Total Protein ELP: 8 g/dL (ref 6.0–8.5)

## 2021-05-23 DIAGNOSIS — J439 Emphysema, unspecified: Secondary | ICD-10-CM | POA: Diagnosis not present

## 2021-05-23 DIAGNOSIS — C3490 Malignant neoplasm of unspecified part of unspecified bronchus or lung: Secondary | ICD-10-CM | POA: Diagnosis not present

## 2021-05-23 DIAGNOSIS — R911 Solitary pulmonary nodule: Secondary | ICD-10-CM | POA: Diagnosis not present

## 2021-05-23 DIAGNOSIS — I700 Atherosclerosis of aorta: Secondary | ICD-10-CM | POA: Diagnosis not present

## 2021-05-23 DIAGNOSIS — C3432 Malignant neoplasm of lower lobe, left bronchus or lung: Secondary | ICD-10-CM | POA: Diagnosis not present

## 2021-05-24 DIAGNOSIS — N186 End stage renal disease: Secondary | ICD-10-CM | POA: Diagnosis not present

## 2021-05-24 DIAGNOSIS — D509 Iron deficiency anemia, unspecified: Secondary | ICD-10-CM | POA: Diagnosis not present

## 2021-05-24 DIAGNOSIS — E876 Hypokalemia: Secondary | ICD-10-CM | POA: Diagnosis not present

## 2021-05-24 DIAGNOSIS — E1122 Type 2 diabetes mellitus with diabetic chronic kidney disease: Secondary | ICD-10-CM | POA: Diagnosis not present

## 2021-05-24 DIAGNOSIS — Z992 Dependence on renal dialysis: Secondary | ICD-10-CM | POA: Diagnosis not present

## 2021-05-24 DIAGNOSIS — N2581 Secondary hyperparathyroidism of renal origin: Secondary | ICD-10-CM | POA: Diagnosis not present

## 2021-05-26 DIAGNOSIS — N186 End stage renal disease: Secondary | ICD-10-CM | POA: Diagnosis not present

## 2021-05-26 DIAGNOSIS — E1122 Type 2 diabetes mellitus with diabetic chronic kidney disease: Secondary | ICD-10-CM | POA: Diagnosis not present

## 2021-05-26 DIAGNOSIS — N2581 Secondary hyperparathyroidism of renal origin: Secondary | ICD-10-CM | POA: Diagnosis not present

## 2021-05-26 DIAGNOSIS — E876 Hypokalemia: Secondary | ICD-10-CM | POA: Diagnosis not present

## 2021-05-26 DIAGNOSIS — Z992 Dependence on renal dialysis: Secondary | ICD-10-CM | POA: Diagnosis not present

## 2021-05-26 DIAGNOSIS — D509 Iron deficiency anemia, unspecified: Secondary | ICD-10-CM | POA: Diagnosis not present

## 2021-05-29 DIAGNOSIS — N186 End stage renal disease: Secondary | ICD-10-CM | POA: Diagnosis not present

## 2021-05-29 DIAGNOSIS — D509 Iron deficiency anemia, unspecified: Secondary | ICD-10-CM | POA: Diagnosis not present

## 2021-05-29 DIAGNOSIS — E876 Hypokalemia: Secondary | ICD-10-CM | POA: Diagnosis not present

## 2021-05-29 DIAGNOSIS — Z992 Dependence on renal dialysis: Secondary | ICD-10-CM | POA: Diagnosis not present

## 2021-05-29 DIAGNOSIS — N2581 Secondary hyperparathyroidism of renal origin: Secondary | ICD-10-CM | POA: Diagnosis not present

## 2021-05-29 DIAGNOSIS — E1122 Type 2 diabetes mellitus with diabetic chronic kidney disease: Secondary | ICD-10-CM | POA: Diagnosis not present

## 2021-05-30 ENCOUNTER — Encounter: Payer: Self-pay | Admitting: Oncology

## 2021-05-31 DIAGNOSIS — N186 End stage renal disease: Secondary | ICD-10-CM | POA: Diagnosis not present

## 2021-05-31 DIAGNOSIS — Z992 Dependence on renal dialysis: Secondary | ICD-10-CM | POA: Diagnosis not present

## 2021-05-31 DIAGNOSIS — N2581 Secondary hyperparathyroidism of renal origin: Secondary | ICD-10-CM | POA: Diagnosis not present

## 2021-05-31 DIAGNOSIS — E1122 Type 2 diabetes mellitus with diabetic chronic kidney disease: Secondary | ICD-10-CM | POA: Diagnosis not present

## 2021-05-31 DIAGNOSIS — D509 Iron deficiency anemia, unspecified: Secondary | ICD-10-CM | POA: Diagnosis not present

## 2021-05-31 DIAGNOSIS — E876 Hypokalemia: Secondary | ICD-10-CM | POA: Diagnosis not present

## 2021-06-02 DIAGNOSIS — N2581 Secondary hyperparathyroidism of renal origin: Secondary | ICD-10-CM | POA: Diagnosis not present

## 2021-06-02 DIAGNOSIS — N186 End stage renal disease: Secondary | ICD-10-CM | POA: Diagnosis not present

## 2021-06-02 DIAGNOSIS — E1122 Type 2 diabetes mellitus with diabetic chronic kidney disease: Secondary | ICD-10-CM | POA: Diagnosis not present

## 2021-06-02 DIAGNOSIS — E876 Hypokalemia: Secondary | ICD-10-CM | POA: Diagnosis not present

## 2021-06-02 DIAGNOSIS — Z992 Dependence on renal dialysis: Secondary | ICD-10-CM | POA: Diagnosis not present

## 2021-06-02 DIAGNOSIS — D509 Iron deficiency anemia, unspecified: Secondary | ICD-10-CM | POA: Diagnosis not present

## 2021-06-04 DIAGNOSIS — E785 Hyperlipidemia, unspecified: Secondary | ICD-10-CM | POA: Diagnosis not present

## 2021-06-04 DIAGNOSIS — E118 Type 2 diabetes mellitus with unspecified complications: Secondary | ICD-10-CM | POA: Diagnosis not present

## 2021-06-04 DIAGNOSIS — E039 Hypothyroidism, unspecified: Secondary | ICD-10-CM | POA: Diagnosis not present

## 2021-06-04 DIAGNOSIS — E559 Vitamin D deficiency, unspecified: Secondary | ICD-10-CM | POA: Diagnosis not present

## 2021-06-04 DIAGNOSIS — N185 Chronic kidney disease, stage 5: Secondary | ICD-10-CM | POA: Diagnosis not present

## 2021-06-04 DIAGNOSIS — Z940 Kidney transplant status: Secondary | ICD-10-CM | POA: Diagnosis not present

## 2021-06-05 DIAGNOSIS — N2581 Secondary hyperparathyroidism of renal origin: Secondary | ICD-10-CM | POA: Diagnosis not present

## 2021-06-05 DIAGNOSIS — E1122 Type 2 diabetes mellitus with diabetic chronic kidney disease: Secondary | ICD-10-CM | POA: Diagnosis not present

## 2021-06-05 DIAGNOSIS — N186 End stage renal disease: Secondary | ICD-10-CM | POA: Diagnosis not present

## 2021-06-05 DIAGNOSIS — D509 Iron deficiency anemia, unspecified: Secondary | ICD-10-CM | POA: Diagnosis not present

## 2021-06-05 DIAGNOSIS — Z992 Dependence on renal dialysis: Secondary | ICD-10-CM | POA: Diagnosis not present

## 2021-06-05 DIAGNOSIS — E876 Hypokalemia: Secondary | ICD-10-CM | POA: Diagnosis not present

## 2021-06-07 DIAGNOSIS — D631 Anemia in chronic kidney disease: Secondary | ICD-10-CM | POA: Diagnosis not present

## 2021-06-07 DIAGNOSIS — D509 Iron deficiency anemia, unspecified: Secondary | ICD-10-CM | POA: Diagnosis not present

## 2021-06-07 DIAGNOSIS — Z992 Dependence on renal dialysis: Secondary | ICD-10-CM | POA: Diagnosis not present

## 2021-06-07 DIAGNOSIS — N186 End stage renal disease: Secondary | ICD-10-CM | POA: Diagnosis not present

## 2021-06-07 DIAGNOSIS — N2581 Secondary hyperparathyroidism of renal origin: Secondary | ICD-10-CM | POA: Diagnosis not present

## 2021-06-07 DIAGNOSIS — T8610 Unspecified complication of kidney transplant: Secondary | ICD-10-CM | POA: Diagnosis not present

## 2021-06-07 DIAGNOSIS — E876 Hypokalemia: Secondary | ICD-10-CM | POA: Diagnosis not present

## 2021-06-07 DIAGNOSIS — Z23 Encounter for immunization: Secondary | ICD-10-CM | POA: Diagnosis not present

## 2021-06-09 DIAGNOSIS — N2581 Secondary hyperparathyroidism of renal origin: Secondary | ICD-10-CM | POA: Diagnosis not present

## 2021-06-09 DIAGNOSIS — N186 End stage renal disease: Secondary | ICD-10-CM | POA: Diagnosis not present

## 2021-06-09 DIAGNOSIS — D509 Iron deficiency anemia, unspecified: Secondary | ICD-10-CM | POA: Diagnosis not present

## 2021-06-09 DIAGNOSIS — Z992 Dependence on renal dialysis: Secondary | ICD-10-CM | POA: Diagnosis not present

## 2021-06-09 DIAGNOSIS — D631 Anemia in chronic kidney disease: Secondary | ICD-10-CM | POA: Diagnosis not present

## 2021-06-09 DIAGNOSIS — E876 Hypokalemia: Secondary | ICD-10-CM | POA: Diagnosis not present

## 2021-06-12 DIAGNOSIS — Z992 Dependence on renal dialysis: Secondary | ICD-10-CM | POA: Diagnosis not present

## 2021-06-12 DIAGNOSIS — N186 End stage renal disease: Secondary | ICD-10-CM | POA: Diagnosis not present

## 2021-06-12 DIAGNOSIS — D509 Iron deficiency anemia, unspecified: Secondary | ICD-10-CM | POA: Diagnosis not present

## 2021-06-12 DIAGNOSIS — E876 Hypokalemia: Secondary | ICD-10-CM | POA: Diagnosis not present

## 2021-06-12 DIAGNOSIS — N2581 Secondary hyperparathyroidism of renal origin: Secondary | ICD-10-CM | POA: Diagnosis not present

## 2021-06-12 DIAGNOSIS — D631 Anemia in chronic kidney disease: Secondary | ICD-10-CM | POA: Diagnosis not present

## 2021-06-14 DIAGNOSIS — N2581 Secondary hyperparathyroidism of renal origin: Secondary | ICD-10-CM | POA: Diagnosis not present

## 2021-06-14 DIAGNOSIS — N186 End stage renal disease: Secondary | ICD-10-CM | POA: Diagnosis not present

## 2021-06-14 DIAGNOSIS — Z992 Dependence on renal dialysis: Secondary | ICD-10-CM | POA: Diagnosis not present

## 2021-06-14 DIAGNOSIS — D631 Anemia in chronic kidney disease: Secondary | ICD-10-CM | POA: Diagnosis not present

## 2021-06-14 DIAGNOSIS — D509 Iron deficiency anemia, unspecified: Secondary | ICD-10-CM | POA: Diagnosis not present

## 2021-06-14 DIAGNOSIS — E876 Hypokalemia: Secondary | ICD-10-CM | POA: Diagnosis not present

## 2021-06-16 DIAGNOSIS — N2581 Secondary hyperparathyroidism of renal origin: Secondary | ICD-10-CM | POA: Diagnosis not present

## 2021-06-16 DIAGNOSIS — E876 Hypokalemia: Secondary | ICD-10-CM | POA: Diagnosis not present

## 2021-06-16 DIAGNOSIS — D509 Iron deficiency anemia, unspecified: Secondary | ICD-10-CM | POA: Diagnosis not present

## 2021-06-16 DIAGNOSIS — N186 End stage renal disease: Secondary | ICD-10-CM | POA: Diagnosis not present

## 2021-06-16 DIAGNOSIS — Z992 Dependence on renal dialysis: Secondary | ICD-10-CM | POA: Diagnosis not present

## 2021-06-16 DIAGNOSIS — D631 Anemia in chronic kidney disease: Secondary | ICD-10-CM | POA: Diagnosis not present

## 2021-06-19 DIAGNOSIS — E876 Hypokalemia: Secondary | ICD-10-CM | POA: Diagnosis not present

## 2021-06-19 DIAGNOSIS — D509 Iron deficiency anemia, unspecified: Secondary | ICD-10-CM | POA: Diagnosis not present

## 2021-06-19 DIAGNOSIS — Z992 Dependence on renal dialysis: Secondary | ICD-10-CM | POA: Diagnosis not present

## 2021-06-19 DIAGNOSIS — N186 End stage renal disease: Secondary | ICD-10-CM | POA: Diagnosis not present

## 2021-06-19 DIAGNOSIS — D631 Anemia in chronic kidney disease: Secondary | ICD-10-CM | POA: Diagnosis not present

## 2021-06-19 DIAGNOSIS — N2581 Secondary hyperparathyroidism of renal origin: Secondary | ICD-10-CM | POA: Diagnosis not present

## 2021-06-20 ENCOUNTER — Other Ambulatory Visit: Payer: Self-pay | Admitting: Oncology

## 2021-06-20 DIAGNOSIS — D472 Monoclonal gammopathy: Secondary | ICD-10-CM

## 2021-06-20 DIAGNOSIS — R970 Elevated carcinoembryonic antigen [CEA]: Secondary | ICD-10-CM

## 2021-06-21 ENCOUNTER — Telehealth: Payer: Self-pay | Admitting: Oncology

## 2021-06-21 DIAGNOSIS — Z992 Dependence on renal dialysis: Secondary | ICD-10-CM | POA: Diagnosis not present

## 2021-06-21 DIAGNOSIS — N186 End stage renal disease: Secondary | ICD-10-CM | POA: Diagnosis not present

## 2021-06-21 DIAGNOSIS — N2581 Secondary hyperparathyroidism of renal origin: Secondary | ICD-10-CM | POA: Diagnosis not present

## 2021-06-21 DIAGNOSIS — D509 Iron deficiency anemia, unspecified: Secondary | ICD-10-CM | POA: Diagnosis not present

## 2021-06-21 DIAGNOSIS — D631 Anemia in chronic kidney disease: Secondary | ICD-10-CM | POA: Diagnosis not present

## 2021-06-21 DIAGNOSIS — E876 Hypokalemia: Secondary | ICD-10-CM | POA: Diagnosis not present

## 2021-06-21 NOTE — Telephone Encounter (Signed)
Per 9/14 Staff Msg, patient scheduled for 9/16 Labs, 9/30 Follow Up

## 2021-06-22 ENCOUNTER — Inpatient Hospital Stay: Payer: Medicare Other | Attending: Oncology

## 2021-06-22 DIAGNOSIS — Z940 Kidney transplant status: Secondary | ICD-10-CM | POA: Insufficient documentation

## 2021-06-22 DIAGNOSIS — Z79899 Other long term (current) drug therapy: Secondary | ICD-10-CM | POA: Diagnosis not present

## 2021-06-22 DIAGNOSIS — D472 Monoclonal gammopathy: Secondary | ICD-10-CM | POA: Insufficient documentation

## 2021-06-22 DIAGNOSIS — C3432 Malignant neoplasm of lower lobe, left bronchus or lung: Secondary | ICD-10-CM | POA: Diagnosis not present

## 2021-06-22 DIAGNOSIS — D509 Iron deficiency anemia, unspecified: Secondary | ICD-10-CM | POA: Diagnosis not present

## 2021-06-22 DIAGNOSIS — R970 Elevated carcinoembryonic antigen [CEA]: Secondary | ICD-10-CM

## 2021-06-22 DIAGNOSIS — Z923 Personal history of irradiation: Secondary | ICD-10-CM | POA: Insufficient documentation

## 2021-06-22 DIAGNOSIS — Z992 Dependence on renal dialysis: Secondary | ICD-10-CM | POA: Insufficient documentation

## 2021-06-22 DIAGNOSIS — N186 End stage renal disease: Secondary | ICD-10-CM | POA: Diagnosis not present

## 2021-06-23 DIAGNOSIS — D631 Anemia in chronic kidney disease: Secondary | ICD-10-CM | POA: Diagnosis not present

## 2021-06-23 DIAGNOSIS — E876 Hypokalemia: Secondary | ICD-10-CM | POA: Diagnosis not present

## 2021-06-23 DIAGNOSIS — N186 End stage renal disease: Secondary | ICD-10-CM | POA: Diagnosis not present

## 2021-06-23 DIAGNOSIS — D509 Iron deficiency anemia, unspecified: Secondary | ICD-10-CM | POA: Diagnosis not present

## 2021-06-23 DIAGNOSIS — N2581 Secondary hyperparathyroidism of renal origin: Secondary | ICD-10-CM | POA: Diagnosis not present

## 2021-06-23 DIAGNOSIS — Z992 Dependence on renal dialysis: Secondary | ICD-10-CM | POA: Diagnosis not present

## 2021-06-23 LAB — BETA 2 MICROGLOBULIN, SERUM: Beta-2 Microglobulin: 34.5 mg/L — ABNORMAL HIGH (ref 0.6–2.4)

## 2021-06-23 LAB — CEA: CEA: 7.3 ng/mL — ABNORMAL HIGH (ref 0.0–4.7)

## 2021-06-25 LAB — KAPPA/LAMBDA LIGHT CHAINS
Kappa free light chain: 323.6 mg/L — ABNORMAL HIGH (ref 3.3–19.4)
Kappa, lambda light chain ratio: 0.96 (ref 0.26–1.65)
Lambda free light chains: 337 mg/L — ABNORMAL HIGH (ref 5.7–26.3)

## 2021-06-26 ENCOUNTER — Telehealth: Payer: Self-pay

## 2021-06-26 DIAGNOSIS — E876 Hypokalemia: Secondary | ICD-10-CM | POA: Diagnosis not present

## 2021-06-26 DIAGNOSIS — N2581 Secondary hyperparathyroidism of renal origin: Secondary | ICD-10-CM | POA: Diagnosis not present

## 2021-06-26 DIAGNOSIS — Z992 Dependence on renal dialysis: Secondary | ICD-10-CM | POA: Diagnosis not present

## 2021-06-26 DIAGNOSIS — N186 End stage renal disease: Secondary | ICD-10-CM | POA: Diagnosis not present

## 2021-06-26 DIAGNOSIS — D509 Iron deficiency anemia, unspecified: Secondary | ICD-10-CM | POA: Diagnosis not present

## 2021-06-26 DIAGNOSIS — D631 Anemia in chronic kidney disease: Secondary | ICD-10-CM | POA: Diagnosis not present

## 2021-06-26 NOTE — Telephone Encounter (Addendum)
Noted. ----- Message from Derwood Kaplan, MD sent at 06/20/2021  6:57 PM EDT ----- Regarding: RE: Scan results Called and spoke with wife/mother?  CT chest looks okay, likely radiation consolidation of LLL, will monitor.  But also has increased CEA of 8, up from 4.6 eight months ago. Also explained we see monoclonal protein, biclonal, likely benign but needs f/u.  No signif urine output Will have him come in for labs to repeat CEA, SPEP, IFE, QIG's, SLC's but not UPEP. Then appt with me 2 weeks later to review results and decide whether needs skeletal survey &/or BM ----- Message ----- From: Dairl Ponder, RN Sent: 06/20/2021  10:31 AM EDT To: Derwood Kaplan, MD Subject: Scan results                                   Pt's mom, Terrence Dupont, called to ask for scan results. "We haven't heard anything from the scans. They were done about a month ago I think". [phone]

## 2021-06-27 LAB — MISC LABCORP TEST (SEND OUT): Labcorp test code: 120256

## 2021-06-28 DIAGNOSIS — Z992 Dependence on renal dialysis: Secondary | ICD-10-CM | POA: Diagnosis not present

## 2021-06-28 DIAGNOSIS — D509 Iron deficiency anemia, unspecified: Secondary | ICD-10-CM | POA: Diagnosis not present

## 2021-06-28 DIAGNOSIS — N186 End stage renal disease: Secondary | ICD-10-CM | POA: Diagnosis not present

## 2021-06-28 DIAGNOSIS — D631 Anemia in chronic kidney disease: Secondary | ICD-10-CM | POA: Diagnosis not present

## 2021-06-28 DIAGNOSIS — N2581 Secondary hyperparathyroidism of renal origin: Secondary | ICD-10-CM | POA: Diagnosis not present

## 2021-06-28 DIAGNOSIS — E876 Hypokalemia: Secondary | ICD-10-CM | POA: Diagnosis not present

## 2021-06-29 NOTE — Progress Notes (Addendum)
[facility]  [address] [place],  [place]  [address] [phone]  THIS VISIT WAS CONDUCTED VIA TELEHEALTH WITH THE CONSENT OF THE PATIENT BY TELEPHONE.  THIS LASTED 15 MINUTES.  I connected with [name] on 07/06/21 by telephone and verified that I am speaking with the correct person using two identifiers. ' LOCATION:  Patient:  Home                       Provider: [place] at [facility] Day:  07/06/2021  Referring physician: Elenore Paddy, NP   This document serves as a record of services personally performed by Hosie Poisson, MD. It was created on their behalf by Curry,Lauren E, a trained medical scribe. The creation of this record is based on the scribe's personal observations and the provider's statements to them.  CHIEF COMPLAINT:  CC: Stage I lung malignancy   Current Treatment:  Surveillance   HISTORY OF PRESENT ILLNESS:  Jerremy Maione is a 70 y.o. male with PET diagnosed stage I lung malignancy.  This was found when the patient had a fall in his home and presented to the [facility] [place] in November 2021 due to right shoulder and right chest wall pain.  Imaging revealed right 8, 9 and 10 rib fractures.  However, CT scan showed an incidental finding of a 2.0 x 1.4 cm macrolobulated nodule in the anterior aspect of the left lower lobe abutting the left hemidiaphragm.  PET imaging was pursued on December 3rd confirming the lobulated left lower lobe pulmonary nodule measuring 1.8 x 1.7 cm to be hypermetabolic with an SUV max of 4.4, suspicious for possible malignancy.  There was also intense hypermetabolic activity associated with the superior aspect of the left ear associated with soft tissue thickening on the CT, which is suspicious for neoplasm.  No other evidence of malignancy was observed.  We did not pursue pathologic confirmation in view of his comorbidities.  He would be a high risk for  surgery or invasive procedures.  He was referred for stereotactic radiation, completed in February 2022.  INTERVAL HISTORY:  [name] is here for routine follow up. CT chest from August revealed nodular consolidation in the anterior left lower lobe measuring approximately 2.7 x 4.4 cm, previously 1.4 x 2.1 cm, which is likely due to the effects of radiation therapy of a previously seen discrete nodule. Mild subpleural reticulation appears to be somewhat basilar predominance which can be seen with usual interstitial pneumonitis or fibrotic nonspecific interstitial pneumonitis. CEA did rise to 8.0 in August, but was down to 7.3 by September. SPEP in August did reveal a biclonal M-spike of 1.7. I contacted the patient and attempted to explain this abnormality. I recommended that he come back in for further lab evaluation, and repeat SPEP. The repeat SPEP now shows a monoclonal spike of 1.5 and his IgG is over 2500. Immunoelectrophoresis confirmed an IgG kappa and IgG lambda monoclonal spikes. Both kappa and lambda light chains are elevated but the ratio is normal. Beta 2 microglobulin was elevated to 34.5. This may be related to his end stage renal disease and hemodialysis. I reviewed this information with the patient.  He continues hemodialysis Tuesdays, Thursdays and Saturdays. He has nausea and vomiting intermittently. He is asking whether it is okay for him to take Mircera. I encouraged him to proceed as this is an erythropoietin stimulating agent, and he should benefit  from this. He denies fever, chills or other signs of infection.  He denies nausea, vomiting, bowel issues, or abdominal pain.  He denies sore throat, cough, dyspnea, or chest pain.  REVIEW OF SYSTEMS:  Review of Systems  Constitutional: Negative.  Negative for appetite change, chills, fatigue, fever and unexpected weight change.  HENT:  Negative.    Eyes: Negative.   Respiratory: Negative.  Negative for chest tightness, cough, hemoptysis,  shortness of breath and wheezing.   Cardiovascular: Negative.  Negative for chest pain, leg swelling and palpitations.  Gastrointestinal:  Positive for nausea (intermittent) and vomiting (intermittent). Negative for abdominal distention, abdominal pain, blood in stool, constipation and diarrhea.  Endocrine: Negative.   Genitourinary: Negative.  Negative for difficulty urinating, dysuria, frequency and hematuria.   Musculoskeletal: Negative.  Negative for arthralgias, back pain, flank pain, gait problem and myalgias.  Skin: Negative.   Neurological: Negative.  Negative for dizziness, extremity weakness, gait problem, headaches, light-headedness, numbness, seizures and speech difficulty.  Hematological: Negative.   Psychiatric/Behavioral: Negative.  Negative for depression and sleep disturbance. The patient is not nervous/anxious.   All other systems reviewed and are negative.   VITALS:  There were no vitals taken for this visit.  Wt Readings from Last 3 Encounters:  05/18/21 142 lb 6.4 oz (64.6 kg)  03/02/21 142 lb 1.6 oz (64.5 kg)  02/07/21 142 lb (64.4 kg)    There is no height or weight on file to calculate BMI.  Performance status (ECOG): 1 - Symptomatic but completely ambulatory  PHYSICAL EXAM:  Physical Exam was not conducted as the visit was via Telehealth  LABS:   CBC Latest Ref Rng & Units 05/18/2021 02/07/2021 01/15/2021  WBC - 8.6 - 5.4  Hemoglobin 13.5 - 17.5 10.9(A) 9.9(L) 11.3(A)  Hematocrit 41 - 53 32(A) 29.0(L) 34(A)  Platelets 150 - 399 224 - 156   CMP Latest Ref Rng & Units 05/18/2021 02/07/2021 01/15/2021  Glucose 70 - 99 mg/dL - 116(H) -  BUN 4 - 21 29(A) 24(H) 30(A)  Creatinine 0.6 - 1.3 6.7(A) 4.50(H) 5.1(A)  Sodium 137 - 147 140 138 138  Potassium 3.4 - 5.3 3.9 3.9 4.1  Chloride 99 - 108 98(A) 100 105  CO2 13 - 22 28(A) - 22  Calcium 8.7 - 10.7 8.7 - 9.1  Total Protein 6.3 - 8.2 g/dL - - 8.3(A)  Total Bilirubin 0.0 - 1.2 mg/dL - - -  Alkaline Phos 25 - 125  126(A) - 150(A)  AST 14 - 40 45(A) - 24  ALT 10 - 40 52(A) - 48(A)     Lab Results  Component Value Date   CEA1 7.3 (H) 06/22/2021   /  CEA  Date Value Ref Range Status  06/22/2021 7.3 (H) 0.0 - 4.7 ng/mL Final    Comment:    (NOTE)                             Nonsmokers          <3.9                             Smokers             <5.6 [employer] Electrochemiluminescence Immunoassay (ECLIA) Values obtained with different assay methods or kits cannot be used interchangeably.  Results cannot be interpreted as absolute evidence of the presence or absence of malignant disease.  Performed At: [facility] [place], [place] [id number] Rush Farmer MD WU:[id number]    Component Ref Range & Units 1 mo ago  (05/18/21)  Total Protein ELP 6.0 - 8.5 g/dL 8.0   Albumin ELP 2.9 - 4.4 g/dL 3.1   Alpha-1-Globulin 0.0 - 0.4 g/dL 0.3   Alpha-2-Globulin 0.4 - 1.0 g/dL 0.9   Beta Globulin 0.7 - 1.3 g/dL 0.8   Gamma Globulin 0.4 - 1.8 g/dL 2.9 High    M-Spike, % Not Observed g/dL 1.7 High     Immunoglobulin G, Qn, Serum    2526        [facility] ] mg/dL    BN      Reference Range: [phone]                              Immunoglobulin A, Qn, Serum    123              mg/dL    BN      Reference Range: 61-437                                Immunoglobulin M, Qn, Serum    1143        [H ] mg/dL    BN      Reference Range: 15-143                                 Protein, Total                 7.7              g/dL     BN      Reference Range: 6.0-8.5                                Albumin                        3.1              g/dL     BN      Reference Range: 2.9-4.4                                Alpha-1-Globulin               0.3              g/dL     BN      Reference Range: 0.0-0.4                                Alpha-2-Globulin               0.8              g/dL     BN      Reference Range: 0.4-1.0                                Beta Globulin  0.8              g/dL     BN      Reference Range: 0.7-1.3                                Gamma Globulin                 2.7         [H ] g/dL     BN      Reference Range: 0.4-1.8                                M-Spike                        1.5         [H ] g/dL     BN      Reference Range: Not Observed                          An additional m-spike was observed at a concentration of 0.3 g/dl  Globulin, Total                4.6         [H ] g/dL     BN      Reference Range: 2.2-3.9                                A/G Ratio                      0.7                       BN      Reference Range: 0.7-1.7                                Immunofixation Result, Serum   Comment     Linda.Low ]          BN    Immunofixation shows IgG monoclonal protein with lambda light chain  specificity.  Immunofixation shows IgM monoclonal protein with lambda light chain  specificity.  Please note:                   Comment                   BN    Protein electrophoresis scan will follow via computer, mail, or  courier delivery.  Free Kappa Lt Chains,S         283.2       [H ] mg/L     BN      Reference Range: 3.3-19.4                              Free Lambda Lt Chains,S        312.8       [facility] ] mg/L     BN      Reference Range: 5.7-26.3  Kappa/Lambda Ratio,S           0.91                      BN     Kappa free light chain 3.3 - 19.4 mg/L 323.6 High    Lambda free light chains 5.7 - 26.3 mg/L 337.0 High    Kappa, lambda light chain ratio 0.26 - 1.65 0.96    STUDIES:  No results found.   EXAM: 05/23/2021 CT CHEST WITHOUT CONTRAST TECHNIQUE: Multidetector CT imaging of the chest was performed following the standard protocol without IV contrast. COMPARISON: 10/13/2020. FINDINGS: Cardiovascular: Atherosclerotic calcification of the aorta. Heart is enlarged. No pericardial effusion. Mediastinum/Nodes: No pathologically enlarged mediastinal or axillary lymph nodes. Hilar regions are  difficult to definitively evaluate without IV contrast. Esophagus is grossly unremarkable. Lungs/Pleura: Calcified granulomas. Mild paraseptal emphysema. There may be mild subpleural reticulation, somewhat basilar predominant, similar. Nodular consolidation in the subpleural anterior left lower lobe measures approximately 2.7 x 4.4 cm, previously a discrete nodule measuring 1.4 x 2.1 cm. Lungs are otherwise clear. No pleural fluid. Airway is unremarkable. Upper Abdomen: Subcentimeter low-attenuation lesions in the right hepatic lobe are too small to characterize. Stones in the gallbladder. Visualized portions of the adrenal glands, spleen, pancreas, stomach and bowel are unremarkable. Upper abdominal lymph nodes are not enlarged by CT size criteria. Musculoskeletal: Degenerative changes in the spine and left shoulder. Old right rib fractures. Probable scattered bone islands in the spine. No worrisome lytic or sclerotic lesions. IMPRESSION: 1. Nodular consolidation in the anterior left lower lobe measures larger than on 10/13/2020 which is likely due to the effects of radiation therapy of a previously seen discrete nodule. 2. Mild subpleural reticulation appears to be somewhat basilar predominant which can be seen with usual interstitial pneumonitis or fibrotic nonspecific interstitial pneumonitis. 3. Cholelithiasis. 4. Aortic atherosclerosis (ICD10-I70.0). 5. Emphysema (ICD10-J43.9).  HISTORY:   Allergies:  Allergies  Allergen Reactions   Vancomycin Itching    Other reaction(s): Breathing Problems, Flushing (Red Skin)   Latex Other (See Comments)    Unknown    Methoxy Polyethylene Glycol-Epoetin Beta Other (See Comments)    Unknown    Current Medications: Current Outpatient Medications  Medication Sig Dispense Refill   albuterol (VENTOLIN HFA) 108 (90 Base) MCG/ACT inhaler Inhale 1-2 puffs into the lungs every 4 (four) hours as needed for wheezing or shortness of breath.      atorvastatin (LIPITOR) 20 MG tablet TAKE 1 TABLET(20 MG) BY MOUTH DAILY (Patient taking differently: Take 20 mg by mouth daily.) 90 tablet 2   B Complex-C-Folic Acid (DIALYVITE TABLET) TABS Take 1 tablet by mouth daily.     doxycycline (VIBRA-TABS) 100 MG tablet Take 100 mg by mouth 2 (two) times daily.     levothyroxine (SYNTHROID) 50 MCG tablet Take 50 mcg by mouth daily before breakfast.     lidocaine-prilocaine (EMLA) cream APPLY SMALL AMOUNT TO ACCESS SITE (AVF) 1 TO 2 HOURS BEFORE DIALYSIS. COVER WITH OCCLUSIVE DRESSING (SARAN WRAP)     tamsulosin (FLOMAX) 0.4 MG CAPS capsule Take 0.4 mg by mouth daily.     Vitamin D, Ergocalciferol, (DRISDOL) 1.25 MG (50000 UNIT) CAPS capsule Take 50,000 Units by mouth once a week.     No current facility-administered medications for this visit.     ASSESSMENT & PLAN:   Assessment:   1.  Lobulated left lower lobe pulmonary nodule measuring 1.8-2.0 cm.  This was confirmed hypermerabolic on PET imaging.  He underwent SBRT, completed in February of 2022. CEA did rise to 8.0 in August, but was down to 7.3 by September. CT scan from August revealed nodular consolidation in the anterior left lower lobe measuring approximately 2.7 x 4.4 cm, previously 1.4 x 2.1 cm, which is likely due to the effects of radiation therapy. Mild subpleural reticulation appears to have a somewhat basilar predominance which can be seen with usual interstitial pneumonitis or fibrotic nonspecific interstitial pneumonitis..  2. Skin cancers. He has had multiple lesions removed. He will continue to follow with Dermatology.  3. End stage renal disease and history of kidney transplants, in the 70's and 80's.  He is currently on hemodialysis Tuesdays, Thursdays and Saturdays.   4. Anemia, which is likely anemia of chronic disease related to his ESRD, so I have recommended that he go ahead with Mircera through hemodialysis.   5. Monoclonal gammopathy of unknown significance.  SPEP did  reveal a biclonal M-spike of 1.7. Beta 2 microglobulin was elevated to 34.5. Repeat SPEP in September was 1.5 and his total IgG level was over 2500. I doubt that this is representative of multiple myeloma, but we will continue to monitor. If his level rises, we would consider a skeletal survey and bone marrow examination.  6. Elevation of CEA with a fluctuating course and I suspect is related to his end stage renal disease and dialysis, but we will continue to monitor this.  Plan: He will continue with hemodialysis.  We will see him back in clinic in 2 months with repeat CBC, CMP, CEA, SPEP, quantitative immunoglobulins, beta 2 microglobulin and serum light chains for evaluation.  He verbalizes understanding of and agreement to the plans discussed today. He knows to call the office should any new questions or concerns arise.   I provided 15 minutes time over the phone during this this encounter and > 50% was spent counseling as documented under my assessment and plan.    Derwood Kaplan, MD [address] [facility] AT [facility] [address] [place] [address] Dept: [phone] Dept Fax: [phone]    I, Rita Ohara, am acting as scribe for Derwood Kaplan, MD  I have reviewed this report as typed by the medical scribe, and it is complete and accurate.

## 2021-06-30 DIAGNOSIS — E876 Hypokalemia: Secondary | ICD-10-CM | POA: Diagnosis not present

## 2021-06-30 DIAGNOSIS — D631 Anemia in chronic kidney disease: Secondary | ICD-10-CM | POA: Diagnosis not present

## 2021-06-30 DIAGNOSIS — Z992 Dependence on renal dialysis: Secondary | ICD-10-CM | POA: Diagnosis not present

## 2021-06-30 DIAGNOSIS — D509 Iron deficiency anemia, unspecified: Secondary | ICD-10-CM | POA: Diagnosis not present

## 2021-06-30 DIAGNOSIS — N2581 Secondary hyperparathyroidism of renal origin: Secondary | ICD-10-CM | POA: Diagnosis not present

## 2021-06-30 DIAGNOSIS — N186 End stage renal disease: Secondary | ICD-10-CM | POA: Diagnosis not present

## 2021-07-03 DIAGNOSIS — N186 End stage renal disease: Secondary | ICD-10-CM | POA: Diagnosis not present

## 2021-07-03 DIAGNOSIS — Z992 Dependence on renal dialysis: Secondary | ICD-10-CM | POA: Diagnosis not present

## 2021-07-03 DIAGNOSIS — D509 Iron deficiency anemia, unspecified: Secondary | ICD-10-CM | POA: Diagnosis not present

## 2021-07-03 DIAGNOSIS — D631 Anemia in chronic kidney disease: Secondary | ICD-10-CM | POA: Diagnosis not present

## 2021-07-03 DIAGNOSIS — N2581 Secondary hyperparathyroidism of renal origin: Secondary | ICD-10-CM | POA: Diagnosis not present

## 2021-07-03 DIAGNOSIS — E876 Hypokalemia: Secondary | ICD-10-CM | POA: Diagnosis not present

## 2021-07-05 DIAGNOSIS — N2581 Secondary hyperparathyroidism of renal origin: Secondary | ICD-10-CM | POA: Diagnosis not present

## 2021-07-05 DIAGNOSIS — D631 Anemia in chronic kidney disease: Secondary | ICD-10-CM | POA: Diagnosis not present

## 2021-07-05 DIAGNOSIS — Z992 Dependence on renal dialysis: Secondary | ICD-10-CM | POA: Diagnosis not present

## 2021-07-05 DIAGNOSIS — N186 End stage renal disease: Secondary | ICD-10-CM | POA: Diagnosis not present

## 2021-07-05 DIAGNOSIS — D509 Iron deficiency anemia, unspecified: Secondary | ICD-10-CM | POA: Diagnosis not present

## 2021-07-05 DIAGNOSIS — E876 Hypokalemia: Secondary | ICD-10-CM | POA: Diagnosis not present

## 2021-07-06 ENCOUNTER — Inpatient Hospital Stay (INDEPENDENT_AMBULATORY_CARE_PROVIDER_SITE_OTHER): Payer: Medicare Other | Admitting: Oncology

## 2021-07-06 ENCOUNTER — Other Ambulatory Visit: Payer: Self-pay | Admitting: Oncology

## 2021-07-06 ENCOUNTER — Encounter: Payer: Self-pay | Admitting: Oncology

## 2021-07-06 DIAGNOSIS — C3432 Malignant neoplasm of lower lobe, left bronchus or lung: Secondary | ICD-10-CM | POA: Diagnosis not present

## 2021-07-06 DIAGNOSIS — D472 Monoclonal gammopathy: Secondary | ICD-10-CM | POA: Diagnosis not present

## 2021-07-07 DIAGNOSIS — N186 End stage renal disease: Secondary | ICD-10-CM | POA: Diagnosis not present

## 2021-07-07 DIAGNOSIS — N2581 Secondary hyperparathyroidism of renal origin: Secondary | ICD-10-CM | POA: Diagnosis not present

## 2021-07-07 DIAGNOSIS — T8610 Unspecified complication of kidney transplant: Secondary | ICD-10-CM | POA: Diagnosis not present

## 2021-07-07 DIAGNOSIS — D631 Anemia in chronic kidney disease: Secondary | ICD-10-CM | POA: Diagnosis not present

## 2021-07-07 DIAGNOSIS — E876 Hypokalemia: Secondary | ICD-10-CM | POA: Diagnosis not present

## 2021-07-07 DIAGNOSIS — Z992 Dependence on renal dialysis: Secondary | ICD-10-CM | POA: Diagnosis not present

## 2021-07-07 DIAGNOSIS — D509 Iron deficiency anemia, unspecified: Secondary | ICD-10-CM | POA: Diagnosis not present

## 2021-07-10 DIAGNOSIS — Z992 Dependence on renal dialysis: Secondary | ICD-10-CM | POA: Diagnosis not present

## 2021-07-10 DIAGNOSIS — N2581 Secondary hyperparathyroidism of renal origin: Secondary | ICD-10-CM | POA: Diagnosis not present

## 2021-07-10 DIAGNOSIS — D509 Iron deficiency anemia, unspecified: Secondary | ICD-10-CM | POA: Diagnosis not present

## 2021-07-10 DIAGNOSIS — D631 Anemia in chronic kidney disease: Secondary | ICD-10-CM | POA: Diagnosis not present

## 2021-07-10 DIAGNOSIS — N186 End stage renal disease: Secondary | ICD-10-CM | POA: Diagnosis not present

## 2021-07-10 DIAGNOSIS — E876 Hypokalemia: Secondary | ICD-10-CM | POA: Diagnosis not present

## 2021-07-12 DIAGNOSIS — N186 End stage renal disease: Secondary | ICD-10-CM | POA: Diagnosis not present

## 2021-07-12 DIAGNOSIS — D509 Iron deficiency anemia, unspecified: Secondary | ICD-10-CM | POA: Diagnosis not present

## 2021-07-12 DIAGNOSIS — E876 Hypokalemia: Secondary | ICD-10-CM | POA: Diagnosis not present

## 2021-07-12 DIAGNOSIS — N2581 Secondary hyperparathyroidism of renal origin: Secondary | ICD-10-CM | POA: Diagnosis not present

## 2021-07-12 DIAGNOSIS — D631 Anemia in chronic kidney disease: Secondary | ICD-10-CM | POA: Diagnosis not present

## 2021-07-12 DIAGNOSIS — Z992 Dependence on renal dialysis: Secondary | ICD-10-CM | POA: Diagnosis not present

## 2021-07-14 DIAGNOSIS — Z992 Dependence on renal dialysis: Secondary | ICD-10-CM | POA: Diagnosis not present

## 2021-07-14 DIAGNOSIS — N2581 Secondary hyperparathyroidism of renal origin: Secondary | ICD-10-CM | POA: Diagnosis not present

## 2021-07-14 DIAGNOSIS — D509 Iron deficiency anemia, unspecified: Secondary | ICD-10-CM | POA: Diagnosis not present

## 2021-07-14 DIAGNOSIS — E876 Hypokalemia: Secondary | ICD-10-CM | POA: Diagnosis not present

## 2021-07-14 DIAGNOSIS — N186 End stage renal disease: Secondary | ICD-10-CM | POA: Diagnosis not present

## 2021-07-14 DIAGNOSIS — D631 Anemia in chronic kidney disease: Secondary | ICD-10-CM | POA: Diagnosis not present

## 2021-07-17 DIAGNOSIS — E876 Hypokalemia: Secondary | ICD-10-CM | POA: Diagnosis not present

## 2021-07-17 DIAGNOSIS — N186 End stage renal disease: Secondary | ICD-10-CM | POA: Diagnosis not present

## 2021-07-17 DIAGNOSIS — Z992 Dependence on renal dialysis: Secondary | ICD-10-CM | POA: Diagnosis not present

## 2021-07-17 DIAGNOSIS — N2581 Secondary hyperparathyroidism of renal origin: Secondary | ICD-10-CM | POA: Diagnosis not present

## 2021-07-17 DIAGNOSIS — D509 Iron deficiency anemia, unspecified: Secondary | ICD-10-CM | POA: Diagnosis not present

## 2021-07-17 DIAGNOSIS — D631 Anemia in chronic kidney disease: Secondary | ICD-10-CM | POA: Diagnosis not present

## 2021-07-19 DIAGNOSIS — N2581 Secondary hyperparathyroidism of renal origin: Secondary | ICD-10-CM | POA: Diagnosis not present

## 2021-07-19 DIAGNOSIS — Z992 Dependence on renal dialysis: Secondary | ICD-10-CM | POA: Diagnosis not present

## 2021-07-19 DIAGNOSIS — D631 Anemia in chronic kidney disease: Secondary | ICD-10-CM | POA: Diagnosis not present

## 2021-07-19 DIAGNOSIS — N186 End stage renal disease: Secondary | ICD-10-CM | POA: Diagnosis not present

## 2021-07-19 DIAGNOSIS — E1122 Type 2 diabetes mellitus with diabetic chronic kidney disease: Secondary | ICD-10-CM | POA: Diagnosis not present

## 2021-07-19 DIAGNOSIS — E876 Hypokalemia: Secondary | ICD-10-CM | POA: Diagnosis not present

## 2021-07-19 DIAGNOSIS — D509 Iron deficiency anemia, unspecified: Secondary | ICD-10-CM | POA: Diagnosis not present

## 2021-07-21 DIAGNOSIS — E876 Hypokalemia: Secondary | ICD-10-CM | POA: Diagnosis not present

## 2021-07-21 DIAGNOSIS — D509 Iron deficiency anemia, unspecified: Secondary | ICD-10-CM | POA: Diagnosis not present

## 2021-07-21 DIAGNOSIS — N2581 Secondary hyperparathyroidism of renal origin: Secondary | ICD-10-CM | POA: Diagnosis not present

## 2021-07-21 DIAGNOSIS — N186 End stage renal disease: Secondary | ICD-10-CM | POA: Diagnosis not present

## 2021-07-21 DIAGNOSIS — Z992 Dependence on renal dialysis: Secondary | ICD-10-CM | POA: Diagnosis not present

## 2021-07-21 DIAGNOSIS — D631 Anemia in chronic kidney disease: Secondary | ICD-10-CM | POA: Diagnosis not present

## 2021-07-24 DIAGNOSIS — N186 End stage renal disease: Secondary | ICD-10-CM | POA: Diagnosis not present

## 2021-07-24 DIAGNOSIS — Z992 Dependence on renal dialysis: Secondary | ICD-10-CM | POA: Diagnosis not present

## 2021-07-24 DIAGNOSIS — D509 Iron deficiency anemia, unspecified: Secondary | ICD-10-CM | POA: Diagnosis not present

## 2021-07-24 DIAGNOSIS — N2581 Secondary hyperparathyroidism of renal origin: Secondary | ICD-10-CM | POA: Diagnosis not present

## 2021-07-24 DIAGNOSIS — E876 Hypokalemia: Secondary | ICD-10-CM | POA: Diagnosis not present

## 2021-07-24 DIAGNOSIS — D631 Anemia in chronic kidney disease: Secondary | ICD-10-CM | POA: Diagnosis not present

## 2021-07-26 DIAGNOSIS — E876 Hypokalemia: Secondary | ICD-10-CM | POA: Diagnosis not present

## 2021-07-26 DIAGNOSIS — D631 Anemia in chronic kidney disease: Secondary | ICD-10-CM | POA: Diagnosis not present

## 2021-07-26 DIAGNOSIS — N186 End stage renal disease: Secondary | ICD-10-CM | POA: Diagnosis not present

## 2021-07-26 DIAGNOSIS — N2581 Secondary hyperparathyroidism of renal origin: Secondary | ICD-10-CM | POA: Diagnosis not present

## 2021-07-26 DIAGNOSIS — Z992 Dependence on renal dialysis: Secondary | ICD-10-CM | POA: Diagnosis not present

## 2021-07-26 DIAGNOSIS — D509 Iron deficiency anemia, unspecified: Secondary | ICD-10-CM | POA: Diagnosis not present

## 2021-07-28 DIAGNOSIS — D631 Anemia in chronic kidney disease: Secondary | ICD-10-CM | POA: Diagnosis not present

## 2021-07-28 DIAGNOSIS — Z992 Dependence on renal dialysis: Secondary | ICD-10-CM | POA: Diagnosis not present

## 2021-07-28 DIAGNOSIS — N186 End stage renal disease: Secondary | ICD-10-CM | POA: Diagnosis not present

## 2021-07-28 DIAGNOSIS — N2581 Secondary hyperparathyroidism of renal origin: Secondary | ICD-10-CM | POA: Diagnosis not present

## 2021-07-28 DIAGNOSIS — D509 Iron deficiency anemia, unspecified: Secondary | ICD-10-CM | POA: Diagnosis not present

## 2021-07-28 DIAGNOSIS — E876 Hypokalemia: Secondary | ICD-10-CM | POA: Diagnosis not present

## 2021-07-31 DIAGNOSIS — N2581 Secondary hyperparathyroidism of renal origin: Secondary | ICD-10-CM | POA: Diagnosis not present

## 2021-07-31 DIAGNOSIS — E876 Hypokalemia: Secondary | ICD-10-CM | POA: Diagnosis not present

## 2021-07-31 DIAGNOSIS — D509 Iron deficiency anemia, unspecified: Secondary | ICD-10-CM | POA: Diagnosis not present

## 2021-07-31 DIAGNOSIS — D631 Anemia in chronic kidney disease: Secondary | ICD-10-CM | POA: Diagnosis not present

## 2021-07-31 DIAGNOSIS — Z992 Dependence on renal dialysis: Secondary | ICD-10-CM | POA: Diagnosis not present

## 2021-07-31 DIAGNOSIS — N186 End stage renal disease: Secondary | ICD-10-CM | POA: Diagnosis not present

## 2021-08-02 DIAGNOSIS — D509 Iron deficiency anemia, unspecified: Secondary | ICD-10-CM | POA: Diagnosis not present

## 2021-08-02 DIAGNOSIS — N186 End stage renal disease: Secondary | ICD-10-CM | POA: Diagnosis not present

## 2021-08-02 DIAGNOSIS — E876 Hypokalemia: Secondary | ICD-10-CM | POA: Diagnosis not present

## 2021-08-02 DIAGNOSIS — N2581 Secondary hyperparathyroidism of renal origin: Secondary | ICD-10-CM | POA: Diagnosis not present

## 2021-08-02 DIAGNOSIS — D631 Anemia in chronic kidney disease: Secondary | ICD-10-CM | POA: Diagnosis not present

## 2021-08-02 DIAGNOSIS — Z992 Dependence on renal dialysis: Secondary | ICD-10-CM | POA: Diagnosis not present

## 2021-08-04 DIAGNOSIS — D509 Iron deficiency anemia, unspecified: Secondary | ICD-10-CM | POA: Diagnosis not present

## 2021-08-04 DIAGNOSIS — E876 Hypokalemia: Secondary | ICD-10-CM | POA: Diagnosis not present

## 2021-08-04 DIAGNOSIS — Z992 Dependence on renal dialysis: Secondary | ICD-10-CM | POA: Diagnosis not present

## 2021-08-04 DIAGNOSIS — N2581 Secondary hyperparathyroidism of renal origin: Secondary | ICD-10-CM | POA: Diagnosis not present

## 2021-08-04 DIAGNOSIS — D631 Anemia in chronic kidney disease: Secondary | ICD-10-CM | POA: Diagnosis not present

## 2021-08-04 DIAGNOSIS — N186 End stage renal disease: Secondary | ICD-10-CM | POA: Diagnosis not present

## 2021-08-07 DIAGNOSIS — T8610 Unspecified complication of kidney transplant: Secondary | ICD-10-CM | POA: Diagnosis not present

## 2021-08-07 DIAGNOSIS — Z992 Dependence on renal dialysis: Secondary | ICD-10-CM | POA: Diagnosis not present

## 2021-08-07 DIAGNOSIS — N186 End stage renal disease: Secondary | ICD-10-CM | POA: Diagnosis not present

## 2021-08-07 DIAGNOSIS — D631 Anemia in chronic kidney disease: Secondary | ICD-10-CM | POA: Diagnosis not present

## 2021-08-07 DIAGNOSIS — N2581 Secondary hyperparathyroidism of renal origin: Secondary | ICD-10-CM | POA: Diagnosis not present

## 2021-08-07 DIAGNOSIS — E876 Hypokalemia: Secondary | ICD-10-CM | POA: Diagnosis not present

## 2021-08-07 DIAGNOSIS — D509 Iron deficiency anemia, unspecified: Secondary | ICD-10-CM | POA: Diagnosis not present

## 2021-08-08 DIAGNOSIS — N186 End stage renal disease: Secondary | ICD-10-CM | POA: Diagnosis not present

## 2021-08-08 DIAGNOSIS — C3432 Malignant neoplasm of lower lobe, left bronchus or lung: Secondary | ICD-10-CM | POA: Diagnosis not present

## 2021-08-08 DIAGNOSIS — J441 Chronic obstructive pulmonary disease with (acute) exacerbation: Secondary | ICD-10-CM | POA: Diagnosis not present

## 2021-08-08 DIAGNOSIS — Z0000 Encounter for general adult medical examination without abnormal findings: Secondary | ICD-10-CM | POA: Diagnosis not present

## 2021-08-09 DIAGNOSIS — N2581 Secondary hyperparathyroidism of renal origin: Secondary | ICD-10-CM | POA: Diagnosis not present

## 2021-08-09 DIAGNOSIS — Z992 Dependence on renal dialysis: Secondary | ICD-10-CM | POA: Diagnosis not present

## 2021-08-09 DIAGNOSIS — D509 Iron deficiency anemia, unspecified: Secondary | ICD-10-CM | POA: Diagnosis not present

## 2021-08-09 DIAGNOSIS — D631 Anemia in chronic kidney disease: Secondary | ICD-10-CM | POA: Diagnosis not present

## 2021-08-09 DIAGNOSIS — E876 Hypokalemia: Secondary | ICD-10-CM | POA: Diagnosis not present

## 2021-08-09 DIAGNOSIS — N186 End stage renal disease: Secondary | ICD-10-CM | POA: Diagnosis not present

## 2021-08-11 DIAGNOSIS — D631 Anemia in chronic kidney disease: Secondary | ICD-10-CM | POA: Diagnosis not present

## 2021-08-11 DIAGNOSIS — Z992 Dependence on renal dialysis: Secondary | ICD-10-CM | POA: Diagnosis not present

## 2021-08-11 DIAGNOSIS — N2581 Secondary hyperparathyroidism of renal origin: Secondary | ICD-10-CM | POA: Diagnosis not present

## 2021-08-11 DIAGNOSIS — N186 End stage renal disease: Secondary | ICD-10-CM | POA: Diagnosis not present

## 2021-08-11 DIAGNOSIS — D509 Iron deficiency anemia, unspecified: Secondary | ICD-10-CM | POA: Diagnosis not present

## 2021-08-11 DIAGNOSIS — E876 Hypokalemia: Secondary | ICD-10-CM | POA: Diagnosis not present

## 2021-08-14 DIAGNOSIS — D631 Anemia in chronic kidney disease: Secondary | ICD-10-CM | POA: Diagnosis not present

## 2021-08-14 DIAGNOSIS — Z992 Dependence on renal dialysis: Secondary | ICD-10-CM | POA: Diagnosis not present

## 2021-08-14 DIAGNOSIS — E876 Hypokalemia: Secondary | ICD-10-CM | POA: Diagnosis not present

## 2021-08-14 DIAGNOSIS — N2581 Secondary hyperparathyroidism of renal origin: Secondary | ICD-10-CM | POA: Diagnosis not present

## 2021-08-14 DIAGNOSIS — D509 Iron deficiency anemia, unspecified: Secondary | ICD-10-CM | POA: Diagnosis not present

## 2021-08-14 DIAGNOSIS — N186 End stage renal disease: Secondary | ICD-10-CM | POA: Diagnosis not present

## 2021-08-16 DIAGNOSIS — Z992 Dependence on renal dialysis: Secondary | ICD-10-CM | POA: Diagnosis not present

## 2021-08-16 DIAGNOSIS — D509 Iron deficiency anemia, unspecified: Secondary | ICD-10-CM | POA: Diagnosis not present

## 2021-08-16 DIAGNOSIS — N186 End stage renal disease: Secondary | ICD-10-CM | POA: Diagnosis not present

## 2021-08-16 DIAGNOSIS — E876 Hypokalemia: Secondary | ICD-10-CM | POA: Diagnosis not present

## 2021-08-16 DIAGNOSIS — N2581 Secondary hyperparathyroidism of renal origin: Secondary | ICD-10-CM | POA: Diagnosis not present

## 2021-08-16 DIAGNOSIS — D631 Anemia in chronic kidney disease: Secondary | ICD-10-CM | POA: Diagnosis not present

## 2021-08-18 DIAGNOSIS — Z992 Dependence on renal dialysis: Secondary | ICD-10-CM | POA: Diagnosis not present

## 2021-08-18 DIAGNOSIS — N186 End stage renal disease: Secondary | ICD-10-CM | POA: Diagnosis not present

## 2021-08-18 DIAGNOSIS — D509 Iron deficiency anemia, unspecified: Secondary | ICD-10-CM | POA: Diagnosis not present

## 2021-08-18 DIAGNOSIS — N2581 Secondary hyperparathyroidism of renal origin: Secondary | ICD-10-CM | POA: Diagnosis not present

## 2021-08-18 DIAGNOSIS — D631 Anemia in chronic kidney disease: Secondary | ICD-10-CM | POA: Diagnosis not present

## 2021-08-18 DIAGNOSIS — E876 Hypokalemia: Secondary | ICD-10-CM | POA: Diagnosis not present

## 2021-08-21 DIAGNOSIS — D509 Iron deficiency anemia, unspecified: Secondary | ICD-10-CM | POA: Diagnosis not present

## 2021-08-21 DIAGNOSIS — N2581 Secondary hyperparathyroidism of renal origin: Secondary | ICD-10-CM | POA: Diagnosis not present

## 2021-08-21 DIAGNOSIS — E876 Hypokalemia: Secondary | ICD-10-CM | POA: Diagnosis not present

## 2021-08-21 DIAGNOSIS — N186 End stage renal disease: Secondary | ICD-10-CM | POA: Diagnosis not present

## 2021-08-21 DIAGNOSIS — Z992 Dependence on renal dialysis: Secondary | ICD-10-CM | POA: Diagnosis not present

## 2021-08-21 DIAGNOSIS — D631 Anemia in chronic kidney disease: Secondary | ICD-10-CM | POA: Diagnosis not present

## 2021-08-22 ENCOUNTER — Encounter: Payer: Self-pay | Admitting: Oncology

## 2021-08-22 ENCOUNTER — Inpatient Hospital Stay: Payer: Medicare Other | Attending: Oncology

## 2021-08-22 DIAGNOSIS — C3432 Malignant neoplasm of lower lobe, left bronchus or lung: Secondary | ICD-10-CM | POA: Insufficient documentation

## 2021-08-22 DIAGNOSIS — D472 Monoclonal gammopathy: Secondary | ICD-10-CM | POA: Diagnosis not present

## 2021-08-22 DIAGNOSIS — D509 Iron deficiency anemia, unspecified: Secondary | ICD-10-CM | POA: Diagnosis not present

## 2021-08-22 LAB — CBC AND DIFFERENTIAL
HCT: 31 — AB (ref 41–53)
Hemoglobin: 10.4 — AB (ref 13.5–17.5)
Neutrophils Absolute: 3.08
Platelets: 166 (ref 150–399)
WBC: 5.5

## 2021-08-22 LAB — HEPATIC FUNCTION PANEL
ALT: 60 — AB (ref 10–40)
AST: 54 — AB (ref 14–40)
Alkaline Phosphatase: 126 — AB (ref 25–125)
Bilirubin, Total: 0.8

## 2021-08-22 LAB — COMPREHENSIVE METABOLIC PANEL
Albumin: 3.8 (ref 3.5–5.0)
Calcium: 8.7 (ref 8.7–10.7)

## 2021-08-22 LAB — BASIC METABOLIC PANEL
BUN: 27 — AB (ref 4–21)
CO2: 28 — AB (ref 13–22)
Chloride: 99 (ref 99–108)
Creatinine: 7.5 — AB (ref 0.6–1.3)
Glucose: 344
Potassium: 3.8 (ref 3.4–5.3)
Sodium: 140 (ref 137–147)

## 2021-08-22 LAB — CBC: RBC: 3.12 — AB (ref 3.87–5.11)

## 2021-08-23 DIAGNOSIS — N186 End stage renal disease: Secondary | ICD-10-CM | POA: Diagnosis not present

## 2021-08-23 DIAGNOSIS — N2581 Secondary hyperparathyroidism of renal origin: Secondary | ICD-10-CM | POA: Diagnosis not present

## 2021-08-23 DIAGNOSIS — Z992 Dependence on renal dialysis: Secondary | ICD-10-CM | POA: Diagnosis not present

## 2021-08-23 DIAGNOSIS — D509 Iron deficiency anemia, unspecified: Secondary | ICD-10-CM | POA: Diagnosis not present

## 2021-08-23 DIAGNOSIS — E876 Hypokalemia: Secondary | ICD-10-CM | POA: Diagnosis not present

## 2021-08-23 DIAGNOSIS — D631 Anemia in chronic kidney disease: Secondary | ICD-10-CM | POA: Diagnosis not present

## 2021-08-24 LAB — KAPPA/LAMBDA LIGHT CHAINS
Kappa free light chain: 364.9 mg/L — ABNORMAL HIGH (ref 3.3–19.4)
Kappa, lambda light chain ratio: 1.3 (ref 0.26–1.65)
Lambda free light chains: 281.5 mg/L — ABNORMAL HIGH (ref 5.7–26.3)

## 2021-08-24 LAB — IGG, IGA, IGM
IgA: 111 mg/dL (ref 61–437)
IgG (Immunoglobin G), Serum: 2380 mg/dL — ABNORMAL HIGH (ref 603–1613)
IgM (Immunoglobulin M), Srm: 994 mg/dL — ABNORMAL HIGH (ref 15–143)

## 2021-08-24 LAB — CEA: CEA: 6.7 ng/mL — ABNORMAL HIGH (ref 0.0–4.7)

## 2021-08-25 DIAGNOSIS — E876 Hypokalemia: Secondary | ICD-10-CM | POA: Diagnosis not present

## 2021-08-25 DIAGNOSIS — N2581 Secondary hyperparathyroidism of renal origin: Secondary | ICD-10-CM | POA: Diagnosis not present

## 2021-08-25 DIAGNOSIS — Z992 Dependence on renal dialysis: Secondary | ICD-10-CM | POA: Diagnosis not present

## 2021-08-25 DIAGNOSIS — N186 End stage renal disease: Secondary | ICD-10-CM | POA: Diagnosis not present

## 2021-08-25 DIAGNOSIS — D509 Iron deficiency anemia, unspecified: Secondary | ICD-10-CM | POA: Diagnosis not present

## 2021-08-25 DIAGNOSIS — D631 Anemia in chronic kidney disease: Secondary | ICD-10-CM | POA: Diagnosis not present

## 2021-08-27 LAB — MISC LABCORP TEST (SEND OUT): Labcorp test code: 120256

## 2021-08-28 DIAGNOSIS — N2581 Secondary hyperparathyroidism of renal origin: Secondary | ICD-10-CM | POA: Diagnosis not present

## 2021-08-28 DIAGNOSIS — N186 End stage renal disease: Secondary | ICD-10-CM | POA: Diagnosis not present

## 2021-08-28 DIAGNOSIS — E876 Hypokalemia: Secondary | ICD-10-CM | POA: Diagnosis not present

## 2021-08-28 DIAGNOSIS — D631 Anemia in chronic kidney disease: Secondary | ICD-10-CM | POA: Diagnosis not present

## 2021-08-28 DIAGNOSIS — Z992 Dependence on renal dialysis: Secondary | ICD-10-CM | POA: Diagnosis not present

## 2021-08-28 DIAGNOSIS — D509 Iron deficiency anemia, unspecified: Secondary | ICD-10-CM | POA: Diagnosis not present

## 2021-08-31 DIAGNOSIS — N186 End stage renal disease: Secondary | ICD-10-CM | POA: Diagnosis not present

## 2021-08-31 DIAGNOSIS — N2581 Secondary hyperparathyroidism of renal origin: Secondary | ICD-10-CM | POA: Diagnosis not present

## 2021-08-31 DIAGNOSIS — E876 Hypokalemia: Secondary | ICD-10-CM | POA: Diagnosis not present

## 2021-08-31 DIAGNOSIS — Z992 Dependence on renal dialysis: Secondary | ICD-10-CM | POA: Diagnosis not present

## 2021-08-31 DIAGNOSIS — D631 Anemia in chronic kidney disease: Secondary | ICD-10-CM | POA: Diagnosis not present

## 2021-08-31 DIAGNOSIS — D509 Iron deficiency anemia, unspecified: Secondary | ICD-10-CM | POA: Diagnosis not present

## 2021-09-02 DIAGNOSIS — J449 Chronic obstructive pulmonary disease, unspecified: Secondary | ICD-10-CM | POA: Diagnosis not present

## 2021-09-02 DIAGNOSIS — R918 Other nonspecific abnormal finding of lung field: Secondary | ICD-10-CM | POA: Diagnosis not present

## 2021-09-02 DIAGNOSIS — Z951 Presence of aortocoronary bypass graft: Secondary | ICD-10-CM | POA: Diagnosis not present

## 2021-09-02 DIAGNOSIS — K8020 Calculus of gallbladder without cholecystitis without obstruction: Secondary | ICD-10-CM | POA: Diagnosis not present

## 2021-09-02 DIAGNOSIS — R531 Weakness: Secondary | ICD-10-CM | POA: Diagnosis not present

## 2021-09-02 DIAGNOSIS — Z85528 Personal history of other malignant neoplasm of kidney: Secondary | ICD-10-CM | POA: Diagnosis not present

## 2021-09-02 DIAGNOSIS — R4182 Altered mental status, unspecified: Secondary | ICD-10-CM | POA: Diagnosis not present

## 2021-09-02 DIAGNOSIS — R0602 Shortness of breath: Secondary | ICD-10-CM | POA: Diagnosis not present

## 2021-09-02 DIAGNOSIS — Z20822 Contact with and (suspected) exposure to covid-19: Secondary | ICD-10-CM | POA: Diagnosis not present

## 2021-09-02 DIAGNOSIS — N2581 Secondary hyperparathyroidism of renal origin: Secondary | ICD-10-CM | POA: Diagnosis not present

## 2021-09-02 DIAGNOSIS — D631 Anemia in chronic kidney disease: Secondary | ICD-10-CM | POA: Diagnosis not present

## 2021-09-02 DIAGNOSIS — E876 Hypokalemia: Secondary | ICD-10-CM | POA: Diagnosis not present

## 2021-09-02 DIAGNOSIS — R000 Tachycardia, unspecified: Secondary | ICD-10-CM | POA: Diagnosis not present

## 2021-09-02 DIAGNOSIS — Z992 Dependence on renal dialysis: Secondary | ICD-10-CM | POA: Diagnosis not present

## 2021-09-02 DIAGNOSIS — J90 Pleural effusion, not elsewhere classified: Secondary | ICD-10-CM | POA: Diagnosis not present

## 2021-09-02 DIAGNOSIS — I120 Hypertensive chronic kidney disease with stage 5 chronic kidney disease or end stage renal disease: Secondary | ICD-10-CM | POA: Diagnosis not present

## 2021-09-02 DIAGNOSIS — N186 End stage renal disease: Secondary | ICD-10-CM | POA: Diagnosis not present

## 2021-09-02 DIAGNOSIS — R0902 Hypoxemia: Secondary | ICD-10-CM | POA: Diagnosis not present

## 2021-09-02 DIAGNOSIS — I214 Non-ST elevation (NSTEMI) myocardial infarction: Secondary | ICD-10-CM | POA: Diagnosis not present

## 2021-09-02 DIAGNOSIS — K922 Gastrointestinal hemorrhage, unspecified: Secondary | ICD-10-CM | POA: Diagnosis not present

## 2021-09-02 DIAGNOSIS — N185 Chronic kidney disease, stage 5: Secondary | ICD-10-CM | POA: Diagnosis not present

## 2021-09-02 DIAGNOSIS — I10 Essential (primary) hypertension: Secondary | ICD-10-CM | POA: Diagnosis not present

## 2021-09-02 DIAGNOSIS — Z452 Encounter for adjustment and management of vascular access device: Secondary | ICD-10-CM | POA: Diagnosis not present

## 2021-09-02 DIAGNOSIS — I517 Cardiomegaly: Secondary | ICD-10-CM | POA: Diagnosis not present

## 2021-09-02 DIAGNOSIS — Z85118 Personal history of other malignant neoplasm of bronchus and lung: Secondary | ICD-10-CM | POA: Diagnosis not present

## 2021-09-02 DIAGNOSIS — D62 Acute posthemorrhagic anemia: Secondary | ICD-10-CM | POA: Diagnosis not present

## 2021-09-02 DIAGNOSIS — D509 Iron deficiency anemia, unspecified: Secondary | ICD-10-CM | POA: Diagnosis not present

## 2021-09-03 ENCOUNTER — Telehealth: Payer: Self-pay | Admitting: Hematology and Oncology

## 2021-09-03 DIAGNOSIS — B182 Chronic viral hepatitis C: Secondary | ICD-10-CM | POA: Diagnosis present

## 2021-09-03 DIAGNOSIS — R778 Other specified abnormalities of plasma proteins: Secondary | ICD-10-CM | POA: Diagnosis not present

## 2021-09-03 DIAGNOSIS — R4182 Altered mental status, unspecified: Secondary | ICD-10-CM | POA: Diagnosis not present

## 2021-09-03 DIAGNOSIS — I700 Atherosclerosis of aorta: Secondary | ICD-10-CM | POA: Diagnosis not present

## 2021-09-03 DIAGNOSIS — Z8719 Personal history of other diseases of the digestive system: Secondary | ICD-10-CM | POA: Diagnosis not present

## 2021-09-03 DIAGNOSIS — Z7409 Other reduced mobility: Secondary | ICD-10-CM | POA: Diagnosis not present

## 2021-09-03 DIAGNOSIS — D62 Acute posthemorrhagic anemia: Secondary | ICD-10-CM | POA: Diagnosis not present

## 2021-09-03 DIAGNOSIS — I132 Hypertensive heart and chronic kidney disease with heart failure and with stage 5 chronic kidney disease, or end stage renal disease: Secondary | ICD-10-CM | POA: Diagnosis present

## 2021-09-03 DIAGNOSIS — J9811 Atelectasis: Secondary | ICD-10-CM | POA: Diagnosis not present

## 2021-09-03 DIAGNOSIS — J8410 Pulmonary fibrosis, unspecified: Secondary | ICD-10-CM | POA: Diagnosis not present

## 2021-09-03 DIAGNOSIS — E1122 Type 2 diabetes mellitus with diabetic chronic kidney disease: Secondary | ICD-10-CM | POA: Diagnosis not present

## 2021-09-03 DIAGNOSIS — D123 Benign neoplasm of transverse colon: Secondary | ICD-10-CM | POA: Diagnosis not present

## 2021-09-03 DIAGNOSIS — Z85528 Personal history of other malignant neoplasm of kidney: Secondary | ICD-10-CM | POA: Diagnosis not present

## 2021-09-03 DIAGNOSIS — Z794 Long term (current) use of insulin: Secondary | ICD-10-CM | POA: Diagnosis not present

## 2021-09-03 DIAGNOSIS — Z049 Encounter for examination and observation for unspecified reason: Secondary | ICD-10-CM | POA: Diagnosis not present

## 2021-09-03 DIAGNOSIS — I209 Angina pectoris, unspecified: Secondary | ICD-10-CM | POA: Diagnosis not present

## 2021-09-03 DIAGNOSIS — R0902 Hypoxemia: Secondary | ICD-10-CM | POA: Diagnosis not present

## 2021-09-03 DIAGNOSIS — C3490 Malignant neoplasm of unspecified part of unspecified bronchus or lung: Secondary | ICD-10-CM | POA: Diagnosis not present

## 2021-09-03 DIAGNOSIS — D500 Iron deficiency anemia secondary to blood loss (chronic): Secondary | ICD-10-CM | POA: Diagnosis not present

## 2021-09-03 DIAGNOSIS — T8612 Kidney transplant failure: Secondary | ICD-10-CM | POA: Diagnosis not present

## 2021-09-03 DIAGNOSIS — N400 Enlarged prostate without lower urinary tract symptoms: Secondary | ICD-10-CM | POA: Diagnosis not present

## 2021-09-03 DIAGNOSIS — I509 Heart failure, unspecified: Secondary | ICD-10-CM | POA: Diagnosis present

## 2021-09-03 DIAGNOSIS — N184 Chronic kidney disease, stage 4 (severe): Secondary | ICD-10-CM | POA: Diagnosis not present

## 2021-09-03 DIAGNOSIS — N186 End stage renal disease: Secondary | ICD-10-CM | POA: Diagnosis present

## 2021-09-03 DIAGNOSIS — Z85828 Personal history of other malignant neoplasm of skin: Secondary | ICD-10-CM | POA: Diagnosis not present

## 2021-09-03 DIAGNOSIS — K635 Polyp of colon: Secondary | ICD-10-CM | POA: Diagnosis not present

## 2021-09-03 DIAGNOSIS — E785 Hyperlipidemia, unspecified: Secondary | ICD-10-CM | POA: Diagnosis not present

## 2021-09-03 DIAGNOSIS — J9601 Acute respiratory failure with hypoxia: Secondary | ICD-10-CM | POA: Diagnosis not present

## 2021-09-03 DIAGNOSIS — I272 Pulmonary hypertension, unspecified: Secondary | ICD-10-CM | POA: Diagnosis not present

## 2021-09-03 DIAGNOSIS — Z992 Dependence on renal dialysis: Secondary | ICD-10-CM | POA: Diagnosis not present

## 2021-09-03 DIAGNOSIS — Z940 Kidney transplant status: Secondary | ICD-10-CM | POA: Diagnosis not present

## 2021-09-03 DIAGNOSIS — R1310 Dysphagia, unspecified: Secondary | ICD-10-CM | POA: Diagnosis not present

## 2021-09-03 DIAGNOSIS — D649 Anemia, unspecified: Secondary | ICD-10-CM | POA: Diagnosis not present

## 2021-09-03 DIAGNOSIS — R059 Cough, unspecified: Secondary | ICD-10-CM | POA: Diagnosis not present

## 2021-09-03 DIAGNOSIS — Z85118 Personal history of other malignant neoplasm of bronchus and lung: Secondary | ICD-10-CM | POA: Diagnosis not present

## 2021-09-03 DIAGNOSIS — R9431 Abnormal electrocardiogram [ECG] [EKG]: Secondary | ICD-10-CM | POA: Diagnosis not present

## 2021-09-03 DIAGNOSIS — F17210 Nicotine dependence, cigarettes, uncomplicated: Secondary | ICD-10-CM | POA: Diagnosis not present

## 2021-09-03 DIAGNOSIS — E1169 Type 2 diabetes mellitus with other specified complication: Secondary | ICD-10-CM | POA: Diagnosis present

## 2021-09-03 DIAGNOSIS — D849 Immunodeficiency, unspecified: Secondary | ICD-10-CM | POA: Diagnosis not present

## 2021-09-03 DIAGNOSIS — T8610 Unspecified complication of kidney transplant: Secondary | ICD-10-CM | POA: Diagnosis not present

## 2021-09-03 DIAGNOSIS — Z79890 Hormone replacement therapy (postmenopausal): Secondary | ICD-10-CM | POA: Diagnosis not present

## 2021-09-03 DIAGNOSIS — D631 Anemia in chronic kidney disease: Secondary | ICD-10-CM | POA: Diagnosis not present

## 2021-09-03 DIAGNOSIS — J449 Chronic obstructive pulmonary disease, unspecified: Secondary | ICD-10-CM | POA: Diagnosis not present

## 2021-09-03 DIAGNOSIS — I10 Essential (primary) hypertension: Secondary | ICD-10-CM | POA: Diagnosis not present

## 2021-09-03 DIAGNOSIS — R195 Other fecal abnormalities: Secondary | ICD-10-CM | POA: Diagnosis not present

## 2021-09-03 DIAGNOSIS — D509 Iron deficiency anemia, unspecified: Secondary | ICD-10-CM | POA: Diagnosis not present

## 2021-09-03 DIAGNOSIS — N185 Chronic kidney disease, stage 5: Secondary | ICD-10-CM | POA: Diagnosis not present

## 2021-09-03 DIAGNOSIS — D472 Monoclonal gammopathy: Secondary | ICD-10-CM | POA: Diagnosis not present

## 2021-09-03 DIAGNOSIS — Z8711 Personal history of peptic ulcer disease: Secondary | ICD-10-CM | POA: Diagnosis not present

## 2021-09-03 DIAGNOSIS — I2510 Atherosclerotic heart disease of native coronary artery without angina pectoris: Secondary | ICD-10-CM | POA: Diagnosis not present

## 2021-09-03 DIAGNOSIS — E039 Hypothyroidism, unspecified: Secondary | ICD-10-CM | POA: Diagnosis not present

## 2021-09-03 DIAGNOSIS — G40909 Epilepsy, unspecified, not intractable, without status epilepticus: Secondary | ICD-10-CM | POA: Diagnosis present

## 2021-09-03 DIAGNOSIS — K922 Gastrointestinal hemorrhage, unspecified: Secondary | ICD-10-CM | POA: Diagnosis not present

## 2021-09-03 DIAGNOSIS — Z20822 Contact with and (suspected) exposure to covid-19: Secondary | ICD-10-CM | POA: Diagnosis present

## 2021-09-03 DIAGNOSIS — I120 Hypertensive chronic kidney disease with stage 5 chronic kidney disease or end stage renal disease: Secondary | ICD-10-CM | POA: Diagnosis not present

## 2021-09-03 DIAGNOSIS — K8020 Calculus of gallbladder without cholecystitis without obstruction: Secondary | ICD-10-CM | POA: Diagnosis not present

## 2021-09-03 DIAGNOSIS — Z951 Presence of aortocoronary bypass graft: Secondary | ICD-10-CM | POA: Diagnosis not present

## 2021-09-03 DIAGNOSIS — I21A1 Myocardial infarction type 2: Secondary | ICD-10-CM | POA: Diagnosis not present

## 2021-09-03 DIAGNOSIS — J90 Pleural effusion, not elsewhere classified: Secondary | ICD-10-CM | POA: Diagnosis not present

## 2021-09-03 DIAGNOSIS — Z741 Need for assistance with personal care: Secondary | ICD-10-CM | POA: Diagnosis not present

## 2021-09-03 DIAGNOSIS — I959 Hypotension, unspecified: Secondary | ICD-10-CM | POA: Diagnosis not present

## 2021-09-03 DIAGNOSIS — I214 Non-ST elevation (NSTEMI) myocardial infarction: Secondary | ICD-10-CM | POA: Diagnosis not present

## 2021-09-03 DIAGNOSIS — Z5189 Encounter for other specified aftercare: Secondary | ICD-10-CM | POA: Diagnosis not present

## 2021-09-03 DIAGNOSIS — Z452 Encounter for adjustment and management of vascular access device: Secondary | ICD-10-CM | POA: Diagnosis not present

## 2021-09-03 DIAGNOSIS — E271 Primary adrenocortical insufficiency: Secondary | ICD-10-CM | POA: Diagnosis not present

## 2021-09-03 DIAGNOSIS — K648 Other hemorrhoids: Secondary | ICD-10-CM | POA: Diagnosis not present

## 2021-09-03 DIAGNOSIS — R911 Solitary pulmonary nodule: Secondary | ICD-10-CM | POA: Diagnosis not present

## 2021-09-03 NOTE — Telephone Encounter (Signed)
Patient Canceled 11/30 Follow Up due to being Inpatient at [facility]

## 2021-09-04 DIAGNOSIS — I2510 Atherosclerotic heart disease of native coronary artery without angina pectoris: Secondary | ICD-10-CM | POA: Diagnosis not present

## 2021-09-04 DIAGNOSIS — Z992 Dependence on renal dialysis: Secondary | ICD-10-CM | POA: Diagnosis not present

## 2021-09-04 DIAGNOSIS — I120 Hypertensive chronic kidney disease with stage 5 chronic kidney disease or end stage renal disease: Secondary | ICD-10-CM | POA: Diagnosis not present

## 2021-09-04 DIAGNOSIS — N186 End stage renal disease: Secondary | ICD-10-CM | POA: Diagnosis not present

## 2021-09-04 DIAGNOSIS — I10 Essential (primary) hypertension: Secondary | ICD-10-CM | POA: Diagnosis not present

## 2021-09-04 DIAGNOSIS — I214 Non-ST elevation (NSTEMI) myocardial infarction: Secondary | ICD-10-CM | POA: Diagnosis not present

## 2021-09-04 DIAGNOSIS — D649 Anemia, unspecified: Secondary | ICD-10-CM | POA: Diagnosis not present

## 2021-09-04 DIAGNOSIS — I959 Hypotension, unspecified: Secondary | ICD-10-CM | POA: Diagnosis not present

## 2021-09-05 ENCOUNTER — Ambulatory Visit: Payer: Medicare Other | Admitting: Hematology and Oncology

## 2021-09-05 DIAGNOSIS — I214 Non-ST elevation (NSTEMI) myocardial infarction: Secondary | ICD-10-CM | POA: Diagnosis not present

## 2021-09-05 DIAGNOSIS — D62 Acute posthemorrhagic anemia: Secondary | ICD-10-CM | POA: Diagnosis not present

## 2021-09-05 DIAGNOSIS — I959 Hypotension, unspecified: Secondary | ICD-10-CM | POA: Diagnosis not present

## 2021-09-05 DIAGNOSIS — K648 Other hemorrhoids: Secondary | ICD-10-CM | POA: Diagnosis not present

## 2021-09-05 DIAGNOSIS — I120 Hypertensive chronic kidney disease with stage 5 chronic kidney disease or end stage renal disease: Secondary | ICD-10-CM | POA: Diagnosis not present

## 2021-09-05 DIAGNOSIS — D123 Benign neoplasm of transverse colon: Secondary | ICD-10-CM | POA: Diagnosis not present

## 2021-09-05 DIAGNOSIS — D631 Anemia in chronic kidney disease: Secondary | ICD-10-CM | POA: Diagnosis not present

## 2021-09-05 DIAGNOSIS — N186 End stage renal disease: Secondary | ICD-10-CM | POA: Diagnosis not present

## 2021-09-05 DIAGNOSIS — I10 Essential (primary) hypertension: Secondary | ICD-10-CM | POA: Diagnosis not present

## 2021-09-05 DIAGNOSIS — Z992 Dependence on renal dialysis: Secondary | ICD-10-CM | POA: Diagnosis not present

## 2021-09-05 DIAGNOSIS — I2510 Atherosclerotic heart disease of native coronary artery without angina pectoris: Secondary | ICD-10-CM | POA: Diagnosis not present

## 2021-09-05 DIAGNOSIS — D649 Anemia, unspecified: Secondary | ICD-10-CM | POA: Diagnosis not present

## 2021-09-05 DIAGNOSIS — N184 Chronic kidney disease, stage 4 (severe): Secondary | ICD-10-CM | POA: Diagnosis not present

## 2021-09-06 DIAGNOSIS — N186 End stage renal disease: Secondary | ICD-10-CM | POA: Diagnosis not present

## 2021-09-06 DIAGNOSIS — D649 Anemia, unspecified: Secondary | ICD-10-CM | POA: Diagnosis not present

## 2021-09-06 DIAGNOSIS — I10 Essential (primary) hypertension: Secondary | ICD-10-CM | POA: Diagnosis not present

## 2021-09-06 DIAGNOSIS — I214 Non-ST elevation (NSTEMI) myocardial infarction: Secondary | ICD-10-CM | POA: Diagnosis not present

## 2021-09-06 DIAGNOSIS — I959 Hypotension, unspecified: Secondary | ICD-10-CM | POA: Diagnosis not present

## 2021-09-06 DIAGNOSIS — Z992 Dependence on renal dialysis: Secondary | ICD-10-CM | POA: Diagnosis not present

## 2021-09-06 DIAGNOSIS — T8610 Unspecified complication of kidney transplant: Secondary | ICD-10-CM | POA: Diagnosis not present

## 2021-09-07 DIAGNOSIS — K922 Gastrointestinal hemorrhage, unspecified: Secondary | ICD-10-CM | POA: Diagnosis not present

## 2021-09-07 DIAGNOSIS — E039 Hypothyroidism, unspecified: Secondary | ICD-10-CM | POA: Diagnosis not present

## 2021-09-07 DIAGNOSIS — N186 End stage renal disease: Secondary | ICD-10-CM | POA: Diagnosis not present

## 2021-09-07 DIAGNOSIS — Z5189 Encounter for other specified aftercare: Secondary | ICD-10-CM | POA: Diagnosis not present

## 2021-09-07 DIAGNOSIS — J8410 Pulmonary fibrosis, unspecified: Secondary | ICD-10-CM | POA: Diagnosis not present

## 2021-09-07 DIAGNOSIS — R059 Cough, unspecified: Secondary | ICD-10-CM | POA: Diagnosis not present

## 2021-09-07 DIAGNOSIS — D62 Acute posthemorrhagic anemia: Secondary | ICD-10-CM | POA: Diagnosis not present

## 2021-09-07 DIAGNOSIS — I214 Non-ST elevation (NSTEMI) myocardial infarction: Secondary | ICD-10-CM | POA: Diagnosis not present

## 2021-09-07 DIAGNOSIS — E1122 Type 2 diabetes mellitus with diabetic chronic kidney disease: Secondary | ICD-10-CM | POA: Diagnosis not present

## 2021-09-07 DIAGNOSIS — E785 Hyperlipidemia, unspecified: Secondary | ICD-10-CM | POA: Diagnosis not present

## 2021-09-07 DIAGNOSIS — D631 Anemia in chronic kidney disease: Secondary | ICD-10-CM | POA: Diagnosis not present

## 2021-09-07 DIAGNOSIS — D649 Anemia, unspecified: Secondary | ICD-10-CM | POA: Diagnosis not present

## 2021-09-07 DIAGNOSIS — D472 Monoclonal gammopathy: Secondary | ICD-10-CM | POA: Diagnosis not present

## 2021-09-07 DIAGNOSIS — I10 Essential (primary) hypertension: Secondary | ICD-10-CM | POA: Diagnosis not present

## 2021-09-07 DIAGNOSIS — Z741 Need for assistance with personal care: Secondary | ICD-10-CM | POA: Diagnosis not present

## 2021-09-07 DIAGNOSIS — R911 Solitary pulmonary nodule: Secondary | ICD-10-CM | POA: Diagnosis not present

## 2021-09-07 DIAGNOSIS — N400 Enlarged prostate without lower urinary tract symptoms: Secondary | ICD-10-CM | POA: Diagnosis not present

## 2021-09-07 DIAGNOSIS — Z951 Presence of aortocoronary bypass graft: Secondary | ICD-10-CM | POA: Diagnosis not present

## 2021-09-07 DIAGNOSIS — J449 Chronic obstructive pulmonary disease, unspecified: Secondary | ICD-10-CM | POA: Diagnosis not present

## 2021-09-07 DIAGNOSIS — I120 Hypertensive chronic kidney disease with stage 5 chronic kidney disease or end stage renal disease: Secondary | ICD-10-CM | POA: Diagnosis not present

## 2021-09-07 DIAGNOSIS — N189 Chronic kidney disease, unspecified: Secondary | ICD-10-CM | POA: Diagnosis not present

## 2021-09-07 DIAGNOSIS — I21A1 Myocardial infarction type 2: Secondary | ICD-10-CM | POA: Diagnosis not present

## 2021-09-07 DIAGNOSIS — D500 Iron deficiency anemia secondary to blood loss (chronic): Secondary | ICD-10-CM | POA: Diagnosis not present

## 2021-09-07 DIAGNOSIS — T8612 Kidney transplant failure: Secondary | ICD-10-CM | POA: Diagnosis not present

## 2021-09-07 DIAGNOSIS — I2510 Atherosclerotic heart disease of native coronary artery without angina pectoris: Secondary | ICD-10-CM | POA: Diagnosis not present

## 2021-09-07 DIAGNOSIS — J9601 Acute respiratory failure with hypoxia: Secondary | ICD-10-CM | POA: Diagnosis not present

## 2021-09-07 DIAGNOSIS — D849 Immunodeficiency, unspecified: Secondary | ICD-10-CM | POA: Diagnosis not present

## 2021-09-07 DIAGNOSIS — Z992 Dependence on renal dialysis: Secondary | ICD-10-CM | POA: Diagnosis not present

## 2021-09-07 DIAGNOSIS — F17210 Nicotine dependence, cigarettes, uncomplicated: Secondary | ICD-10-CM | POA: Diagnosis not present

## 2021-09-07 DIAGNOSIS — I959 Hypotension, unspecified: Secondary | ICD-10-CM | POA: Diagnosis not present

## 2021-09-07 DIAGNOSIS — I272 Pulmonary hypertension, unspecified: Secondary | ICD-10-CM | POA: Diagnosis not present

## 2021-09-07 DIAGNOSIS — J90 Pleural effusion, not elsewhere classified: Secondary | ICD-10-CM | POA: Diagnosis not present

## 2021-09-07 DIAGNOSIS — R1310 Dysphagia, unspecified: Secondary | ICD-10-CM | POA: Diagnosis not present

## 2021-09-07 DIAGNOSIS — Z7409 Other reduced mobility: Secondary | ICD-10-CM | POA: Diagnosis not present

## 2021-09-19 ENCOUNTER — Telehealth: Payer: Self-pay | Admitting: Oncology

## 2021-09-19 NOTE — Telephone Encounter (Signed)
09/19/21 Hosp d/c follow up requested in 4weeks.Appt scheduled-Per PA at [facility].

## 2021-09-21 DIAGNOSIS — N186 End stage renal disease: Secondary | ICD-10-CM | POA: Diagnosis not present

## 2021-09-21 DIAGNOSIS — I2510 Atherosclerotic heart disease of native coronary artery without angina pectoris: Secondary | ICD-10-CM | POA: Diagnosis not present

## 2021-09-21 DIAGNOSIS — D649 Anemia, unspecified: Secondary | ICD-10-CM | POA: Diagnosis not present

## 2021-09-21 DIAGNOSIS — Z9861 Coronary angioplasty status: Secondary | ICD-10-CM | POA: Diagnosis not present

## 2021-09-22 DIAGNOSIS — N2581 Secondary hyperparathyroidism of renal origin: Secondary | ICD-10-CM | POA: Diagnosis not present

## 2021-09-22 DIAGNOSIS — D509 Iron deficiency anemia, unspecified: Secondary | ICD-10-CM | POA: Diagnosis not present

## 2021-09-22 DIAGNOSIS — N186 End stage renal disease: Secondary | ICD-10-CM | POA: Diagnosis not present

## 2021-09-22 DIAGNOSIS — Z992 Dependence on renal dialysis: Secondary | ICD-10-CM | POA: Diagnosis not present

## 2021-09-22 DIAGNOSIS — E876 Hypokalemia: Secondary | ICD-10-CM | POA: Diagnosis not present

## 2021-09-22 DIAGNOSIS — D631 Anemia in chronic kidney disease: Secondary | ICD-10-CM | POA: Diagnosis not present

## 2021-09-25 DIAGNOSIS — N2581 Secondary hyperparathyroidism of renal origin: Secondary | ICD-10-CM | POA: Diagnosis not present

## 2021-09-25 DIAGNOSIS — N186 End stage renal disease: Secondary | ICD-10-CM | POA: Diagnosis not present

## 2021-09-25 DIAGNOSIS — D631 Anemia in chronic kidney disease: Secondary | ICD-10-CM | POA: Diagnosis not present

## 2021-09-25 DIAGNOSIS — E876 Hypokalemia: Secondary | ICD-10-CM | POA: Diagnosis not present

## 2021-09-25 DIAGNOSIS — D509 Iron deficiency anemia, unspecified: Secondary | ICD-10-CM | POA: Diagnosis not present

## 2021-09-25 DIAGNOSIS — Z992 Dependence on renal dialysis: Secondary | ICD-10-CM | POA: Diagnosis not present

## 2021-09-27 DIAGNOSIS — Z992 Dependence on renal dialysis: Secondary | ICD-10-CM | POA: Diagnosis not present

## 2021-09-27 DIAGNOSIS — D509 Iron deficiency anemia, unspecified: Secondary | ICD-10-CM | POA: Diagnosis not present

## 2021-09-27 DIAGNOSIS — N186 End stage renal disease: Secondary | ICD-10-CM | POA: Diagnosis not present

## 2021-09-27 DIAGNOSIS — E876 Hypokalemia: Secondary | ICD-10-CM | POA: Diagnosis not present

## 2021-09-27 DIAGNOSIS — N2581 Secondary hyperparathyroidism of renal origin: Secondary | ICD-10-CM | POA: Diagnosis not present

## 2021-09-27 DIAGNOSIS — D631 Anemia in chronic kidney disease: Secondary | ICD-10-CM | POA: Diagnosis not present

## 2021-09-28 DIAGNOSIS — R040 Epistaxis: Secondary | ICD-10-CM | POA: Diagnosis not present

## 2021-09-29 DIAGNOSIS — Z992 Dependence on renal dialysis: Secondary | ICD-10-CM | POA: Diagnosis not present

## 2021-09-29 DIAGNOSIS — N2581 Secondary hyperparathyroidism of renal origin: Secondary | ICD-10-CM | POA: Diagnosis not present

## 2021-09-29 DIAGNOSIS — N186 End stage renal disease: Secondary | ICD-10-CM | POA: Diagnosis not present

## 2021-09-29 DIAGNOSIS — D631 Anemia in chronic kidney disease: Secondary | ICD-10-CM | POA: Diagnosis not present

## 2021-09-29 DIAGNOSIS — D509 Iron deficiency anemia, unspecified: Secondary | ICD-10-CM | POA: Diagnosis not present

## 2021-09-29 DIAGNOSIS — E876 Hypokalemia: Secondary | ICD-10-CM | POA: Diagnosis not present

## 2021-10-02 DIAGNOSIS — I517 Cardiomegaly: Secondary | ICD-10-CM | POA: Diagnosis not present

## 2021-10-02 DIAGNOSIS — N2581 Secondary hyperparathyroidism of renal origin: Secondary | ICD-10-CM | POA: Diagnosis not present

## 2021-10-02 DIAGNOSIS — J189 Pneumonia, unspecified organism: Secondary | ICD-10-CM | POA: Diagnosis not present

## 2021-10-02 DIAGNOSIS — I361 Nonrheumatic tricuspid (valve) insufficiency: Secondary | ICD-10-CM | POA: Diagnosis not present

## 2021-10-02 DIAGNOSIS — E872 Acidosis, unspecified: Secondary | ICD-10-CM | POA: Diagnosis not present

## 2021-10-02 DIAGNOSIS — R6251 Failure to thrive (child): Secondary | ICD-10-CM | POA: Diagnosis not present

## 2021-10-02 DIAGNOSIS — J439 Emphysema, unspecified: Secondary | ICD-10-CM | POA: Diagnosis not present

## 2021-10-02 DIAGNOSIS — R0902 Hypoxemia: Secondary | ICD-10-CM | POA: Diagnosis not present

## 2021-10-02 DIAGNOSIS — Z940 Kidney transplant status: Secondary | ICD-10-CM | POA: Diagnosis not present

## 2021-10-02 DIAGNOSIS — E860 Dehydration: Secondary | ICD-10-CM | POA: Diagnosis not present

## 2021-10-02 DIAGNOSIS — K5730 Diverticulosis of large intestine without perforation or abscess without bleeding: Secondary | ICD-10-CM | POA: Diagnosis not present

## 2021-10-02 DIAGNOSIS — R079 Chest pain, unspecified: Secondary | ICD-10-CM | POA: Diagnosis not present

## 2021-10-02 DIAGNOSIS — E1122 Type 2 diabetes mellitus with diabetic chronic kidney disease: Secondary | ICD-10-CM | POA: Diagnosis not present

## 2021-10-02 DIAGNOSIS — Z452 Encounter for adjustment and management of vascular access device: Secondary | ICD-10-CM | POA: Diagnosis not present

## 2021-10-02 DIAGNOSIS — I214 Non-ST elevation (NSTEMI) myocardial infarction: Secondary | ICD-10-CM | POA: Diagnosis not present

## 2021-10-02 DIAGNOSIS — R627 Adult failure to thrive: Secondary | ICD-10-CM | POA: Diagnosis not present

## 2021-10-02 DIAGNOSIS — M160 Bilateral primary osteoarthritis of hip: Secondary | ICD-10-CM | POA: Diagnosis not present

## 2021-10-02 DIAGNOSIS — D509 Iron deficiency anemia, unspecified: Secondary | ICD-10-CM | POA: Diagnosis not present

## 2021-10-02 DIAGNOSIS — E876 Hypokalemia: Secondary | ICD-10-CM | POA: Diagnosis not present

## 2021-10-02 DIAGNOSIS — N186 End stage renal disease: Secondary | ICD-10-CM | POA: Diagnosis not present

## 2021-10-02 DIAGNOSIS — G9341 Metabolic encephalopathy: Secondary | ICD-10-CM | POA: Diagnosis not present

## 2021-10-02 DIAGNOSIS — J90 Pleural effusion, not elsewhere classified: Secondary | ICD-10-CM | POA: Diagnosis not present

## 2021-10-02 DIAGNOSIS — I120 Hypertensive chronic kidney disease with stage 5 chronic kidney disease or end stage renal disease: Secondary | ICD-10-CM | POA: Diagnosis not present

## 2021-10-02 DIAGNOSIS — R531 Weakness: Secondary | ICD-10-CM | POA: Diagnosis not present

## 2021-10-02 DIAGNOSIS — Z992 Dependence on renal dialysis: Secondary | ICD-10-CM | POA: Diagnosis not present

## 2021-10-02 DIAGNOSIS — D631 Anemia in chronic kidney disease: Secondary | ICD-10-CM | POA: Diagnosis not present

## 2021-10-02 DIAGNOSIS — M47814 Spondylosis without myelopathy or radiculopathy, thoracic region: Secondary | ICD-10-CM | POA: Diagnosis not present

## 2021-10-02 DIAGNOSIS — J9811 Atelectasis: Secondary | ICD-10-CM | POA: Diagnosis not present

## 2021-10-02 DIAGNOSIS — D649 Anemia, unspecified: Secondary | ICD-10-CM | POA: Diagnosis not present

## 2021-10-02 DIAGNOSIS — I2510 Atherosclerotic heart disease of native coronary artery without angina pectoris: Secondary | ICD-10-CM | POA: Diagnosis not present

## 2021-10-02 DIAGNOSIS — S2241XA Multiple fractures of ribs, right side, initial encounter for closed fracture: Secondary | ICD-10-CM | POA: Diagnosis not present

## 2021-10-02 DIAGNOSIS — K8020 Calculus of gallbladder without cholecystitis without obstruction: Secondary | ICD-10-CM | POA: Diagnosis not present

## 2021-10-02 DIAGNOSIS — A419 Sepsis, unspecified organism: Secondary | ICD-10-CM | POA: Diagnosis not present

## 2021-10-02 DIAGNOSIS — Z951 Presence of aortocoronary bypass graft: Secondary | ICD-10-CM | POA: Diagnosis not present

## 2021-10-02 DIAGNOSIS — Z20822 Contact with and (suspected) exposure to covid-19: Secondary | ICD-10-CM | POA: Diagnosis not present

## 2021-10-03 DIAGNOSIS — R0902 Hypoxemia: Secondary | ICD-10-CM | POA: Diagnosis not present

## 2021-10-03 DIAGNOSIS — Z452 Encounter for adjustment and management of vascular access device: Secondary | ICD-10-CM | POA: Diagnosis not present

## 2021-10-03 DIAGNOSIS — J90 Pleural effusion, not elsewhere classified: Secondary | ICD-10-CM | POA: Diagnosis not present

## 2021-10-03 DIAGNOSIS — R531 Weakness: Secondary | ICD-10-CM | POA: Diagnosis not present

## 2021-10-03 DIAGNOSIS — M47814 Spondylosis without myelopathy or radiculopathy, thoracic region: Secondary | ICD-10-CM | POA: Diagnosis not present

## 2021-10-03 DIAGNOSIS — I361 Nonrheumatic tricuspid (valve) insufficiency: Secondary | ICD-10-CM | POA: Diagnosis not present

## 2021-10-03 DIAGNOSIS — I517 Cardiomegaly: Secondary | ICD-10-CM | POA: Diagnosis not present

## 2021-10-03 DIAGNOSIS — J9811 Atelectasis: Secondary | ICD-10-CM | POA: Diagnosis not present

## 2021-10-05 DIAGNOSIS — E039 Hypothyroidism, unspecified: Secondary | ICD-10-CM | POA: Diagnosis not present

## 2021-10-05 DIAGNOSIS — Z7189 Other specified counseling: Secondary | ICD-10-CM | POA: Diagnosis not present

## 2021-10-05 DIAGNOSIS — E7849 Other hyperlipidemia: Secondary | ICD-10-CM | POA: Diagnosis not present

## 2021-10-05 DIAGNOSIS — I340 Nonrheumatic mitral (valve) insufficiency: Secondary | ICD-10-CM | POA: Diagnosis not present

## 2021-10-05 DIAGNOSIS — J440 Chronic obstructive pulmonary disease with acute lower respiratory infection: Secondary | ICD-10-CM | POA: Diagnosis not present

## 2021-10-05 DIAGNOSIS — D472 Monoclonal gammopathy: Secondary | ICD-10-CM | POA: Diagnosis not present

## 2021-10-05 DIAGNOSIS — E872 Acidosis, unspecified: Secondary | ICD-10-CM | POA: Diagnosis not present

## 2021-10-05 DIAGNOSIS — Z794 Long term (current) use of insulin: Secondary | ICD-10-CM | POA: Diagnosis not present

## 2021-10-05 DIAGNOSIS — J811 Chronic pulmonary edema: Secondary | ICD-10-CM | POA: Diagnosis not present

## 2021-10-05 DIAGNOSIS — J90 Pleural effusion, not elsewhere classified: Secondary | ICD-10-CM | POA: Diagnosis not present

## 2021-10-05 DIAGNOSIS — Z992 Dependence on renal dialysis: Secondary | ICD-10-CM | POA: Diagnosis not present

## 2021-10-05 DIAGNOSIS — N400 Enlarged prostate without lower urinary tract symptoms: Secondary | ICD-10-CM | POA: Diagnosis not present

## 2021-10-05 DIAGNOSIS — I10 Essential (primary) hypertension: Secondary | ICD-10-CM | POA: Diagnosis not present

## 2021-10-05 DIAGNOSIS — A419 Sepsis, unspecified organism: Secondary | ICD-10-CM | POA: Diagnosis not present

## 2021-10-05 DIAGNOSIS — R079 Chest pain, unspecified: Secondary | ICD-10-CM | POA: Diagnosis not present

## 2021-10-05 DIAGNOSIS — I21A1 Myocardial infarction type 2: Secondary | ICD-10-CM | POA: Diagnosis not present

## 2021-10-05 DIAGNOSIS — I517 Cardiomegaly: Secondary | ICD-10-CM | POA: Diagnosis not present

## 2021-10-05 DIAGNOSIS — R0989 Other specified symptoms and signs involving the circulatory and respiratory systems: Secondary | ICD-10-CM | POA: Diagnosis not present

## 2021-10-05 DIAGNOSIS — R778 Other specified abnormalities of plasma proteins: Secondary | ICD-10-CM | POA: Diagnosis not present

## 2021-10-05 DIAGNOSIS — J9600 Acute respiratory failure, unspecified whether with hypoxia or hypercapnia: Secondary | ICD-10-CM | POA: Diagnosis not present

## 2021-10-05 DIAGNOSIS — N179 Acute kidney failure, unspecified: Secondary | ICD-10-CM | POA: Diagnosis not present

## 2021-10-05 DIAGNOSIS — D849 Immunodeficiency, unspecified: Secondary | ICD-10-CM | POA: Diagnosis not present

## 2021-10-05 DIAGNOSIS — R918 Other nonspecific abnormal finding of lung field: Secondary | ICD-10-CM | POA: Diagnosis not present

## 2021-10-05 DIAGNOSIS — I120 Hypertensive chronic kidney disease with stage 5 chronic kidney disease or end stage renal disease: Secondary | ICD-10-CM | POA: Diagnosis not present

## 2021-10-05 DIAGNOSIS — N186 End stage renal disease: Secondary | ICD-10-CM | POA: Diagnosis not present

## 2021-10-05 DIAGNOSIS — I959 Hypotension, unspecified: Secondary | ICD-10-CM | POA: Diagnosis not present

## 2021-10-05 DIAGNOSIS — G9341 Metabolic encephalopathy: Secondary | ICD-10-CM | POA: Diagnosis not present

## 2021-10-05 DIAGNOSIS — I214 Non-ST elevation (NSTEMI) myocardial infarction: Secondary | ICD-10-CM | POA: Diagnosis not present

## 2021-10-05 DIAGNOSIS — A4102 Sepsis due to Methicillin resistant Staphylococcus aureus: Secondary | ICD-10-CM | POA: Diagnosis not present

## 2021-10-05 DIAGNOSIS — E11649 Type 2 diabetes mellitus with hypoglycemia without coma: Secondary | ICD-10-CM | POA: Diagnosis not present

## 2021-10-05 DIAGNOSIS — E785 Hyperlipidemia, unspecified: Secondary | ICD-10-CM | POA: Diagnosis not present

## 2021-10-05 DIAGNOSIS — R0603 Acute respiratory distress: Secondary | ICD-10-CM | POA: Diagnosis not present

## 2021-10-05 DIAGNOSIS — E1122 Type 2 diabetes mellitus with diabetic chronic kidney disease: Secondary | ICD-10-CM | POA: Diagnosis not present

## 2021-10-05 DIAGNOSIS — Z87798 Personal history of other (corrected) congenital malformations: Secondary | ICD-10-CM | POA: Diagnosis not present

## 2021-10-05 DIAGNOSIS — J441 Chronic obstructive pulmonary disease with (acute) exacerbation: Secondary | ICD-10-CM | POA: Diagnosis not present

## 2021-10-05 DIAGNOSIS — R0600 Dyspnea, unspecified: Secondary | ICD-10-CM | POA: Diagnosis not present

## 2021-10-05 DIAGNOSIS — J984 Other disorders of lung: Secondary | ICD-10-CM | POA: Diagnosis not present

## 2021-10-05 DIAGNOSIS — J15212 Pneumonia due to Methicillin resistant Staphylococcus aureus: Secondary | ICD-10-CM | POA: Diagnosis not present

## 2021-10-05 DIAGNOSIS — N189 Chronic kidney disease, unspecified: Secondary | ICD-10-CM | POA: Diagnosis not present

## 2021-10-05 DIAGNOSIS — D62 Acute posthemorrhagic anemia: Secondary | ICD-10-CM | POA: Diagnosis not present

## 2021-10-05 DIAGNOSIS — D649 Anemia, unspecified: Secondary | ICD-10-CM | POA: Diagnosis not present

## 2021-10-05 DIAGNOSIS — I2510 Atherosclerotic heart disease of native coronary artery without angina pectoris: Secondary | ICD-10-CM | POA: Diagnosis not present

## 2021-10-05 DIAGNOSIS — I25119 Atherosclerotic heart disease of native coronary artery with unspecified angina pectoris: Secondary | ICD-10-CM | POA: Diagnosis not present

## 2021-10-05 DIAGNOSIS — I132 Hypertensive heart and chronic kidney disease with heart failure and with stage 5 chronic kidney disease, or end stage renal disease: Secondary | ICD-10-CM | POA: Diagnosis not present

## 2021-10-05 DIAGNOSIS — I429 Cardiomyopathy, unspecified: Secondary | ICD-10-CM | POA: Diagnosis not present

## 2021-10-05 DIAGNOSIS — Z87891 Personal history of nicotine dependence: Secondary | ICD-10-CM | POA: Diagnosis not present

## 2021-10-05 DIAGNOSIS — I081 Rheumatic disorders of both mitral and tricuspid valves: Secondary | ICD-10-CM | POA: Diagnosis not present

## 2021-10-05 DIAGNOSIS — G9340 Encephalopathy, unspecified: Secondary | ICD-10-CM | POA: Diagnosis not present

## 2021-10-05 DIAGNOSIS — E871 Hypo-osmolality and hyponatremia: Secondary | ICD-10-CM | POA: Diagnosis not present

## 2021-10-05 DIAGNOSIS — Z940 Kidney transplant status: Secondary | ICD-10-CM | POA: Diagnosis not present

## 2021-10-05 DIAGNOSIS — R410 Disorientation, unspecified: Secondary | ICD-10-CM | POA: Diagnosis not present

## 2021-10-05 DIAGNOSIS — Z515 Encounter for palliative care: Secondary | ICD-10-CM | POA: Diagnosis not present

## 2021-10-05 DIAGNOSIS — R6521 Severe sepsis with septic shock: Secondary | ICD-10-CM | POA: Diagnosis not present

## 2021-10-05 DIAGNOSIS — J189 Pneumonia, unspecified organism: Secondary | ICD-10-CM | POA: Diagnosis not present

## 2021-10-05 DIAGNOSIS — I5020 Unspecified systolic (congestive) heart failure: Secondary | ICD-10-CM | POA: Diagnosis not present

## 2021-10-05 DIAGNOSIS — E860 Dehydration: Secondary | ICD-10-CM | POA: Diagnosis not present

## 2021-10-06 DIAGNOSIS — E7849 Other hyperlipidemia: Secondary | ICD-10-CM | POA: Diagnosis not present

## 2021-10-06 DIAGNOSIS — R079 Chest pain, unspecified: Secondary | ICD-10-CM | POA: Diagnosis not present

## 2021-10-06 DIAGNOSIS — E11649 Type 2 diabetes mellitus with hypoglycemia without coma: Secondary | ICD-10-CM | POA: Diagnosis not present

## 2021-10-06 DIAGNOSIS — N189 Chronic kidney disease, unspecified: Secondary | ICD-10-CM | POA: Diagnosis not present

## 2021-10-06 DIAGNOSIS — I081 Rheumatic disorders of both mitral and tricuspid valves: Secondary | ICD-10-CM | POA: Diagnosis not present

## 2021-10-06 DIAGNOSIS — G9340 Encephalopathy, unspecified: Secondary | ICD-10-CM | POA: Diagnosis not present

## 2021-10-06 DIAGNOSIS — N186 End stage renal disease: Secondary | ICD-10-CM | POA: Diagnosis not present

## 2021-10-06 DIAGNOSIS — A419 Sepsis, unspecified organism: Secondary | ICD-10-CM | POA: Diagnosis not present

## 2021-10-06 DIAGNOSIS — J189 Pneumonia, unspecified organism: Secondary | ICD-10-CM | POA: Diagnosis not present

## 2021-10-06 DIAGNOSIS — I517 Cardiomegaly: Secondary | ICD-10-CM | POA: Diagnosis not present

## 2021-10-06 DIAGNOSIS — Z992 Dependence on renal dialysis: Secondary | ICD-10-CM | POA: Diagnosis not present

## 2021-10-06 DIAGNOSIS — R778 Other specified abnormalities of plasma proteins: Secondary | ICD-10-CM | POA: Diagnosis not present

## 2021-10-06 DIAGNOSIS — Z794 Long term (current) use of insulin: Secondary | ICD-10-CM | POA: Diagnosis not present

## 2021-10-06 DIAGNOSIS — I959 Hypotension, unspecified: Secondary | ICD-10-CM | POA: Diagnosis not present

## 2021-10-06 DIAGNOSIS — I2510 Atherosclerotic heart disease of native coronary artery without angina pectoris: Secondary | ICD-10-CM | POA: Diagnosis not present

## 2021-10-06 DIAGNOSIS — N400 Enlarged prostate without lower urinary tract symptoms: Secondary | ICD-10-CM | POA: Diagnosis not present

## 2021-10-06 DIAGNOSIS — E039 Hypothyroidism, unspecified: Secondary | ICD-10-CM | POA: Diagnosis not present

## 2021-10-06 DIAGNOSIS — E871 Hypo-osmolality and hyponatremia: Secondary | ICD-10-CM | POA: Diagnosis not present

## 2021-10-06 DIAGNOSIS — I10 Essential (primary) hypertension: Secondary | ICD-10-CM | POA: Diagnosis not present

## 2021-10-06 DIAGNOSIS — I25119 Atherosclerotic heart disease of native coronary artery with unspecified angina pectoris: Secondary | ICD-10-CM | POA: Diagnosis not present

## 2021-10-06 DIAGNOSIS — D649 Anemia, unspecified: Secondary | ICD-10-CM | POA: Diagnosis not present

## 2021-10-06 DIAGNOSIS — N179 Acute kidney failure, unspecified: Secondary | ICD-10-CM | POA: Diagnosis not present

## 2021-10-06 DIAGNOSIS — I214 Non-ST elevation (NSTEMI) myocardial infarction: Secondary | ICD-10-CM | POA: Diagnosis not present

## 2021-10-07 DIAGNOSIS — A419 Sepsis, unspecified organism: Secondary | ICD-10-CM | POA: Diagnosis not present

## 2021-10-07 DIAGNOSIS — I25119 Atherosclerotic heart disease of native coronary artery with unspecified angina pectoris: Secondary | ICD-10-CM | POA: Diagnosis not present

## 2021-10-07 DIAGNOSIS — D649 Anemia, unspecified: Secondary | ICD-10-CM | POA: Diagnosis not present

## 2021-10-07 DIAGNOSIS — N186 End stage renal disease: Secondary | ICD-10-CM | POA: Diagnosis not present

## 2021-10-07 DIAGNOSIS — G9340 Encephalopathy, unspecified: Secondary | ICD-10-CM | POA: Diagnosis not present

## 2021-10-07 DIAGNOSIS — R778 Other specified abnormalities of plasma proteins: Secondary | ICD-10-CM | POA: Diagnosis not present

## 2021-10-07 DIAGNOSIS — Z992 Dependence on renal dialysis: Secondary | ICD-10-CM | POA: Diagnosis not present

## 2021-10-07 DIAGNOSIS — I959 Hypotension, unspecified: Secondary | ICD-10-CM | POA: Diagnosis not present

## 2021-10-08 DIAGNOSIS — A419 Sepsis, unspecified organism: Secondary | ICD-10-CM | POA: Diagnosis not present

## 2021-10-08 DIAGNOSIS — N186 End stage renal disease: Secondary | ICD-10-CM | POA: Diagnosis not present

## 2021-10-08 DIAGNOSIS — I25119 Atherosclerotic heart disease of native coronary artery with unspecified angina pectoris: Secondary | ICD-10-CM | POA: Diagnosis not present

## 2021-10-08 DIAGNOSIS — I340 Nonrheumatic mitral (valve) insufficiency: Secondary | ICD-10-CM | POA: Diagnosis not present

## 2021-10-08 DIAGNOSIS — D649 Anemia, unspecified: Secondary | ICD-10-CM | POA: Diagnosis not present

## 2021-10-08 DIAGNOSIS — R778 Other specified abnormalities of plasma proteins: Secondary | ICD-10-CM | POA: Diagnosis not present

## 2021-10-08 DIAGNOSIS — I959 Hypotension, unspecified: Secondary | ICD-10-CM | POA: Diagnosis not present

## 2021-10-08 DIAGNOSIS — Z992 Dependence on renal dialysis: Secondary | ICD-10-CM | POA: Diagnosis not present

## 2021-10-08 DIAGNOSIS — G9340 Encephalopathy, unspecified: Secondary | ICD-10-CM | POA: Diagnosis not present

## 2021-10-08 DIAGNOSIS — I5020 Unspecified systolic (congestive) heart failure: Secondary | ICD-10-CM | POA: Diagnosis not present

## 2021-10-09 DIAGNOSIS — A419 Sepsis, unspecified organism: Secondary | ICD-10-CM | POA: Diagnosis not present

## 2021-10-09 DIAGNOSIS — I340 Nonrheumatic mitral (valve) insufficiency: Secondary | ICD-10-CM | POA: Diagnosis not present

## 2021-10-09 DIAGNOSIS — D649 Anemia, unspecified: Secondary | ICD-10-CM | POA: Diagnosis not present

## 2021-10-09 DIAGNOSIS — I959 Hypotension, unspecified: Secondary | ICD-10-CM | POA: Diagnosis not present

## 2021-10-09 DIAGNOSIS — N186 End stage renal disease: Secondary | ICD-10-CM | POA: Diagnosis not present

## 2021-10-09 DIAGNOSIS — Z992 Dependence on renal dialysis: Secondary | ICD-10-CM | POA: Diagnosis not present

## 2021-10-09 DIAGNOSIS — G9340 Encephalopathy, unspecified: Secondary | ICD-10-CM | POA: Diagnosis not present

## 2021-10-09 DIAGNOSIS — I5020 Unspecified systolic (congestive) heart failure: Secondary | ICD-10-CM | POA: Diagnosis not present

## 2021-10-10 DIAGNOSIS — I959 Hypotension, unspecified: Secondary | ICD-10-CM | POA: Diagnosis not present

## 2021-10-10 DIAGNOSIS — G9340 Encephalopathy, unspecified: Secondary | ICD-10-CM | POA: Diagnosis not present

## 2021-10-10 DIAGNOSIS — I25119 Atherosclerotic heart disease of native coronary artery with unspecified angina pectoris: Secondary | ICD-10-CM | POA: Diagnosis not present

## 2021-10-10 DIAGNOSIS — E11649 Type 2 diabetes mellitus with hypoglycemia without coma: Secondary | ICD-10-CM | POA: Diagnosis not present

## 2021-10-10 DIAGNOSIS — A419 Sepsis, unspecified organism: Secondary | ICD-10-CM | POA: Diagnosis not present

## 2021-10-10 DIAGNOSIS — J189 Pneumonia, unspecified organism: Secondary | ICD-10-CM | POA: Diagnosis not present

## 2021-10-10 DIAGNOSIS — I5020 Unspecified systolic (congestive) heart failure: Secondary | ICD-10-CM | POA: Diagnosis not present

## 2021-10-10 DIAGNOSIS — I214 Non-ST elevation (NSTEMI) myocardial infarction: Secondary | ICD-10-CM | POA: Diagnosis not present

## 2021-10-10 DIAGNOSIS — I340 Nonrheumatic mitral (valve) insufficiency: Secondary | ICD-10-CM | POA: Diagnosis not present

## 2021-10-10 DIAGNOSIS — E871 Hypo-osmolality and hyponatremia: Secondary | ICD-10-CM | POA: Diagnosis not present

## 2021-10-10 DIAGNOSIS — Z992 Dependence on renal dialysis: Secondary | ICD-10-CM | POA: Diagnosis not present

## 2021-10-10 DIAGNOSIS — R410 Disorientation, unspecified: Secondary | ICD-10-CM | POA: Diagnosis not present

## 2021-10-10 DIAGNOSIS — N186 End stage renal disease: Secondary | ICD-10-CM | POA: Diagnosis not present

## 2021-10-10 DIAGNOSIS — D649 Anemia, unspecified: Secondary | ICD-10-CM | POA: Diagnosis not present

## 2021-10-10 DIAGNOSIS — R079 Chest pain, unspecified: Secondary | ICD-10-CM | POA: Diagnosis not present

## 2021-10-10 DIAGNOSIS — E7849 Other hyperlipidemia: Secondary | ICD-10-CM | POA: Diagnosis not present

## 2021-10-10 DIAGNOSIS — N400 Enlarged prostate without lower urinary tract symptoms: Secondary | ICD-10-CM | POA: Diagnosis not present

## 2021-10-11 DIAGNOSIS — D649 Anemia, unspecified: Secondary | ICD-10-CM | POA: Diagnosis not present

## 2021-10-11 DIAGNOSIS — E871 Hypo-osmolality and hyponatremia: Secondary | ICD-10-CM | POA: Diagnosis not present

## 2021-10-11 DIAGNOSIS — Z992 Dependence on renal dialysis: Secondary | ICD-10-CM | POA: Diagnosis not present

## 2021-10-11 DIAGNOSIS — R079 Chest pain, unspecified: Secondary | ICD-10-CM | POA: Diagnosis not present

## 2021-10-11 DIAGNOSIS — G9340 Encephalopathy, unspecified: Secondary | ICD-10-CM | POA: Diagnosis not present

## 2021-10-11 DIAGNOSIS — I340 Nonrheumatic mitral (valve) insufficiency: Secondary | ICD-10-CM | POA: Diagnosis not present

## 2021-10-11 DIAGNOSIS — Z7189 Other specified counseling: Secondary | ICD-10-CM | POA: Diagnosis not present

## 2021-10-11 DIAGNOSIS — J189 Pneumonia, unspecified organism: Secondary | ICD-10-CM | POA: Diagnosis not present

## 2021-10-11 DIAGNOSIS — N186 End stage renal disease: Secondary | ICD-10-CM | POA: Diagnosis not present

## 2021-10-11 DIAGNOSIS — R410 Disorientation, unspecified: Secondary | ICD-10-CM | POA: Diagnosis not present

## 2021-10-11 DIAGNOSIS — I25119 Atherosclerotic heart disease of native coronary artery with unspecified angina pectoris: Secondary | ICD-10-CM | POA: Diagnosis not present

## 2021-10-11 DIAGNOSIS — I214 Non-ST elevation (NSTEMI) myocardial infarction: Secondary | ICD-10-CM | POA: Diagnosis not present

## 2021-10-11 DIAGNOSIS — E11649 Type 2 diabetes mellitus with hypoglycemia without coma: Secondary | ICD-10-CM | POA: Diagnosis not present

## 2021-10-11 DIAGNOSIS — Z515 Encounter for palliative care: Secondary | ICD-10-CM | POA: Diagnosis not present

## 2021-10-11 DIAGNOSIS — N400 Enlarged prostate without lower urinary tract symptoms: Secondary | ICD-10-CM | POA: Diagnosis not present

## 2021-10-11 DIAGNOSIS — R0600 Dyspnea, unspecified: Secondary | ICD-10-CM | POA: Diagnosis not present

## 2021-10-11 DIAGNOSIS — N179 Acute kidney failure, unspecified: Secondary | ICD-10-CM | POA: Diagnosis not present

## 2021-10-11 DIAGNOSIS — I5020 Unspecified systolic (congestive) heart failure: Secondary | ICD-10-CM | POA: Diagnosis not present

## 2021-10-11 DIAGNOSIS — I959 Hypotension, unspecified: Secondary | ICD-10-CM | POA: Diagnosis not present

## 2021-10-11 DIAGNOSIS — A419 Sepsis, unspecified organism: Secondary | ICD-10-CM | POA: Diagnosis not present

## 2021-10-11 DIAGNOSIS — E7849 Other hyperlipidemia: Secondary | ICD-10-CM | POA: Diagnosis not present

## 2021-10-12 DIAGNOSIS — G9340 Encephalopathy, unspecified: Secondary | ICD-10-CM | POA: Diagnosis not present

## 2021-10-12 DIAGNOSIS — N186 End stage renal disease: Secondary | ICD-10-CM | POA: Diagnosis not present

## 2021-10-12 DIAGNOSIS — Z515 Encounter for palliative care: Secondary | ICD-10-CM | POA: Diagnosis not present

## 2021-10-12 DIAGNOSIS — I25119 Atherosclerotic heart disease of native coronary artery with unspecified angina pectoris: Secondary | ICD-10-CM | POA: Diagnosis not present

## 2021-10-12 DIAGNOSIS — R410 Disorientation, unspecified: Secondary | ICD-10-CM | POA: Diagnosis not present

## 2021-10-12 DIAGNOSIS — R079 Chest pain, unspecified: Secondary | ICD-10-CM | POA: Diagnosis not present

## 2021-10-12 DIAGNOSIS — N179 Acute kidney failure, unspecified: Secondary | ICD-10-CM | POA: Diagnosis not present

## 2021-10-12 DIAGNOSIS — Z992 Dependence on renal dialysis: Secondary | ICD-10-CM | POA: Diagnosis not present

## 2021-10-12 DIAGNOSIS — J189 Pneumonia, unspecified organism: Secondary | ICD-10-CM | POA: Diagnosis not present

## 2021-10-12 DIAGNOSIS — Z7189 Other specified counseling: Secondary | ICD-10-CM | POA: Diagnosis not present

## 2021-10-12 DIAGNOSIS — A4102 Sepsis due to Methicillin resistant Staphylococcus aureus: Secondary | ICD-10-CM | POA: Diagnosis not present

## 2021-10-12 DIAGNOSIS — R0600 Dyspnea, unspecified: Secondary | ICD-10-CM | POA: Diagnosis not present

## 2021-10-13 DIAGNOSIS — J189 Pneumonia, unspecified organism: Secondary | ICD-10-CM | POA: Diagnosis not present

## 2021-10-13 DIAGNOSIS — I2510 Atherosclerotic heart disease of native coronary artery without angina pectoris: Secondary | ICD-10-CM | POA: Diagnosis not present

## 2021-10-13 DIAGNOSIS — E039 Hypothyroidism, unspecified: Secondary | ICD-10-CM | POA: Diagnosis not present

## 2021-10-13 DIAGNOSIS — Z940 Kidney transplant status: Secondary | ICD-10-CM | POA: Diagnosis not present

## 2021-10-13 DIAGNOSIS — A419 Sepsis, unspecified organism: Secondary | ICD-10-CM | POA: Diagnosis not present

## 2021-10-13 DIAGNOSIS — Z87798 Personal history of other (corrected) congenital malformations: Secondary | ICD-10-CM | POA: Diagnosis not present

## 2021-10-13 DIAGNOSIS — I120 Hypertensive chronic kidney disease with stage 5 chronic kidney disease or end stage renal disease: Secondary | ICD-10-CM | POA: Diagnosis not present

## 2021-10-13 DIAGNOSIS — E785 Hyperlipidemia, unspecified: Secondary | ICD-10-CM | POA: Diagnosis not present

## 2021-10-13 DIAGNOSIS — N186 End stage renal disease: Secondary | ICD-10-CM | POA: Diagnosis not present

## 2021-10-13 DIAGNOSIS — R918 Other nonspecific abnormal finding of lung field: Secondary | ICD-10-CM | POA: Diagnosis not present

## 2021-10-13 DIAGNOSIS — A4102 Sepsis due to Methicillin resistant Staphylococcus aureus: Secondary | ICD-10-CM | POA: Diagnosis not present

## 2021-10-13 DIAGNOSIS — D649 Anemia, unspecified: Secondary | ICD-10-CM | POA: Diagnosis not present

## 2021-10-13 DIAGNOSIS — E1122 Type 2 diabetes mellitus with diabetic chronic kidney disease: Secondary | ICD-10-CM | POA: Diagnosis not present

## 2021-10-17 ENCOUNTER — Ambulatory Visit: Payer: Medicare Other | Admitting: Vascular Surgery

## 2021-10-18 ENCOUNTER — Other Ambulatory Visit: Payer: Medicare Other

## 2021-10-18 ENCOUNTER — Ambulatory Visit: Payer: Medicare Other | Admitting: Oncology

## 2021-11-07 DEATH — deceased

## 2021-11-19 ENCOUNTER — Ambulatory Visit: Payer: Medicare Other | Admitting: Hematology and Oncology

## 2021-11-19 ENCOUNTER — Other Ambulatory Visit: Payer: Medicare Other

## 2022-03-12 IMAGING — PT NM PET TUM IMG INITIAL (PI) SKULL BASE T - THIGH
1 series · 4 of 4 positions shown · non-contrast
Comparison: CTs of the chest, abdomen and pelvis 08/13/2020 and
07/08/2020

CLINICAL DATA: Initial treatment strategy for left lower lobe
pulmonary nodule on CT. Cancer of unknown primary, staging.

EXAM:
NUCLEAR MEDICINE PET SKULL BASE TO THIGH
TECHNIQUE: 8.08 mCi F-18 FDG was injected intravenously. Full-ring PET imaging
was performed from the skull base to thigh after the radiotracer. CT
data was obtained and used for attenuation correction and anatomic
localization.
Fasting blood glucose: 98 mg/dl

[Series 1062: results mm oncology reading · 1.0mm · 1.09mm/px · 4 of 4 slices shown]
[im 1/4]
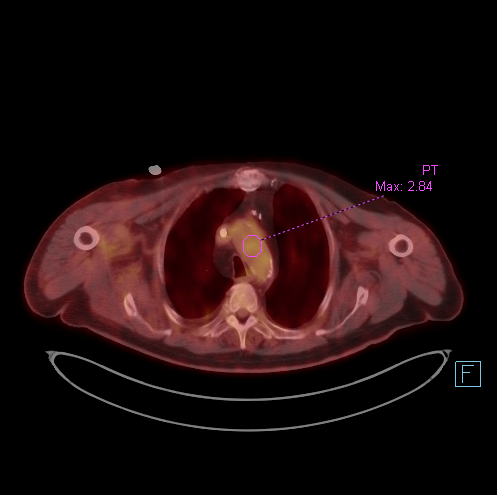
[im 2/4]
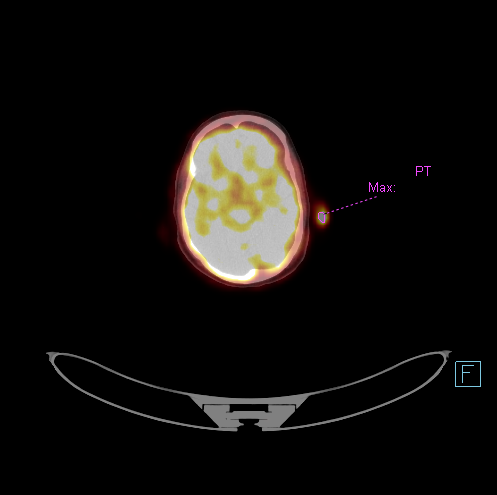
[im 3/4]
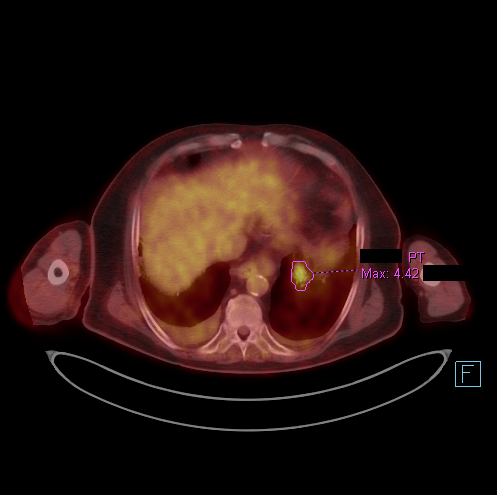
[im 4/4]
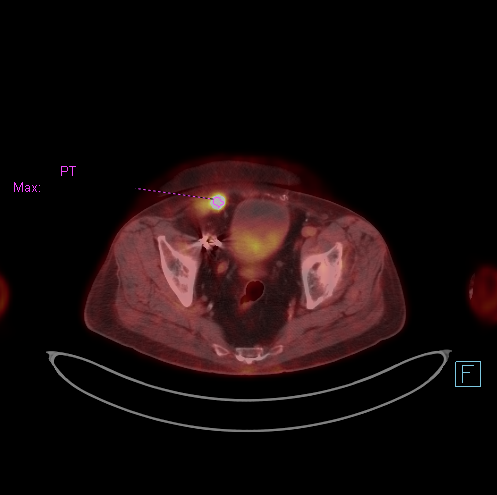

[4 of 4 positions shown; findings below may reference images not displayed]

FINDINGS: Mediastinal blood pool activity: SUV max

NECK:

No hypermetabolic cervical lymph nodes are identified.There are no
lesions of the pharyngeal mucosal space. There is marked focal
hypermetabolic activity associated with the superior aspect the left
ear (SUV max 10.4). There is soft tissue thickening in this area on
the CT images suspicious for an infiltrating mass. Correlate
clinically.

Incidental CT findings: Bilateral carotid atherosclerosis.

CHEST:

There are no hypermetabolic mediastinal, hilar or axillary lymph
nodes. The lobulated nodule of concern along the diaphragmatic
surface of the left lower lobe measures 1.8 x 1.7 cm on image 39/8
and is hypermetabolic with an SUV max of 4.4. No other
hypermetabolic or suspicious pulmonary nodules are identified.

Incidental CT findings: Right IJ hemodialysis catheters extend into
the right atrium. There is associated hypermetabolic activity at the
tip of the catheter attributed to the injection. There is diffuse
atherosclerosis of the aorta, great vessels and coronary arteries
status post median sternotomy and CABG. Small
right-greater-than-left pleural effusions are present without
associated hypermetabolic activity. Grossly stable chronic
interstitial lung disease with scattered subpleural reticulation.

ABDOMEN/PELVIS:

There is no hypermetabolic activity within the liver, adrenal
glands, spleen or pancreas. There is no hypermetabolic nodal
activity. Focal hypermetabolic activity within the right lower
quadrant is attributed to collecting system activity within the
right renal transplant.

Incidental CT findings: Changes of cirrhosis and multiple calcified
gallstones are again noted. A small amount of ascites has improved.
The native kidneys are severely atrophied with diffuse cortical
thinning. Transplant kidney in the right lower quadrant is unchanged
in appearance. Dense calcification in the left iliac fossa is
attributed to an old left renal transplant. Left scrotal hydrocele
and diffuse vascular calcifications are noted.

SKELETON:

There is no hypermetabolic activity to suggest osseous metastatic
disease. There is focal hypermetabolic activity associated with the
recently demonstrated fractures of the right 8 through 10th ribs.
There is arthropathic activity in the shoulders, right greater than
left.

Incidental CT findings: none
IMPRESSION: 1. The lobulated left lower lobe pulmonary nodule is hypermetabolic,
suspicious for possible malignancy (primary or metastatic).
2. Intense hypermetabolic activity associated with the superior
aspect of the left ear associated with soft tissue thickening on the
CT, suspicious for neoplasm. Correlate clinically. Inflammation and
urine contamination are considered less likely.
3. No other evidence of malignancy.
4. Multiple incidental findings as detailed above, not significantly
changed from recent diagnostic CTs.
